# Patient Record
Sex: Male | Born: 1950
Health system: Southern US, Community
[De-identification: ages and names within clinical notes are randomized; demographics above are authoritative.]

## PROBLEM LIST (undated history)

## (undated) DIAGNOSIS — I2510 Atherosclerotic heart disease of native coronary artery without angina pectoris: Secondary | ICD-10-CM

## (undated) DIAGNOSIS — H35039 Hypertensive retinopathy, unspecified eye: Secondary | ICD-10-CM

## (undated) DIAGNOSIS — I10 Essential (primary) hypertension: Secondary | ICD-10-CM

## (undated) DIAGNOSIS — R0600 Dyspnea, unspecified: Secondary | ICD-10-CM

## (undated) DIAGNOSIS — C801 Malignant (primary) neoplasm, unspecified: Secondary | ICD-10-CM

## (undated) DIAGNOSIS — J189 Pneumonia, unspecified organism: Secondary | ICD-10-CM

## (undated) DIAGNOSIS — Z87442 Personal history of urinary calculi: Secondary | ICD-10-CM

## (undated) DIAGNOSIS — M1990 Unspecified osteoarthritis, unspecified site: Secondary | ICD-10-CM

## (undated) DIAGNOSIS — F419 Anxiety disorder, unspecified: Secondary | ICD-10-CM

## (undated) DIAGNOSIS — E119 Type 2 diabetes mellitus without complications: Secondary | ICD-10-CM

## (undated) DIAGNOSIS — I219 Acute myocardial infarction, unspecified: Secondary | ICD-10-CM

## (undated) HISTORY — DX: Unspecified osteoarthritis, unspecified site: M19.90

## (undated) HISTORY — PX: EYE SURGERY: SHX253

## (undated) HISTORY — PX: CATARACT EXTRACTION: SUR2

## (undated) HISTORY — PX: CHOLECYSTECTOMY: SHX55

## (undated) HISTORY — PX: OTHER SURGICAL HISTORY: SHX169

## (undated) HISTORY — DX: Type 2 diabetes mellitus without complications: E11.9

## (undated) HISTORY — DX: Hypertensive retinopathy, unspecified eye: H35.039

## (undated) HISTORY — PX: GALLBLADDER SURGERY: SHX652

## (undated) HISTORY — DX: Anxiety disorder, unspecified: F41.9

## (undated) HISTORY — PX: FOOT SURGERY: SHX648

## (undated) HISTORY — DX: Essential (primary) hypertension: I10

## (undated) SURGERY — AMPUTATION, FOOT, PARTIAL
Anesthesia: Monitor Anesthesia Care | Site: Foot | Laterality: Left

---

## 2006-01-21 ENCOUNTER — Ambulatory Visit: Payer: Self-pay | Admitting: Family Medicine

## 2006-02-04 DIAGNOSIS — I219 Acute myocardial infarction, unspecified: Secondary | ICD-10-CM

## 2006-02-04 HISTORY — PX: CORONARY ANGIOPLASTY WITH STENT PLACEMENT: SHX49

## 2006-02-04 HISTORY — DX: Acute myocardial infarction, unspecified: I21.9

## 2006-07-04 ENCOUNTER — Ambulatory Visit: Payer: Self-pay

## 2006-10-23 ENCOUNTER — Other Ambulatory Visit: Payer: Self-pay

## 2006-10-23 ENCOUNTER — Inpatient Hospital Stay: Payer: Self-pay | Admitting: Internal Medicine

## 2006-10-24 ENCOUNTER — Other Ambulatory Visit: Payer: Self-pay

## 2008-06-22 ENCOUNTER — Other Ambulatory Visit: Payer: Self-pay

## 2009-05-10 ENCOUNTER — Ambulatory Visit: Payer: Self-pay | Admitting: Cardiology

## 2010-01-03 ENCOUNTER — Ambulatory Visit: Payer: Self-pay | Admitting: General Practice

## 2010-08-23 ENCOUNTER — Ambulatory Visit: Payer: Self-pay | Admitting: Anesthesiology

## 2011-04-24 ENCOUNTER — Ambulatory Visit: Payer: Self-pay | Admitting: Family Medicine

## 2011-06-17 ENCOUNTER — Ambulatory Visit: Payer: Self-pay | Admitting: Orthopedic Surgery

## 2011-06-20 ENCOUNTER — Ambulatory Visit: Payer: Self-pay | Admitting: Orthopedic Surgery

## 2011-06-20 LAB — CBC WITH DIFFERENTIAL/PLATELET
Basophil #: 0.1 10*3/uL (ref 0.0–0.1)
Basophil %: 0.9 %
Eosinophil #: 0.3 10*3/uL (ref 0.0–0.7)
Eosinophil %: 4.5 %
HCT: 42.8 % (ref 40.0–52.0)
HGB: 14.9 g/dL (ref 13.0–18.0)
Lymphocyte #: 2 10*3/uL (ref 1.0–3.6)
Lymphocyte %: 35.9 %
MCH: 32.5 pg (ref 26.0–34.0)
MCHC: 34.8 g/dL (ref 32.0–36.0)
MCV: 93 fL (ref 80–100)
Monocyte #: 0.6 x10 3/mm (ref 0.2–1.0)
Monocyte %: 10.6 %
Neutrophil #: 2.7 10*3/uL (ref 1.4–6.5)
Neutrophil %: 48.1 %
Platelet: 170 10*3/uL (ref 150–440)
RBC: 4.58 10*6/uL (ref 4.40–5.90)
RDW: 13.2 % (ref 11.5–14.5)
WBC: 5.6 10*3/uL (ref 3.8–10.6)

## 2011-06-20 LAB — BASIC METABOLIC PANEL
Anion Gap: 7 (ref 7–16)
BUN: 12 mg/dL (ref 7–18)
Calcium, Total: 8.8 mg/dL (ref 8.5–10.1)
Chloride: 102 mmol/L (ref 98–107)
Co2: 30 mmol/L (ref 21–32)
Creatinine: 0.96 mg/dL (ref 0.60–1.30)
EGFR (African American): >60
EGFR (Non-African Amer.): >60
Glucose: 241 mg/dL — ABNORMAL HIGH (ref 65–99)
Osmolality: 285 (ref 275–301)
Potassium: 4.5 mmol/L (ref 3.5–5.1)
Sodium: 139 mmol/L (ref 136–145)

## 2011-06-27 ENCOUNTER — Ambulatory Visit: Payer: Self-pay | Admitting: Orthopedic Surgery

## 2011-07-22 ENCOUNTER — Encounter: Payer: Self-pay | Admitting: Orthopedic Surgery

## 2011-08-05 ENCOUNTER — Encounter: Payer: Self-pay | Admitting: Orthopedic Surgery

## 2011-09-19 ENCOUNTER — Other Ambulatory Visit: Payer: Self-pay

## 2011-09-19 LAB — LIPID PANEL
Cholesterol: 165 mg/dL (ref 0–200)
HDL Cholesterol: 33 mg/dL — ABNORMAL LOW (ref 40–60)
Ldl Cholesterol, Calc: 79 mg/dL (ref 0–100)
Triglycerides: 265 mg/dL — ABNORMAL HIGH (ref 0–200)
VLDL Cholesterol, Calc: 53 mg/dL — ABNORMAL HIGH (ref 5–40)

## 2011-09-19 LAB — COMPREHENSIVE METABOLIC PANEL
Albumin: 3.9 g/dL (ref 3.4–5.0)
Alkaline Phosphatase: 64 U/L (ref 50–136)
Anion Gap: 9 (ref 7–16)
BUN: 14 mg/dL (ref 7–18)
Bilirubin,Total: 0.5 mg/dL (ref 0.2–1.0)
Calcium, Total: 8.8 mg/dL (ref 8.5–10.1)
Chloride: 101 mmol/L (ref 98–107)
Co2: 26 mmol/L (ref 21–32)
Creatinine: 0.91 mg/dL (ref 0.60–1.30)
EGFR (African American): >60
EGFR (Non-African Amer.): >60
Glucose: 185 mg/dL — ABNORMAL HIGH (ref 65–99)
Osmolality: 277 (ref 275–301)
Potassium: 3.7 mmol/L (ref 3.5–5.1)
SGOT(AST): 30 U/L (ref 15–37)
SGPT (ALT): 53 U/L (ref 12–78)
Sodium: 136 mmol/L (ref 136–145)
Total Protein: 7 g/dL (ref 6.4–8.2)

## 2011-09-19 LAB — HEMOGLOBIN A1C: Hemoglobin A1C: 8.3 % — ABNORMAL HIGH (ref 4.2–6.3)

## 2012-07-27 ENCOUNTER — Other Ambulatory Visit: Payer: Self-pay | Admitting: Family Medicine

## 2012-07-27 LAB — LIPID PANEL
Cholesterol: 153 mg/dL (ref 0–200)
HDL Cholesterol: 34 mg/dL — ABNORMAL LOW (ref 40–60)
Ldl Cholesterol, Calc: 77 mg/dL (ref 0–100)
Triglycerides: 212 mg/dL — ABNORMAL HIGH (ref 0–200)
VLDL Cholesterol, Calc: 42 mg/dL — ABNORMAL HIGH (ref 5–40)

## 2012-07-27 LAB — HEMOGLOBIN A1C: Hemoglobin A1C: 7.6 % — ABNORMAL HIGH (ref 4.2–6.3)

## 2012-11-17 ENCOUNTER — Encounter: Payer: Self-pay | Admitting: *Deleted

## 2012-11-19 ENCOUNTER — Ambulatory Visit (INDEPENDENT_AMBULATORY_CARE_PROVIDER_SITE_OTHER): Payer: BC Managed Care – PPO

## 2012-11-19 ENCOUNTER — Encounter: Payer: Self-pay | Admitting: Podiatry

## 2012-11-19 ENCOUNTER — Ambulatory Visit (INDEPENDENT_AMBULATORY_CARE_PROVIDER_SITE_OTHER): Payer: BC Managed Care – PPO | Admitting: Podiatry

## 2012-11-19 VITALS — BP 145/84 | HR 63 | Resp 16 | Ht 75.0 in | Wt 270.0 lb

## 2012-11-19 DIAGNOSIS — M2030 Hallux varus (acquired), unspecified foot: Secondary | ICD-10-CM

## 2012-11-19 DIAGNOSIS — M2040 Other hammer toe(s) (acquired), unspecified foot: Secondary | ICD-10-CM

## 2012-11-19 DIAGNOSIS — M2032 Hallux varus (acquired), left foot: Secondary | ICD-10-CM

## 2012-11-19 NOTE — Progress Notes (Signed)
Kevin Perry presents today for followup of his surgical foot left date of surgery was 09/04/2012. He states he is doing quite well he is very happy with the outcome. It is able to work with no problems.  Objective: Vital signs are stable he is alert and oriented x3 pulses are palpable left lower extremity. Moderate edema to the left foot. Surgical sites evolving heel. He does have some medial deviation of the second PIPJ resulting in adduction of the second toe toward the hallux. Radiographic evaluation confirms nonunion at the PIPJ arthrodesis site.  Assessment: Well-healing surgical foot left.  Plan: Followup with him in one month for another set of x-rays.

## 2012-12-16 ENCOUNTER — Encounter: Payer: BC Managed Care – PPO | Admitting: Podiatry

## 2012-12-30 ENCOUNTER — Encounter: Payer: BC Managed Care – PPO | Admitting: Podiatry

## 2013-01-13 ENCOUNTER — Ambulatory Visit (INDEPENDENT_AMBULATORY_CARE_PROVIDER_SITE_OTHER): Payer: BC Managed Care – PPO

## 2013-01-13 ENCOUNTER — Ambulatory Visit (INDEPENDENT_AMBULATORY_CARE_PROVIDER_SITE_OTHER): Payer: BC Managed Care – PPO | Admitting: Podiatry

## 2013-01-13 ENCOUNTER — Encounter: Payer: Self-pay | Admitting: Podiatry

## 2013-01-13 VITALS — BP 154/85 | HR 75 | Resp 16 | Ht 75.0 in | Wt 270.0 lb

## 2013-01-13 DIAGNOSIS — Z9889 Other specified postprocedural states: Secondary | ICD-10-CM

## 2013-01-13 NOTE — Progress Notes (Signed)
Kevin Perry presents today for followup of his hallux IPJ fusion left foot as well as hammertoe repairs. He states that they're doing absolutely great he has no problems whatsoever he is concerned about a discoloration the hallux nail left.  Objective: Vital signs are stable he is alert and oriented x3. He has a subungual hematoma to the hallux nail left. Much decrease in edema to the left foot since I saw him last. Radiographic evaluation demonstrates well-healed IPJ fusion left foot.  Assessment: Dermatitis bilateral legs. Well-healing surgical foot left. Subungual hematoma left.  Plan: Debridement of subungual hematoma and nail plate left. Followup with him on an as-needed basis.

## 2014-05-29 NOTE — Op Note (Signed)
PATIENT NAME:  Kevin Perry, Kevin Perry MR#:  [id number] DATE OF BIRTH:  [birth year]  DATE OF PROCEDURE:  06/27/2011  PREOPERATIVE DIAGNOSES:  1. Osteoarthritis.  2. Medial meniscus tear, right knee.   POSTOPERATIVE DIAGNOSIS:  1. Osteoarthritis. 2. Medial meniscus tear, right knee. 3. Lateral meniscus tear.   PROCEDURE: Arthroscopy, partial medial and lateral meniscectomy.   SURGEON: Laurene Footman, MD   ANESTHESIA: General.   DESCRIPTION OF PROCEDURE: The patient was brought to the Operating Room, and after adequate anesthesia was obtained the leg was prepped and draped in the usual sterile fashion with the arthroscopic legholder and tourniquet applied. After appropriate patient identification and timeout procedures were completed, the knee was approached through an inferolateral portal initially and the arthroscope was introduced. Initial inspection revealed normal-appearing patellofemoral tracking with moderate degenerative changes to both the femoral trochlea and patella with moderate synovitis as well anteriorly. This was subsequently resected to aid in visualization. The gutters were checked and there were no loose bodies. Coming around medially, an inferomedial portal was made and a probe introduced. Inspection revealed a complex tear of the posterior horn of the medial meniscus with a predominantly horizontal component. The articular cartilage all had grade 2 and 3 changes but no bone and no fissuring down to bone. The anterior cruciate ligament was noted to be intact. In the lateral compartment, the articular cartilage was relatively spared with just some grade 1 and 2 changes, and there was a tear of the posterior horn, degenerative type tear to the inner third of the posterior and middle thirds. An ArthroCare wand was used to address the lateral meniscus tear medially. A shaver was used to resect some of the synovitis for better visualization as well as a large portion of the posterior horn  tear. On probing, there was found to be a piece of meniscus approximately 1 cm to 1.5 cm in length that  had been flipped under the meniscus. It had a rounded stump consistent with a long-term tear that was probably displaceable in the knee. This was resected with the shaver, and on further probing there were no more large tears. The ArthroCare wand was used to smooth the edges. Most of the posterior horn was resected to get back to a stable margin. The knee was then thoroughly irrigated until clear. All instrumentation was withdrawn. Xeroform, 4 x 4s, Webril, and Ace wrap were applied after infiltration of a total of 20 mL of 0.5% Sensorcaine with epi into the area of the portals.   ESTIMATED BLOOD LOSS: Minimal.   COMPLICATIONS: None.   SPECIMEN: None.    ____________________________ Laurene Footman, MD mjm:cbb D: 06/27/2011 21:11:51 ET T: 06/28/2011 11:09:47 ET JOB#: [id number]  cc: Laurene Footman, MD, <Dictator> Laurene Footman MD ELECTRONICALLY SIGNED 06/28/2011 12:34

## 2014-07-07 ENCOUNTER — Telehealth: Payer: Self-pay

## 2014-07-07 NOTE — Telephone Encounter (Signed)
Printed labs and notified patient. Kevin Perry CMA

## 2014-07-07 NOTE — Telephone Encounter (Signed)
-----   Message from Roselee Nova, MD sent at 07/06/2014  6:34 PM EDT ----- Regarding: Lab results. CMP is normal.  FLP is at goal except for HDl, which is below normal at 37.  HgbA1c is elevated at 7.5%. He should improve his dietary and lifestyle factors and repeat A1c in 3 months.

## 2014-07-12 ENCOUNTER — Other Ambulatory Visit: Payer: Self-pay | Admitting: Orthopedic Surgery

## 2014-07-12 DIAGNOSIS — M1711 Unilateral primary osteoarthritis, right knee: Secondary | ICD-10-CM

## 2014-07-18 ENCOUNTER — Ambulatory Visit
Admission: RE | Admit: 2014-07-18 | Discharge: 2014-07-18 | Disposition: A | Discharge status: Home / Self Care | Payer: Managed Care, Other (non HMO) | Source: Ambulatory Visit | Attending: Orthopedic Surgery | Admitting: Orthopedic Surgery

## 2014-07-18 DIAGNOSIS — M25761 Osteophyte, right knee: Secondary | ICD-10-CM | POA: Insufficient documentation

## 2014-07-18 DIAGNOSIS — M25461 Effusion, right knee: Secondary | ICD-10-CM | POA: Insufficient documentation

## 2014-07-18 DIAGNOSIS — M1711 Unilateral primary osteoarthritis, right knee: Secondary | ICD-10-CM | POA: Diagnosis present

## 2014-08-17 ENCOUNTER — Encounter
Admission: RE | Admit: 2014-08-17 | Discharge: 2014-08-17 | Disposition: A | Discharge status: Home / Self Care | Payer: Managed Care, Other (non HMO) | Source: Ambulatory Visit | Attending: Orthopedic Surgery | Admitting: Orthopedic Surgery

## 2014-08-17 ENCOUNTER — Other Ambulatory Visit: Payer: Managed Care, Other (non HMO)

## 2014-08-17 DIAGNOSIS — E119 Type 2 diabetes mellitus without complications: Secondary | ICD-10-CM | POA: Diagnosis not present

## 2014-08-17 DIAGNOSIS — Z01810 Encounter for preprocedural cardiovascular examination: Secondary | ICD-10-CM | POA: Diagnosis present

## 2014-08-17 DIAGNOSIS — F419 Anxiety disorder, unspecified: Secondary | ICD-10-CM | POA: Diagnosis not present

## 2014-08-17 DIAGNOSIS — Z888 Allergy status to other drugs, medicaments and biological substances status: Secondary | ICD-10-CM | POA: Diagnosis not present

## 2014-08-17 DIAGNOSIS — M1990 Unspecified osteoarthritis, unspecified site: Secondary | ICD-10-CM | POA: Insufficient documentation

## 2014-08-17 DIAGNOSIS — I10 Essential (primary) hypertension: Secondary | ICD-10-CM | POA: Insufficient documentation

## 2014-08-17 DIAGNOSIS — I252 Old myocardial infarction: Secondary | ICD-10-CM | POA: Diagnosis not present

## 2014-08-17 DIAGNOSIS — Z01812 Encounter for preprocedural laboratory examination: Secondary | ICD-10-CM | POA: Diagnosis present

## 2014-08-17 HISTORY — DX: Acute myocardial infarction, unspecified: I21.9

## 2014-08-17 LAB — URINALYSIS COMPLETE WITH MICROSCOPIC (ARMC ONLY)
Bacteria, UA: NONE SEEN
Bilirubin Urine: NEGATIVE
Glucose, UA: NEGATIVE mg/dL
Hgb urine dipstick: NEGATIVE
Ketones, ur: NEGATIVE mg/dL
Leukocytes, UA: NEGATIVE
Nitrite: NEGATIVE
Protein, ur: NEGATIVE mg/dL
Specific Gravity, Urine: 1.016 (ref 1.005–1.030)
pH: 6 (ref 5.0–8.0)

## 2014-08-17 LAB — CBC
HCT: 43.3 % (ref 40.0–52.0)
Hemoglobin: 14.9 g/dL (ref 13.0–18.0)
MCH: 31.3 pg (ref 26.0–34.0)
MCHC: 34.5 g/dL (ref 32.0–36.0)
MCV: 90.5 fL (ref 80.0–100.0)
Platelets: 181 10*3/uL (ref 150–440)
RBC: 4.78 MIL/uL (ref 4.40–5.90)
RDW: 13 % (ref 11.5–14.5)
WBC: 7.6 10*3/uL (ref 3.8–10.6)

## 2014-08-17 LAB — APTT: aPTT: 27 seconds (ref 24–36)

## 2014-08-17 LAB — TYPE AND SCREEN
ABO/RH(D): A POS
Antibody Screen: NEGATIVE

## 2014-08-17 LAB — SEDIMENTATION RATE: Sed Rate: 6 mm/hr (ref 0–20)

## 2014-08-17 LAB — BASIC METABOLIC PANEL
Anion gap: 7 (ref 5–15)
BUN: 17 mg/dL (ref 6–20)
CO2: 28 mmol/L (ref 22–32)
Calcium: 9 mg/dL (ref 8.9–10.3)
Chloride: 102 mmol/L (ref 101–111)
Creatinine, Ser: 0.97 mg/dL (ref 0.61–1.24)
GFR calc Af Amer: >60 mL/min (ref >60)
GFR calc non Af Amer: >60 mL/min (ref >60)
Glucose, Bld: 164 mg/dL — ABNORMAL HIGH (ref 65–99)
Potassium: 3.7 mmol/L (ref 3.5–5.1)
Sodium: 137 mmol/L (ref 135–145)

## 2014-08-17 LAB — PROTIME-INR
INR: 0.96
Prothrombin Time: 13 seconds (ref 11.4–15.0)

## 2014-08-17 LAB — SURGICAL PCR SCREEN
MRSA, PCR: NEGATIVE
Staphylococcus aureus: POSITIVE — AB

## 2014-08-17 NOTE — Patient Instructions (Addendum)
  Your procedure is scheduled on: Tuesday August 30, 2014. Report to Same Day Surgery. To find out your arrival time please call [phone] between 1PM - 3PM on Monday August 29, 2014.  Remember: Instructions that are not followed completely may result in serious medical risk, up to and including death, or upon the discretion of your surgeon and anesthesiologist your surgery may need to be rescheduled.    __x__ 1. Do not eat food or drink liquids after midnight. No gum chewing or hard candies.     ____ 2. No Alcohol for 24 hours before or after surgery.   ____ 3. Bring all medications with you on the day of surgery if instructed.    __x__ 4. Notify your doctor if there is any change in your medical condition     (cold, fever, infections).     Do not wear jewelry, make-up, hairpins, clips or nail polish.  Do not wear lotions, powders, or perfumes. You may wear deodorant.  Do not shave 48 hours prior to surgery. Men may shave face and neck.  Do not bring valuables to the hospital.    [facility] is not responsible for any belongings or valuables.               Contacts, dentures or bridgework may not be worn into surgery.  Leave your suitcase in the car. After surgery it may be brought to your room.  For patients admitted to the hospital, discharge time is determined by your treatment team.   Patients discharged the day of surgery will not be allowed to drive home.    Please read over the following fact sheets that you were given:   [facility] Preparing for Surgery  __x__ Take these medicines the morning of surgery with A SIP OF WATER:    1. amLODipine (NORVASC)  2. citalopram (CELEXA)   3. lisinopril (PRINIVIL,ZESTRIL)  4. metoprolol succinate (TOPROL-XL  5.rosuvastatin (CRESTOR)     ____ Fleet Enema (as directed)   _x___ Use CHG Soap as directed  ____ Use inhalers on the day of surgery  ____ Stop metformin 2 days prior to surgery    ____ Take 1/2 of usual insulin dose  the night before surgery and none on the morning of surgery.   ____ Stop Coumadin/Plavix/aspirin on does NOT apply.  ____ Stop Anti-inflammatories on does NOT apply.  Tylenol or Hydrocodone is ok to take for pain.   ____ Stop supplements until after surgery.    ____ Bring C-Pap to the hospital.

## 2014-08-18 LAB — ABO/RH: ABO/RH(D): A POS

## 2014-08-18 NOTE — Pre-Procedure Instructions (Signed)
Phone call from Dr. [name] office nurse Margaretha Sheffield, no treatment ordered by Dr. Rudene Christians for + Staph Aureus.

## 2014-08-18 NOTE — Pre-Procedure Instructions (Signed)
Message left for nurse Margaretha Sheffield at Dr. Rudene Christians office regarding MRSA negative/ Staph aureus positive result and to see if additional treatment is indicated.

## 2014-08-30 ENCOUNTER — Inpatient Hospital Stay
Admission: RE | Admit: 2014-08-30 | Discharge: 2014-09-02 | DRG: 470 | Disposition: A | Discharge status: Home Health | Payer: Managed Care, Other (non HMO) | Source: Ambulatory Visit | Attending: Orthopedic Surgery | Admitting: Orthopedic Surgery

## 2014-08-30 ENCOUNTER — Encounter: Payer: Self-pay | Admitting: Anesthesiology

## 2014-08-30 ENCOUNTER — Inpatient Hospital Stay: Payer: Managed Care, Other (non HMO) | Admitting: Anesthesiology

## 2014-08-30 ENCOUNTER — Encounter
Admission: RE | Disposition: A | Discharge status: Home Health | Payer: Self-pay | Source: Ambulatory Visit | Attending: Orthopedic Surgery

## 2014-08-30 ENCOUNTER — Inpatient Hospital Stay: Payer: Managed Care, Other (non HMO)

## 2014-08-30 DIAGNOSIS — F329 Major depressive disorder, single episode, unspecified: Secondary | ICD-10-CM | POA: Diagnosis present

## 2014-08-30 DIAGNOSIS — Z955 Presence of coronary angioplasty implant and graft: Secondary | ICD-10-CM | POA: Diagnosis not present

## 2014-08-30 DIAGNOSIS — I252 Old myocardial infarction: Secondary | ICD-10-CM | POA: Diagnosis not present

## 2014-08-30 DIAGNOSIS — J302 Other seasonal allergic rhinitis: Secondary | ICD-10-CM | POA: Diagnosis present

## 2014-08-30 DIAGNOSIS — Z79891 Long term (current) use of opiate analgesic: Secondary | ICD-10-CM | POA: Diagnosis not present

## 2014-08-30 DIAGNOSIS — M1711 Unilateral primary osteoarthritis, right knee: Principal | ICD-10-CM | POA: Diagnosis present

## 2014-08-30 DIAGNOSIS — Z886 Allergy status to analgesic agent status: Secondary | ICD-10-CM | POA: Diagnosis not present

## 2014-08-30 DIAGNOSIS — F419 Anxiety disorder, unspecified: Secondary | ICD-10-CM | POA: Diagnosis present

## 2014-08-30 DIAGNOSIS — Z79899 Other long term (current) drug therapy: Secondary | ICD-10-CM

## 2014-08-30 DIAGNOSIS — G8918 Other acute postprocedural pain: Secondary | ICD-10-CM

## 2014-08-30 DIAGNOSIS — E119 Type 2 diabetes mellitus without complications: Secondary | ICD-10-CM | POA: Diagnosis present

## 2014-08-30 DIAGNOSIS — I10 Essential (primary) hypertension: Secondary | ICD-10-CM | POA: Diagnosis present

## 2014-08-30 DIAGNOSIS — M1710 Unilateral primary osteoarthritis, unspecified knee: Secondary | ICD-10-CM | POA: Diagnosis present

## 2014-08-30 HISTORY — PX: TOTAL KNEE ARTHROPLASTY: SHX125

## 2014-08-30 LAB — PROTIME-INR
INR: 0.95
Prothrombin Time: 12.9 seconds (ref 11.4–15.0)

## 2014-08-30 LAB — CBC
HCT: 39.8 % — ABNORMAL LOW (ref 40.0–52.0)
Hemoglobin: 13.6 g/dL (ref 13.0–18.0)
MCH: 31.2 pg (ref 26.0–34.0)
MCHC: 34.1 g/dL (ref 32.0–36.0)
MCV: 91.4 fL (ref 80.0–100.0)
Platelets: 150 10*3/uL (ref 150–440)
RBC: 4.35 MIL/uL — ABNORMAL LOW (ref 4.40–5.90)
RDW: 13.7 % (ref 11.5–14.5)
WBC: 7.4 10*3/uL (ref 3.8–10.6)

## 2014-08-30 LAB — CREATININE, SERUM
Creatinine, Ser: 0.85 mg/dL (ref 0.61–1.24)
GFR calc Af Amer: >60 mL/min (ref >60)
GFR calc non Af Amer: >60 mL/min (ref >60)

## 2014-08-30 LAB — GLUCOSE, CAPILLARY: Glucose-Capillary: 182 mg/dL — ABNORMAL HIGH (ref 65–99)

## 2014-08-30 SURGERY — ARTHROPLASTY, KNEE, TOTAL
Anesthesia: Spinal | Site: Knee | Laterality: Right | Wound class: Clean

## 2014-08-30 MED ORDER — BUPIVACAINE HCL (PF) 0.5 % IJ SOLN
INTRAMUSCULAR | Status: DC | PRN
  Administered 2014-08-30: 3 mL

## 2014-08-30 MED ORDER — FAMOTIDINE 20 MG PO TABS
ORAL_TABLET | ORAL | Status: AC
  Administered 2014-08-30: 20 mg via ORAL
  Filled 2014-08-30: qty 1

## 2014-08-30 MED ORDER — EPHEDRINE SULFATE 50 MG/ML IJ SOLN
INTRAMUSCULAR | Status: DC | PRN
  Administered 2014-08-30: 10 mg via INTRAVENOUS

## 2014-08-30 MED ORDER — ONDANSETRON HCL 4 MG PO TABS: 4.0000 mg | ORAL_TABLET | Freq: Four times a day (QID) | ORAL | Status: DC | PRN

## 2014-08-30 MED ORDER — METOCLOPRAMIDE HCL 5 MG/ML IJ SOLN: 5.0000 mg | Freq: Three times a day (TID) | INTRAMUSCULAR | Status: DC | PRN

## 2014-08-30 MED ORDER — MENTHOL 3 MG MT LOZG: 1.0000 | LOZENGE | OROMUCOSAL | Status: DC | PRN

## 2014-08-30 MED ORDER — NEOMYCIN-POLYMYXIN B GU 40-200000 IR SOLN
Status: DC | PRN
  Administered 2014-08-30: 16 mL

## 2014-08-30 MED ORDER — CITALOPRAM HYDROBROMIDE 20 MG PO TABS
20.0000 mg | ORAL_TABLET | ORAL | Status: DC
  Administered 2014-08-31 – 2014-09-02 (×3): 20 mg via ORAL
  Filled 2014-08-30 (×4): qty 1

## 2014-08-30 MED ORDER — MORPHINE (PF) INJECTION FOR INHALATION 10 MG/ML
RESPIRATORY_TRACT | Status: DC | PRN
  Administered 2014-08-30: 10 mg via RESPIRATORY_TRACT

## 2014-08-30 MED ORDER — SODIUM CHLORIDE 0.9 % IJ SOLN
INTRAMUSCULAR | Status: AC
  Filled 2014-08-30: qty 100

## 2014-08-30 MED ORDER — HYDROCODONE-ACETAMINOPHEN 10-325 MG PO TABS
1.0000 | ORAL_TABLET | Freq: Four times a day (QID) | ORAL | Status: DC | PRN
  Administered 2014-08-30: 1 via ORAL
  Filled 2014-08-30: qty 1

## 2014-08-30 MED ORDER — PROPOFOL 10 MG/ML IV BOLUS
INTRAVENOUS | Status: DC | PRN
  Administered 2014-08-30: 70 ug via INTRAVENOUS

## 2014-08-30 MED ORDER — HYDROMORPHONE HCL 1 MG/ML IJ SOLN
0.5000 mg | Freq: Once | INTRAMUSCULAR | Status: AC
  Administered 2014-08-30: 0.5 mg via INTRAVENOUS
  Filled 2014-08-30: qty 1

## 2014-08-30 MED ORDER — METHOCARBAMOL 1000 MG/10ML IJ SOLN
500.0000 mg | Freq: Four times a day (QID) | INTRAVENOUS | Status: DC | PRN
  Filled 2014-08-30: qty 5

## 2014-08-30 MED ORDER — METHOCARBAMOL 500 MG PO TABS
500.0000 mg | ORAL_TABLET | Freq: Four times a day (QID) | ORAL | Status: DC | PRN
  Administered 2014-08-30 – 2014-09-02 (×2): 500 mg via ORAL
  Filled 2014-08-30 (×2): qty 1

## 2014-08-30 MED ORDER — PROPOFOL INFUSION 10 MG/ML OPTIME
INTRAVENOUS | Status: DC | PRN
  Administered 2014-08-30: 100 ug/kg/min via INTRAVENOUS

## 2014-08-30 MED ORDER — ONDANSETRON HCL 4 MG/2ML IJ SOLN: 4.0000 mg | Freq: Once | INTRAMUSCULAR | Status: DC | PRN

## 2014-08-30 MED ORDER — DOCUSATE SODIUM 100 MG PO CAPS
100.0000 mg | ORAL_CAPSULE | Freq: Two times a day (BID) | ORAL | Status: DC
  Administered 2014-08-30 – 2014-09-01 (×6): 100 mg via ORAL
  Filled 2014-08-30 (×6): qty 1

## 2014-08-30 MED ORDER — ACETAMINOPHEN 325 MG PO TABS: 650.0000 mg | ORAL_TABLET | Freq: Four times a day (QID) | ORAL | Status: DC | PRN

## 2014-08-30 MED ORDER — FENTANYL CITRATE (PF) 100 MCG/2ML IJ SOLN
INTRAMUSCULAR | Status: DC | PRN
  Administered 2014-08-30: 100 ug via INTRAVENOUS

## 2014-08-30 MED ORDER — SODIUM CHLORIDE 0.9 % IV SOLN
INTRAVENOUS | Status: DC
Start: 2014-08-30 — End: 2014-08-30
  Administered 2014-08-30 (×3): via INTRAVENOUS

## 2014-08-30 MED ORDER — LINAGLIPTIN 5 MG PO TABS
5.0000 mg | ORAL_TABLET | Freq: Every day | ORAL | Status: DC
  Administered 2014-08-31 – 2014-09-01 (×2): 5 mg via ORAL
  Filled 2014-08-30 (×2): qty 1

## 2014-08-30 MED ORDER — ALUM & MAG HYDROXIDE-SIMETH 200-200-20 MG/5ML PO SUSP: 30.0000 mL | ORAL | Status: DC | PRN

## 2014-08-30 MED ORDER — CEFAZOLIN SODIUM 10 G IJ SOLR
3.0000 g | Freq: Once | INTRAMUSCULAR | Status: AC
  Administered 2014-08-30: 3 g via INTRAVENOUS
  Filled 2014-08-30: qty 3000

## 2014-08-30 MED ORDER — FENTANYL CITRATE (PF) 100 MCG/2ML IJ SOLN: 25.0000 ug | INTRAMUSCULAR | Status: DC | PRN

## 2014-08-30 MED ORDER — TRANEXAMIC ACID 1000 MG/10ML IV SOLN
INTRAVENOUS | Status: AC
  Filled 2014-08-30: qty 10

## 2014-08-30 MED ORDER — FUROSEMIDE 40 MG PO TABS
40.0000 mg | ORAL_TABLET | ORAL | Status: DC
  Administered 2014-08-30 – 2014-09-02 (×4): 40 mg via ORAL
  Filled 2014-08-30 (×5): qty 1

## 2014-08-30 MED ORDER — ZOLPIDEM TARTRATE 5 MG PO TABS: 5.0000 mg | ORAL_TABLET | Freq: Every evening | ORAL | Status: DC | PRN

## 2014-08-30 MED ORDER — ROSUVASTATIN CALCIUM 10 MG PO TABS
10.0000 mg | ORAL_TABLET | ORAL | Status: DC
  Administered 2014-08-31 – 2014-09-02 (×3): 10 mg via ORAL
  Filled 2014-08-30 (×4): qty 1

## 2014-08-30 MED ORDER — ONDANSETRON HCL 4 MG/2ML IJ SOLN: 4.0000 mg | Freq: Four times a day (QID) | INTRAMUSCULAR | Status: DC | PRN

## 2014-08-30 MED ORDER — BISACODYL 10 MG RE SUPP: 10.0000 mg | Freq: Every day | RECTAL | Status: DC | PRN

## 2014-08-30 MED ORDER — OXYCODONE HCL 5 MG PO TABS
5.0000 mg | ORAL_TABLET | ORAL | Status: DC | PRN
  Administered 2014-08-30 (×2): 5 mg via ORAL
  Administered 2014-08-30 – 2014-09-02 (×8): 10 mg via ORAL
  Filled 2014-08-30 (×2): qty 1
  Filled 2014-08-30 (×9): qty 2

## 2014-08-30 MED ORDER — ENOXAPARIN SODIUM 30 MG/0.3ML ~~LOC~~ SOLN
30.0000 mg | Freq: Two times a day (BID) | SUBCUTANEOUS | Status: DC
  Administered 2014-08-31 – 2014-09-02 (×5): 30 mg via SUBCUTANEOUS
  Filled 2014-08-30 (×6): qty 0.3

## 2014-08-30 MED ORDER — BUPIVACAINE-EPINEPHRINE (PF) 0.25% -1:200000 IJ SOLN
INTRAMUSCULAR | Status: DC | PRN
  Administered 2014-08-30: 30 mL via PERINEURAL

## 2014-08-30 MED ORDER — FAMOTIDINE 20 MG PO TABS
20.0000 mg | ORAL_TABLET | Freq: Once | ORAL | Status: AC
  Administered 2014-08-30: 20 mg via ORAL

## 2014-08-30 MED ORDER — SODIUM CHLORIDE 0.9 % IV SOLN
INTRAVENOUS | Status: DC | PRN
  Administered 2014-08-30: 60 mL

## 2014-08-30 MED ORDER — NEOMYCIN-POLYMYXIN B GU 40-200000 IR SOLN
Status: AC
  Filled 2014-08-30: qty 20

## 2014-08-30 MED ORDER — AMLODIPINE BESYLATE 10 MG PO TABS
10.0000 mg | ORAL_TABLET | ORAL | Status: DC
  Administered 2014-08-31 – 2014-09-02 (×3): 10 mg via ORAL
  Filled 2014-08-30 (×4): qty 1

## 2014-08-30 MED ORDER — SODIUM CHLORIDE 0.9 % IJ SOLN
INTRAMUSCULAR | Status: AC
  Administered 2014-08-30: 10 mL
  Filled 2014-08-30: qty 3

## 2014-08-30 MED ORDER — LISINOPRIL 20 MG PO TABS
20.0000 mg | ORAL_TABLET | ORAL | Status: DC
Start: 2014-08-30 — End: 2014-09-02
  Administered 2014-08-31 – 2014-09-02 (×3): 20 mg via ORAL
  Filled 2014-08-30 (×4): qty 1

## 2014-08-30 MED ORDER — MAGNESIUM HYDROXIDE 400 MG/5ML PO SUSP
30.0000 mL | Freq: Every day | ORAL | Status: DC | PRN
  Administered 2014-09-01: 30 mL via ORAL
  Filled 2014-08-30: qty 30

## 2014-08-30 MED ORDER — PHENOL 1.4 % MT LIQD: 1.0000 | OROMUCOSAL | Status: DC | PRN

## 2014-08-30 MED ORDER — MAGNESIUM CITRATE PO SOLN: 1.0000 | Freq: Once | ORAL | Status: AC | PRN

## 2014-08-30 MED ORDER — ACETAMINOPHEN 650 MG RE SUPP: 650.0000 mg | Freq: Four times a day (QID) | RECTAL | Status: DC | PRN

## 2014-08-30 MED ORDER — DIPHENHYDRAMINE HCL 12.5 MG/5ML PO ELIX: 12.5000 mg | ORAL_SOLUTION | ORAL | Status: DC | PRN

## 2014-08-30 MED ORDER — LORATADINE 10 MG PO TABS
10.0000 mg | ORAL_TABLET | ORAL | Status: DC
  Administered 2014-08-31 – 2014-09-02 (×3): 10 mg via ORAL
  Filled 2014-08-30 (×4): qty 1

## 2014-08-30 MED ORDER — MIDAZOLAM HCL 2 MG/2ML IJ SOLN
INTRAMUSCULAR | Status: DC | PRN
  Administered 2014-08-30: 2 mg via INTRAVENOUS

## 2014-08-30 MED ORDER — MORPHINE SULFATE 2 MG/ML IJ SOLN
2.0000 mg | INTRAMUSCULAR | Status: DC | PRN
  Administered 2014-08-30 (×6): 2 mg via INTRAVENOUS
  Filled 2014-08-30 (×6): qty 1

## 2014-08-30 MED ORDER — ACETAMINOPHEN 10 MG/ML IV SOLN
INTRAVENOUS | Status: AC
  Filled 2014-08-30: qty 100

## 2014-08-30 MED ORDER — GLIPIZIDE 5 MG PO TABS
10.0000 mg | ORAL_TABLET | Freq: Two times a day (BID) | ORAL | Status: DC
  Administered 2014-08-30 – 2014-09-02 (×6): 10 mg via ORAL
  Filled 2014-08-30 (×6): qty 2

## 2014-08-30 MED ORDER — CEFAZOLIN SODIUM-DEXTROSE 2-3 GM-% IV SOLR
2.0000 g | Freq: Four times a day (QID) | INTRAVENOUS | Status: AC
  Administered 2014-08-30 (×3): 2 g via INTRAVENOUS
  Filled 2014-08-30 (×3): qty 50

## 2014-08-30 MED ORDER — SODIUM CHLORIDE 0.9 % IV SOLN
INTRAVENOUS | Status: DC
  Administered 2014-08-30 – 2014-08-31 (×3): via INTRAVENOUS

## 2014-08-30 MED ORDER — MORPHINE SULFATE 10 MG/ML IJ SOLN
INTRAMUSCULAR | Status: AC
  Filled 2014-08-30: qty 1

## 2014-08-30 MED ORDER — METOCLOPRAMIDE HCL 10 MG PO TABS: 5.0000 mg | ORAL_TABLET | Freq: Three times a day (TID) | ORAL | Status: DC | PRN

## 2014-08-30 MED ORDER — BUPIVACAINE LIPOSOME 1.3 % IJ SUSP
INTRAMUSCULAR | Status: AC
  Filled 2014-08-30: qty 20

## 2014-08-30 MED ORDER — BUPIVACAINE-EPINEPHRINE (PF) 0.25% -1:200000 IJ SOLN
INTRAMUSCULAR | Status: AC
  Filled 2014-08-30: qty 30

## 2014-08-30 MED ORDER — METOPROLOL SUCCINATE ER 50 MG PO TB24
50.0000 mg | ORAL_TABLET | ORAL | Status: DC
  Administered 2014-08-31 – 2014-09-02 (×3): 50 mg via ORAL
  Filled 2014-08-30 (×4): qty 1

## 2014-08-30 MED ORDER — ACETAMINOPHEN 10 MG/ML IV SOLN
INTRAVENOUS | Status: DC | PRN
Start: 2014-08-30 — End: 2014-08-30
  Administered 2014-08-30: 1000 mg via INTRAVENOUS

## 2014-08-30 SURGICAL SUPPLY — 55 items
BANDAGE ELASTIC 6 CLIP ST LF (GAUZE/BANDAGES/DRESSINGS) ×2 IMPLANT
BLADE SAW 1 (BLADE) ×2 IMPLANT
BLOCK CUTTING FEMUR 6 RT (MISCELLANEOUS) IMPLANT
BONE CEMENT GENTAMICIN (Cement) ×4 IMPLANT
CANISTER SUCT 1200ML W/VALVE (MISCELLANEOUS) ×2 IMPLANT
CANISTER SUCT 3000ML (MISCELLANEOUS) ×4 IMPLANT
CAPT KNEE TOTAL 3 ×2 IMPLANT
CATH FOL LEG HOLDER (MISCELLANEOUS) ×2 IMPLANT
CATH TRAY 16F METER LATEX (MISCELLANEOUS) ×2 IMPLANT
CEMENT BONE GENTAMICIN 40 (Cement) ×2 IMPLANT
CHLORAPREP W/TINT 26ML (MISCELLANEOUS) ×4 IMPLANT
COOLER POLAR GLACIER W/PUMP (MISCELLANEOUS) ×2 IMPLANT
DRAPE INCISE IOBAN 66X45 STRL (DRAPES) ×4 IMPLANT
DRAPE SHEET LG 3/4 BI-LAMINATE (DRAPES) ×4 IMPLANT
ELECT CAUTERY BLADE 6.4 (BLADE) ×2 IMPLANT
GAUZE PETRO XEROFOAM 1X8 (MISCELLANEOUS) ×2 IMPLANT
GAUZE SPONGE 4X4 12PLY STRL (GAUZE/BANDAGES/DRESSINGS) ×2 IMPLANT
GLOVE BIOGEL PI IND STRL 9 (GLOVE) ×3 IMPLANT
GLOVE BIOGEL PI INDICATOR 9 (GLOVE) ×3
GLOVE SURG ORTHO 9.0 STRL STRW (GLOVE) ×6 IMPLANT
GOWN SPECIALTY ULTRA XL (MISCELLANEOUS) ×2 IMPLANT
GOWN STRL REUS W/ TWL LRG LVL3 (GOWN DISPOSABLE) ×2 IMPLANT
GOWN STRL REUS W/TWL LRG LVL3 (GOWN DISPOSABLE) ×2
HANDPIECE SUCTION TUBG SURGILV (MISCELLANEOUS) ×2 IMPLANT
HOOD PEEL AWAY FACE SHEILD DIS (HOOD) ×4 IMPLANT
IMMBOLIZER KNEE 19 BLUE UNIV (SOFTGOODS) ×2 IMPLANT
IV SET EXTENSION 6 LL TADAPT (SET/KITS/TRAYS/PACK) ×2 IMPLANT
KNEE MEDACTA TIBIAL/FEMORAL BL (Knees) ×2 IMPLANT
KNIFE SCULPS 14X20 (INSTRUMENTS) ×2 IMPLANT
NDL SAFETY 18GX1.5 (NEEDLE) ×2 IMPLANT
NDL SAFETY ECLIPSE 18X1.5 (NEEDLE) ×1 IMPLANT
NEEDLE HYPO 18GX1.5 SHARP (NEEDLE) ×1
NEEDLE SPNL 18GX3.5 QUINCKE PK (NEEDLE) ×2 IMPLANT
NEEDLE SPNL 20GX3.5 QUINCKE YW (NEEDLE) ×2 IMPLANT
NS IRRIG 1000ML POUR BTL (IV SOLUTION) ×2 IMPLANT
PACK TOTAL KNEE (MISCELLANEOUS) ×2 IMPLANT
PAD GROUND ADULT SPLIT (MISCELLANEOUS) ×2 IMPLANT
PAD WRAPON POLAR KNEE (MISCELLANEOUS) ×1 IMPLANT
SOL .9 NS 3000ML IRR  AL (IV SOLUTION) ×1
SOL .9 NS 3000ML IRR UROMATIC (IV SOLUTION) ×1 IMPLANT
STAPLER SKIN PROX 35W (STAPLE) ×2 IMPLANT
STEM EXTENSION 11MMX30MM (Stem) ×2 IMPLANT
STRAP SAFETY BODY (MISCELLANEOUS) ×2 IMPLANT
SUCTION FRAZIER TIP 10 FR DISP (SUCTIONS) ×2 IMPLANT
SUT DVC 2 QUILL PDO  T11 36X36 (SUTURE) ×1
SUT DVC 2 QUILL PDO T11 36X36 (SUTURE) ×1 IMPLANT
SUT DVC QUILL MONODERM 30X30 (SUTURE) ×2 IMPLANT
SUT ETHIBOND NAB CT1 #1 30IN (SUTURE) ×2 IMPLANT
SYR 20CC LL (SYRINGE) ×4 IMPLANT
SYR 50ML LL SCALE MARK (SYRINGE) ×2 IMPLANT
TOWER CARTRIDGE SMART MIX (DISPOSABLE) ×2 IMPLANT
WATER STERILE IRR 1000ML POUR (IV SOLUTION) IMPLANT
WRAPON POLAR PAD KNEE (MISCELLANEOUS) ×2
femur cutting block ×2 IMPLANT
tibial cutting block ×2 IMPLANT

## 2014-08-30 NOTE — Progress Notes (Signed)
Right knee xray completed.

## 2014-08-30 NOTE — Progress Notes (Signed)
Inpatient Diabetes Program Recommendations  AACE/ADA: New Consensus Statement on Inpatient Glycemic Control (2013)  Target Ranges:  Prepandial:   less than 140 mg/dL      Peak postprandial:   less than 180 mg/dL (1-2 hours)      Critically ill patients:  140 - 180 mg/dL   Results for DEMARION, PONDEXTER (MRN [id number]) as of 08/30/2014 12:45  Ref. Range 08/30/2014 06:17  Glucose-Capillary Latest Ref Range: 65-99 mg/dL 182 (H)   Diabetes history: DM2 Outpatient Diabetes medications: Onglyza 5 mg QAM, Glipizide 10 mg BID Current orders for Inpatient glycemic control: Glipizide 10 mg BID, Tradjenta 5 mg daily  Inpatient Diabetes Program Recommendations Correction (SSI): Initial glucose 182 mg/dl on 7/26. While inpatient, please consider ordering CBGs with Novolog correction ACHS. HgbA1C: Please consider ordering an A1C to evaluate glycemic control over the past 2-3 months. Diet: Currently ordered a regular diet. Please change diet to Carb Modified Diabetic diet.  Thanks, Barnie Alderman, RN, MSN, CCRN, CDE Diabetes Coordinator Inpatient Diabetes Program [phone] (Team Pager from [place] to [place]) [phone] ([place] office) [phone] [facility] office) [phone] [facility] office)

## 2014-08-30 NOTE — Anesthesia Postprocedure Evaluation (Signed)
  Anesthesia Post-op Note  Patient: Kevin Perry  Procedure(s) Performed: Procedure(s): TOTAL KNEE ARTHROPLASTY (Right)  Anesthesia type:Spinal  Patient location: PACU  Post pain: Pain level controlled  Post assessment: Post-op Vital signs reviewed, Patient's Cardiovascular Status Stable, Respiratory Function Stable, Patent Airway and No signs of Nausea or vomiting  Post vital signs: Reviewed and stable  Last Vitals:  Filed Vitals:   08/30/14 1430  BP: 124/78  Pulse: 43  Temp: 36.5 C  Resp: 18    Level of consciousness: awake, alert  and patient cooperative  Complications: No apparent anesthesia complications

## 2014-08-30 NOTE — Anesthesia Procedure Notes (Signed)
Spinal  Start time: 08/30/2014 7:20 AM End time: 08/30/2014 7:28 AM Staffing Resident/CRNA: Haisley Arens Performed by: resident/CRNA  Preanesthetic Checklist Completed: patient identified, site marked, surgical consent, pre-op evaluation, timeout performed, IV checked, risks and benefits discussed and monitors and equipment checked Spinal Block Patient position: sitting Prep: Betadine Patient monitoring: heart rate Approach: midline Location: L4-5 Injection technique: single-shot Needle Needle type: Whitacre  Needle length: 10 cm Assessment Sensory level: T10

## 2014-08-30 NOTE — Plan of Care (Signed)
Problem: Consults Goal: Diagnosis- Total Joint Replacement Outcome: Completed/Met Date Met:  08/30/14 Primary Total Knee

## 2014-08-30 NOTE — Anesthesia Preprocedure Evaluation (Signed)
Anesthesia Evaluation  Patient identified by MRN, date of birth, ID band Patient awake    Reviewed: Allergy & Precautions, NPO status , Patient's Chart, lab work & pertinent test results, reviewed documented beta blocker date and time   Airway Mallampati: III  TM Distance: >3 FB     Dental  (+) Chipped   Pulmonary former smoker,          Cardiovascular hypertension, + Past MI     Neuro/Psych Anxiety    GI/Hepatic   Endo/Other  diabetes, Type 2  Renal/GU      Musculoskeletal  (+) Arthritis -,   Abdominal   Peds  Hematology   Anesthesia Other Findings   Reproductive/Obstetrics                             Anesthesia Physical Anesthesia Plan  ASA: III  Anesthesia Plan: Spinal   Post-op Pain Management:    Induction:   Airway Management Planned: Nasal Cannula  Additional Equipment:   Intra-op Plan:   Post-operative Plan:   Informed Consent: I have reviewed the patients History and Physical, chart, labs and discussed the procedure including the risks, benefits and alternatives for the proposed anesthesia with the patient or authorized representative who has indicated his/her understanding and acceptance.     Plan Discussed with: CRNA  Anesthesia Plan Comments:         Anesthesia Quick Evaluation

## 2014-08-30 NOTE — Progress Notes (Signed)
PT WITH PAIN UNCONTROLLED  AFTER USE OF PRESENT REGIME. PT AGITATED. DR [facility] NOTIFIED. MD ORDERS DILAUDID  .5MG  IV ONCE THEN ANOTHER DOSE IN 15 MINUTES. OXYCODONE AS ORDERED

## 2014-08-30 NOTE — H&P (Signed)
Reviewed paper H+P, will be scanned into chart. No changes noted.  

## 2014-08-30 NOTE — Progress Notes (Signed)
Polar care applied to right knee.

## 2014-08-30 NOTE — Transfer of Care (Signed)
Immediate Anesthesia Transfer of Care Note  Patient: Kevin Perry [facility]  Procedure(s) Performed: Procedure(s): TOTAL KNEE ARTHROPLASTY (Right)  Patient Location: PACU  Anesthesia Type:Spinal  Level of Consciousness: awake, alert  and oriented  Airway & Oxygen Therapy: Patient Spontanous Breathing  Post-op Assessment: Report given to RN and Post -op Vital signs reviewed and stable  Post vital signs: Reviewed and stable  Last Vitals:  Filed Vitals:   08/30/14 0952  BP: 114/57  Pulse: 61  Temp: 36.6 C  Resp: 16    Complications: No apparent anesthesia complications

## 2014-08-30 NOTE — Progress Notes (Signed)
Pt. Couldn't tolerate bone foam. Removed.

## 2014-08-30 NOTE — Op Note (Signed)
08/30/2014  9:53 AM  PATIENT:  Kevin Perry  64 y.o. male  PRE-OPERATIVE DIAGNOSIS:  OA  POST-OPERATIVE DIAGNOSIS:  OA  PROCEDURE:  Procedure(s): TOTAL KNEE ARTHROPLASTY (Right)  SURGEON: Laurene Footman, MD  ASSISTANTS: Arvella Nigh [facility]  ANESTHESIA:   spinal  EBL:  Total I/O In: 1800 [I.V.:1800] Out: 300 [Urine:150; Blood:150]  BLOOD ADMINISTERED:none  DRAINS: none   LOCAL MEDICATIONS USED:  MARCAINE    and OTHER Exparel morphine  SPECIMEN:  Source of Specimen:  Cut ends of bone  DISPOSITION OF SPECIMEN:  PATHOLOGY  COUNTS:  YES  TOURNIQUET:   88 minutes at 300 mmHg  IMPLANTS: Medacta GMK sphere 6+ femur 5 tibia 10 mm insert 3 patella all components cemented   DICTATION: .Dragon Dictation patient brought the operating room and after adequate anesthesia was obtained the right leg was prepped and draped in sterile fashion was turned by the upper thigh. After patient identification and timeout procedures were completed, tourniquet was raised to 300 mmHg and a midline skin incision was made. Inspection knee revealed severe medial compartment arthrosis with exposed bone  and eburnated bone on the femoral and tibial condyles moderate lateral compartment degenerative change and severe patellofemoral arthritis with extensive osteophytes . Anterior cruciate ligament and PCL were incised and the infrapatellar fat pad excised. Medacta my knee tibia cutting guide was applied and the proximal tibia cut carried out using the Hooven my knee system. Going to the femur identical procedure carried out. The posterior horns of menisci removed and the 4-in-1 cutting guide applied for the 6+ femur anterior posterior and chamfer cuts made with no notching. The tibial trial was placed in appropriate rotation and proximal reaming carried out for short stem and keel punch placed followed by placement of the 6+ femur with 10 mm insert gave excellent stability and full extension. Distal femoral drill  holes were made and a notch cut made using a router for the trochlear groove. All the trials were then removed and the patella prepared using the cutting guide and then drill sized to a size 3. . At this point the knee was infiltrated with half percent Sensorcaine with epinephrine and morphine IN a dilute solution as well as Exparel IN a dilute solution. The bony surfaces were then thoroughly irrigated and dried and all components cemented in place. Lateral release was required for patellar stability After the cement set the knee was irrigated with Betadine dilute Betadine solution excess cement removed and the patella tracked well with no touch technique good stability through range of motion. The arthrotomy was then repaired using a heavy Quill with to a Quill subcutaneously and skin staples applied. Xeroform 4 x 4's ABDs and web roll Polar Care and Ace wrap applied and patient sent to recovery room   PLAN OF CARE: Admit to inpatient   PATIENT DISPOSITION: PACU - hemodynamically stable.

## 2014-08-31 ENCOUNTER — Encounter: Payer: Self-pay | Admitting: Orthopedic Surgery

## 2014-08-31 LAB — CBC
HCT: 41.9 % (ref 40.0–52.0)
Hemoglobin: 14.3 g/dL (ref 13.0–18.0)
MCH: 30.9 pg (ref 26.0–34.0)
MCHC: 34 g/dL (ref 32.0–36.0)
MCV: 90.9 fL (ref 80.0–100.0)
Platelets: 161 10*3/uL (ref 150–440)
RBC: 4.61 MIL/uL (ref 4.40–5.90)
RDW: 13.6 % (ref 11.5–14.5)
WBC: 10.7 10*3/uL — ABNORMAL HIGH (ref 3.8–10.6)

## 2014-08-31 LAB — BASIC METABOLIC PANEL WITH GFR
Anion gap: 11 (ref 5–15)
BUN: 9 mg/dL (ref 6–20)
CO2: 25 mmol/L (ref 22–32)
Calcium: 8.4 mg/dL — ABNORMAL LOW (ref 8.9–10.3)
Chloride: 97 mmol/L — ABNORMAL LOW (ref 101–111)
Creatinine, Ser: 0.77 mg/dL (ref 0.61–1.24)
GFR calc Af Amer: >60 mL/min
GFR calc non Af Amer: >60 mL/min
Glucose, Bld: 257 mg/dL — ABNORMAL HIGH (ref 65–99)
Potassium: 4 mmol/L (ref 3.5–5.1)
Sodium: 133 mmol/L — ABNORMAL LOW (ref 135–145)

## 2014-08-31 MED ORDER — MORPHINE SULFATE 1 MG/ML IV SOLN
INTRAVENOUS | Status: DC
  Administered 2014-08-31: 10:00:00 via INTRAVENOUS
  Filled 2014-08-31: qty 25

## 2014-08-31 MED ORDER — DIPHENHYDRAMINE HCL 12.5 MG/5ML PO ELIX: 12.5000 mg | ORAL_SOLUTION | Freq: Four times a day (QID) | ORAL | Status: DC | PRN

## 2014-08-31 MED ORDER — MORPHINE SULFATE 4 MG/ML IJ SOLN
INTRAMUSCULAR | Status: AC
  Administered 2014-08-31: 4 mg
  Filled 2014-08-31: qty 1

## 2014-08-31 MED ORDER — MORPHINE SULFATE 2 MG/ML IJ SOLN: 2.0000 mg | INTRAMUSCULAR | Status: DC | PRN

## 2014-08-31 MED ORDER — ONDANSETRON HCL 4 MG/2ML IJ SOLN
4.0000 mg | Freq: Four times a day (QID) | INTRAMUSCULAR | Status: DC | PRN
  Administered 2014-09-01: 4 mg via INTRAVENOUS
  Filled 2014-08-31: qty 2

## 2014-08-31 MED ORDER — MORPHINE SULFATE 4 MG/ML IJ SOLN: 4.0000 mg | INTRAMUSCULAR | Status: DC | PRN

## 2014-08-31 MED ORDER — DIPHENHYDRAMINE HCL 50 MG/ML IJ SOLN: 12.5000 mg | Freq: Four times a day (QID) | INTRAMUSCULAR | Status: DC | PRN

## 2014-08-31 MED ORDER — SODIUM CHLORIDE 0.9 % IJ SOLN: 9.0000 mL | INTRAMUSCULAR | Status: DC | PRN

## 2014-08-31 MED ORDER — NALOXONE HCL 0.4 MG/ML IJ SOLN: 0.4000 mg | INTRAMUSCULAR | Status: DC | PRN

## 2014-08-31 NOTE — Progress Notes (Signed)
Resting in bed, pain managed by PCA, wife at bedside, no signs or symptoms of any resp distress.

## 2014-08-31 NOTE — Progress Notes (Signed)
Clinical Education officer, museum (CSW) received SNF consult. PT is recommending home health. CSW sent RN Case Manager a message making her aware of above. Please reconsult if future social work needs arise. CSW signing off.   Kevin Perry, [place] [phone]

## 2014-08-31 NOTE — Progress Notes (Addendum)
   08/31/14 2200  Clinical Encounter Type  Visited With Family  Visit Type Spiritual support  Spiritual Encounters  Spiritual Needs Prayer  Stress Factors  Patient Stress Factors None identified   Status: drowsy/Primary osteoarthritis of knee Faith: Nondenom Family: wife present Age/Sex: male 52 Visit Assessment: The wife shared that she was taking a break and making a [employer] run as the patient was drowsy. Introduced pastoral care and gave encouraging words  Pastoral care can be reached via pager [phone] and by submitting an online request.

## 2014-08-31 NOTE — Progress Notes (Signed)
Pt complaining of pain not being controlled with medication ordered. Dr Roland Rack notified. Order received for morphine 4 mgm every hour prn

## 2014-08-31 NOTE — Progress Notes (Signed)
   Subjective: 1 Day Post-Op Procedure(s) (LRB): TOTAL KNEE ARTHROPLASTY (Right) Patient reports pain as 4 on 0-10 scale.   Patient is well this am. Last night severe pain. We will start therapy today.  Plan is to go Home after hospital stay.  Objective: Vital signs in last 24 hours: Temp:  [97.6 F (36.4 C)-99.3 F (37.4 C)] 98.5 F (36.9 C) (07/27 0429) Pulse Rate:  [43-72] 68 (07/27 0429) Resp:  [13-20] 18 (07/27 0429) BP: (113-178)/(57-94) 178/87 mmHg (07/27 0429) SpO2:  [90 %-98 %] 91 % (07/27 0429) Weight:  [133.902 kg (295 lb 3.2 oz)] 133.902 kg (295 lb 3.2 oz) (07/26 1809)  Intake/Output from previous day: 07/26 0701 - 07/27 0700 In: 4953.3 [P.O.:980; I.V.:3623.3; IV Piggyback:100] Out: 2250 [Urine:2100; Blood:150] Intake/Output this shift:     Recent Labs  08/30/14 1145 08/31/14 0605  HGB 13.6 14.3    Recent Labs  08/30/14 1145 08/31/14 0605  WBC 7.4 10.7*  RBC 4.35* 4.61  HCT 39.8* 41.9  PLT 150 161    Recent Labs  08/30/14 1145 08/31/14 0605  NA  --  133*  K  --  4.0  CL  --  97*  CO2  --  25  BUN  --  9  CREATININE 0.85 0.77  GLUCOSE  --  257*  CALCIUM  --  8.4*    Recent Labs  08/30/14 0638  INR 0.95    EXAM General - Patient is Alert, Appropriate and Oriented Extremity - Neurovascular intact Sensation intact distally Intact pulses distally Dorsiflexion/Plantar flexion intact Dressing - dressing C/D/I and no drainage Motor Function - intact, moving foot and toes well on exam.   Past Medical History  Diagnosis Date  . Arthritis   . Diabetes mellitus without complication   . Hypertension   . Anxiety   . Myocardial infarction 2008    Assessment/Plan:   1 Day Post-Op Procedure(s) (LRB): TOTAL KNEE ARTHROPLASTY (Right) Active Problems:   Primary osteoarthritis of knee  Estimated body mass index is 36.9 kg/(m^2) as calculated from the following:   Height as of this encounter: 6\' 3"  (1.905 m).   Weight as of this  encounter: 133.902 kg (295 lb 3.2 oz). Advance diet Up with therapy  Needs BM Recheck labs in the am  DVT Prophylaxis - Lovenox, Foot Pumps and TED hose Weight-Bearing as tolerated to right leg D/C O2 and Pulse OX and try on Room Air  T. Rachelle Hora, PA-C [place] 08/31/2014, 8:00 AM

## 2014-08-31 NOTE — Progress Notes (Signed)
Pt dangled on side of bed. States made him feel better

## 2014-08-31 NOTE — Evaluation (Signed)
Physical Therapy Evaluation Patient Details Name: Kevin Perry MRN: [id number] DOB: March 29, 1950 Today's Date: 08/31/2014   History of Present Illness  This patient is a 64 year old male who came to [facility] for a R TKR 08/30/14.  Clinical Impression  Currently pt demonstrates impairments with R LE strength, R knee ROM, balance, pain, and limitations with functional mobility.  Prior to admission, pt was independent and working.  Pt lives with his wife in 1 level home with 6 STE with B railings.  Currently pt is min assist with transfers and ambulating short distance with RW bed to chair; activities limited d/t R knee pain.  Pt would benefit from skilled PT to address above noted impairments and functional limitations.  Anticipate with improved pain control, pt will be able to progress to discharge home with HHPT and support of family.     Follow Up Recommendations Home health PT    Equipment Recommendations   (need to assess pt's borrowed RW to see if it is appropriate fit/safe for pt)    Recommendations for Other Services       Precautions / Restrictions Precautions Precautions: Fall;Knee Precaution Booklet Issued: Yes (comment) Restrictions Weight Bearing Restrictions: Yes RLE Weight Bearing: Weight bearing as tolerated Other Position/Activity Restrictions: WBAT      Mobility  Bed Mobility Overal bed mobility: Needs Assistance Bed Mobility: Supine to Sit     Supine to sit: Supervision;HOB elevated     General bed mobility comments: vc's for safety and technique required  Transfers Overall transfer level: Needs assistance Equipment used: Rolling walker (2 wheeled) Transfers: Sit to/from Stand Sit to Stand: Min assist         General transfer comment: vc's required for hand and feet position; pt expressing pain in R knee upon standing  Ambulation/Gait Ambulation/Gait assistance: Min guard;Min assist Ambulation Distance (Feet): 3 Feet (bed to chair) Assistive  device: Rolling walker (2 wheeled)   Gait velocity: decreased   General Gait Details: antalgic; decreased stance time R LE; decreased L step length; step to gait pattern  Stairs            Wheelchair Mobility    Modified Rankin (Stroke Patients Only)       Balance Overall balance assessment: Needs assistance Sitting-balance support: Bilateral upper extremity supported;Feet supported Sitting balance-Leahy Scale: Good     Standing balance support: Bilateral upper extremity supported Standing balance-Leahy Scale: Good                               Pertinent Vitals/Pain Pain Assessment: 0-10 Pain Score: 7  (4/10 beginning of session; 7/10 during session; 3/10 end of session) Pain Location: R knee Pain Descriptors / Indicators: Sharp;Shooting Pain Intervention(s): Limited activity within patient's tolerance;Monitored during session;Premedicated before session;Repositioned;RN gave pain meds during session;Ice applied  During session pt's HR 66-73 bpm and O2 >95% on room air.    Home Living Family/patient expects to be discharged to:: Private residence Living Arrangements: Spouse/significant other Available Help at Discharge: Family Type of Home: House Home Access: Stairs to enter Entrance Stairs-Rails: Can reach both Entrance Stairs-Number of Steps: [place]: One level Home Equipment: Tub bench (borrowed RW--PT to see if it is appropriate fit)      Prior Function Level of Independence: Independent         Comments: Works     Journalist, newspaper        Extremity/Trunk Assessment  Upper Extremity Assessment: Overall WFL for tasks assessed           Lower Extremity Assessment: RLE deficits/detail;LLE deficits/detail RLE Deficits / Details: Pt initially requiring assist for R LE SLR but then able to perform on his own with vc's for technique and set-up; R DF at least 4/5 LLE Deficits / Details: WFL  Cervical / Trunk Assessment: Normal   Communication   Communication: No difficulties  Cognition Arousal/Alertness: Suspect due to medications (Pt intermittently falling asleep) Behavior During Therapy: WFL for tasks assessed/performed Overall Cognitive Status: Within Functional Limits for tasks assessed                      General Comments   Nursing cleared pt for participation in physical therapy.  Pt agreeable to PT session.  Pt's wife present throughout PT session.    Exercises Total Joint Exercises Goniometric ROM: R knee extension 18 degrees short of neutral semi-supine; R knee flexion 67 degrees in sitting  Performed semi-supine B LE therapeutic exercise x 10 reps:  Ankle pumps (AROM B LE's); quad sets x3 second holds (AROM B LE's); glute squeezes x3 second holds (AROM B); SAQ's (AAROM R; AROM L); heelslides (AAROM R; AROM L), hip abd/adduction (AAROM R; AROM L), and SLR (AAROM x4 reps to AROM x6 reps R; AROM L).  Pt required vc's and tactile cues for correct technique with exercises.  Pt and pt's wife educated on appropriate R LE positioning to prevent R knee extension ROM impairments (and pt set up in appropriate position end of session).       Assessment/Plan    PT Assessment Patient needs continued PT services  PT Diagnosis Difficulty walking;Acute pain   PT Problem List Decreased strength;Decreased range of motion;Decreased activity tolerance;Decreased balance;Decreased mobility;Decreased knowledge of use of DME;Pain  PT Treatment Interventions DME instruction;Gait training;Stair training;Functional mobility training;Therapeutic activities;Therapeutic exercise;Balance training;Patient/family education;Manual techniques   PT Goals (Current goals can be found in the Care Plan section) Acute Rehab PT Goals Patient Stated Goal: To go home PT Goal Formulation: With patient Time For Goal Achievement: 09/14/14 Potential to Achieve Goals: Good    Frequency BID   Barriers to discharge         Co-evaluation               End of Session Equipment Utilized During Treatment: Gait belt Activity Tolerance: Patient limited by pain Patient left: in chair;with call bell/phone within reach;with chair alarm set;with family/visitor present (R heel elevated via towel roll and pillow placed lateral to R LE to prevent LE ER (to place R knee in comfortable extension stretch); OT present end of session and reported he would apply SCD's end of his session) Nurse Communication: Mobility status         Time: 1696-7893 PT Time Calculation (min) (ACUTE ONLY): 51 min   Charges:   PT Evaluation $Initial PT Evaluation Tier I: 1 Procedure PT Treatments $Therapeutic Exercise: 23-37 mins   PT G CodesLeitha Bleak 09-20-2014, 11:12 AM Leitha Bleak, [place]

## 2014-08-31 NOTE — Progress Notes (Signed)
pca set up per Gailyn Crook and amanda campbell   Pt pain level is improved , working with physical therapy , pt up to chair tol well

## 2014-08-31 NOTE — Progress Notes (Signed)
Physical Therapy Treatment Patient Details Name: Kevin Perry MRN: [id number] DOB: 27-Jul-1950 Today's Date: 08/31/2014    History of Present Illness This patient is a 64 year old male who came to [facility] for a R TKR 08/30/14.    PT Comments    Pt able to progress ambulation distance this afternoon to 40 feet with RW.  Pain appears to be improving with current pain medication management.  Pt continues to require education and set-up of proper R LE positioning to improve R knee extension ROM impairments.  Anticipate with continued pain control and progress, pt will be able to discharge home with HHPT and support of family.  Follow Up Recommendations  Home health PT     Equipment Recommendations   (need to assess pt's borrowed RW for appropriate fit)    Recommendations for Other Services       Precautions / Restrictions Precautions Precautions: Fall;Knee Restrictions Weight Bearing Restrictions: Yes RLE Weight Bearing: Weight bearing as tolerated    Mobility  Bed Mobility Overal bed mobility: Needs Assistance Bed Mobility: Sit to Supine       Sit to supine: Min assist   General bed mobility comments: assist for R LE  Transfers Overall transfer level: Needs assistance Equipment used: Rolling walker (2 wheeled) Transfers: Sit to/from Stand Sit to Stand: Min guard;Min assist         General transfer comment: vc's required for hand and feet position  Ambulation/Gait Ambulation/Gait assistance: Min guard;+2 safety/equipment Ambulation Distance (Feet): 40 Feet Assistive device: Rolling walker (2 wheeled)   Gait velocity: decreased   General Gait Details: antalgic; decreased stance time R LE; decreased L step length; step to gait pattern   Stairs            Wheelchair Mobility    Modified Rankin (Stroke Patients Only)       Balance                                    Cognition Arousal/Alertness: Awake/alert Behavior During Therapy:  WFL for tasks assessed/performed Overall Cognitive Status: Within Functional Limits for tasks assessed                      Exercises  Pt required continued education regarding R LE positioning (pt's R knee flexed and externally rotated elevated in chair upon arrival in PM).    General Comments   Nursing cleared pt for participation in physical therapy.  Pt agreeable to PT session.  Pt's wife left for PT session.       Pertinent Vitals/Pain Pain Assessment: 0-10 Pain Score: 4  Pain Location: R knee Pain Descriptors / Indicators: Sharp;Shooting Pain Intervention(s): Limited activity within patient's tolerance;Monitored during session;PCA encouraged;Repositioned;Ice applied  Pt's O2 initially decreased to 89% (from 96%) on 1 L/min via nasal cannula with activity but with vc's for breathing technique improved to 95% on 1 L/min via nasal cannula with activity.  Pt's HR 69-73 bpm during session.    Home Living                      Prior Function            PT Goals (current goals can now be found in the care plan section) Acute Rehab PT Goals Patient Stated Goal: To go home PT Goal Formulation: With patient Time For Goal Achievement: 09/14/14 Potential to Achieve  Goals: Good Progress towards PT goals: Progressing toward goals    Frequency  BID    PT Plan Current plan remains appropriate    Co-evaluation             End of Session Equipment Utilized During Treatment: Gait belt;Oxygen (2 L/min via nasal cannula with activity; 1 L/min via nasal cannula at rest) Activity Tolerance: Patient tolerated treatment well Patient left: in bed;with call bell/phone within reach;with bed alarm set;with SCD's reapplied (L heel elevated via towel roll; R heel elevated via foam wedge for optimal R knee extension and pillow placed under R hip to prevent R LE ER; polar care applied)     Time: 2458-0998 PT Time Calculation (min) (ACUTE ONLY): 45 min  Charges:  $Gait  Training: 8-22 mins $Therapeutic Exercise: 8-22 mins $Therapeutic Activity: 8-22 mins                    G CodesLeitha Bleak 2014-09-24, 5:58 PM Leitha Bleak, [place]

## 2014-08-31 NOTE — Care Management Note (Signed)
Case Management Note  Patient Details  Name: Kevin Perry MRN: [id number] Date of Birth: March 11, 1950  Subjective/Objective:                  Patient from home with wife.  Patient plan to return home with home health PT.  Patient has borrowed walker at bedside.  PT expects that walker will be appropriate for patient.  Patient uses [employer] [facility] on [address].    Action/Plan: Patient provided list of home health agencies.  Patient would like to wait to select agency until discussed with wife.  Levada Dy RN from care management to call in rx for Lovenox 40mg  #[phone]  Expected Discharge Date:                  Expected Discharge Plan:     In-House Referral:     Discharge planning Services  CM Consult  Post Acute Care Choice:  Durable Medical Equipment, Home Health Choice offered to:  Patient  DME Arranged:    DME Agency:     HH Arranged:  PT HH Agency:     Status of Service:  In process, will continue to follow  Medicare Important Message Given:    Date Medicare IM Given:    Medicare IM give by:    Date Additional Medicare IM Given:    Additional Medicare Important Message give by:     If discussed at [place] of Stay Meetings, dates discussed:    Additional Comments:  Beverly Sessions, RN 08/31/2014, 2:20 PM

## 2014-08-31 NOTE — Progress Notes (Signed)
Patient had severe pain through night shift but has managed well this shift after placement of PCA pump.  Patient placed on oxygen due to PCA pump and sats during deep sleep and exertion dropped to 89%.  Encouraged patient to keep leg straight and not bent since he was favoring the bent position.  Wife present at bedside.  Walked to door with physical therapy.

## 2014-08-31 NOTE — Evaluation (Signed)
Occupational Therapy Evaluation Patient Details Name: Kevin Perry MRN: [id number] DOB: 11-04-1950 Today's Date: 08/31/2014    History of Present Illness This patient is a 64 year old male who came to [facility] for a R TKR.   Clinical Impression   This patient is a 64 year old male who came to [facility] for a R total knee replacement.  Patient lives in a one story home with 6 steps to enter and bilateral rails.  He had been independent with ADL and functional mobility and works. He now requires  assistance for lower body dressing. He would benefit from Occupational Therapy for ADL/functioal mobility training       Follow Up Recommendations       Equipment Recommendations       Recommendations for Other Services       Precautions / Restrictions Precautions Precautions: Fall;Knee Restrictions Other Position/Activity Restrictions: WBAT      Mobility Bed Mobility                  Transfers                      Balance                                            ADL                                         General ADL Comments: Had been independent, now needs assist. Patient practiced techniques for lower body dressing practicing Donned/doffed socks and pants to knees (drain still in place) with moderate assist.      Vision     Perception     Praxis      Pertinent Vitals/Pain Pain Assessment: 0-10 Pain Score: 3      Hand Dominance     Extremity/Trunk Assessment Upper Extremity Assessment Upper Extremity Assessment: Overall WFL for tasks assessed   Lower Extremity Assessment Lower Extremity Assessment: Defer to PT evaluation       Communication Communication Communication:  (Patient a bit sleepy but able to participate)   Cognition Arousal/Alertness:  (sleepy) Behavior During Therapy: WFL for tasks assessed/performed Overall Cognitive Status: Within Functional Limits for tasks  assessed                     General Comments       Exercises       Shoulder Instructions      Home Living Family/patient expects to be discharged to:: Private residence Living Arrangements: Spouse/significant other Available Help at Discharge: Family Type of Home: House Home Access: Stairs to enter Technical brewer of Steps: 6 Entrance Stairs-Rails: Can reach both Home Layout: One level     Bathroom Shower/Tub: Walk-in shower         Home Equipment: Tub bench          Prior Functioning/Environment Level of Independence: Independent        Comments: works    OT Diagnosis: Acute pain   OT Problem List:     OT Treatment/Interventions: Self-care/ADL training    OT Goals(Current goals can be found in the care plan section) Acute Rehab OT Goals Patient Stated Goal: To go home OT Goal Formulation:  With patient/family Time For Goal Achievement: 09/14/14 Potential to Achieve Goals: Good  OT Frequency: Min 1X/week   Barriers to D/C:            Co-evaluation              End of Session Equipment Utilized During Treatment:  (hip kit)  Activity Tolerance: Patient tolerated treatment well Patient left: in chair;with call bell/phone within reach;with chair alarm set;with nursing/sitter in room;with family/visitor present   Time: 8466-5993 OT Time Calculation (min): 20 min Charges:  OT General Charges $OT Visit: 1 Procedure OT Evaluation $Initial OT Evaluation Tier I: 1 Procedure OT Treatments $Self Care/Home Management : 8-22 mins G-Codes:    Myrene Galas, MS/OTR/L  08/31/2014, 10:27 AM

## 2014-09-01 LAB — CBC
HCT: 40.2 % (ref 40.0–52.0)
Hemoglobin: 14 g/dL (ref 13.0–18.0)
MCH: 31.2 pg (ref 26.0–34.0)
MCHC: 34.7 g/dL (ref 32.0–36.0)
MCV: 89.8 fL (ref 80.0–100.0)
Platelets: 151 10*3/uL (ref 150–440)
RBC: 4.48 MIL/uL (ref 4.40–5.90)
RDW: 13.3 % (ref 11.5–14.5)
WBC: 9.8 10*3/uL (ref 3.8–10.6)

## 2014-09-01 MED ORDER — ENOXAPARIN SODIUM 40 MG/0.4ML ~~LOC~~ SOLN: 40.0000 mg | SUBCUTANEOUS | Status: DC

## 2014-09-01 MED ORDER — OXYCODONE HCL 5 MG PO TABS: 5.0000 mg | ORAL_TABLET | ORAL | Status: DC | PRN

## 2014-09-01 NOTE — Progress Notes (Signed)
Patient cooperative with care and only complaints are of moderate pain that is being managed very well with po pain medication.  No breakthrough pain required.  Patient completed stairs and walked from PT gym back to his room.  Wife present in room with this shift.  Plan to discharge to home with HHPT.

## 2014-09-01 NOTE — Progress Notes (Signed)
Physical Therapy Treatment Patient Details Name: Kevin Perry MRN: [id number] DOB: May 18, 1950 Today's Date: 09/01/2014    History of Present Illness This patient is a 64 year old male who came to [facility] for a R TKR 08/30/14.    PT Comments    Pt progressing with ambulation distance using RW.  Pt's R knee initially "buckling" navigating stairs requiring increased assist (2 assist) but pt CGA x2 on 2nd trial navigating 4 stairs with B railing.  Plan to assess stairs tomorrow morning to make sure pt is safe to discharge home (may benefit from [facility] on stairs to prevent knee buckling but will continue to assess).  Pt's wife reports she will have a 2nd assist on stairs at home.  Will continue to work towards safe discharge home.  Follow Up Recommendations  Home health PT;Supervision for mobility/OOB; assist for stairs     Equipment Recommendations  Rolling walker with 5" wheels    Recommendations for Other Services       Precautions / Restrictions Precautions Precautions: Fall Restrictions Weight Bearing Restrictions: Yes RLE Weight Bearing: Weight bearing as tolerated    Mobility  Bed Mobility               General bed mobility comments: Deferred d/t pt sitting up in recliner upon arrival and requesting to sit in recliner end of session.  Transfers Overall transfer level: Needs assistance Equipment used: Rolling walker (2 wheeled) Transfers: Sit to/from Stand Sit to Stand: Min guard (x3 trials)         General transfer comment: very mildly unsteady standing from recliner but able to self correct balance with RW use  Ambulation/Gait Ambulation/Gait assistance: Supervision;Min guard Ambulation Distance (Feet): 180 Feet Assistive device: Rolling walker (2 wheeled)   Gait velocity: decreased   General Gait Details: antalgic; decreased stance time R LE; decreased L step length; step to gait pattern; vc's required for R quad set during R stance phase (mild R knee flexion  motion noted otherwise during R stance phase) (pt taking multiple short standing rest breaks d/t fatigue)   Stairs Stairs: Yes Stairs assistance: +2 physical assistance (mod assist x2 1st step d/t R knee buckling then min assist x2 rest of 1st trial of stairs; CGA x2 2nd trial of stairs) Stair Management: Two rails;Step to pattern;Forwards Number of Stairs:  (4 stairs x2) General stair comments: pt's R knee buckling first step requiring 2 assist to steady; pt with mild R knee buckling navigating up next 3 stairs and down 4 stairs; pt with only mild R knee flexion movement with 2nd trial navigating up/down 4 stairs.  Wheelchair Mobility    Modified Rankin (Stroke Patients Only)       Balance Overall balance assessment: Needs assistance Sitting-balance support: Feet supported;No upper extremity supported Sitting balance-Leahy Scale: Normal     Standing balance support: Bilateral upper extremity supported Standing balance-Leahy Scale: Good                      Cognition Arousal/Alertness: Awake/alert Behavior During Therapy: WFL for tasks assessed/performed Overall Cognitive Status: Within Functional Limits for tasks assessed                      Exercises      General Comments General comments (skin integrity, edema, etc.): some drainage noted on R knee dressing prior to starting PT session (no increase noted during PT session).   RW delivered to pt's room and PT adjusted  walker for appropriate fit to pt.      Pertinent Vitals/Pain Pain Assessment: 0-10 Pain Score: 5  (0/10 at rest beginning of session; 5/10 with ambulation; 3/10 end of session) Pain Location: R knee Pain Descriptors / Indicators: Sore;Tender Pain Intervention(s): Limited activity within patient's tolerance;Monitored during session;Repositioned;RN gave pain meds during session;Ice applied  Vitals stable and WFL throughout treatment session.    Home Living                       Prior Function            PT Goals (current goals can now be found in the care plan section) Acute Rehab PT Goals Patient Stated Goal: To go home PT Goal Formulation: With patient Time For Goal Achievement: 09/14/14 Potential to Achieve Goals: Good Progress towards PT goals: Progressing toward goals    Frequency  BID    PT Plan Current plan remains appropriate    Co-evaluation             End of Session Equipment Utilized During Treatment: Gait belt Activity Tolerance: Patient tolerated treatment well Patient left: in chair;with call bell/phone within reach;with chair alarm set;with family/visitor present;with SCD's reapplied (R heel elevated via towel roll; pillow placed lateral to R LE to prevent R LE ER; polar care applied)     Time: 5364-6803 PT Time Calculation (min) (ACUTE ONLY): 57 min  Charges:  $Gait Training: 38-52 mins $Therapeutic Activity: 8-22 mins                    G CodesLeitha Perry 2014/09/05, 3:21 PM Kevin Perry, [place]

## 2014-09-01 NOTE — Progress Notes (Signed)
Physical Therapy Treatment Patient Details Name: Kevin Perry MRN: [id number] DOB: 04/23/1950 Today's Date: 09/01/2014    History of Present Illness This patient is a 64 year old male who came to [facility] for a R TKR 08/30/14.    PT Comments    Pt progressing with ambulation distance using RW but requires vc's for R quad set during R stance phase (for optimal R knee extension activation); distance limited d/t fatigue.  R LE positioning and R knee extension appear to be improving but pt still requires some education and set-up for R LE positioning to improve R knee extension.  Pain control appears to be improving.  Anticipate pt will be able to discharge home with HHPT and support of family.  Need to assess stairs this afternoon.  Follow Up Recommendations  Home health PT     Equipment Recommendations  Rolling walker with 5" wheels    Recommendations for Other Services       Precautions / Restrictions Precautions Precautions: Fall Restrictions Weight Bearing Restrictions: Yes RLE Weight Bearing: Weight bearing as tolerated    Mobility  Bed Mobility               General bed mobility comments: Deferred d/t pt sitting up in recliner upon arrival and requesting to sit in recliner end of session.  Transfers Overall transfer level: Needs assistance Equipment used: Rolling walker (2 wheeled) Transfers: Sit to/from Stand (x3 trials) Sit to Stand: Min guard         General transfer comment: mildly unsteady standing from recliner but able to self correct balance with RW use  Ambulation/Gait Ambulation/Gait assistance: Min guard Ambulation Distance (Feet):  (60 feet x2) Assistive device: Rolling walker (2 wheeled)   Gait velocity: decreased   General Gait Details: antalgic; decreased stance time R LE; decreased L step length; step to gait pattern; vc's required for R quad set during R stance phase (mild R knee flexion motion noted otherwise during R stance  phase)   Stairs            Wheelchair Mobility    Modified Rankin (Stroke Patients Only)       Balance Overall balance assessment: Needs assistance Sitting-balance support: No upper extremity supported Sitting balance-Leahy Scale: Normal     Standing balance support: Bilateral upper extremity supported Standing balance-Leahy Scale: Good                      Cognition Arousal/Alertness: Awake/alert Behavior During Therapy: WFL for tasks assessed/performed Overall Cognitive Status: Within Functional Limits for tasks assessed                      Exercises Total Joint Exercises Goniometric ROM: R knee extension 13 degrees short of neutral semi-supine; R knee flexion 79 degrees in sitting  Performed sitting exercises x 15 reps B LE's:  Heel/toe raises (AROM R; AROM L); LAQ's (AAROM R; AROM L); marching/hip flexion (AROM R; AROM L).  Pt required vc's and tactile cues for correct technique with exercises.  Pt and pt's wife also educated on positioning and movement to provide optimal R knee flexion and extension ROM.  Assessed pt's borrowed RW:  RW height appears appropriate but therapist has concerns regarding RW structural stability; care management notified pt requiring new RW for safe discharge home (pt and pt's wife agreeable to this).     General Comments General comments (skin integrity, edema, etc.): R knee dressing intact with minimal drainage  noted        Pertinent Vitals/Pain Pain Assessment: 0-10 Pain Score: 4  (2/10 end of session) Pain Location: R knee Pain Descriptors / Indicators: Sore;Tender Pain Intervention(s): Limited activity within patient's tolerance;Monitored during session;Premedicated before session;Repositioned;Ice applied  During session pt's HR 66-75 bpm and O2 >91% on room air.    Home Living                      Prior Function            PT Goals (current goals can now be found in the care plan section) Acute  Rehab PT Goals Patient Stated Goal: To go home PT Goal Formulation: With patient Time For Goal Achievement: 09/14/14 Potential to Achieve Goals: Good Progress towards PT goals: Progressing toward goals    Frequency  BID    PT Plan Current plan remains appropriate    Co-evaluation             End of Session Equipment Utilized During Treatment: Gait belt Activity Tolerance: Patient tolerated treatment well Patient left: in chair;with call bell/phone within reach;with chair alarm set;with family/visitor present;with SCD's reapplied (B heels elevated via towel rolls; pillow placed lateral to R hip to prevent R LE ER; polar care applied)     Time: 0857-1000 PT Time Calculation (min) (ACUTE ONLY): 63 min  Charges:  $Gait Training: 23-37 mins $Therapeutic Exercise: 8-22 mins $Therapeutic Activity: 8-22 mins                    G CodesLeitha Bleak 09-22-2014, 11:18 AM Leitha Bleak, PT [phone]

## 2014-09-01 NOTE — Discharge Instructions (Signed)

## 2014-09-01 NOTE — Progress Notes (Signed)
   Subjective: 2 Days Post-Op Procedure(s) (LRB): TOTAL KNEE ARTHROPLASTY (Right) Patient reports pain as 4 on 0-10 scale.  Mild Nausea this am. Did not need PCA last night. Patient is well this am. Last night severe pain. We will continue with therapy today.  Plan is to go Home after hospital stay.  Objective: Vital signs in last 24 hours: Temp:  [98.2 F (36.8 C)-99.2 F (37.3 C)] 98.6 F (37 C) (07/28 0417) Pulse Rate:  [61-73] 71 (07/28 0417) Resp:  [18-24] 18 (07/28 0417) BP: (156-171)/(71-92) 169/85 mmHg (07/28 0417) SpO2:  [92 %-98 %] 97 % (07/28 0417)  Intake/Output from previous day: 07/27 0701 - 07/28 0700 In: 2138.3 [P.O.:240; I.V.:1898.3] Out: 3000 [Urine:3000] Intake/Output this shift:     Recent Labs  08/30/14 1145 08/31/14 0605 09/01/14 0523  HGB 13.6 14.3 14.0    Recent Labs  08/31/14 0605 09/01/14 0523  WBC 10.7* 9.8  RBC 4.61 4.48  HCT 41.9 40.2  PLT 161 151    Recent Labs  08/30/14 1145 08/31/14 0605  NA  --  133*  K  --  4.0  CL  --  97*  CO2  --  25  BUN  --  9  CREATININE 0.85 0.77  GLUCOSE  --  257*  CALCIUM  --  8.4*    Recent Labs  08/30/14 0638  INR 0.95    EXAM General - Patient is Alert, Appropriate and Oriented Extremity - Neurovascular intact Sensation intact distally Intact pulses distally Dorsiflexion/Plantar flexion intact Dressing - dressing C/D/I and no drainage, changed to honeycomb Motor Function - intact, moving foot and toes well on exam.   Past Medical History  Diagnosis Date  . Arthritis   . Diabetes mellitus without complication   . Hypertension   . Anxiety   . Myocardial infarction 2008    Assessment/Plan:   2 Days Post-Op Procedure(s) (LRB): TOTAL KNEE ARTHROPLASTY (Right) Active Problems:   Primary osteoarthritis of knee  Estimated body mass index is 36.9 kg/(m^2) as calculated from the following:   Height as of this encounter: 6\' 3"  (1.905 m).   Weight as of this encounter:  133.902 kg (295 lb 3.2 oz). Advance diet Up with therapy  Needs BM   DVT Prophylaxis - Lovenox, Foot Pumps and TED hose Weight-Bearing as tolerated to right leg D/C O2 and Pulse OX and try on Room Air  T. Rachelle Hora, PA-C [place] 09/01/2014, 7:17 AM

## 2014-09-01 NOTE — Discharge Summary (Signed)
Physician Discharge Summary  Patient ID: Kevin Perry MRN: [id number] DOB/AGE: [birth year] 64 y.o.  Admit date: 08/30/2014 Discharge date: 09/01/2014  Admission Diagnoses:  OA   Discharge Diagnoses: Patient Active Problem List   Diagnosis Date Noted  . Primary osteoarthritis of knee 08/30/2014    Past Medical History  Diagnosis Date  . Arthritis   . Diabetes mellitus without complication   . Hypertension   . Anxiety   . Myocardial infarction 2008    Discharged Condition: Improved  Hospital Course: Kevin Perry is an 64 y.o. male who was admitted 08/30/2014 with a diagnosis of right knee osteoarthritis and went to the operating room on 08/30/2014 and underwent the above named procedures.    Surgeries: Procedure(s): TOTAL KNEE ARTHROPLASTY on 08/30/2014 Patient tolerated the surgery well. Taken to PACU where she was stabilized and then transferred to the orthopedic floor.  Started on Lovenox 40 q 12 hrs. Foot pumps applied bilaterally at 80 mm. Heels elevated on bed with rolled towels. No evidence of DVT. Negative Homan. Physical therapy started on day #1 for gait training and transfer. OT started day #1 for ADL and assisted devices.  Patient's foley was d/c on day #1. Patient's IV and hemovac was d/c on day #2.  On post op day #3 patient was stable and ready for discharge to Home with Home health PT.  Implants: Medacta GMK sphere 6+ femur 5 tibia 10 mm insert 3 patella all components cemented  He was given perioperative antibiotics:  Anti-infectives    Start     Dose/Rate Route Frequency Ordered Stop   08/30/14 1100  ceFAZolin (ANCEF) IVPB 2 g/50 mL premix     2 g 100 mL/hr over 30 Minutes Intravenous Every 6 hours 08/30/14 1057 08/31/14 0013   08/30/14 0530  ceFAZolin (ANCEF) 3 g in dextrose 5 % 50 mL IVPB     3 g 160 mL/hr over 30 Minutes Intravenous  Once 08/30/14 0519 08/30/14 0741    .  He was given sequential compression devices, early ambulation, and  lovenox for DVT prophylaxis.  He benefited maximally from the hospital stay and there were no complications.    Recent vital signs:  Filed Vitals:   09/01/14 0757  BP: 149/81  Pulse: 68  Temp: 98 F (36.7 C)  Resp: 18    Recent laboratory studies:  Lab Results  Component Value Date   HGB 14.0 09/01/2014   HGB 14.3 08/31/2014   HGB 13.6 08/30/2014   Lab Results  Component Value Date   WBC 9.8 09/01/2014   PLT 151 09/01/2014   Lab Results  Component Value Date   INR 0.95 08/30/2014   Lab Results  Component Value Date   NA 133* 08/31/2014   K 4.0 08/31/2014   CL 97* 08/31/2014   CO2 25 08/31/2014   BUN 9 08/31/2014   CREATININE 0.77 08/31/2014   GLUCOSE 257* 08/31/2014    Discharge Medications:     Medication List    STOP taking these medications        HYDROcodone-acetaminophen 10-325 MG per tablet  Commonly known as:  NORCO      TAKE these medications        amLODipine 10 MG tablet  Commonly known as:  NORVASC  Take 10 mg by mouth every morning.     citalopram 20 MG tablet  Commonly known as:  CELEXA  Take 20 mg by mouth every morning.     enoxaparin 40 MG/0.4ML injection  Commonly known as:  LOVENOX  Inject 0.4 mLs (40 mg total) into the skin daily. X 14 days     furosemide 40 MG tablet  Commonly known as:  LASIX  Take 40 mg by mouth every morning.     glipiZIDE 10 MG tablet  Commonly known as:  GLUCOTROL  Take 10 mg by mouth 2 (two) times daily before a meal.     lisinopril 20 MG tablet  Commonly known as:  PRINIVIL,ZESTRIL  Take 20 mg by mouth every morning.     loratadine 10 MG tablet  Commonly known as:  CLARITIN  Take 10 mg by mouth every morning.     metoprolol succinate 50 MG 24 hr tablet  Commonly known as:  TOPROL-XL  Take 50 mg by mouth every morning. Take with or immediately following a meal.     ONGLYZA 5 MG Tabs tablet  Generic drug:  saxagliptin HCl  Take 5 mg by mouth every morning.     oxyCODONE 5 MG immediate  release tablet  Commonly known as:  Oxy IR/ROXICODONE  Take 1-2 tablets (5-10 mg total) by mouth every 3 (three) hours as needed for breakthrough pain.     rosuvastatin 10 MG tablet  Commonly known as:  CRESTOR  Take 10 mg by mouth every morning.        Diagnostic Studies: Dg Knee 1-2 Views Right  08/30/2014   CLINICAL DATA:  64 year old male post knee replacement. Arthritis. Subsequent encounter.  EXAM: RIGHT KNEE - 1-2 VIEW  COMPARISON:  None.  FINDINGS: Post total right knee replacement and resurfacing of the patella. There is slight angulation of the right tibial component which may represent expected finding. Clinical correlation recommended.  IMPRESSION: Post total right knee replacement and resurfacing of the patella. There is slight angulation of the right tibial component which may represent expected finding. Clinical correlation recommended.   Electronically Signed   By: Genia Del M.D.   On: 08/30/2014 10:27    Disposition: Stable, discharge home with home health physical therapy        Follow-up Information    Follow up with MENZ,MICHAEL, MD In 2 weeks.   Specialty:  Orthopedic Surgery   Why:  For staple removal and skin check   Contact information:   [place] [address] [phone]        Signed: Feliberto Gottron 09/01/2014, 9:57 AM

## 2014-09-01 NOTE — Care Management Note (Signed)
Case Management Note  Patient Details  Name: ONDRA DEBOARD MRN: [id number] Date of Birth: [birth year]  Subjective/Objective:                  Patient up in chair, wife at bedside.  PT recommends that patient be provided a new rolling walker.  Action/Plan: Patient chooses [facility] for DME and home care services.  [facility] with [facility] notified and walker to be delivered to room. Corene Cornea with [facility] notified of new referral.  I called [facility] on [address], Lovenox is ready for pick up with a cost of $8.  I notified patient of rx being ready and cost.    Expected Discharge Date:                  Expected Discharge Plan:     In-House Referral:     Discharge planning Services  CM Consult  Post Acute Care Choice:  Durable Medical Equipment, Home Health Choice offered to:  Patient  DME Arranged:    DME Agency:     HH Arranged:  PT HH Agency:     Status of Service:  In process, will continue to follow  Medicare Important Message Given:    Date Medicare IM Given:    Medicare IM give by:    Date Additional Medicare IM Given:    Additional Medicare Important Message give by:     If discussed at [place] of Stay Meetings, dates discussed:    Additional Comments:  Beverly Sessions, RN 09/01/2014, 10:37 AM

## 2014-09-01 NOTE — Progress Notes (Signed)
Occupational Therapy Treatment Patient Details Name: Kevin Perry MRN: [id number] DOB: Jul 23, 1950 Today's Date: 09/01/2014    History of present illness This patient is a 64 year old male who came to [facility] for a R TKR 08/30/14.   OT comments  Patient fully dressed upon arrival.   Follow Up Recommendations  No OT follow up    Equipment Recommendations       Recommendations for Other Services      Precautions / Restrictions Precautions Precautions: Fall Restrictions Weight Bearing Restrictions: Yes RLE Weight Bearing: Weight bearing as tolerated       Mobility Bed Mobility                Transfers             Balance                         ADL                                         General ADL Comments: Had been independent, now needs assist. Patient showed techniques for lower body dressing. He used the reacher to donn pants and doff socks and sock aid to donn socks. He needed contact guard assist and verbal cues.      Vision                     Perception     Praxis      Cognition   Behavior During Therapy: WFL for tasks assessed/performed Overall Cognitive Status: Within Functional Limits for tasks assessed                       Extremity/Trunk Assessment               Exercises Total Joint Exercises Goniometric ROM: R knee extension 13 degrees short of neutral semi-supine; R knee flexion 79 degrees in sitting   Shoulder Instructions       General Comments      Pertinent Vitals/ Pain       Pain Assessment: 0-10 Pain Score: 3  Pain Location: R knee Pain Descriptors / Indicators: Sore;Tender Pain Intervention(s): Cold applied  Home Living                                          Prior Functioning/Environment              Frequency       Progress Toward Goals  OT Goals(current goals can now be found in the care plan section)  Progress towards OT  goals: Progressing toward goals  Acute Rehab OT Goals Patient Stated Goal: To go home  Plan      Co-evaluation                 End of Session Equipment Utilized During Treatment:  (hip kit)   Activity Tolerance Patient tolerated treatment well   Patient Left in chair;with call bell/phone within reach;with chair alarm set   Nurse Communication          Time: 9371-6967 OT Time Calculation (min): 16 min  Charges: OT General Charges $OT Visit: 1 Procedure OT Treatments $Self Care/Home Management : 8-22 mins  Kevin Perry,  Kevin Song, MS/OTR/L  09/01/2014, 11:45 AM

## 2014-09-02 LAB — SURGICAL PATHOLOGY

## 2014-09-02 NOTE — Care Management Note (Signed)
Case Management Note  Patient Details  Name: Kevin Perry MRN: [id number] Date of Birth: 1950/05/22  Subjective/Objective:               Patient up in chair, wife at bedside.  RW from [place] at Bedside    Action/Plan: Wife to pick up Lovenox today.  Corene Cornea from [facility] notified of discharge order for home health services. No further RN CM needs.  Case closed  Expected Discharge Date:                  Expected Discharge Plan:     In-House Referral:     Discharge planning Services  CM Consult  Post Acute Care Choice:  Durable Medical Equipment, Home Health Choice offered to:  Patient  DME Arranged:    DME Agency:     HH Arranged:  PT HH Agency:     Status of Service:  In process, will continue to follow  Medicare Important Message Given:    Date Medicare IM Given:    Medicare IM give by:    Date Additional Medicare IM Given:    Additional Medicare Important Message give by:     If discussed at [place] of Stay Meetings, dates discussed:    Additional Comments:  Beverly Sessions, RN 09/02/2014, 8:59 AM

## 2014-09-02 NOTE — Progress Notes (Signed)
Physical Therapy Treatment Patient Details Name: Kevin Perry MRN: [id number] DOB: 02-24-1950 Today's Date: 09/02/2014    History of Present Illness This patient is a 64 year old male who came to [facility] for a R TKR 08/30/14.    PT Comments    Pt's R knee buckled on 1st step attempting stairs again this morning so trialed R KI and pt then steady and safe with stairs with CGA x1 and B railings (pt's wife educated on donning/doffing R KI and appearing with excellent understanding--she reports having a lot of practice d/t her mom requiring KI as well at one point and already brought hospital KI to the car).  Pt still with some mild R knee flexion with functional mobility but no buckling noted with transfers or ambulation using RW.  Recommend pt discharge home with assist for all functional mobility for safety (SBA) using RW (pt's wife reports she can provide this and will get a 2nd assist on stairs for safety as well as use R KI on stairs for safety).  Pt to discharge home today with HHPT.  Follow Up Recommendations  Home health PT;Supervision for mobility/OOB     Equipment Recommendations  Rolling walker with 5" wheels;3in1 (PT)    Recommendations for Other Services       Precautions / Restrictions Precautions Precautions: Fall Restrictions Weight Bearing Restrictions: Yes RLE Weight Bearing: Weight bearing as tolerated    Mobility  Bed Mobility               General bed mobility comments: Deferred d/t pt sitting up in recliner upon arrival and requesting to sit in recliner end of session.  Transfers Overall transfer level: Needs assistance Equipment used: Rolling walker (2 wheeled) Transfers: Sit to/from Stand Sit to Stand: Supervision         General transfer comment: steady with standing using RW; no vc's required for hand or feet positioning  Ambulation/Gait Ambulation/Gait assistance: Supervision Ambulation Distance (Feet): 100 Feet Assistive device: Rolling  walker (2 wheeled)       General Gait Details: antalgic; decreased stance time R LE; decreased L step length; step to gait pattern; pt's wife able to give occasional vc's required for R quad set during R stance phase (mild R knee flexion noted otherwise during R stance phase)   Stairs Stairs: Yes Stairs assistance: +2 safety/equipment (mod assist x2 first step; CGA x2 up 3 and down 4; then CGA up/down 4) Stair Management: Two rails;Step to pattern;Forwards Number of Stairs:  (4 stairs x2 trials) General stair comments: pt's R knee buckling first step requiring 2 assist to steady; pt with mild R knee buckling navigating up next 3 stairs and was steady down 4 stairs; R KI donned and pt steady and safe navigating 4 stairs with B railing and CGA  Wheelchair Mobility    Modified Rankin (Stroke Patients Only)       Balance   Sitting-balance support: Feet supported;No upper extremity supported Sitting balance-Leahy Scale: Normal     Standing balance support: Bilateral upper extremity supported Standing balance-Leahy Scale: Good Standing balance comment: No loss of balance noted during session                    Cognition Arousal/Alertness: Awake/alert Behavior During Therapy: WFL for tasks assessed/performed Overall Cognitive Status: Within Functional Limits for tasks assessed                      Exercises Total Joint Exercises  Goniometric ROM: R knee extension 8 degrees short of neutral semi-supine; R knee flexion 94 degrees in sitting    General Comments General comments (skin integrity, edema, etc.): some drainage noted on R knee dressing prior to starting PT session  Reviewed R knee positioning to optimize R knee flexion and extension ROM (pt and pt's wife verbalize good understanding)      Pertinent Vitals/Pain Pain Assessment: 0-10 Pain Score: 2  Pain Location: R knee Pain Descriptors / Indicators: Sore;Tender Pain Intervention(s): Limited activity  within patient's tolerance;Monitored during session;Repositioned  Vitals stable and WFL throughout treatment session.    Home Living                      Prior Function            PT Goals (current goals can now be found in the care plan section) Acute Rehab PT Goals Patient Stated Goal: To go home PT Goal Formulation: With patient Time For Goal Achievement: 09/14/14 Potential to Achieve Goals: Good Progress towards PT goals: Progressing toward goals    Frequency  BID    PT Plan Current plan remains appropriate    Co-evaluation             End of Session Equipment Utilized During Treatment: Gait belt Activity Tolerance: Patient tolerated treatment well Patient left: in chair;with call bell/phone within reach;with nursing/sitter in room;with family/visitor present     Time: 7014-1030 PT Time Calculation (min) (ACUTE ONLY): 54 min  Charges:  $Gait Training: 23-37 mins $Therapeutic Exercise: 8-22 mins $Therapeutic Activity: 8-22 mins                    G CodesLeitha Bleak 09-26-2014, 12:14 PM Leitha Bleak, [place]

## 2014-09-02 NOTE — Progress Notes (Signed)
   Subjective: 3 Days Post-Op Procedure(s) (LRB): TOTAL KNEE ARTHROPLASTY (Right) Patient reports pain as mild. Patient is well this am. No complaints We will continue with therapy today.  Plan is to go Home after PT today.  Objective: Vital signs in last 24 hours: Temp:  [98 F (36.7 C)-99 F (37.2 C)] 98.6 F (37 C) (07/29 0725) Pulse Rate:  [56-70] 70 (07/29 0725) Resp:  [16-18] 16 (07/29 0725) BP: (106-149)/(44-81) 134/63 mmHg (07/29 0725) SpO2:  [91 %-100 %] 100 % (07/29 0725)  Intake/Output from previous day: 07/28 0701 - 07/29 0700 In: 720 [P.O.:720] Out: -  Intake/Output this shift:     Recent Labs  08/30/14 1145 08/31/14 0605 09/01/14 0523  HGB 13.6 14.3 14.0    Recent Labs  08/31/14 0605 09/01/14 0523  WBC 10.7* 9.8  RBC 4.61 4.48  HCT 41.9 40.2  PLT 161 151    Recent Labs  08/30/14 1145 08/31/14 0605  NA  --  133*  K  --  4.0  CL  --  97*  CO2  --  25  BUN  --  9  CREATININE 0.85 0.77  GLUCOSE  --  257*  CALCIUM  --  8.4*   No results for input(s): LABPT, INR in the last 72 hours.  EXAM General - Patient is Alert, Appropriate and Oriented Extremity - Neurovascular intact Sensation intact distally Intact pulses distally Dorsiflexion/Plantar flexion intact Dressing - CDI, scant drainage Motor Function - intact, moving foot and toes well on exam.   Past Medical History  Diagnosis Date  . Arthritis   . Diabetes mellitus without complication   . Hypertension   . Anxiety   . Myocardial infarction 2008    Assessment/Plan:   3 Days Post-Op Procedure(s) (LRB): TOTAL KNEE ARTHROPLASTY (Right) Active Problems:   Primary osteoarthritis of knee  Estimated body mass index is 36.9 kg/(m^2) as calculated from the following:   Height as of this encounter: 6\' 3"  (1.905 m).   Weight as of this encounter: 133.902 kg (295 lb 3.2 oz). Advance diet Up with therapy  Discharge home with HHPT today, follow up with [facility] in 2 weeks   DVT  Prophylaxis - Lovenox, Foot Pumps and TED hose Weight-Bearing as tolerated to right leg D/C O2 and Pulse OX and try on Room Air  T. Rachelle Hora, PA-C [place] 09/02/2014, 7:55 AM

## 2014-09-20 ENCOUNTER — Telehealth: Payer: Self-pay | Admitting: Family Medicine

## 2014-09-20 DIAGNOSIS — F419 Anxiety disorder, unspecified: Secondary | ICD-10-CM | POA: Insufficient documentation

## 2014-09-20 MED ORDER — CITALOPRAM HYDROBROMIDE 20 MG PO TABS: 20.0000 mg | ORAL_TABLET | ORAL | Status: DC

## 2014-09-20 NOTE — Telephone Encounter (Signed)
Pt needs refill on Citalopram to be sent to [facility] in [place].

## 2014-09-29 ENCOUNTER — Ambulatory Visit: Payer: Self-pay | Admitting: Family Medicine

## 2014-09-29 ENCOUNTER — Ambulatory Visit (INDEPENDENT_AMBULATORY_CARE_PROVIDER_SITE_OTHER): Payer: Managed Care, Other (non HMO) | Admitting: Family Medicine

## 2014-09-29 ENCOUNTER — Encounter: Payer: Self-pay | Admitting: Family Medicine

## 2014-09-29 VITALS — BP 131/70 | HR 88 | Temp 98.0°F | Resp 19 | Ht 76.0 in | Wt 253.5 lb

## 2014-09-29 DIAGNOSIS — F325 Major depressive disorder, single episode, in full remission: Secondary | ICD-10-CM | POA: Insufficient documentation

## 2014-09-29 DIAGNOSIS — R2243 Localized swelling, mass and lump, lower limb, bilateral: Secondary | ICD-10-CM

## 2014-09-29 DIAGNOSIS — E119 Type 2 diabetes mellitus without complications: Secondary | ICD-10-CM | POA: Diagnosis not present

## 2014-09-29 DIAGNOSIS — I10 Essential (primary) hypertension: Secondary | ICD-10-CM | POA: Diagnosis not present

## 2014-09-29 DIAGNOSIS — F329 Major depressive disorder, single episode, unspecified: Secondary | ICD-10-CM | POA: Diagnosis not present

## 2014-09-29 DIAGNOSIS — F331 Major depressive disorder, recurrent, moderate: Secondary | ICD-10-CM | POA: Insufficient documentation

## 2014-09-29 DIAGNOSIS — I872 Venous insufficiency (chronic) (peripheral): Secondary | ICD-10-CM | POA: Insufficient documentation

## 2014-09-29 DIAGNOSIS — E785 Hyperlipidemia, unspecified: Secondary | ICD-10-CM

## 2014-09-29 DIAGNOSIS — E1169 Type 2 diabetes mellitus with other specified complication: Secondary | ICD-10-CM | POA: Insufficient documentation

## 2014-09-29 DIAGNOSIS — F32A Depression, unspecified: Secondary | ICD-10-CM

## 2014-09-29 MED ORDER — FUROSEMIDE 40 MG PO TABS
40.0000 mg | ORAL_TABLET | ORAL | Status: DC
Start: 2014-09-29 — End: 2015-03-09

## 2014-09-29 MED ORDER — AMLODIPINE BESYLATE 10 MG PO TABS: 10.0000 mg | ORAL_TABLET | ORAL | Status: DC

## 2014-09-29 MED ORDER — METOPROLOL SUCCINATE ER 50 MG PO TB24: 50.0000 mg | ORAL_TABLET | ORAL | Status: DC

## 2014-09-29 MED ORDER — GLIPIZIDE 10 MG PO TABS: 10.0000 mg | ORAL_TABLET | Freq: Two times a day (BID) | ORAL | Status: DC

## 2014-09-29 MED ORDER — ROSUVASTATIN CALCIUM 10 MG PO TABS: 10.0000 mg | ORAL_TABLET | ORAL | Status: DC

## 2014-09-29 MED ORDER — SAXAGLIPTIN HCL 5 MG PO TABS: 5.0000 mg | ORAL_TABLET | ORAL | Status: DC

## 2014-09-29 MED ORDER — LISINOPRIL 20 MG PO TABS: 20.0000 mg | ORAL_TABLET | ORAL | Status: DC

## 2014-09-29 NOTE — Progress Notes (Signed)
Name: Kevin Perry   MRN: [id number]    DOB: [birth year]   Date:09/29/2014       Progress Note  Subjective  Chief Complaint  Chief Complaint  Patient presents with  . Follow-up    3 mo.  . Diabetes  . Hypertension  . Medication Refill    All Medication needs to be refilled    Diabetes He presents for his follow-up diabetic visit. He has type 2 diabetes mellitus. Pertinent negatives for hypoglycemia include no headaches or nervousness/anxiousness. Pertinent negatives for diabetes include no chest pain, no fatigue, no polydipsia and no polyuria. Diabetic complications include heart disease. Current diabetic treatment includes oral agent (dual therapy). He is following a diabetic diet. His breakfast blood glucose range is generally 130-140 mg/dl. An ACE inhibitor/angiotensin II receptor blocker is being taken. Eye exam is current.  Hypertension This is a chronic problem. The problem is controlled. Pertinent negatives include no chest pain, headaches, palpitations or shortness of breath. Past treatments include beta blockers, ACE inhibitors and calcium channel blockers. Hypertensive end-organ damage includes CAD/MI.  Hyperlipidemia This is a chronic problem. The problem is controlled. Pertinent negatives include no chest pain, leg pain, myalgias or shortness of breath. Current antihyperlipidemic treatment includes statins. The current treatment provides no improvement of lipids.  Depression        This is a chronic problem.  Associated symptoms include decreased interest (noticed loss of interest in activities that he previously used to enjoy.) and appetite change.  Associated symptoms include no fatigue, no myalgias, no headaches and not sad.  Past treatments include SSRIs - Selective serotonin reuptake inhibitors.  Compliance with treatment is good.  Previous treatment provided moderate relief.   Past Medical History  Diagnosis Date  . Arthritis   . Diabetes mellitus without complication    . Hypertension   . Anxiety   . Myocardial infarction 2008    Past Surgical History  Procedure Laterality Date  . Gallbladder surgery    . Knee surery Right   . Foot surgery Left     DOS 8.1.14 HALLIX IPJ FUSION, SECOND MET OSTEOTOMY W/SCREW, HAM TOE REPAIR 2,,4 , PARTIAL AMP 3RD DIGIT   . Cholecystectomy    . Coronary angioplasty with stent placement N/A 2008  . Total knee arthroplasty Right 08/30/2014    Procedure: TOTAL KNEE ARTHROPLASTY;  Surgeon: [name], MD;  Location: [facility] ORS;  Service: Orthopedics;  Laterality: Right;    History reviewed. No pertinent family history.  Social History   Social History  . Marital Status: Married    Spouse Name: N/A  . Number of Children: N/A  . Years of Education: N/A   Occupational History  . Not on file.   Social History Main Topics  . Smoking status: Former Smoker    Quit date: 08/16/2004  . Smokeless tobacco: Never Used     Comment: quit 20 plus years  . Alcohol Use: No     Comment: SOCIAL  . Drug Use: No  . Sexual Activity: Not on file   Other Topics Concern  . Not on file   Social History Narrative     Current outpatient prescriptions:  .  amLODipine (NORVASC) 10 MG tablet, Take 10 mg by mouth every morning. , Disp: , Rfl:  .  citalopram (CELEXA) 20 MG tablet, Take 1 tablet (20 mg total) by mouth every morning., Disp: 90 tablet, Rfl: 0 .  furosemide (LASIX) 40 MG tablet, Take 40 mg by mouth every morning. ,  Disp: , Rfl:  .  glipiZIDE (GLUCOTROL) 10 MG tablet, Take 10 mg by mouth 2 (two) times daily before a meal., Disp: , Rfl:  .  lisinopril (PRINIVIL,ZESTRIL) 20 MG tablet, Take 20 mg by mouth every morning. , Disp: , Rfl:  .  loratadine (CLARITIN) 10 MG tablet, Take 10 mg by mouth every morning., Disp: , Rfl:  .  metoprolol succinate (TOPROL-XL) 50 MG 24 hr tablet, Take 50 mg by mouth every morning. Take with or immediately following a meal., Disp: , Rfl:  .  oxyCODONE (OXY IR/ROXICODONE) 5 MG immediate  release tablet, Take 1-2 tablets (5-10 mg total) by mouth every 3 (three) hours as needed for breakthrough pain., Disp: 60 tablet, Rfl: 0 .  rosuvastatin (CRESTOR) 10 MG tablet, Take 10 mg by mouth every morning., Disp: , Rfl:  .  saxagliptin HCl (ONGLYZA) 5 MG TABS tablet, Take 5 mg by mouth every morning. , Disp: , Rfl:   Allergies  Allergen Reactions  . Naproxen Swelling and Anaphylaxis     Review of Systems  Constitutional: Positive for appetite change. Negative for fatigue.  Respiratory: Negative for shortness of breath.   Cardiovascular: Negative for chest pain and palpitations.  Musculoskeletal: Negative for myalgias.  Neurological: Negative for headaches.  Endo/Heme/Allergies: Negative for polydipsia.  Psychiatric/Behavioral: Positive for depression. The patient is not nervous/anxious.     Objective  Filed Vitals:   09/29/14 1347  BP: 131/70  Pulse: 88  Temp: 98 F (36.7 C)  TempSrc: Oral  Resp: 19  Height: '6\' 4"'  (1.93 m)  Weight: 253 lb 8 oz (114.987 kg)  SpO2: 94%    Physical Exam  Constitutional: He is oriented to person, place, and time and well-developed, well-nourished, and in no distress.  HENT:  Head: Normocephalic and atraumatic.  Cardiovascular: Normal rate and regular rhythm.   Pulmonary/Chest: Effort normal and breath sounds normal.  Musculoskeletal: He exhibits no edema.  Neurological: He is alert and oriented to person, place, and time.  Skin: Skin is warm and dry.  Psychiatric: Memory, affect and judgment normal.  Nursing note and vitals reviewed.  Assessment & Plan  1. Essential hypertension  - amLODipine (NORVASC) 10 MG tablet; Take 1 tablet (10 mg total) by mouth every morning.  Dispense: 90 tablet; Refill: 0 - lisinopril (PRINIVIL,ZESTRIL) 20 MG tablet; Take 1 tablet (20 mg total) by mouth every morning.  Dispense: 90 tablet; Refill: 0 - metoprolol succinate (TOPROL-XL) 50 MG 24 hr tablet; Take 1 tablet (50 mg total) by mouth every  morning. Take with or immediately following a meal.  Dispense: 90 tablet; Refill: 0 - Comprehensive metabolic panel  2. Diabetes mellitus without complication  - glipiZIDE (GLUCOTROL) 10 MG tablet; Take 1 tablet (10 mg total) by mouth 2 (two) times daily before a meal.  Dispense: 180 tablet; Refill: 0 - saxagliptin HCl (ONGLYZA) 5 MG TABS tablet; Take 1 tablet (5 mg total) by mouth every morning.  Dispense: 90 tablet; Refill: 0 - HgB A1c  3. Depression Patient asked to DC Celexa by slow taper and follow-up in 2 weeks to be started on a different antidepressant.  4. Hyperlipidemia  - rosuvastatin (CRESTOR) 10 MG tablet; Take 1 tablet (10 mg total) by mouth every morning.  Dispense: 90 tablet; Refill: 0 - Lipid Profile  5. Localized swelling of both lower legs  - furosemide (LASIX) 40 MG tablet; Take 1 tablet (40 mg total) by mouth every morning.  Dispense: 90 tablet; Refill: 0   Okey Dupre  [facility] 09/29/2014 2:04 PM

## 2014-10-12 ENCOUNTER — Ambulatory Visit (INDEPENDENT_AMBULATORY_CARE_PROVIDER_SITE_OTHER): Payer: Managed Care, Other (non HMO) | Admitting: Family Medicine

## 2014-10-12 ENCOUNTER — Encounter: Payer: Self-pay | Admitting: Family Medicine

## 2014-10-12 VITALS — BP 134/77 | HR 66 | Temp 98.4°F | Resp 18 | Ht 76.0 in | Wt 256.9 lb

## 2014-10-12 DIAGNOSIS — F329 Major depressive disorder, single episode, unspecified: Secondary | ICD-10-CM | POA: Diagnosis not present

## 2014-10-12 DIAGNOSIS — F32A Depression, unspecified: Secondary | ICD-10-CM

## 2014-10-12 MED ORDER — ESCITALOPRAM OXALATE 10 MG PO TABS: 10.0000 mg | ORAL_TABLET | Freq: Every day | ORAL | Status: DC

## 2014-10-12 NOTE — Progress Notes (Signed)
Name: Kevin Perry   MRN: [id number]    DOB: 12-May-1950   Date:10/12/2014       Progress Note  Subjective  Chief Complaint  Chief Complaint  Patient presents with  . Follow-up    2 wk medication check  . Diabetes  . Hypertension  . Hyperlipidemia  . Depression  . Anxiety    Depression        This is a chronic problem.  The onset quality is gradual.   Associated symptoms include irritable and decreased interest.  Past treatments include SSRIs - Selective serotonin reuptake inhibitors.  Compliance with treatment is good.  Previous treatment provided moderate relief. patient is here to be started on a different antidepressant. He was previously on Celexa 20 mg daily, which was tapered off for lack of efficacy.  Past Medical History  Diagnosis Date  . Arthritis   . Diabetes mellitus without complication   . Hypertension   . Anxiety   . Myocardial infarction 2008    Past Surgical History  Procedure Laterality Date  . Gallbladder surgery    . Knee surery Right   . Foot surgery Left     DOS 8.1.14 HALLIX IPJ FUSION, SECOND MET OSTEOTOMY W/SCREW, HAM TOE REPAIR 2,,4 , PARTIAL AMP 3RD DIGIT   . Cholecystectomy    . Coronary angioplasty with stent placement N/A 2008  . Total knee arthroplasty Right 08/30/2014    Procedure: TOTAL KNEE ARTHROPLASTY;  Surgeon: [name], MD;  Location: [facility] ORS;  Service: Orthopedics;  Laterality: Right;    History reviewed. No pertinent family history.  Social History   Social History  . Marital Status: Married    Spouse Name: N/A  . Number of Children: N/A  . Years of Education: N/A   Occupational History  . Not on file.   Social History Main Topics  . Smoking status: Former Smoker    Quit date: 08/16/2004  . Smokeless tobacco: Never Used     Comment: quit 20 plus years  . Alcohol Use: No     Comment: SOCIAL  . Drug Use: No  . Sexual Activity: Not on file   Other Topics Concern  . Not on file   Social History Narrative      Current outpatient prescriptions:  .  amLODipine (NORVASC) 10 MG tablet, Take 1 tablet (10 mg total) by mouth every morning., Disp: 90 tablet, Rfl: 0 .  citalopram (CELEXA) 20 MG tablet, Take 1 tablet (20 mg total) by mouth every morning., Disp: 90 tablet, Rfl: 0 .  furosemide (LASIX) 40 MG tablet, Take 1 tablet (40 mg total) by mouth every morning., Disp: 90 tablet, Rfl: 0 .  glipiZIDE (GLUCOTROL) 10 MG tablet, Take 1 tablet (10 mg total) by mouth 2 (two) times daily before a meal., Disp: 180 tablet, Rfl: 0 .  lisinopril (PRINIVIL,ZESTRIL) 20 MG tablet, Take 1 tablet (20 mg total) by mouth every morning., Disp: 90 tablet, Rfl: 0 .  loratadine (CLARITIN) 10 MG tablet, Take 10 mg by mouth every morning., Disp: , Rfl:  .  metoprolol succinate (TOPROL-XL) 50 MG 24 hr tablet, Take 1 tablet (50 mg total) by mouth every morning. Take with or immediately following a meal., Disp: 90 tablet, Rfl: 0 .  oxyCODONE (OXY IR/ROXICODONE) 5 MG immediate release tablet, Take 1-2 tablets (5-10 mg total) by mouth every 3 (three) hours as needed for breakthrough pain., Disp: 60 tablet, Rfl: 0 .  rosuvastatin (CRESTOR) 10 MG tablet, Take 1 tablet (10  mg total) by mouth every morning., Disp: 90 tablet, Rfl: 0 .  saxagliptin HCl (ONGLYZA) 5 MG TABS tablet, Take 1 tablet (5 mg total) by mouth every morning., Disp: 90 tablet, Rfl: 0  Allergies  Allergen Reactions  . Naproxen Swelling and Anaphylaxis     Review of Systems  Psychiatric/Behavioral: Positive for depression.   Objective  Filed Vitals:   10/12/14 1441  BP: 134/77  Pulse: 66  Temp: 98.4 F (36.9 C)  TempSrc: Oral  Resp: 18  Height: '6\' 4"'  (1.93 m)  Weight: 256 lb 14.4 oz (116.529 kg)  SpO2: 95%    Physical Exam  Constitutional: He is oriented to person, place, and time and well-developed, well-nourished, and in no distress. He is irritable.  Cardiovascular: Normal rate and regular rhythm.   Pulmonary/Chest: Effort normal and breath  sounds normal.  Neurological: He is alert and oriented to person, place, and time.  Skin: Skin is warm and dry.  Psychiatric: Memory, affect and judgment normal.  Nursing note and vitals reviewed.  Assessment & Plan  1. Depression Started patient on Lexapro 10 mg daily. Educated on the potential adverse effects. Follow-up in 6 weeks. - escitalopram (LEXAPRO) 10 MG tablet; Take 1 tablet (10 mg total) by mouth daily.  Dispense: 30 tablet; Refill: 2   Elaine Middleton [facility] 10/12/2014 2:55 PM

## 2014-11-24 ENCOUNTER — Ambulatory Visit (INDEPENDENT_AMBULATORY_CARE_PROVIDER_SITE_OTHER): Payer: Managed Care, Other (non HMO) | Admitting: Family Medicine

## 2014-11-24 ENCOUNTER — Encounter: Payer: Self-pay | Admitting: Family Medicine

## 2014-11-24 VITALS — BP 120/76 | HR 70 | Temp 98.3°F | Resp 16 | Wt 264.4 lb

## 2014-11-24 DIAGNOSIS — F329 Major depressive disorder, single episode, unspecified: Secondary | ICD-10-CM | POA: Diagnosis not present

## 2014-11-24 DIAGNOSIS — F32A Depression, unspecified: Secondary | ICD-10-CM

## 2014-11-24 MED ORDER — ESCITALOPRAM OXALATE 20 MG PO TABS: 20.0000 mg | ORAL_TABLET | Freq: Every day | ORAL | Status: DC

## 2014-11-24 NOTE — Progress Notes (Signed)
Name: Kevin Perry   MRN: [id number]    DOB: [birth year]   Date:11/24/2014       Progress Note  Subjective  Chief Complaint  Chief Complaint  Patient presents with  . Medication Management    patient is here for a possible medication adjustment    Depression        This is a chronic problem.  Associated symptoms include decreased concentration.  Associated symptoms include no helplessness, no hopelessness, no decreased interest and no headaches.  Past treatments include SSRIs - Selective serotonin reuptake inhibitors.  Patient reports Lexapro 10 mg daily is working well, no reported side effects. Would like to increase the dosage if appropriate.  Past Medical History  Diagnosis Date  . Arthritis   . Diabetes mellitus without complication ([place])   . Hypertension   . Anxiety   . Myocardial infarction [facility]) 2008    Past Surgical History  Procedure Laterality Date  . Gallbladder surgery    . Knee surery Right   . Foot surgery Left     DOS 8.1.14 HALLIX IPJ FUSION, SECOND MET OSTEOTOMY W/SCREW, HAM TOE REPAIR 2,,4 , PARTIAL AMP 3RD DIGIT   . Cholecystectomy    . Coronary angioplasty with stent placement N/A 2008  . Total knee arthroplasty Right 08/30/2014    Procedure: TOTAL KNEE ARTHROPLASTY;  Surgeon: [name], MD;  Location: [facility] ORS;  Service: Orthopedics;  Laterality: Right;    History reviewed. No pertinent family history.  Social History   Social History  . Marital Status: Married    Spouse Name: N/A  . Number of Children: N/A  . Years of Education: N/A   Occupational History  . Not on file.   Social History Main Topics  . Smoking status: Former Smoker    Quit date: 08/16/2004  . Smokeless tobacco: Never Used     Comment: quit 20 plus years  . Alcohol Use: No     Comment: SOCIAL  . Drug Use: No  . Sexual Activity: Not on file   Other Topics Concern  . Not on file   Social History Narrative     Current outpatient prescriptions:  .  amLODipine  (NORVASC) 10 MG tablet, Take 1 tablet (10 mg total) by mouth every morning., Disp: 90 tablet, Rfl: 0 .  escitalopram (LEXAPRO) 10 MG tablet, Take 1 tablet (10 mg total) by mouth daily., Disp: 30 tablet, Rfl: 2 .  furosemide (LASIX) 40 MG tablet, Take 1 tablet (40 mg total) by mouth every morning., Disp: 90 tablet, Rfl: 0 .  glipiZIDE (GLUCOTROL) 10 MG tablet, Take 1 tablet (10 mg total) by mouth 2 (two) times daily before a meal., Disp: 180 tablet, Rfl: 0 .  lisinopril (PRINIVIL,ZESTRIL) 20 MG tablet, Take 1 tablet (20 mg total) by mouth every morning., Disp: 90 tablet, Rfl: 0 .  loratadine (CLARITIN) 10 MG tablet, Take 10 mg by mouth every morning., Disp: , Rfl:  .  metoprolol succinate (TOPROL-XL) 50 MG 24 hr tablet, Take 1 tablet (50 mg total) by mouth every morning. Take with or immediately following a meal., Disp: 90 tablet, Rfl: 0 .  oxyCODONE (OXY IR/ROXICODONE) 5 MG immediate release tablet, Take 1-2 tablets (5-10 mg total) by mouth every 3 (three) hours as needed for breakthrough pain., Disp: 60 tablet, Rfl: 0 .  rosuvastatin (CRESTOR) 10 MG tablet, Take 1 tablet (10 mg total) by mouth every morning., Disp: 90 tablet, Rfl: 0 .  saxagliptin HCl (ONGLYZA) 5 MG TABS tablet,  Take 1 tablet (5 mg total) by mouth every morning., Disp: 90 tablet, Rfl: 0  Allergies  Allergen Reactions  . Naproxen Swelling and Anaphylaxis   Review of Systems  Neurological: Negative for headaches.  Psychiatric/Behavioral: Positive for depression and decreased concentration.   Objective  Filed Vitals:   11/24/14 1537  BP: 120/76  Pulse: 70  Temp: 98.3 F (36.8 C)  TempSrc: Oral  Resp: 16  Weight: 264 lb 6.4 oz (119.931 kg)  SpO2: 98%    Physical Exam  Constitutional: He is oriented to person, place, and time and well-developed, well-nourished, and in no distress.  Cardiovascular: Normal rate, regular rhythm and normal heart sounds.   No murmur heard. Pulmonary/Chest: Effort normal and breath sounds  normal. He has no wheezes (soft expiratory wheezes on right lung).  Neurological: He is alert and oriented to person, place, and time.  Skin: Skin is warm and dry.  Psychiatric: Memory, affect and judgment normal.  Nursing note and vitals reviewed.   Assessment & Plan  1. Depression Symptoms responsive and improving on therapy. We will increase Lexapro to 20 mg daily. Reassess in 6 months. - escitalopram (LEXAPRO) 20 MG tablet; Take 1 tablet (20 mg total) by mouth at bedtime.  Dispense: 90 tablet; Refill: 1   Julietta Batterman [facility] 11/24/2014 4:45 PM

## 2015-01-06 ENCOUNTER — Other Ambulatory Visit: Payer: Self-pay | Admitting: Family Medicine

## 2015-01-10 ENCOUNTER — Other Ambulatory Visit: Payer: Self-pay | Admitting: Family Medicine

## 2015-02-08 ENCOUNTER — Other Ambulatory Visit: Payer: Self-pay | Admitting: Family Medicine

## 2015-03-09 ENCOUNTER — Ambulatory Visit (INDEPENDENT_AMBULATORY_CARE_PROVIDER_SITE_OTHER): Payer: Managed Care, Other (non HMO) | Admitting: Family Medicine

## 2015-03-09 ENCOUNTER — Encounter: Payer: Self-pay | Admitting: Family Medicine

## 2015-03-09 VITALS — BP 143/87 | HR 62 | Temp 98.2°F | Resp 16 | Ht 76.0 in | Wt 272.0 lb

## 2015-03-09 DIAGNOSIS — F329 Major depressive disorder, single episode, unspecified: Secondary | ICD-10-CM | POA: Diagnosis not present

## 2015-03-09 DIAGNOSIS — F32A Depression, unspecified: Secondary | ICD-10-CM

## 2015-03-09 DIAGNOSIS — R2243 Localized swelling, mass and lump, lower limb, bilateral: Secondary | ICD-10-CM

## 2015-03-09 DIAGNOSIS — I10 Essential (primary) hypertension: Secondary | ICD-10-CM

## 2015-03-09 DIAGNOSIS — F419 Anxiety disorder, unspecified: Secondary | ICD-10-CM

## 2015-03-09 DIAGNOSIS — I2510 Atherosclerotic heart disease of native coronary artery without angina pectoris: Secondary | ICD-10-CM | POA: Insufficient documentation

## 2015-03-09 DIAGNOSIS — E119 Type 2 diabetes mellitus without complications: Secondary | ICD-10-CM | POA: Diagnosis not present

## 2015-03-09 LAB — POCT UA - MICROALBUMIN
Albumin/Creatinine Ratio, Urine, POC: 0
Creatinine, POC: 0 mg/dL
Microalbumin Ur, POC: 0 mg/L

## 2015-03-09 LAB — POCT GLYCOSYLATED HEMOGLOBIN (HGB A1C): Hemoglobin A1C: 7.2

## 2015-03-09 MED ORDER — GLIPIZIDE 10 MG PO TABS: 10.0000 mg | ORAL_TABLET | Freq: Two times a day (BID) | ORAL | Status: DC

## 2015-03-09 MED ORDER — SAXAGLIPTIN HCL 5 MG PO TABS: 5.0000 mg | ORAL_TABLET | Freq: Every day | ORAL | Status: DC

## 2015-03-09 MED ORDER — FUROSEMIDE 40 MG PO TABS: 40.0000 mg | ORAL_TABLET | ORAL | Status: DC

## 2015-03-09 MED ORDER — AMLODIPINE BESYLATE 10 MG PO TABS: 10.0000 mg | ORAL_TABLET | Freq: Every day | ORAL | Status: DC

## 2015-03-09 MED ORDER — LISINOPRIL 20 MG PO TABS: 20.0000 mg | ORAL_TABLET | ORAL | Status: DC

## 2015-03-09 MED ORDER — METOPROLOL SUCCINATE ER 50 MG PO TB24: 50.0000 mg | ORAL_TABLET | Freq: Every day | ORAL | Status: DC

## 2015-03-09 MED ORDER — ESCITALOPRAM OXALATE 20 MG PO TABS: 20.0000 mg | ORAL_TABLET | Freq: Every day | ORAL | Status: DC

## 2015-03-09 NOTE — Assessment & Plan Note (Signed)
Renewed lasix for pedal edema.

## 2015-03-09 NOTE — Assessment & Plan Note (Signed)
Mildy elevated but pt is out of lisinopril. BP under good control with medication.  Pt will check at work and call if still elevated for medication titration. ACE for renal protection. CMET. Recheck 3 mos.

## 2015-03-09 NOTE — Progress Notes (Signed)
Subjective:    Patient ID: Kevin Perry, male    DOB: 06-18-1950, 65 y.o.   MRN: [id number]  HPI: Kevin Perry is a 65 y.o. male presenting on 03/09/2015 for Establish Care   HPI  Pt presents to establish care today. Previous care provider was [facility] with Dr. Manuella Ghazi.  It has been 4  months since His last PCP visit. Records from previous provider will be requested and reviewed. Current medical problems include:  Diabetes: Diagnosed several years ago. Taking glipizide twice daily and onglyza once daily. Last A1c was 7.2%- unsure when it was. No numbness in tingling in his feet. Eye exam October 2016- no retinopathy. Checks sugars once weekly.  Hypertension: Diagnosed > 15 years ago. Strong family history. Taking lisinopril 20mg , amlodipine 10mg ; metoprolol Previous MI: Had MI in 2008- Sees every 2 years. Was told to stay on lasix daily 40mg  by cardiology. Does not take aspirin daily. Was taken off by cardiology.  Cholesterol: Started on at [facility]. Started for high cholesterol. Taking crestor daily 10mg . No leg pain, chest pain.  Anxiety: Lexapro for 1 year, previously on citalopram. No worries. Doing well.   Health maintenance:  Never colonoscopy- no family history. Desires cologuard.  PSA: PSA's have been normal. Screens at the hospital.  TDAP: Walks daily- 1.5 miles daily.  Drinks - Diet pepsi 2-3 per day. Drinks mainly.    Past Medical History  Diagnosis Date  . Arthritis   . Diabetes mellitus without complication ([place])   . Hypertension   . Anxiety   . Myocardial infarction [facility]) 2008   Social History   Social History  . Marital Status: Married    Spouse Name: N/A  . Number of Children: N/A  . Years of Education: N/A   Occupational History  . Not on file.   Social History Main Topics  . Smoking status: Former Smoker    Quit date: 08/16/2004  . Smokeless tobacco: Never Used     Comment: quit 20 plus years  . Alcohol Use: No     Comment: SOCIAL  .  Drug Use: No  . Sexual Activity: Not on file   Other Topics Concern  . Not on file   Social History Narrative   No family history on file. Current Outpatient Prescriptions on File Prior to Visit  Medication Sig  . loratadine (CLARITIN) 10 MG tablet Take 10 mg by mouth every morning.  . rosuvastatin (CRESTOR) 10 MG tablet Take 1 tablet (10 mg total) by mouth every morning.   No current facility-administered medications on file prior to visit.    Review of Systems  Constitutional: Negative for fever and chills.  HENT: Negative.   Respiratory: Negative for chest tightness, shortness of breath and wheezing.   Cardiovascular: Negative for chest pain, palpitations and leg swelling.  Gastrointestinal: Negative for nausea, vomiting and abdominal pain.  Endocrine: Negative.   Genitourinary: Negative for dysuria, urgency, discharge, penile pain and testicular pain.  Musculoskeletal: Negative for back pain, joint swelling and arthralgias.  Skin: Negative.   Neurological: Negative for dizziness, weakness, numbness and headaches.  Psychiatric/Behavioral: Negative for sleep disturbance and dysphoric mood.   Per HPI unless specifically indicated above     Objective:    BP 143/87 mmHg  Pulse 62  Temp(Src) 98.2 F (36.8 C) (Oral)  Resp 16  Ht 6\' 4"  (1.93 m)  Wt 272 lb (123.378 kg)  BMI 33.12 kg/m2  Wt Readings from Last 3 Encounters:  03/09/15 272 lb (  123.378 kg)  11/24/14 264 lb 6.4 oz (119.931 kg)  10/12/14 256 lb 14.4 oz (116.529 kg)    GAD 7 : Generalized Anxiety Score 03/09/2015  Nervous, Anxious, on Edge 0  Control/stop worrying 0  Worry too much - different things 0  Trouble relaxing 0  Restless 0  Easily annoyed or irritable 0  Afraid - awful might happen 0  Total GAD 7 Score 0  Anxiety Difficulty Not difficult at all   Depression screen [facility] 2/9 03/09/2015 10/12/2014 09/29/2014  Decreased Interest 0 0 0  Down, Depressed, Hopeless 0 0 0  PHQ - 2 Score 0 0 0    Physical  Exam  Constitutional: He is oriented to person, place, and time. He appears well-developed and well-nourished. No distress.  HENT:  Head: Normocephalic and atraumatic.  Neck: Neck supple. No thyromegaly present.  Cardiovascular: Normal rate, regular rhythm and normal heart sounds.  Exam reveals no gallop and no friction rub.   No murmur heard. Pulmonary/Chest: Effort normal and breath sounds normal. He has no wheezes.  Abdominal: Soft. Bowel sounds are normal. He exhibits no distension. There is no tenderness. There is no rebound.  Musculoskeletal: Normal range of motion. He exhibits no edema or tenderness.  Neurological: He is alert and oriented to person, place, and time. He has normal reflexes.  Skin: Skin is warm and dry. No rash noted. No erythema.  Psychiatric: He has a normal mood and affect. His behavior is normal. Thought content normal.   Results for orders placed or performed in visit on 03/09/15  POCT HgB A1C  Result Value Ref Range   Hemoglobin A1C 7.2   POCT UA - Microalbumin  Result Value Ref Range   Microalbumin Ur, POC 0 mg/L   Creatinine, POC 0 mg/dL   Albumin/Creatinine Ratio, Urine, POC 0       Assessment & Plan:   Problem List Items Addressed This Visit      Cardiovascular and Mediastinum   Hypertension    Mildy elevated but pt is out of lisinopril. BP under good control with medication.  Pt will check at work and call if still elevated for medication titration. ACE for renal protection. CMET. Recheck 3 mos.       Relevant Medications   lisinopril (PRINIVIL,ZESTRIL) 20 MG tablet   metoprolol succinate (TOPROL-XL) 50 MG 24 hr tablet   furosemide (LASIX) 40 MG tablet   amLODipine (NORVASC) 10 MG tablet   Coronary artery disease involving native coronary artery without angina pectoris    Continue statin for secondary prevention. Pt will see cardiology this year. Discussed risk vs. Benefits of aspirin for secondary prevention.  Remains on Beta blocker and  ACE.       Relevant Medications   lisinopril (PRINIVIL,ZESTRIL) 20 MG tablet   metoprolol succinate (TOPROL-XL) 50 MG 24 hr tablet   furosemide (LASIX) 40 MG tablet   amLODipine (NORVASC) 10 MG tablet     Endocrine   Diabetes mellitus without complication (HCC) - Primary    A1c is 7.2% today. Steady from last per pt. Discussed strategies to reduce A1c to <7.0%- plan to use lifestyle interventions. Goal to reduce diet sodas and drink water only. Decrease breads, rice, pasta. Urine microalbumin negative. Eye exam due 11/2014. Foot exam at next visit.  CMET, lipid today.       Relevant Medications   lisinopril (PRINIVIL,ZESTRIL) 20 MG tablet   glipiZIDE (GLUCOTROL) 10 MG tablet   saxagliptin HCl (ONGLYZA) 5 MG TABS tablet  Other Relevant Orders   POCT HgB A1C (Completed)   Comprehensive metabolic panel   Lipid panel   POCT UA - Microalbumin (Completed)   Cologuard     Other   Anxiety    Overall doing well. Renew escitalopram today.       Relevant Medications   escitalopram (LEXAPRO) 20 MG tablet   Depression   Relevant Medications   escitalopram (LEXAPRO) 20 MG tablet   Localized swelling of both lower legs    Renewed lasix for pedal edema.       Relevant Medications   furosemide (LASIX) 40 MG tablet      Meds ordered this encounter  Medications  . escitalopram (LEXAPRO) 20 MG tablet    Sig: Take 1 tablet (20 mg total) by mouth at bedtime.    Dispense:  90 tablet    Refill:  3    Order Specific Question:  Supervising Provider    Answer:  Arlis Porta [phone]  . lisinopril (PRINIVIL,ZESTRIL) 20 MG tablet    Sig: Take 1 tablet (20 mg total) by mouth every morning.    Dispense:  90 tablet    Refill:  3    Order Specific Question:  Supervising Provider    Answer:  Arlis Porta [phone]  . glipiZIDE (GLUCOTROL) 10 MG tablet    Sig: Take 1 tablet (10 mg total) by mouth 2 (two) times daily before a meal.    Dispense:  180 tablet    Refill:  3     Order Specific Question:  Supervising Provider    Answer:  Arlis Porta [phone]  . saxagliptin HCl (ONGLYZA) 5 MG TABS tablet    Sig: Take 1 tablet (5 mg total) by mouth daily.    Dispense:  90 tablet    Refill:  3    Order Specific Question:  Supervising Provider    Answer:  Arlis Porta [phone]  . metoprolol succinate (TOPROL-XL) 50 MG 24 hr tablet    Sig: Take 1 tablet (50 mg total) by mouth daily. Take with or immediately following a meal.    Dispense:  90 tablet    Refill:  3    Order Specific Question:  Supervising Provider    Answer:  Arlis Porta [phone]  . furosemide (LASIX) 40 MG tablet    Sig: Take 1 tablet (40 mg total) by mouth every morning.    Dispense:  90 tablet    Refill:  3    Order Specific Question:  Supervising Provider    Answer:  Arlis Porta [phone]  . amLODipine (NORVASC) 10 MG tablet    Sig: Take 1 tablet (10 mg total) by mouth daily.    Dispense:  90 tablet    Refill:  3    Order Specific Question:  Supervising Provider    Answer:  Arlis Porta [phone]      Follow up plan: Return in about 3 months (around 06/06/2015) for diabetes.Marland Kitchen

## 2015-03-09 NOTE — Assessment & Plan Note (Signed)
Overall doing well. Renew escitalopram today.

## 2015-03-09 NOTE — Patient Instructions (Signed)
We will keep your diabetes medications the same. Try to reduce the diet sodas and increase water.   Check your BP periodically.    Your goal blood pressure is 140/90- please call if consistently >150/90. Work on low salt/sodium diet - goal <1.5gm (1,500mg ) per day. Eat a diet high in fruits/vegetables and whole grains.  Look into mediterranean and DASH diet. Goal activity is 174min/wk of moderate intensity exercise.  This can be split into 30 minute chunks.  If you are not at this level, you can start with smaller 10-15 min increments and slowly build up activity. Look at [facility] for more resources

## 2015-03-09 NOTE — Assessment & Plan Note (Signed)
Continue statin for secondary prevention. Pt will see cardiology this year. Discussed risk vs. Benefits of aspirin for secondary prevention.  Remains on Beta blocker and ACE.

## 2015-03-09 NOTE — Assessment & Plan Note (Signed)
A1c is 7.2% today. Steady from last per pt. Discussed strategies to reduce A1c to <7.0%- plan to use lifestyle interventions. Goal to reduce diet sodas and drink water only. Decrease breads, rice, pasta. Urine microalbumin negative. Eye exam due 11/2014. Foot exam at next visit.  CMET, lipid today.

## 2015-03-20 ENCOUNTER — Other Ambulatory Visit: Payer: Self-pay | Admitting: Family Medicine

## 2015-03-20 DIAGNOSIS — E785 Hyperlipidemia, unspecified: Secondary | ICD-10-CM

## 2015-03-20 MED ORDER — ROSUVASTATIN CALCIUM 10 MG PO TABS: 10.0000 mg | ORAL_TABLET | ORAL | Status: DC

## 2015-04-05 LAB — COLOGUARD: Cologuard: NEGATIVE

## 2015-04-25 ENCOUNTER — Telehealth: Payer: Self-pay | Admitting: Family Medicine

## 2015-04-25 NOTE — Telephone Encounter (Signed)
Informed patient his cologuard was normal.

## 2015-05-02 ENCOUNTER — Other Ambulatory Visit
Admission: RE | Admit: 2015-05-02 | Discharge: 2015-05-02 | Disposition: A | Discharge status: Home / Self Care | Payer: Managed Care, Other (non HMO) | Source: Ambulatory Visit | Attending: Family Medicine | Admitting: Family Medicine

## 2015-05-02 DIAGNOSIS — E119 Type 2 diabetes mellitus without complications: Secondary | ICD-10-CM | POA: Diagnosis present

## 2015-05-02 LAB — COMPREHENSIVE METABOLIC PANEL
ALT: 22 U/L (ref 17–63)
AST: 22 U/L (ref 15–41)
Albumin: 4 g/dL (ref 3.5–5.0)
Alkaline Phosphatase: 42 U/L (ref 38–126)
Anion gap: 7 (ref 5–15)
BUN: 17 mg/dL (ref 6–20)
CO2: 25 mmol/L (ref 22–32)
Calcium: 9 mg/dL (ref 8.9–10.3)
Chloride: 103 mmol/L (ref 101–111)
Creatinine, Ser: 0.91 mg/dL (ref 0.61–1.24)
GFR calc Af Amer: >60 mL/min (ref >60)
GFR calc non Af Amer: >60 mL/min (ref >60)
Glucose, Bld: 179 mg/dL — ABNORMAL HIGH (ref 65–99)
Potassium: 3.9 mmol/L (ref 3.5–5.1)
Sodium: 135 mmol/L (ref 135–145)
Total Bilirubin: 0.8 mg/dL (ref 0.3–1.2)
Total Protein: 6.7 g/dL (ref 6.5–8.1)

## 2015-05-02 LAB — LIPID PANEL
Cholesterol: 111 mg/dL (ref 0–200)
HDL: 38 mg/dL — ABNORMAL LOW (ref >40)
LDL Cholesterol: 34 mg/dL (ref 0–99)
Total CHOL/HDL Ratio: 2.9 RATIO
Triglycerides: 193 mg/dL — ABNORMAL HIGH (ref <150)
VLDL: 39 mg/dL (ref 0–40)

## 2015-06-13 ENCOUNTER — Ambulatory Visit (INDEPENDENT_AMBULATORY_CARE_PROVIDER_SITE_OTHER): Payer: Managed Care, Other (non HMO) | Admitting: Family Medicine

## 2015-06-13 ENCOUNTER — Encounter: Payer: Self-pay | Admitting: Family Medicine

## 2015-06-13 VITALS — BP 138/76 | HR 63 | Temp 98.4°F | Resp 16 | Ht 76.0 in | Wt 277.6 lb

## 2015-06-13 DIAGNOSIS — E785 Hyperlipidemia, unspecified: Secondary | ICD-10-CM | POA: Diagnosis not present

## 2015-06-13 DIAGNOSIS — I2510 Atherosclerotic heart disease of native coronary artery without angina pectoris: Secondary | ICD-10-CM

## 2015-06-13 DIAGNOSIS — IMO0001 Reserved for inherently not codable concepts without codable children: Secondary | ICD-10-CM

## 2015-06-13 DIAGNOSIS — E1165 Type 2 diabetes mellitus with hyperglycemia: Secondary | ICD-10-CM | POA: Diagnosis not present

## 2015-06-13 DIAGNOSIS — I10 Essential (primary) hypertension: Secondary | ICD-10-CM

## 2015-06-13 LAB — POCT GLYCOSYLATED HEMOGLOBIN (HGB A1C): Hemoglobin A1C: 7.7

## 2015-06-13 MED ORDER — LIRAGLUTIDE 18 MG/3ML ~~LOC~~ SOPN: PEN_INJECTOR | SUBCUTANEOUS | Status: DC

## 2015-06-13 MED ORDER — INSULIN PEN NEEDLE 31G X 5 MM MISC: 1.0000 | Freq: Every day | Status: DC

## 2015-06-13 NOTE — Assessment & Plan Note (Signed)
Add victoza injection once daily to help control sugars. Recommend 10 minutes exercise after each meal to aid in weight loss.  ACE for renal protection. Eye exam: due 11/2015. Foot exam UTD.  Recheck 3 mos.

## 2015-06-13 NOTE — Patient Instructions (Signed)
[email or web address]  Go to Victoza.com to get your savings card. STOP onglyza.  Start victoza- inject 0.6mg  under the skin once daily for 1 week then increase to 1.2mg  once daily for 1 week and then to 1.8mg  daily. That is the final dose.   Please call me when you know which meter you need to check blood sugars.  Please check your blood glucose 1-2 times daily. If your glucose is < 70 mg/dl or you have symptoms of hypoglycemia confusion, dizziness, hunger, jitteriness and sweating please drink 4 oz of juice or soda.  Check blood glucose 15 minutes later. If it has not risen to >100, please seek medical attention. If > 100 please eat a snack containing protein such as peanut butter and crackers.

## 2015-06-13 NOTE — Assessment & Plan Note (Signed)
Controlled in the office today. Continue current regimen. DASH diet reviewed. Recheck 3 mos.

## 2015-06-13 NOTE — Progress Notes (Signed)
Subjective:    Patient ID: Kevin Perry, male    DOB: 04-03-1950, 65 y.o.   MRN: [id number]  HPI: Kevin Perry is a 65 y.o. male presenting on 06/13/2015 for Diabetes   HPI  Pt presents for DM, HTN, and HLD follow-up. Diabetes: Average BG is 135. Highest is 150 and low 130. No hypoglycemia. Last A1c was 7.2%- up to 7.7%. Could not tolerate Metformin. Currently taking glucotrol and onglyza. No numbness or tingling in feet. Eye exam due in 11/2015. Meter is broken- needs a new glucometer.  HTN: BP is controlled at home. No HA, chest pain, or visual changes.  HLD: taking crestor daily. Aspirin for secondary prevention.   Past Medical History  Diagnosis Date  . Arthritis   . Diabetes mellitus without complication ([place])   . Hypertension   . Anxiety   . Myocardial infarction [facility]) 2008    Current Outpatient Prescriptions on File Prior to Visit  Medication Sig  . amLODipine (NORVASC) 10 MG tablet Take 1 tablet (10 mg total) by mouth daily.  Marland Kitchen escitalopram (LEXAPRO) 20 MG tablet Take 1 tablet (20 mg total) by mouth at bedtime.  . furosemide (LASIX) 40 MG tablet Take 1 tablet (40 mg total) by mouth every morning.  Marland Kitchen glipiZIDE (GLUCOTROL) 10 MG tablet Take 1 tablet (10 mg total) by mouth 2 (two) times daily before a meal.  . lisinopril (PRINIVIL,ZESTRIL) 20 MG tablet Take 1 tablet (20 mg total) by mouth every morning.  . loratadine (CLARITIN) 10 MG tablet Take 10 mg by mouth every morning.  . metoprolol succinate (TOPROL-XL) 50 MG 24 hr tablet Take 1 tablet (50 mg total) by mouth daily. Take with or immediately following a meal.  . rosuvastatin (CRESTOR) 10 MG tablet Take 1 tablet (10 mg total) by mouth every morning.   No current facility-administered medications on file prior to visit.    Review of Systems  Constitutional: Negative for fever and chills.  HENT: Negative.   Respiratory: Negative for chest tightness, shortness of breath and wheezing.   Cardiovascular: Negative  for chest pain, palpitations and leg swelling.  Gastrointestinal: Negative for nausea, vomiting and abdominal pain.  Endocrine: Negative.   Genitourinary: Negative for dysuria, urgency, discharge, penile pain and testicular pain.  Musculoskeletal: Negative for back pain, joint swelling and arthralgias.  Skin: Negative.   Neurological: Negative for dizziness, weakness, numbness and headaches.  Psychiatric/Behavioral: Negative for sleep disturbance and dysphoric mood.   Per HPI unless specifically indicated above     Objective:    BP 138/76 mmHg  Pulse 63  Temp(Src) 98.4 F (36.9 C) (Oral)  Resp 16  Ht 6\' 4"  (1.93 m)  Wt 277 lb 9.6 oz (125.919 kg)  BMI 33.80 kg/m2  Wt Readings from Last 3 Encounters:  06/13/15 277 lb 9.6 oz (125.919 kg)  03/09/15 272 lb (123.378 kg)  11/24/14 264 lb 6.4 oz (119.931 kg)       Physical Exam  Constitutional: He is oriented to person, place, and time. He appears well-developed and well-nourished. No distress.  HENT:  Head: Normocephalic and atraumatic.  Neck: Neck supple. No thyromegaly present.  Cardiovascular: Normal rate, regular rhythm and normal heart sounds.  Exam reveals no gallop and no friction rub.   No murmur heard. Pulmonary/Chest: Effort normal and breath sounds normal. He has no wheezes.  Abdominal: Soft. Bowel sounds are normal. He exhibits no distension. There is no tenderness. There is no rebound.  Musculoskeletal: Normal range of motion. He  exhibits no edema or tenderness.  Neurological: He is alert and oriented to person, place, and time. He has normal reflexes.  Skin: Skin is warm and dry. No rash noted. No erythema.  Psychiatric: He has a normal mood and affect. His behavior is normal. Thought content normal.   Diabetic Foot Exam - Simple   Simple Foot Form  Diabetic Foot exam was performed with the following findings:  Yes 06/13/2015  1:51 PM  Visual Inspection  No deformities, no ulcerations, no other skin breakdown  bilaterally:  Yes  Sensation Testing  Intact to touch and monofilament testing bilaterally:  Yes  Pulse Check  Posterior Tibialis and Dorsalis pulse intact bilaterally:  Yes  Comments      Results for orders placed or performed in visit on 06/13/15  POCT HgB A1C  Result Value Ref Range   Hemoglobin A1C 7.7       Assessment & Plan:   Problem List Items Addressed This Visit      Cardiovascular and Mediastinum   Hypertension    Controlled in the office today. Continue current regimen. DASH diet reviewed. Recheck 3 mos.       Relevant Medications   aspirin 81 MG tablet   Coronary artery disease involving native coronary artery without angina pectoris   Relevant Medications   aspirin 81 MG tablet     Endocrine   Diabetes mellitus without complication (HCC) - Primary    Add victoza injection once daily to help control sugars. Recommend 10 minutes exercise after each meal to aid in weight loss.  ACE for renal protection. Eye exam: due 11/2015. Foot exam UTD.  Recheck 3 mos.       Relevant Medications   aspirin 81 MG tablet   Liraglutide 18 MG/3ML SOPN   Insulin Pen Needle 31G X 5 MM MISC     Other   Hyperlipidemia    Controlled with Crestor. Heart healthy diet encouraged. Recheck 3 mos.       Relevant Medications   aspirin 81 MG tablet      Meds ordered this encounter  Medications  . aspirin 81 MG tablet    Sig: Take 81 mg by mouth daily.  . Liraglutide 18 MG/3ML SOPN    Sig: Inject 0.6mg  daily under the skin for 1 week, increase to 1.2mg  daily for 1 week, then increase to 1.8mg  once daily.    Dispense:  6 mL    Refill:  11    Order Specific Question:  Supervising Provider    Answer:  Arlis Porta [phone]  . Insulin Pen Needle 31G X 5 MM MISC    Sig: 1 each by Does not apply route daily.    Dispense:  100 each    Refill:  11    Order Specific Question:  Supervising Provider    Answer:  Arlis Porta [phone]      Follow up plan: Return  in about 3 months (around 09/13/2015), or if symptoms worsen or fail to improve, for diabetes. Marland Kitchen

## 2015-06-13 NOTE — Assessment & Plan Note (Signed)
Controlled with Crestor. Heart healthy diet encouraged. Recheck 3 mos.

## 2015-06-16 ENCOUNTER — Other Ambulatory Visit: Payer: Self-pay | Admitting: Family Medicine

## 2015-06-16 ENCOUNTER — Telehealth: Payer: Self-pay | Admitting: Family Medicine

## 2015-06-16 MED ORDER — DULAGLUTIDE 0.75 MG/0.5ML ~~LOC~~ SOAJ: 0.7500 mg | SUBCUTANEOUS | Status: DC

## 2015-06-16 NOTE — Telephone Encounter (Signed)
Called pt regarding his victoza prescription- LMTCB . Insurance would prefer Trulicity. Would he like to change to that medication? It is same type of medication- inject once weekly. If he has already picked up victoza and applied savings card, we will proceed with original plan. Thanks! AK

## 2015-06-16 NOTE — Telephone Encounter (Signed)
Done

## 2015-06-16 NOTE — Telephone Encounter (Signed)
Patient willing to try Trulicity. Please send to pharmacy.

## 2015-07-20 LAB — HM DIABETES EYE EXAM

## 2015-08-11 LAB — COLOGUARD: Cologuard: NEGATIVE

## 2015-09-19 ENCOUNTER — Ambulatory Visit (INDEPENDENT_AMBULATORY_CARE_PROVIDER_SITE_OTHER): Payer: Managed Care, Other (non HMO) | Admitting: Family Medicine

## 2015-09-19 VITALS — BP 132/74 | HR 63 | Temp 98.2°F | Resp 16 | Ht 76.0 in | Wt 276.0 lb

## 2015-09-19 DIAGNOSIS — E785 Hyperlipidemia, unspecified: Secondary | ICD-10-CM

## 2015-09-19 DIAGNOSIS — I10 Essential (primary) hypertension: Secondary | ICD-10-CM | POA: Diagnosis not present

## 2015-09-19 DIAGNOSIS — E119 Type 2 diabetes mellitus without complications: Secondary | ICD-10-CM | POA: Diagnosis not present

## 2015-09-19 LAB — LIPID PANEL
Cholesterol: 112 mg/dL — ABNORMAL LOW (ref 125–200)
HDL: 43 mg/dL (ref >=40)
LDL Cholesterol: 40 mg/dL (ref <130)
Total CHOL/HDL Ratio: 2.6 Ratio (ref <=5.0)
Triglycerides: 144 mg/dL (ref <150)
VLDL: 29 mg/dL (ref <30)

## 2015-09-19 LAB — POCT GLYCOSYLATED HEMOGLOBIN (HGB A1C): Hemoglobin A1C: 6.5

## 2015-09-19 LAB — COMPLETE METABOLIC PANEL WITH GFR
ALT: 21 U/L (ref 9–46)
AST: 16 U/L (ref 10–35)
Albumin: 4.1 g/dL (ref 3.6–5.1)
Alkaline Phosphatase: 39 U/L — ABNORMAL LOW (ref 40–115)
BUN: 14 mg/dL (ref 7–25)
CO2: 29 mmol/L (ref 20–31)
Calcium: 9.4 mg/dL (ref 8.6–10.3)
Chloride: 102 mmol/L (ref 98–110)
Creat: 1 mg/dL (ref 0.70–1.25)
GFR, Est African American: >89 mL/min (ref >=60)
GFR, Est Non African American: 79 mL/min (ref >=60)
Glucose, Bld: 94 mg/dL (ref 65–99)
Potassium: 3.9 mmol/L (ref 3.5–5.3)
Sodium: 141 mmol/L (ref 135–146)
Total Bilirubin: 0.5 mg/dL (ref 0.2–1.2)
Total Protein: 6.3 g/dL (ref 6.1–8.1)

## 2015-09-19 MED ORDER — GLIPIZIDE 10 MG PO TABS: 10.0000 mg | ORAL_TABLET | Freq: Every day | ORAL | 3 refills | Status: DC

## 2015-09-19 NOTE — Assessment & Plan Note (Addendum)
Controlled on recheck. Continue current regimen. CMP today. Recheck 3 mos.

## 2015-09-19 NOTE — Progress Notes (Signed)
Subjective:    Patient ID: Kevin Perry, male    DOB: 1950/12/05, 65 y.o.   MRN: [id number]  HPI: Kevin Perry is a 65 y.o. male presenting on 09/19/2015 for Diabetes (highest BS 170 and lowest 106 )   HPI Pt presents for diabetes follow-up. Last A1c was 7.7% and A1c today is 6.5%.  No side effects. Has cut back diet sodas. Exercise- still walking every morning. Stays active in his job.  Avg sugars 130-140. Highest 170. Lowest 106.  Eye exam UTD.   Past Medical History:  Diagnosis Date  . Anxiety   . Arthritis   . Diabetes mellitus without complication ([place])   . Hypertension   . Myocardial infarction [facility]) 2008    Current Outpatient Prescriptions on File Prior to Visit  Medication Sig  . amLODipine (NORVASC) 10 MG tablet Take 1 tablet (10 mg total) by mouth daily.  Marland Kitchen aspirin 81 MG tablet Take 81 mg by mouth daily.  . Dulaglutide (TRULICITY) [id number] 0000000 SOPN Inject 0.75 mg into the skin once a week.  . escitalopram (LEXAPRO) 20 MG tablet Take 1 tablet (20 mg total) by mouth at bedtime.  . furosemide (LASIX) 40 MG tablet Take 1 tablet (40 mg total) by mouth every morning.  . Insulin Pen Needle 31G X 5 MM MISC 1 each by Does not apply route daily.  Marland Kitchen lisinopril (PRINIVIL,ZESTRIL) 20 MG tablet Take 1 tablet (20 mg total) by mouth every morning.  . loratadine (CLARITIN) 10 MG tablet Take 10 mg by mouth every morning.  . metoprolol succinate (TOPROL-XL) 50 MG 24 hr tablet Take 1 tablet (50 mg total) by mouth daily. Take with or immediately following a meal.  . rosuvastatin (CRESTOR) 10 MG tablet Take 1 tablet (10 mg total) by mouth every morning.   No current facility-administered medications on file prior to visit.     Review of Systems  Constitutional: Negative for chills and fever.  HENT: Negative.   Respiratory: Negative for chest tightness, shortness of breath and wheezing.   Cardiovascular: Negative for chest pain, palpitations and leg swelling.  Gastrointestinal:  Negative for abdominal pain, nausea and vomiting.  Endocrine: Negative.   Genitourinary: Negative for discharge, dysuria, penile pain, testicular pain and urgency.  Musculoskeletal: Negative for arthralgias, back pain and joint swelling.  Skin: Negative.   Neurological: Negative for dizziness, weakness, numbness and headaches.  Psychiatric/Behavioral: Negative for dysphoric mood and sleep disturbance.   Per HPI unless specifically indicated above     Objective:    BP 132/74 (BP Location: Left Arm)   Pulse 63   Temp 98.2 F (36.8 C) (Oral)   Resp 16   Ht 6\' 4"  (1.93 m)   Wt 276 lb (125.2 kg)   BMI 33.60 kg/m   Wt Readings from Last 3 Encounters:  09/19/15 276 lb (125.2 kg)  06/13/15 277 lb 9.6 oz (125.9 kg)  03/09/15 272 lb (123.4 kg)    Physical Exam  Constitutional: He is oriented to person, place, and time. He appears well-developed and well-nourished. No distress.  HENT:  Head: Normocephalic and atraumatic.  Neck: Neck supple. No thyromegaly present.  Cardiovascular: Normal rate, regular rhythm and normal heart sounds.  Exam reveals no gallop and no friction rub.   No murmur heard. Pulmonary/Chest: Effort normal and breath sounds normal. He has no wheezes.  Abdominal: Soft. Bowel sounds are normal. He exhibits no distension. There is no tenderness. There is no rebound.  Musculoskeletal: Normal range of motion.  He exhibits no edema or tenderness.  Neurological: He is alert and oriented to person, place, and time. He has normal reflexes.  Skin: Skin is warm and dry. No rash noted. No erythema.  Psychiatric: He has a normal mood and affect. His behavior is normal. Thought content normal.   Results for orders placed or performed in visit on 09/19/15  POCT HgB A1C  Result Value Ref Range   Hemoglobin A1C 6.5   HM DIABETES EYE EXAM  Result Value Ref Range   HM Diabetic Eye Exam No Retinopathy No Retinopathy      Assessment & Plan:   Problem List Items Addressed This  Visit      Cardiovascular and Mediastinum   Hypertension    Controlled on recheck. Continue current regimen. CMP today. Recheck 3 mos.         Endocrine   Diabetes mellitus without complication ([place]) - Primary    Great response to trulicity. A1c down to 6.5%.  Will reduce glipizide to reduce risk of hypoglycemia. Eye exam UTD. Foot exam done. UA micro albumin is negative. Remains on ACE. Recheck 3 mos.       Relevant Medications   glipiZIDE (GLUCOTROL) 10 MG tablet   Other Relevant Orders   POCT HgB A1C (Completed)   COMPLETE METABOLIC PANEL WITH GFR     Other   Hyperlipidemia    Remains on high intensity statin 2/2 ASCVD. Check lipid panel today. Recheck 3 mos.       Relevant Orders   Lipid Profile    Other Visit Diagnoses   None.     Meds ordered this encounter  Medications  . glipiZIDE (GLUCOTROL) 10 MG tablet    Sig: Take 1 tablet (10 mg total) by mouth daily before breakfast.    Dispense:  90 tablet    Refill:  3    Order Specific Question:   Supervising Provider    Answer:   Arlis Porta [id number]      Follow up plan: Return in about 3 months (around 12/20/2015), or if symptoms worsen or fail to improve.

## 2015-09-19 NOTE — Patient Instructions (Addendum)
Keep up the good work with your diabetes! You are doing well!  Let's try reducing the glipizide to 10mg  once daily.   Your goal blood pressure is 140/90. Work on low salt/sodium diet - goal <1.5gm (1,500mg ) per day. Eat a diet high in fruits/vegetables and whole grains.  Look into mediterranean and DASH diet. Goal activity is 156min/wk of moderate intensity exercise.  This can be split into 30 minute chunks.  If you are not at this level, you can start with smaller 10-15 min increments and slowly build up activity. Look at [email or web address] for more resources  Please seek immediate medical attention at ER or Urgent Care if you develop: Chest pain, pressure or tightness. Shortness of breath accompanied by nausea or diaphoresis Visual changes Numbness or tingling on one side of the body Facial droop Altered mental status Or any concerning symptoms.

## 2015-09-19 NOTE — Assessment & Plan Note (Signed)
Great response to trulicity. A1c down to 6.5%.  Will reduce glipizide to reduce risk of hypoglycemia. Eye exam UTD. Foot exam done. UA micro albumin is negative. Remains on ACE. Recheck 3 mos.

## 2015-09-19 NOTE — Assessment & Plan Note (Signed)
Remains on high intensity statin 2/2 ASCVD. Check lipid panel today. Recheck 3 mos.

## 2016-01-16 ENCOUNTER — Encounter: Payer: Self-pay | Admitting: Family Medicine

## 2016-01-16 ENCOUNTER — Ambulatory Visit (INDEPENDENT_AMBULATORY_CARE_PROVIDER_SITE_OTHER): Payer: Managed Care, Other (non HMO) | Admitting: Family Medicine

## 2016-01-16 VITALS — BP 149/80 | HR 64 | Temp 98.1°F | Resp 16 | Ht 76.0 in | Wt 277.0 lb

## 2016-01-16 DIAGNOSIS — J019 Acute sinusitis, unspecified: Secondary | ICD-10-CM | POA: Diagnosis not present

## 2016-01-16 MED ORDER — AMOXICILLIN-POT CLAVULANATE 875-125 MG PO TABS: 1.0000 | ORAL_TABLET | Freq: Two times a day (BID) | ORAL | 0 refills | Status: DC

## 2016-01-16 MED ORDER — FLUTICASONE PROPIONATE 50 MCG/ACT NA SUSP: 2.0000 | Freq: Every day | NASAL | 3 refills | Status: DC

## 2016-01-16 NOTE — Progress Notes (Signed)
Subjective:    Patient ID: Kevin Perry, male    DOB: September 04, 1950, 65 y.o.   MRN: [id number]  GEN WOLFINGER is a 65 y.o. male presenting on 01/16/2016 for Cough (nasal congestion few chills not fever onset week)  Patient presents for a same day appointment.  HPI   URI / SINUSITIS: - Reports symptoms started about 1 week ago with sore scratchy throat with nasal congestion and sinus drainage, then worsening to chest congestion and occasionally productive cough, does have persistent sinus congestion and thicker rhinorrhea. 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 Smoking status: Former Smoker    Quit date: 08/16/2004  . Smokeless tobacco: Never Used     Comment: quit 20 plus years  . Alcohol use No     Comment: SOCIAL    Review of Systems Per HPI unless specifically indicated above     Objective:    BP (!) 149/80   Pulse 64   Temp 98.1 F (36.7 C) (Oral)   Resp 16   Ht 6\' 4"  (1.93 m)   Wt 277 lb (125.6 kg)   SpO2 97%   BMI 33.72 kg/m   Wt Readings from Last 3 Encounters:  01/16/16 277 lb (125.6 kg)  09/19/15 276 lb (125.2 kg)  06/13/15 277 lb 9.6 oz (125.9 kg)    Physical Exam  Constitutional: He appears well-developed and well-nourished. No distress.  Well-appearing, comfortable, cooperative  HENT:  Head: Normocephalic and atraumatic.  Mouth/Throat: Oropharynx is clear and moist.  Mild tender Left maxillary and frontal sinus. Nares mostly patent with some  mild turbinate edema and deeper congestion without purulence. Bilateral TMs clear without erythema, effusion or bulging. Oropharynx clear without erythema, exudates, edema or asymmetry.  Eyes: Conjunctivae are normal. Right eye exhibits no discharge. Left eye exhibits no discharge.  Neck: Normal range of motion. Neck supple. No thyromegaly present.  Cardiovascular: Normal rate, regular rhythm, normal heart sounds and intact distal pulses.   No murmur heard. Pulmonary/Chest: Effort normal. No respiratory distress. He has no rales.  Good air movement. Some mild transmitted upper airway breath sounds, clear with cough. Occasional coughing during visit.  Musculoskeletal: He exhibits no edema.  Lymphadenopathy:    He has no cervical adenopathy.  Neurological: He is alert.  Skin: Skin is warm and dry. He is not diaphoretic.  Psychiatric: His behavior is normal.  Nursing note and vitals reviewed.    I have personally reviewed the following lab results from 09/19/15.  Results for orders placed or performed in visit on 09/19/15  COMPLETE METABOLIC PANEL WITH GFR  Result Value Ref Range   Sodium 141 135 - 146 mmol/L   Potassium 3.9 3.5 - 5.3 mmol/L   Chloride 102 98 - 110 mmol/L   CO2 29 20 - 31 mmol/L   Glucose, Bld 94 65 - 99 mg/dL   BUN 14 7 - 25 mg/dL   Creat 1.00 0.70 - 1.25 mg/dL   Total Bilirubin 0.5 0.2 - 1.2 mg/dL   Alkaline Phosphatase 39 (L)  40 - 115 U/L   AST 16 10 - 35 U/L   ALT 21 9 - 46 U/L   Total Protein 6.3 6.1 - 8.1 g/dL   Albumin 4.1 3.6 - 5.1 g/dL   Calcium 9.4 8.6 - 10.3 mg/dL   GFR, Est African American >89 >=60 mL/min   GFR, Est Non African American 79 >=60 mL/min  Lipid Profile  Result Value Ref Range   Cholesterol 112 (L) 125 - 200 mg/dL   Triglycerides 144 <150 mg/dL   HDL 43 >=40 mg/dL   Total CHOL/HDL Ratio 2.6 <=5.0 Ratio   VLDL 29 <30 mg/dL   LDL Cholesterol 40 <130 mg/dL  POCT HgB A1C  Result Value Ref Range   Hemoglobin A1C 6.5   HM DIABETES EYE  EXAM  Result Value Ref Range   HM Diabetic Eye Exam No Retinopathy No Retinopathy      Assessment & Plan:   Problem List Items Addressed This Visit    None    Visit Diagnoses    Acute rhinosinusitis    -  Primary  Consistent with acute rhinosinusitis, likely initially viral URI vs allergic rhinitis component with worsening concern for bacterial infection in setting of multiple sick contacts. Plan: 1. Start Augmentin 875-125mg  PO BID x 10 days 2. Resume Loratadine (Claritin) 10mg  daily and Start Flonase 2 sprays in each nostril daily for next 4-6 weeks, then may stop and use seasonally or as needed 3. Finish mucinex now BID 3-5 days then stop 4. Supportive care with nasal saline OTC, hydration 5. Return criteria reviewed    Relevant Medications   amoxicillin-clavulanate (AUGMENTIN) 875-125 MG tablet   fluticasone (FLONASE) 50 MCG/ACT nasal spray      Meds ordered this encounter  Medications  .   . amoxicillin-clavulanate (AUGMENTIN) 875-125 MG tablet    Sig: Take 1 tablet by mouth 2 (two) times daily.    Dispense:  20 tablet    Refill:  0  . fluticasone (FLONASE) 50 MCG/ACT nasal spray    Sig: Place 2 sprays into both nostrils daily. Use for 4-6 weeks then stop and use seasonally or as needed.    Dispense:  16 g    Refill:  3      Follow up plan: Return in about 2 weeks (around 01/30/2016), or if symptoms worsen or fail to improve, for sinusitis.  Future follow-up anytime in next 1-2 months for DM follow-up A1c, also advised to bring in copy of Flu Shot vaccination record from [facility] 10/2015  [name], [facility] 01/16/2016, 2:29 PM

## 2016-01-16 NOTE — Patient Instructions (Signed)
Thank you for coming in to clinic today.  1. It sounds like you have a Sinusitis (Bacterial Infection) - this most likely started as an Upper Respiratory Virus that has settled into an infection. Allergies can also cause this. - Start Augmentin 1 pill twice daily (breakfast and dinner, with food and plenty of water) for 10 days, complete entire course, do not stop early even if feeling better - Resume Loratadine (Claritin) 10mg  daily and Start Flonase 2 sprays in each nostril daily for next 4-6 weeks, then you may stop and use seasonally or as needed - Recommend can try use Nasal Saline spray multiple times a day to help flush out congestion and clear sinuses - Improve hydration by drinking plenty of clear fluids (water, gatorade) to reduce secretions and thin congestion - Congestion draining down throat can cause irritation. May try warm herbal tea with honey, cough drops - Can take Tylenol PRN fevers chills - Continue Mucinex BID for 3-5 days then stop  Please schedule a follow-up appointment with Dr. Parks Ranger in 2 weeks if worsening sinusitis  If you have any other questions or concerns, please feel free to call the clinic or send a message through [facility]. You may also schedule an earlier appointment if necessary.  [name], DO [place]

## 2016-02-01 ENCOUNTER — Telehealth: Payer: Self-pay

## 2016-02-01 DIAGNOSIS — E119 Type 2 diabetes mellitus without complications: Secondary | ICD-10-CM

## 2016-02-01 MED ORDER — ONGLYZA 5 MG PO TABS: 5.0000 mg | ORAL_TABLET | Freq: Every day | ORAL | 0 refills | Status: DC

## 2016-02-01 NOTE — Telephone Encounter (Signed)
Refilled Onglyza 5mg  daily 90 day supply 0 refills to [facility]. Reviewed notes however [name] previously DC'd Onglyza in 06/2015, and started Victoza.  Patient should follow-up to review DM medications / management in future.  [name], DO [facility] 02/01/2016, 2:06 PM

## 2016-02-01 NOTE — Telephone Encounter (Signed)
Patient is requesting a 90 day supply of Onglyza 5mg  for be sent to [address].

## 2016-02-14 ENCOUNTER — Ambulatory Visit: Payer: Managed Care, Other (non HMO) | Admitting: Podiatry

## 2016-02-14 ENCOUNTER — Telehealth: Payer: Self-pay | Admitting: Family Medicine

## 2016-02-14 NOTE — Telephone Encounter (Signed)
Last seen by prior PCP [name] in 09/2015 for Diabetes management, I have seen patient for acute visit rhinosinusitis, however he is scheduled to see me 03/05/16 for Diabetes follow-up.  In meantime, I have recently refilled his Onglyza as requested 1-2 weeks ago, however we have received a Prior Authorization request, stating that he would need to try preferred option Januvia or Tradjenta first. From chart review he had reported to be on Januvia in the past.  However, I am confused as to his current regimen, as it appears to be Trulicity 0.75mg  weekly, Glipizide 10mg  (has history of Metformin intolerance in past), he was on Onglyza previously back 06/2015 but then this seemed to be discontinued when he started Trulicity.  Attempted to call him to discuss these DM medications first before switching from [place] to possibly Tradjenta. He may not need this medication at all, if doing well on Trulicity and Glipizide, and can even increase Trulicity if tolerating well. Last A1c 6.5 (09/2015) he is due for next A1c, prior A1cs in 7s  Left voicemail for patient to call back and discuss meds. Will stay tuned for now.  [name], [facility] 02/14/2016, 4:58 PM

## 2016-02-15 NOTE — Telephone Encounter (Signed)
Reached patient today 02/15/16 around 1230, patient clarified several things.  1. He has never been on Januvia.  2. He is currently off of Onglyza 5mg  daily for past 1 week, since 02/2016. By next apt 03/05/16 he will only be off Onglyza for 1 month.  3. Recent CBGs at home around 130.  His last A1c was 6.5, and suspect that he will remain well controlled on Trulicty 0.75mg  weekly, Glipizide 10mg  daily, he agrees to current tentative plan to follow-up for DM in next 3 weeks, check A1c, if remains controlled < 7.0, then remain off of Onglyza, and consider re-check in 3 months. If still controlled can continue same meds, otherwise if elevated A1c or other concerns, can double dose of Trulicity as next option instead of adding back DPP4.  Kevin Perry, [facility] 02/15/2016, 12:40 PM

## 2016-02-28 ENCOUNTER — Ambulatory Visit (INDEPENDENT_AMBULATORY_CARE_PROVIDER_SITE_OTHER): Payer: Managed Care, Other (non HMO)

## 2016-02-28 ENCOUNTER — Ambulatory Visit (INDEPENDENT_AMBULATORY_CARE_PROVIDER_SITE_OTHER): Payer: Managed Care, Other (non HMO) | Admitting: Podiatry

## 2016-02-28 ENCOUNTER — Encounter: Payer: Self-pay | Admitting: Podiatry

## 2016-02-28 VITALS — BP 131/75 | HR 73 | Resp 16

## 2016-02-28 DIAGNOSIS — M2041 Other hammer toe(s) (acquired), right foot: Secondary | ICD-10-CM | POA: Diagnosis not present

## 2016-02-28 DIAGNOSIS — M21621 Bunionette of right foot: Secondary | ICD-10-CM

## 2016-02-28 DIAGNOSIS — M2011 Hallux valgus (acquired), right foot: Secondary | ICD-10-CM | POA: Diagnosis not present

## 2016-02-28 NOTE — Progress Notes (Signed)
Done presents today after having not seen him from several years. He is complaining of pain to the right foot as he states that his bunion has worsened his second toe since. He is complaining of his third toe being painful as well and his fifth knuckle being painful. He states that this is affecting his ability to work and perform his daily activities.  Objective: I have reviewed his past medical history medications allergy surgery social history review of systems. Vital signs are stable he is alert and oriented 3 pulses are strongly palpable. Neurologic sensorium is intact deep tendon reflexes are brisk and equal bilateral. Muscle strength symmetrical bilateral and full. Orthopedic evaluation of the straight hallux abductovalgus deformity right with hallux limitus. It is tender on range of motion with a large hypertrophic medial condyle to the head of the first metatarsal. He also has a severely cocked up hammertoe deformity with dislocation of the second toe at the level of the metatarsophalangeal joint right foot. Hammertoe deformity third digit right foot with tailor's bunion deformity. Radiographs confirm hallux valgus deformity with severe osteoarthritic changes elongated second metatarsal and rigid hammertoe deformity tailor's bunion deformity and hammertoe deformity third. No fractures are identified. Cutaneous evaluation does not demonstrate any type of lesions or wounds.  Assessment: Hallux abductovalgus deformity with hallux limitus first metatarsophalangeal joint of the right foot. Plantar flexed elongated second metatarsal with capsulitis second metatarsophalangeal joint right foot. Hammertoe deformity second and third digits of the right foot. Tailor's bunion deformity fifth right.  Plan: We discussed the etiology pathology conservative versus surgical therapies. I expressed to him today that this is a surgical correction and he understands this. We went over a consent form today in great detail  consisting of an Futures trader and a possible Keller arthroplasty with a single silicone implant to be determined in the operating room. A second metatarsal osteotomy shortening in nature to try to save the second toe of his right foot. I expressed to him that the possible loss of this toe due to the severe contracture as we try to straighten it. He understands this is amenable to it. We also discussed hammertoe deformity with possible screw or pin third right and a fifth metatarsal osteotomy right. I answered all questions regarding these procedures best of my ability in layman's terms we did discuss the possible postop complications which may include but are not limited to postop pain bleeding swelling infection recurrence need for further surgery loss of digit loss of limb also life. We did discuss that this will be done an outpatient surgical setting [facility] and we discussed anesthesia and a sciatic block. I will follow-up with him the time of surgery. We dispensed a cam walker.

## 2016-02-28 NOTE — Patient Instructions (Signed)
Pre-Operative Instructions  Congratulations, you have decided to take an important step to improving your quality of life.  You can be assured that the doctors of [facility] will be with you every step of the way.  1. Plan to be at the surgery center/hospital at least 1 (one) hour prior to your scheduled time unless otherwise directed by the surgical center/hospital staff.  You must have a responsible adult accompany you, remain during the surgery and drive you home.  Make sure you have directions to the surgical center/hospital and know how to get there on time. 2. For hospital based surgery you will need to obtain a history and physical form from your family physician within 1 month prior to the date of surgery- we will give you a form for you primary physician.  3. We make every effort to accommodate the date you request for surgery.  There are however, times where surgery dates or times have to be moved.  We will contact you as soon as possible if a change in schedule is required.   4. No Aspirin/Ibuprofen for one week before surgery.  If you are on aspirin, any non-steroidal anti-inflammatory medications (Mobic, Aleve, Ibuprofen) you should stop taking it 7 days prior to your surgery.  You make take Tylenol  For pain prior to surgery.  5. Medications- If you are taking daily heart and blood pressure medications, seizure, reflux, allergy, asthma, anxiety, pain or diabetes medications, make sure the surgery center/hospital is aware before the day of surgery so they may notify you which medications to take or avoid the day of surgery. 6. No food or drink after midnight the night before surgery unless directed otherwise by surgical center/hospital staff. 7. No alcoholic beverages 24 hours prior to surgery.  No smoking 24 hours prior to or 24 hours after surgery. 8. Wear loose pants or shorts- loose enough to fit over bandages, boots, and casts. 9. No slip on shoes, sneakers are best. 10. Bring  your boot with you to the surgery center/hospital.  Also bring crutches or a walker if your physician has prescribed it for you.  If you do not have this equipment, it will be provided for you after surgery. 11. If you have not been contracted by the surgery center/hospital by the day before your surgery, call to confirm the date and time of your surgery. 12. Leave-time from work may vary depending on the type of surgery you have.  Appropriate arrangements should be made prior to surgery with your employer. 13. Prescriptions will be provided immediately following surgery by your doctor.  Have these filled as soon as possible after surgery and take the medication as directed. 14. Remove nail polish on the operative foot. 15. Wash the night before surgery.  The night before surgery wash the foot and leg well with the antibacterial soap provided and water paying special attention to beneath the toenails and in between the toes.  Rinse thoroughly with water and dry well with a towel.  Perform this wash unless told not to do so by your physician.  Enclosed: 1 Ice pack (please put in freezer the night before surgery)   1 Hibiclens skin cleaner   Pre-op Instructions  If you have any questions regarding the instructions, do not hesitate to call our office.  [place]: [address] [place], [place] [address] [phone]  [place]: [address] [place], [place] [address] [phone]  : [address]  , [place] [address] [phone]   Dr.   Norman Regal DPM, Dr. Matthew Wagoner DPM, Dr. M. Todd Keionna Kinnaird DPM, Dr. Titorya Stover DPM 

## 2016-03-01 ENCOUNTER — Encounter: Payer: Self-pay | Admitting: Family Medicine

## 2016-03-01 ENCOUNTER — Ambulatory Visit (INDEPENDENT_AMBULATORY_CARE_PROVIDER_SITE_OTHER): Payer: Managed Care, Other (non HMO) | Admitting: Family Medicine

## 2016-03-01 VITALS — BP 165/84 | HR 83 | Temp 98.8°F | Resp 16 | Ht 76.0 in | Wt 273.0 lb

## 2016-03-01 DIAGNOSIS — R509 Fever, unspecified: Secondary | ICD-10-CM

## 2016-03-01 DIAGNOSIS — J101 Influenza due to other identified influenza virus with other respiratory manifestations: Secondary | ICD-10-CM

## 2016-03-01 LAB — POCT INFLUENZA A/B
Influenza A, POC: POSITIVE — AB
Influenza B, POC: NEGATIVE

## 2016-03-01 MED ORDER — OSELTAMIVIR PHOSPHATE 75 MG PO CAPS: 75.0000 mg | ORAL_CAPSULE | Freq: Two times a day (BID) | ORAL | 0 refills | Status: DC

## 2016-03-01 NOTE — Patient Instructions (Signed)
Thank you for coming in to clinic today.  1. Our test does appear to confirm that you have Influenza A infection. - Start tamiflu medicine one capsule 75mg  twice a day for 5 days. Close contacts at home can take 1 capsule daily for 5 days for preventative dose - Try to improve hydration, take Ibuprofen / Tylenol as needed if you can tolerate these for symptoms, rest and good hand washing  If significant worsening over weekend with poor fluid intake, worsening body aches, weakness, or other more concerning symptoms difficulty breathing you can seek more emergent treatment at ED. Also if improved flu symptoms and then worsening days to week later with concerns for bronchitis, productive cough fever chills again we may need to check for possible pneumonia that can occur after the flu  Please schedule a follow-up appointment with Dr. Parks Ranger in 1-2 weeks as needed if worsening from Flu   If you have any other questions or concerns, please feel free to call the clinic or send a message through [facility]. You may also schedule an earlier appointment if necessary.  [name], DO [place]

## 2016-03-01 NOTE — Progress Notes (Signed)
Subjective:    Patient ID: Kevin Perry, male    DOB: Jun 07, 1950, 66 y.o.   MRN: [id number]  Kevin Perry is a 66 y.o. male presenting on 03/01/2016 for Cough (onset 2 days chills HA not checked for rever runny nose ear pain)  Patient presents for a same day appointment.  HPI   FLU: - Reports symptoms started about 2 nights ago with some congestion and postnasal drip with "tickle in back of throat", now progressed to worsening today with generalized body aches and feeling chills, recorded fever last night up to 100*, associated with headache and non productive cough, some mild improvement in congestion. 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 Smoking status: Former Smoker    Quit date: 08/16/2004  . Smokeless tobacco: Never Used     Comment: quit 20 plus years  . Alcohol use No     Comment: SOCIAL    Review of Systems Per HPI unless specifically indicated above     Objective:    BP (!) 165/84 (BP Location: Left Arm, Patient Position: Sitting, Cuff Size: Normal)   Pulse 83   Temp 98.8 F (37.1 C) (Oral)   Resp 16   Ht 6\' 4"  (1.93 m)   Wt 273 lb (123.8 kg)   SpO2 96%   BMI 33.23 kg/m   Wt Readings from Last 3 Encounters:  03/01/16 273 lb (123.8 kg)  01/16/16 277 lb (125.6 kg)  09/19/15 276 lb (125.2 kg)    Physical Exam  Constitutional: He is oriented to person, place, and time. He appears well-developed and well-nourished. No distress.  Well-appearing, comfortable, cooperative  HENT:  Head:  Normocephalic and atraumatic.  Mouth/Throat: Oropharynx is clear and moist.  Non tender maxillary frontal sinus. Nares mostly patent with some mild congestion without purulence. Bilateral TMs with mild R>L effusion without without erythema or bulging. Oropharynx clear without erythema, exudates, edema or asymmetry.  Eyes: Conjunctivae are normal. Right eye exhibits no discharge. Left eye exhibits no discharge.  Neck: Normal range of motion. Neck supple. No thyromegaly present.  Cardiovascular: Normal rate, regular rhythm, normal heart sounds and intact distal pulses.   No murmur heard. Pulmonary/Chest: Effort normal. No respiratory distress. He has no wheezes. He has no rales.  Good air movement.  Musculoskeletal: He exhibits no edema.  Lymphadenopathy:    He has no cervical adenopathy.  Neurological: He is alert and oriented to person, place, and time.  Skin: Skin is warm and dry. No rash noted. He is not diaphoretic. No erythema.  Psychiatric: His behavior is normal.  Nursing note and vitals reviewed.    I have personally reviewed the following lab results from 09/19/15.  Results for orders placed or performed in visit on 03/01/16  POCT Influenza A/B  Result Value Ref Range   Influenza A, POC Positive (A) Negative   Influenza B, POC Negative Negative      Assessment & Plan:   Problem List Items Addressed This Visit    None    Visit Diagnoses  Influenza A    -  Primary Clinically consistent with influenza despite s/p influenza vaccine, likely from close contact with hospital patients many sick contacts. Confirmed on POC Rapid Flu test today in office with faint positive line for influenza A. - Prior treatment with Tamiflu years ago did well  Plan: 1. Start Tamiflu 75mg  capsule BID for 5 days 2. Continue supportive care at home, improve hydration 3. Given 2nd rx Tamiflu for wife for preventative dose, may take either 75mg  daily for 5 days or full dose if symptoms 4. Return  criteria given when to go to ED if significant worsening, when to follow-up if concern for post-influenza PNA or other sequela / complication of flu     Relevant Medications   oseltamivir (TAMIFLU) 75 MG capsule   Fever and chills       Relevant Orders   POCT Influenza A/B (Completed)      Follow up plan: Return in about 2 weeks (around 03/15/2016), or if symptoms worsen or fail to improve, for Flu.  [name], [facility] 03/01/2016, 9:36 AM

## 2016-03-04 ENCOUNTER — Telehealth: Payer: Self-pay

## 2016-03-04 ENCOUNTER — Ambulatory Visit
Admission: RE | Admit: 2016-03-04 | Discharge: 2016-03-04 | Disposition: A | Discharge status: Home / Self Care | Payer: Managed Care, Other (non HMO) | Source: Ambulatory Visit | Attending: Family Medicine | Admitting: Family Medicine

## 2016-03-04 DIAGNOSIS — J101 Influenza due to other identified influenza virus with other respiratory manifestations: Secondary | ICD-10-CM | POA: Insufficient documentation

## 2016-03-04 DIAGNOSIS — R05 Cough: Secondary | ICD-10-CM | POA: Insufficient documentation

## 2016-03-04 DIAGNOSIS — J90 Pleural effusion, not elsewhere classified: Secondary | ICD-10-CM | POA: Diagnosis not present

## 2016-03-04 DIAGNOSIS — R0789 Other chest pain: Secondary | ICD-10-CM | POA: Insufficient documentation

## 2016-03-04 NOTE — Telephone Encounter (Signed)
Patient was dx with Flu A on Friday.  He is complaining of chest hurting when he coughs.  Patient would like to come in today for chest x ray.  He also has a follow up tomorrow for DM, just FYI.

## 2016-03-04 NOTE — Telephone Encounter (Signed)
Order for CXR placed today 03/04/16. Please notify patient he may come by anytime today for Chest X-ray, and will notify of results later if acute concern, otherwise follow-up results tomorrow at scheduled DM follow-up.  [name], [facility] 03/04/2016, 9:08 AM

## 2016-03-05 ENCOUNTER — Ambulatory Visit (INDEPENDENT_AMBULATORY_CARE_PROVIDER_SITE_OTHER): Payer: Managed Care, Other (non HMO) | Admitting: Family Medicine

## 2016-03-05 ENCOUNTER — Encounter: Payer: Self-pay | Admitting: Family Medicine

## 2016-03-05 VITALS — BP 138/78 | HR 57 | Temp 97.8°F | Resp 16 | Ht 76.0 in | Wt 272.0 lb

## 2016-03-05 DIAGNOSIS — I2510 Atherosclerotic heart disease of native coronary artery without angina pectoris: Secondary | ICD-10-CM

## 2016-03-05 DIAGNOSIS — E785 Hyperlipidemia, unspecified: Secondary | ICD-10-CM

## 2016-03-05 DIAGNOSIS — E1169 Type 2 diabetes mellitus with other specified complication: Secondary | ICD-10-CM

## 2016-03-05 DIAGNOSIS — E1165 Type 2 diabetes mellitus with hyperglycemia: Secondary | ICD-10-CM | POA: Diagnosis not present

## 2016-03-05 DIAGNOSIS — IMO0001 Reserved for inherently not codable concepts without codable children: Secondary | ICD-10-CM

## 2016-03-05 DIAGNOSIS — I10 Essential (primary) hypertension: Secondary | ICD-10-CM | POA: Diagnosis not present

## 2016-03-05 DIAGNOSIS — R2243 Localized swelling, mass and lump, lower limb, bilateral: Secondary | ICD-10-CM

## 2016-03-05 LAB — POCT GLYCOSYLATED HEMOGLOBIN (HGB A1C): Hemoglobin A1C: 8.7

## 2016-03-05 MED ORDER — METOPROLOL SUCCINATE ER 50 MG PO TB24: 50.0000 mg | ORAL_TABLET | Freq: Every day | ORAL | 3 refills | Status: DC

## 2016-03-05 MED ORDER — AMLODIPINE BESYLATE 10 MG PO TABS: 10.0000 mg | ORAL_TABLET | Freq: Every day | ORAL | 3 refills | Status: DC

## 2016-03-05 MED ORDER — ROSUVASTATIN CALCIUM 10 MG PO TABS: 10.0000 mg | ORAL_TABLET | ORAL | 3 refills | Status: DC

## 2016-03-05 MED ORDER — LISINOPRIL 20 MG PO TABS: 20.0000 mg | ORAL_TABLET | ORAL | 3 refills | Status: DC

## 2016-03-05 MED ORDER — METFORMIN HCL ER 500 MG PO TB24: 500.0000 mg | ORAL_TABLET | Freq: Every day | ORAL | 5 refills | Status: DC

## 2016-03-05 MED ORDER — FUROSEMIDE 40 MG PO TABS: 40.0000 mg | ORAL_TABLET | Freq: Every day | ORAL | 3 refills | Status: DC | PRN

## 2016-03-05 NOTE — Patient Instructions (Signed)
Thank you for coming in to clinic today.  1. Diabetes - A1c today was increased, 8.7, last time 6.5 - Start Metformin XR 500mg  daily, see how it is tolerated, if upset stomach diarrhea after several days, then notify our office and we can stop that one and start increased dose Trulicity 1.5 (instead of 0.75) and will send new rx at that time - Continue Glipizide - Remain off Onglyza for now - Check blood sugar daily for about 1-2 weeks, then several times a week for several weeks and write down readings  The 5 Minute Rule of Exercise - Promise yourself to at least do 5 minutes of exercise (make sure you time it), and if at the end of 5 minutes (this is the hardest part of the work-out), if you still feel like you want stop (or not motivated to continue) then allow yourself to stop. Otherwise, more often than not you will feel encouraged that you can continue for a little while longer or even more!  Diet Recommendations for Diabetes   Limit Starchy (carb) foods include: Bread, rice, pasta, potatoes, corn, crackers, bagels, muffins, all baked goods.   Continue to eat Protein foods include: Meat, fish, poultry, eggs, dairy foods, and beans such as pinto and kidney beans (beans also provide carbohydrate).   1. Eat at least 3 meals and 1-2 snacks per day. Never go more than 4-5 hours while awake without eating.   2. Limit starchy foods to TWO per meal and ONE per snack. ONE portion of a starchy  food is equal to the following:   - ONE slice of bread (or its equivalent, such as half of a hamburger bun).   - 1/2 cup of a "scoopable" starchy food such as potatoes or rice.   - 1 OUNCE (28 grams) of starchy snacks (crackers or pretzels, look on label).   - 15 grams of carbohydrate as shown on food label.   3. Both lunch and dinner should include a protein food, a carb food, and vegetables.   - Obtain twice as many veg's as protein or carbohydrate foods for both lunch and dinner.   - Try to keep  frozen veg's on hand for a quick vegetable serving.     - Fresh or frozen veg's are best.   4. Breakfast should always include protein.    ------------ 2. Blood pressure - Initally elevated, improved on re-check, I'm concerned that BP may not optimally controlled, however this can be elevated due to recent illness - Check BP at work most days for next few weeks, and keep record of this - For now, Keep taking Lasix 40mg  daily, if swelling gets dramatically worse you can increase to twice a day for 2-3 days then return to once daily - Ideally in the future, if you don't have any Heart Failure, then this can be only as needed for swelling - Alternatively there is a different BP med that is better at controlling BP and also helps control swelling, Hydrochlorothiazide  3. Follow-up with Dr Ubaldo Glassing, given the recent chest x-ray with some fluid / vascular congestion, please discuss with him about repeat ECHOcardiogram (heart ultrasound) to determine heart function, may have some component of CHF or congestive heart failure years after your heart attack  4. For coughing / after flu - Try generic Mucinex (guafenesein only without DM, no decongestant) - May use nose spray to help reduce congestion - Stay well hydrated - Let this run it's course, if you were  worsening with fevers or productive thicker cough, notify office and we can send in antibiotic   Please schedule a follow-up appointment with Dr. Parks Ranger in 3 months for Diabetes A1c, HTN - ** CALL us in 6 weeks with report if doing well or return to office in 6 weeks if not improved **  If you have any other questions or concerns, please feel free to call the clinic or send a message through [place]. You may also schedule an earlier appointment if necessary.  Kevin Putnam, DO [place]

## 2016-03-05 NOTE — Progress Notes (Signed)
Subjective:    Patient ID: Kevin Perry, male    DOB: [birth year], 66 y.o.   MRN: [id number]  Kevin Perry is a 66 y.o. male presenting on 03/05/2016 for Follow-up  HPI   CHRONIC DM, Type 2: Reports chronic history of DM2 diagnosed >10 years ago, has been doing well overall. Last telephone conversation 02/14/16 to discuss change off of the Onglyza due to no longer covered by ins CBGs: Avg 140s, Low 130, High < 150. Checks CBGs x 1 weekly - no glucometer or readings today Meds: Trulicity 0.75mg  weekly on Tuesdays has 2 pens left (missed one dose 1 weeks ago due to improper technique, never on higher dose), Glipizide 10mg  daily, no longer on Onglyza 5mg  (stopped 02/2016) - Prior history of Metformin intolerance in past, only able to take for 2 days, unsure if 500mg  or 1000mg  dose, also has history of s/p cholecystectomy and has had some chronic diarrhea, has not seen GI Reports good compliance. Tolerating well w/o side-effects Currently on ACEi Lifestyle: Diet (regular diet plan, but not adhering to diabetic diet, acknowledges that he does not follow low carb diet) / Exercise (walk constantly at hospital) - Last ophtho eye exam 07/2015 Denies hypoglycemia, polyuria, visual changes, numbness or tingling.  CHRONIC HTN: Reports no recent concerns, overall BP had been controlled, today with elevated reading Current Meds - Amlodipine 10mg , Metoprolol Succinate XL 50mg  daily, Lisinopril 20mg , Furosemide 40mg  daily (for BP and some swelling, no diagnosis of CHF) Reports good compliance, took meds today. Tolerating well, w/o complaints. Lifestyle - Limited exercise recently due to influenza, previously he had been walking regularly at hospital but no regular exercise other than at work - S/p MI CAD, followed Dr Ubaldo Glassing, will follow-up soon Denies CP, dyspnea, HA, dizziness / lightheadedness  LOWER EXTREMITY SWELLING, Chronic Venous Stasis with Dermatitis: - Chronic history of some lower  extremity swelling bilateral legs, unclear extent of work-up for this problem in the past, he was told that not clear diagnosis for this. He has been taking Lasix 40mg  daily for a while now for swelling and BP, not for any other diagnosis. No known history of CHF, but he is s/p MI 10 years ago, no recent ECHO on chart. He does deal with lower extremity dermatitis with frequent itching, follows dermatology - Today improved edema, has been taking Lasix daily - Denies history of clot, localized redness calf pain or swelling asymmetry  FOLLOW-UP INFLUENZA - Last seen 03/01/16, diagnosed with positive Influenza A on rapid flu swab, treated with Tamiflu, and also given close contact wife Tamiflu rx - In interval returned 1/29 for CXR given some persistent cough and generalized weakness, reviewed results, see below, some concern for more vascular congestion vs fluid, no focal pneumonia - Today reports doing better, some generalized weakness still. Temperature has improved, congestion still present but improved - Denies any known nausea, vomiting, diarrhea, abdominal pain   Social History  Substance Use Topics  . Smoking status: Former Smoker    Quit date: 08/16/2004  . Smokeless tobacco: Never Used     Comment: quit 20 plus years  . Alcohol use No     Comment: SOCIAL    Review of Systems Per HPI unless specifically indicated above     Objective:    BP 138/78 (BP Location: Left Arm, Cuff Size: Normal)   Pulse (!) 57   Temp 97.8 F (36.6 C) (Oral)   Resp 16   Ht 6\' 4"  (1.93 m)  Wt 272 lb (123.4 kg)   SpO2 98%   BMI 33.11 kg/m   Wt Readings from Last 3 Encounters:  03/05/16 272 lb (123.4 kg)  03/01/16 273 lb (123.8 kg)  01/16/16 277 lb (125.6 kg)    Physical Exam  Constitutional: He is oriented to person, place, and time. He appears well-developed and well-nourished. No distress.  Improved appearance still slightly tired but mostly well-appearing non toxic, comfortable, cooperative    HENT:  Head: Normocephalic and atraumatic.  Mouth/Throat: Oropharynx is clear and moist.  Non tender maxillary frontal sinus. Still with mild nasal congestion, improved. Oropharynx clear without erythema, exudates, edema or asymmetry.  Eyes: Conjunctivae are normal. Right eye exhibits no discharge. Left eye exhibits no discharge.  Neck: Normal range of motion. Neck supple.  Cardiovascular: Regular rhythm, normal heart sounds and intact distal pulses.   No murmur heard. HR 57, improved on re-check  Pulmonary/Chest: Effort normal. No respiratory distress. He has no wheezes.  Good air movement. Mild crackles bilateral lower lung fields, without distinct focal findings. No wheezing or coarse sounds.  Musculoskeletal: He exhibits edema (Bilateral lower extremity trace pitting edema (improved today)).  Lymphadenopathy:    He has no cervical adenopathy.  Neurological: He is alert and oriented to person, place, and time.  Skin: Skin is warm and dry. Rash (Right lower extremity some areas with evidence of scratching, some punctate rash without significant dry skin or eczema) noted. He is not diaphoretic. No erythema.  Some bilateral lower extremity skin changes with some chronic venous stasis  Psychiatric: His behavior is normal.  Nursing note and vitals reviewed.   I have personally reviewed the radiology report from CXR 03/04/16  EXAM: CHEST  2 VIEW  COMPARISON:  No recent prior .  FINDINGS: Mediastinum and hilar structures normal. Cardiomegaly with mild pulmonary vascular prominence. Mild bilateral interstitial prominence. Mild CHF cannot be excluded. Tiny right pleural effusion cannot be excluded.  IMPRESSION: Cannot exclude mild congestive heart failure with mild pulmonary interstitial edema and tiny right pleural effusion.   Electronically Signed   By: Marcello Moores  Register   On: 03/04/2016 14:49  Recent lab testing below:  Results for orders placed or performed in visit on  03/05/16  POCT HgB A1C  Result Value Ref Range   Hemoglobin A1C 8.7       Assessment & Plan:   Problem List Items Addressed This Visit    Localized swelling of both lower legs    See above discussion Suspected chronic venous stasis, with some derm changes to legs Today swelling is improved Concern for possible CHF cardiac etiology following old MI years ago, given recent fluid on chest x-ray, follow-up with Cardiology Dr Ubaldo Glassing as planned, for now can continue Lasix, if acute worse swelling can do 40 BID and follow-up soon. Otherwise in future goal to switch to Lasix ONLY PRN, and maybe do thiazide daily instead      Relevant Medications   furosemide (LASIX) 40 MG tablet   Hypertension    Initial abnormal elevated BP, improved on manual re-check. Previously had been well controlled, likely higher due to acute influenza illness recently. - No known complications  Plan: 1. Continue current BP regimen - Amlodipine 10mg , Metoprolol Succinate XL 50mg  daily, Lisinopril 20mg , Furosemide 40mg  daily, however if lower extremity turns out to be more chronic venous stasis and if ECHO is normal no CHF etc, then maybe will consider using Lasix only PRN and reducing use, maybe switch to more regular Thiazide diuretic  if BP continues to remain higher 2. 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 Follow-up 3 months      Relevant Medications   amLODipine (NORVASC) 10 MG tablet   metoprolol succinate (TOPROL-XL) 50 MG 24 hr tablet   lisinopril (PRINIVIL,ZESTRIL) 20 MG tablet   rosuvastatin (CRESTOR) 10 MG tablet   furosemide (LASIX) 40 MG tablet   Hyperlipidemia associated with type 2 diabetes mellitus (HCC)    Refilled Rosuvastatin, tolerating well, not due for lipids until 09/2016      Relevant Medications   metFORMIN (GLUCOPHAGE XR) 500 MG 24 hr tablet   amLODipine (NORVASC) 10 MG tablet   metoprolol  succinate (TOPROL-XL) 50 MG 24 hr tablet   lisinopril (PRINIVIL,ZESTRIL) 20 MG tablet   rosuvastatin (CRESTOR) 10 MG tablet   furosemide (LASIX) 40 MG tablet   Diabetes mellitus type 2, uncontrolled, without complications (HCC) - Primary    Significant worsening control A1c up to 8.7 (from last 6.5), without clear attributable cause, has been off Onglyza for 1 month due to not covered by ins, also admits dietary non adherence  Plan: 1. Start new rx Metformin XR 500mg  daily - he had prior history of metformin intolerance years ago but unsure dose, and stopped after 2 days due to some diarrhea, agree to try XR low dose for trial period to see if can tolerate, in future consider titrate up to 750 XR daily 2. Continue other therapy with Glipizide 10, Trulicity 0.75mg  (has 2 pens left and refills already) 3. Check CBG more regularly for short time, at least daily for week, then few times week, bring log to next visit 4. Discussion on appropriate DM diet, patient has already been to [place] and knows what to eat. He agrees to dramatically change his diet, reduce portion, lower carb, limit sugar, continue walking exercise may try some regular exercise outside of work 5. On statin, ACEi, ASA 6. Due for Prevnar 13 pneumonia vaccine - currently ill s/p flu hold for now, given in 3 months at next DM follow-up 7. Follow-up 3 months DM A1c, return sooner 6 weeks for CBG log review if worsening, or not tolerate metformin, then will double dose of Trulicity to 1.5mg  daily      Relevant Medications   metFORMIN (GLUCOPHAGE XR) 500 MG 24 hr tablet   lisinopril (PRINIVIL,ZESTRIL) 20 MG tablet   rosuvastatin (CRESTOR) 10 MG tablet   Other Relevant Orders   POCT HgB A1C (Completed)   Coronary artery disease involving native coronary artery without angina pectoris    Stable without angina or acute concerns, does have some chronic LE swelling thought combination of chronic venous stasis, however  review of recent CXR s/p influenza with some increased vascular congestion, raises concern for possible CHF, maybe some residual myopathy from prior MI. - No recent ECHO - Followed by Cardiology, Dr Ubaldo Glassing  Plan 1. Continue current med management ASA, statin, ACEi, BB per Cards 2. Advised to follow-up with Cardiology sooner to discuss abnormal chest x-ray and edema, consider ECHO      Relevant Medications   amLODipine (NORVASC) 10 MG tablet   metoprolol succinate (TOPROL-XL) 50 MG 24 hr tablet   lisinopril (PRINIVIL,ZESTRIL) 20 MG tablet   rosuvastatin (CRESTOR) 10 MG tablet   furosemide (LASIX) 40 MG tablet     Influenza A - Improving - Confirmed dx 03/01/16, treated with Tamiflu - Remains afebrile,  clinically improved, no evidence of focal PNA or complication, recent CXR 03/04/16 - Finish Tamiflu, may start OTC Mucinex (withoug DM, decongest) follow-up criteria given  Follow up plan: Return in about 3 months (around 06/03/2016) for blood pressure, diabetes.  [name], [facility] 03/06/2016, 6:57 AM

## 2016-03-06 NOTE — Assessment & Plan Note (Signed)
Stable without angina or acute concerns, does have some chronic LE swelling thought combination of chronic venous stasis, however review of recent CXR s/p influenza with some increased vascular congestion, raises concern for possible CHF, maybe some residual myopathy from prior MI. - No recent ECHO - Followed by Cardiology, Dr Ubaldo Glassing  Plan 1. Continue current med management ASA, statin, ACEi, BB per Cards 2. Advised to follow-up with Cardiology sooner to discuss abnormal chest x-ray and edema, consider ECHO

## 2016-03-06 NOTE — Assessment & Plan Note (Signed)
See above discussion Suspected chronic venous stasis, with some derm changes to legs Today swelling is improved Concern for possible CHF cardiac etiology following old MI years ago, given recent fluid on chest x-ray, follow-up with Cardiology Dr Ubaldo Glassing as planned, for now can continue Lasix, if acute worse swelling can do 40 BID and follow-up soon. Otherwise in future goal to switch to Lasix ONLY PRN, and maybe do thiazide daily instead

## 2016-03-06 NOTE — Assessment & Plan Note (Signed)
Initial abnormal elevated BP, improved on manual re-check. Previously had been well controlled, likely higher due to acute influenza illness recently. - No known complications  Plan: 1. Continue current BP regimen - Amlodipine 10mg , Metoprolol Succinate XL 50mg  daily, Lisinopril 20mg , Furosemide 40mg  daily, however if lower extremity turns out to be more chronic venous stasis and if ECHO is normal no CHF etc, then maybe will consider using Lasix only PRN and reducing use, maybe switch to more regular Thiazide diuretic if BP continues to remain higher 2. 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 Follow-up 3 months

## 2016-03-06 NOTE — Assessment & Plan Note (Signed)
Significant worsening control A1c up to 8.7 (from last 6.5), without clear attributable cause, has been off Onglyza for 1 month due to not covered by ins, also admits dietary non adherence  Plan: 1. Start new rx Metformin XR 500mg  daily - he had prior history of metformin intolerance years ago but unsure dose, and stopped after 2 days due to some diarrhea, agree to try XR low dose for trial period to see if can tolerate, in future consider titrate up to 750 XR daily 2. Continue other therapy with Glipizide 10, Trulicity 0.75mg  (has 2 pens left and refills already) 3. Check CBG more regularly for short time, at least daily for week, then few times week, bring log to next visit 4. Discussion on appropriate DM diet, patient has already been to [place] and knows what to eat. He agrees to dramatically change his diet, reduce portion, lower carb, limit sugar, continue walking exercise may try some regular exercise outside of work 5. On statin, ACEi, ASA 6. Due for Prevnar 13 pneumonia vaccine - currently ill s/p flu hold for now, given in 3 months at next DM follow-up 7. Follow-up 3 months DM A1c, return sooner 6 weeks for CBG log review if worsening, or not tolerate metformin, then will double dose of Trulicity to 1.5mg  daily

## 2016-03-06 NOTE — Assessment & Plan Note (Signed)
Refilled Rosuvastatin, tolerating well, not due for lipids until 09/2016

## 2016-03-14 ENCOUNTER — Other Ambulatory Visit: Payer: Self-pay | Admitting: Family Medicine

## 2016-03-14 DIAGNOSIS — F329 Major depressive disorder, single episode, unspecified: Secondary | ICD-10-CM

## 2016-03-14 DIAGNOSIS — F32A Depression, unspecified: Secondary | ICD-10-CM

## 2016-03-14 DIAGNOSIS — IMO0001 Reserved for inherently not codable concepts without codable children: Secondary | ICD-10-CM

## 2016-03-14 DIAGNOSIS — E1165 Type 2 diabetes mellitus with hyperglycemia: Secondary | ICD-10-CM

## 2016-03-14 MED ORDER — DULAGLUTIDE 0.75 MG/0.5ML ~~LOC~~ SOAJ: 0.7500 mg | SUBCUTANEOUS | 2 refills | Status: DC

## 2016-03-14 MED ORDER — ESCITALOPRAM OXALATE 20 MG PO TABS: 20.0000 mg | ORAL_TABLET | Freq: Every day | ORAL | 3 refills | Status: DC

## 2016-05-01 ENCOUNTER — Telehealth: Payer: Self-pay

## 2016-05-01 NOTE — Telephone Encounter (Signed)
Returned patient's phone call regarding scheduling surgery

## 2016-05-03 ENCOUNTER — Telehealth: Payer: Self-pay

## 2016-05-03 NOTE — Telephone Encounter (Signed)
Returned patient's call in regards to scheduling surgery

## 2016-05-07 ENCOUNTER — Telehealth: Payer: Self-pay | Admitting: *Deleted

## 2016-05-07 NOTE — Telephone Encounter (Signed)
"  I'm calling to schedule a surgery with Dr. Milinda Pointer."  He does surgery on Fridays.  Do you have a date in mind?  "I'd like to do it in the middle of May."  He can do it on May 11.  "That date will be perfect."  Someone from the surgical center will call you a day or two prior to surgery date with the arrival time.  Dr. Milinda Pointer wants medical clearance.  Which doctor are we getting clearance from?  "Dr. Ubaldo Glassing in [place] is my Cardiologist.  I just saw him recently and I passed my stress test."

## 2016-05-16 ENCOUNTER — Encounter: Payer: Self-pay | Admitting: *Deleted

## 2016-05-28 ENCOUNTER — Telehealth: Payer: Self-pay | Admitting: *Deleted

## 2016-05-28 NOTE — Telephone Encounter (Signed)
"  I have surgery scheduled for May 11.  I have to go out of town for about 7 weeks due to my job.  So, I'd like to cancel my May 11."

## 2016-05-31 NOTE — Telephone Encounter (Signed)
I left patient a message that I got his message.  I will cancel his surgery at the surgical center.  I called and left Caren Griffins a message at [facility] that patient canceled his surgery due to having to go out of town for 7 weeks for work.

## 2016-06-19 ENCOUNTER — Encounter: Payer: Managed Care, Other (non HMO) | Admitting: Podiatry

## 2016-06-28 ENCOUNTER — Encounter: Payer: Managed Care, Other (non HMO) | Admitting: Podiatry

## 2016-07-19 ENCOUNTER — Other Ambulatory Visit: Payer: Self-pay

## 2016-07-19 DIAGNOSIS — E119 Type 2 diabetes mellitus without complications: Secondary | ICD-10-CM

## 2016-07-19 MED ORDER — GLIPIZIDE 10 MG PO TABS
10.0000 mg | ORAL_TABLET | Freq: Every day | ORAL | 0 refills | Status: DC
Start: 2016-07-19 — End: 2016-09-03

## 2016-08-02 ENCOUNTER — Other Ambulatory Visit: Payer: Self-pay | Admitting: Family Medicine

## 2016-08-02 DIAGNOSIS — IMO0001 Reserved for inherently not codable concepts without codable children: Secondary | ICD-10-CM

## 2016-08-02 DIAGNOSIS — E1165 Type 2 diabetes mellitus with hyperglycemia: Principal | ICD-10-CM

## 2016-09-03 ENCOUNTER — Other Ambulatory Visit: Payer: Self-pay | Admitting: Family Medicine

## 2016-09-03 ENCOUNTER — Ambulatory Visit (INDEPENDENT_AMBULATORY_CARE_PROVIDER_SITE_OTHER): Payer: Managed Care, Other (non HMO) | Admitting: Family Medicine

## 2016-09-03 ENCOUNTER — Encounter: Payer: Self-pay | Admitting: Family Medicine

## 2016-09-03 VITALS — BP 136/70 | HR 58 | Temp 98.1°F | Resp 16 | Ht 76.0 in | Wt 275.0 lb

## 2016-09-03 DIAGNOSIS — E1165 Type 2 diabetes mellitus with hyperglycemia: Secondary | ICD-10-CM

## 2016-09-03 DIAGNOSIS — E1169 Type 2 diabetes mellitus with other specified complication: Secondary | ICD-10-CM

## 2016-09-03 DIAGNOSIS — IMO0001 Reserved for inherently not codable concepts without codable children: Secondary | ICD-10-CM

## 2016-09-03 DIAGNOSIS — I10 Essential (primary) hypertension: Secondary | ICD-10-CM

## 2016-09-03 DIAGNOSIS — I2510 Atherosclerotic heart disease of native coronary artery without angina pectoris: Secondary | ICD-10-CM

## 2016-09-03 DIAGNOSIS — Z125 Encounter for screening for malignant neoplasm of prostate: Secondary | ICD-10-CM

## 2016-09-03 DIAGNOSIS — E785 Hyperlipidemia, unspecified: Secondary | ICD-10-CM

## 2016-09-03 LAB — POCT GLYCOSYLATED HEMOGLOBIN (HGB A1C): Hemoglobin A1C: 8.6 — AB (ref ?–5.7)

## 2016-09-03 MED ORDER — GLIPIZIDE 10 MG PO TABS: 10.0000 mg | ORAL_TABLET | Freq: Every day | ORAL | 3 refills | Status: DC

## 2016-09-03 MED ORDER — DULAGLUTIDE 1.5 MG/0.5ML ~~LOC~~ SOAJ: 1.5000 mg | SUBCUTANEOUS | 3 refills | Status: DC

## 2016-09-03 MED ORDER — METFORMIN HCL ER 750 MG PO TB24: 750.0000 mg | ORAL_TABLET | Freq: Every day | ORAL | 3 refills | Status: DC

## 2016-09-03 NOTE — Patient Instructions (Addendum)
Thank you for coming to the clinic today.  1. Increased Dulaglutide (Trulicity) 1.5mg  weekly injection - new pens sent, should be 3 month supply with refills 2. Increased Metformin XR from 500 to 750mg  daily in morning - 3 month supply 3. Refilled GLipizide  4. Reminder to schedule Annual Diabetic Eye Exam - have them FAX [place] a copy of record to get credit  5. Start checking CBG AM fasting glucose at home M - W - Fri  Please schedule a Follow-up Appointment to: Return in about 6 weeks (around 10/15/2016) for Medicare Physical CPE.  If you have any other questions or concerns, please feel free to call the clinic or send a message through [place]. You may also schedule an earlier appointment if necessary.  Additionally, you may be receiving a survey about your experience at our clinic within a few days to 1 week by e-mail or mail. We value your feedback.  [name], DO [place]

## 2016-09-03 NOTE — Progress Notes (Signed)
Subjective:    Patient ID: Kevin Perry, male    DOB: 1950/03/03, 66 y.o.   MRN: [id number]  Kevin Perry is a 66 y.o. male presenting on 09/03/2016 for Hypertension and Diabetes (ranges 150 highest BS 180 and lowest 140)  HPI   CHRONIC DM, Type 2: - Last visit with me 03/05/16, for diabetes, treated with new restart on Metformin XR 500mg  daily (had intolerance on regular metformin), also switched off Onglyza in past, continued on Glipizide and Trulicity 6.19 and agreed to try more lifestyle modification before adjusting meds further, see prior notes for background information. - Today patient reports doing well, but surprised A1c not improved CBGs: Avg readings 150-170, no significant lows, no reading >200. Only checking 1-2x weekly Meds: Trulicity 0.75mg  weekly, Glipizide 10mg  daily, Metformin XR 500mg  daily Reports good compliance. Tolerating well w/o side-effects Currently on ACEi Lifestyle: - Diet (some improvement towards DM diet but still not always adhering) - Exercise (walk constantly at hospital) - Last ophtho eye exam 07/2015, [facility] due to re-schedule Denies hypoglycemia, polyuria, visual changes, numbness or tingling.  CHRONIC HTN: Reports home reading BP outside office usually 130-140/70-80 Current Meds - Amlodipine 10mg , Metoprolol Succinate XL 50mg  daily, Lisinopril 20mg , Furosemide 40mg  daily (for BP and some swelling, no diagnosis of CHF) Reports good compliance, took meds today. Tolerating well, w/o complaints - S/p MI CAD, followed Dr Ubaldo Glassing, will follow-up soon Denies CP, dyspnea, HA, dizziness / lightheadedness  Health Maintenance: - Due for Pneumonia vaccine age >64 based on our records, he believes may have had done through work, will check records and notify us, otherwise will get Prevnar-13 at next visit   Social History  Substance Use Topics  . Smoking status: Former Smoker    Quit date: 08/16/2004  . Smokeless tobacco: Former Systems developer       Comment: quit 20 plus years  . Alcohol use No     Comment: SOCIAL    Review of Systems Per HPI unless specifically indicated above     Objective:    BP 136/70 (BP Location: Left Arm, Cuff Size: Normal)   Pulse (!) 58   Temp 98.1 F (36.7 C) (Oral)   Resp 16   Ht 6\' 4"  (1.93 m)   Wt 275 lb (124.7 kg)   BMI 33.47 kg/m   Wt Readings from Last 3 Encounters:  09/03/16 275 lb (124.7 kg)  03/05/16 272 lb (123.4 kg)  03/01/16 273 lb (123.8 kg)    Physical Exam  Constitutional: He is oriented to person, place, and time. He appears well-developed and well-nourished. No distress.  Well-appearing, comfortable, cooperative, overweight  HENT:  Head: Normocephalic and atraumatic.  Mouth/Throat: Oropharynx is clear and moist.  Eyes: Conjunctivae are normal. Right eye exhibits no discharge. Left eye exhibits no discharge.  Neck: Normal range of motion. Neck supple. No thyromegaly present.  Cardiovascular: Normal rate, regular rhythm, normal heart sounds and intact distal pulses.   No murmur heard. Pulmonary/Chest: Effort normal and breath sounds normal. No respiratory distress. He has no wheezes. He has no rales.  Musculoskeletal: Normal range of motion. He exhibits no edema.  Lymphadenopathy:    He has no cervical adenopathy.  Neurological: He is alert and oriented to person, place, and time.  Skin: Skin is warm and dry. No rash noted. He is not diaphoretic. No erythema.  Psychiatric: He has a normal mood and affect. His behavior is normal.  Well groomed, good eye contact, normal speech and thoughts  Nursing note and vitals reviewed.   Diabetic Foot Exam - Simple   Simple Foot Form Diabetic Foot exam was performed with the following findings:  Yes 09/03/2016  2:12 PM  Visual Inspection See comments:  Yes Sensation Testing Intact to touch and monofilament testing bilaterally:  Yes Pulse Check Posterior Tibialis and Dorsalis pulse intact bilaterally:  Yes Comments Left third  toe with s/p distal amputation well healed, and has deformity with hammer toe without ulceration on R 3rd toe. Some forefoot callus formation bilateral     Results for orders placed or performed in visit on 09/03/16  POCT HgB A1C  Result Value Ref Range   Hemoglobin A1C 8.6 (A) 5.7     Recent Labs  09/19/15 1314 03/05/16 1652 09/03/16 2108  HGBA1C 6.5 8.7 8.6*       Assessment & Plan:   Problem List Items Addressed This Visit    Hypertension    Controlled HTN, mildly initial, repeat manual re-check improved - Home BP readings appropriate  Complication with CAD   Plan:  1. Continue current BP regimen Amlodipine 10mg , Metoprolol Succinate XL 50mg  daily, Lisinopril 20mg , Furosemide 40mg  daily 2. Encourage improved lifestyle - low sodium diet, regular exercise 3. Continue monitor BP outside office, bring readings to next visit, if persistently >140/90 or new symptoms notify office sooner 4. Follow-up 6 wks labs and annual phys then q 3 mo      Diabetes mellitus type 2, uncontrolled, without complications ([place]) - Primary    Remains sub-optimal control DM with A1c 8.6 (unchanged from 8.7) No complications or hypoglycemia  Plan:  1. Increase Metformin XR 500 to 750mg  daily in AM - future consider x 2 of 500mg  tabs for 1000mg  2. Increase Dulaglutide (trulicity) from 1.16 to 1.5mg  weekly - new pens ordered (90 day supply) 3. Encourage improved lifestyle - low carb, low sugar diet, reduce portion size, continue improving regular exercise 3. Check CBG, bring log to next visit for review - check 3x weekly 4. Continue ASA, ACEi, Statin 5. DM Foot exam done today / Advised to schedule DM ophtho exam, send record 6. Follow-up 6 wk labs (no A1c) then in 3 months DM A1c      Relevant Medications   Dulaglutide 1.5 MG/0.5ML SOPN   metFORMIN (GLUCOPHAGE XR) 750 MG 24 hr tablet   glipiZIDE (GLUCOTROL) 10 MG tablet   Other Relevant Orders   POCT HgB A1C (Completed)    Other Visit  Diagnoses    Diabetes mellitus without complication (HCC)       Relevant Medications   Dulaglutide 1.5 MG/0.5ML SOPN   metFORMIN (GLUCOPHAGE XR) 750 MG 24 hr tablet   glipiZIDE (GLUCOTROL) 10 MG tablet     Follow up plan: Return in about 6 weeks (around 10/15/2016) for Medicare Physical CPE.  [name], [facility] 09/03/2016, 9:16 PM

## 2016-09-03 NOTE — Assessment & Plan Note (Signed)
Controlled HTN, mildly initial, repeat manual re-check improved - Home BP readings appropriate  Complication with CAD   Plan:  1. Continue current BP regimen Amlodipine 10mg , Metoprolol Succinate XL 50mg  daily, Lisinopril 20mg , Furosemide 40mg  daily 2. Encourage improved lifestyle - low sodium diet, regular exercise 3. Continue monitor BP outside office, bring readings to next visit, if persistently >140/90 or new symptoms notify office sooner 4. Follow-up 6 wks labs and annual phys then q 3 mo

## 2016-09-03 NOTE — Assessment & Plan Note (Addendum)
Remains sub-optimal control DM with A1c 8.6 (unchanged from 8.7) No complications or hypoglycemia  Plan:  1. Increase Metformin XR 500 to 750mg  daily in AM - future consider x 2 of 500mg  tabs for 1000mg  2. Increase Dulaglutide (trulicity) from 8.28 to 1.5mg  weekly - new pens ordered (90 day supply) 3. Encourage improved lifestyle - low carb, low sugar diet, reduce portion size, continue improving regular exercise 3. Check CBG, bring log to next visit for review - check 3x weekly 4. Continue ASA, ACEi, Statin 5. DM Foot exam done today / Advised to schedule DM ophtho exam, send record 6. Follow-up 6 wk labs (no A1c) then in 3 months DM A1c

## 2016-10-04 ENCOUNTER — Telehealth: Payer: Self-pay

## 2016-10-04 DIAGNOSIS — E1165 Type 2 diabetes mellitus with hyperglycemia: Principal | ICD-10-CM

## 2016-10-04 DIAGNOSIS — IMO0001 Reserved for inherently not codable concepts without codable children: Secondary | ICD-10-CM

## 2016-10-04 MED ORDER — METFORMIN HCL ER 500 MG PO TB24: 500.0000 mg | ORAL_TABLET | Freq: Every day | ORAL | 3 refills | Status: DC

## 2016-10-04 NOTE — Telephone Encounter (Signed)
Patient called and reported stomach pain/cramps for 1 week.  Not felt good since Metformin was increased.  He reported that he was told that if he could not tolerate the metformin then you would call something else in.  Please advise

## 2016-10-04 NOTE — Telephone Encounter (Signed)
Attempted to call patient, did not reach, left voicemail. After review of last note from 08/2016, I do not specifically see the reference of a med to switch to, but patient previously tolerated the Metformin 500mg  XR, and now GI intolerance on the higher dose 750.  Best option is to STOP Metformin XR 750mg  daily for about 1 week, to let GI system settle down, and then resume new rx of old medicine Metformin XR 500mg  daily in morning WITH FOOD.  His other medicine Trulicity was already increased last visit. We can make other adjustments in office in future, otherwise would remain off metformin if cannot tolerate it at all.  [name], [facility] 10/04/2016, 2:16 PM

## 2016-10-22 ENCOUNTER — Other Ambulatory Visit: Payer: Managed Care, Other (non HMO)

## 2016-10-24 ENCOUNTER — Other Ambulatory Visit: Payer: Managed Care, Other (non HMO)

## 2016-10-24 DIAGNOSIS — E1165 Type 2 diabetes mellitus with hyperglycemia: Principal | ICD-10-CM

## 2016-10-24 DIAGNOSIS — I10 Essential (primary) hypertension: Secondary | ICD-10-CM

## 2016-10-24 DIAGNOSIS — I2510 Atherosclerotic heart disease of native coronary artery without angina pectoris: Secondary | ICD-10-CM

## 2016-10-24 DIAGNOSIS — IMO0001 Reserved for inherently not codable concepts without codable children: Secondary | ICD-10-CM

## 2016-10-24 DIAGNOSIS — Z125 Encounter for screening for malignant neoplasm of prostate: Secondary | ICD-10-CM

## 2016-10-24 DIAGNOSIS — E1169 Type 2 diabetes mellitus with other specified complication: Secondary | ICD-10-CM

## 2016-10-24 DIAGNOSIS — E785 Hyperlipidemia, unspecified: Secondary | ICD-10-CM

## 2016-10-24 LAB — CBC WITH DIFFERENTIAL/PLATELET
Basophils Absolute: 62 cells/uL (ref 0–200)
Basophils Relative: 1 %
Eosinophils Absolute: 273 cells/uL (ref 15–500)
Eosinophils Relative: 4.4 %
HCT: 44.6 % (ref 38.5–50.0)
Hemoglobin: 15.2 g/dL (ref 13.2–17.1)
Lymphs Abs: 1823 cells/uL (ref 850–3900)
MCH: 31.5 pg (ref 27.0–33.0)
MCHC: 34.1 g/dL (ref 32.0–36.0)
MCV: 92.3 fL (ref 80.0–100.0)
MPV: 9.2 fL (ref 7.5–12.5)
Monocytes Relative: 9.4 %
Neutro Abs: 3460 cells/uL (ref 1500–7800)
Neutrophils Relative %: 55.8 %
Platelets: 185 10*3/uL (ref 140–400)
RBC: 4.83 10*6/uL (ref 4.20–5.80)
RDW: 14.4 % (ref 11.0–15.0)
Total Lymphocyte: 29.4 %
WBC mixed population: 583 cells/uL (ref 200–950)
WBC: 6.2 10*3/uL (ref 3.8–10.8)

## 2016-10-24 LAB — COMPLETE METABOLIC PANEL WITH GFR
AG Ratio: 1.9 (calc) (ref 1.0–2.5)
ALT: 31 U/L (ref 9–46)
AST: 24 U/L (ref 10–35)
Albumin: 4.2 g/dL (ref 3.6–5.1)
Alkaline phosphatase (APISO): 44 U/L (ref 40–115)
BUN: 12 mg/dL (ref 7–25)
CO2: 29 mmol/L (ref 20–32)
Calcium: 9.1 mg/dL (ref 8.6–10.3)
Chloride: 102 mmol/L (ref 98–110)
Creat: 0.86 mg/dL (ref 0.70–1.25)
GFR, Est African American: 105 mL/min/{1.73_m2} (ref >=60)
GFR, Est Non African American: 90 mL/min/{1.73_m2} (ref >=60)
Globulin: 2.2 g/dL (calc) (ref 1.9–3.7)
Glucose, Bld: 195 mg/dL — ABNORMAL HIGH (ref 65–99)
Potassium: 3.9 mmol/L (ref 3.5–5.3)
Sodium: 137 mmol/L (ref 135–146)
Total Bilirubin: 0.7 mg/dL (ref 0.2–1.2)
Total Protein: 6.4 g/dL (ref 6.1–8.1)

## 2016-10-24 LAB — LIPID PANEL
Cholesterol: 107 mg/dL (ref <200)
HDL: 39 mg/dL — ABNORMAL LOW (ref >40)
LDL Cholesterol (Calc): 43 mg/dL (calc)
Non-HDL Cholesterol (Calc): 68 mg/dL (calc) (ref <130)
Total CHOL/HDL Ratio: 2.7 (calc) (ref <5.0)
Triglycerides: 173 mg/dL — ABNORMAL HIGH (ref <150)

## 2016-10-25 LAB — PSA, TOTAL WITH REFLEX TO PSA, FREE: PSA, Total: 0.5 ng/mL (ref <=4.0)

## 2016-10-28 ENCOUNTER — Ambulatory Visit (INDEPENDENT_AMBULATORY_CARE_PROVIDER_SITE_OTHER): Payer: Managed Care, Other (non HMO) | Admitting: Family Medicine

## 2016-10-28 ENCOUNTER — Encounter: Payer: Self-pay | Admitting: Family Medicine

## 2016-10-28 VITALS — BP 123/74 | HR 57 | Temp 98.3°F | Resp 16 | Ht 76.0 in | Wt 275.0 lb

## 2016-10-28 DIAGNOSIS — E1165 Type 2 diabetes mellitus with hyperglycemia: Secondary | ICD-10-CM

## 2016-10-28 DIAGNOSIS — E785 Hyperlipidemia, unspecified: Secondary | ICD-10-CM | POA: Diagnosis not present

## 2016-10-28 DIAGNOSIS — E1169 Type 2 diabetes mellitus with other specified complication: Secondary | ICD-10-CM

## 2016-10-28 DIAGNOSIS — H66004 Acute suppurative otitis media without spontaneous rupture of ear drum, recurrent, right ear: Secondary | ICD-10-CM

## 2016-10-28 DIAGNOSIS — J40 Bronchitis, not specified as acute or chronic: Secondary | ICD-10-CM

## 2016-10-28 DIAGNOSIS — I10 Essential (primary) hypertension: Secondary | ICD-10-CM

## 2016-10-28 DIAGNOSIS — IMO0001 Reserved for inherently not codable concepts without codable children: Secondary | ICD-10-CM

## 2016-10-28 MED ORDER — AMOXICILLIN-POT CLAVULANATE 875-125 MG PO TABS: 1.0000 | ORAL_TABLET | Freq: Two times a day (BID) | ORAL | 0 refills | Status: DC

## 2016-10-28 NOTE — Progress Notes (Signed)
Subjective:    Patient ID: Kevin Perry, male    DOB: 1950/09/22, 66 y.o.   MRN: [id number]  Kevin Perry is a 66 y.o. male presenting on 10/28/2016 for Annual Exam   HPI   Here for Annual Physical and Lab Review  Additional complaint - Bronchitis / R AOM - Recent history of URI bronchitis symptoms several weeks ago, he went to urgent care 3 weeks ago, was given rx Azithromycin and injection steroid due to bronchitis and wheezing. Overall he significantly improved, was also diagnosed with R ear AOM - He took mucinex previously, now off - Now has some persistent cough with occasional wheezing, still has R ear discomfort - Admits residual sinus pressure and congestion deeper, seems to be improving - Denies fever/chills, R ear pain, discharge, dyspnea, sinus pain  CHRONIC DM, Type 2: - Last visit with me 7/31, for same problem, treated with increased Trulicity from 7.25 to 3.6UY weekly inj, and reduced Metformin XR from 750 to 584m daily due to GI intolerance, see prior notes for background information. - Today patient reports no new concerns, but he is unsure if sugar is much improved - CBGs: Avg AM reading 160-180, PM reading after supper 1403-474Meds: Trulicity 12.5ZDweekly, Glipizide 157mdaily, Metformin XR 50020maily Reports good compliance. 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  CHRONIC HTN: Reports no new concerns Current Meds - Amlodipine 8m53mily, Lisinopril 20mg1mly, Metoprolol XL 50mg 65my   Reports good compliance, took meds today. Tolerating well, w/o complaints.  HYPERLIPIDEMIA: - Reports no concerns. Last lipid panel 10/2016, improved control, still mild elevated TG -  Currently taking Rosuvastatin 8mg, 37mrating well without side effects or myalgias  Health Maintenance: - Due for first Pneumonia vaccine - Prevnar-13, will defer today due to current respiratory illness, will return for this and future 1 year later Pneumovax-23 - Due for Flu Shot, will get this at work through hospital 11/06/16 - Colon CA Screening - completed 04/05/15, Cologuard Negative. UTD currently, due for repeat cologuard 04/2018   Depression screen PHQ 2/9[facility]24/2018 09/03/2016 03/09/2015  Decreased Interest 0 0 0  Down, Depressed, Hopeless 0 0 0  PHQ - 2 Score 0 0 0  Altered sleeping - 0 -  Tired, decreased energy - 0 -  Change in appetite - 0 -  Feeling bad or failure about yourself  - 0 -  Trouble concentrating - 0 -  Moving slowly or fidgety/restless - 0 -  Suicidal thoughts - 0 -  PHQ-9 Score - 0 -    Past Medical History:  Diagnosis Date  . Anxiety   . Arthritis   . Diabetes mellitus without complication (HCC)   [place]ension   . Myocardial infarction (HCC) [facility]  Past Surgical History:  Procedure Laterality Date  . CHOLECYSTECTOMY    . CORONARY ANGIOPLASTY WITH STENT PLACEMENT N/A 2008  . FOOT SURGERY Left    DOS 8.1.14 HALLIX IPJ FUSION, SECOND MET OSTEOTOMY W/SCREW, HAM TOE REPAIR 2,,4 , PARTIAL AMP 3RD DIGIT   . GALLBLADDER SURGERY    . KNEE SURERY Right   . TOTAL KNEE ARTHROPLASTY Right 08/30/2014   Procedure: TOTAL KNEE ARTHROPLASTY;  Surgeon: MichaelHessie KnowsLocation: [facility];  Service: Orthopedics;  Laterality: Right;   Social History   Social History  . Marital status: Married    Spouse name: N/A  . Number of children: N/A  . Years of education: N/A   Occupational History  . Not on file.   Social History Main Topics  . Smoking status: Former Smoker    Quit date: 08/16/2004  . Smokeless tobacco: Former Systems developer     Comment: quit 20 plus years  . Alcohol use No     Comment: SOCIAL  . Drug use: No  . Sexual activity: Not on file   Other  Topics Concern  . Not on file   Social History Narrative  . No narrative on file   No family history on file. Current Outpatient Prescriptions on File Prior to Visit  Medication Sig  . amLODipine (NORVASC) 10 MG tablet Take 1 tablet (10 mg total) by mouth daily.  Marland Kitchen aspirin 81 MG tablet Take 81 mg by mouth daily.  Marland Kitchen escitalopram (LEXAPRO) 20 MG tablet Take 1 tablet (20 mg total) by mouth at bedtime.  . fluticasone (FLONASE) 50 MCG/ACT nasal spray Place 2 sprays into both nostrils daily. Use for 4-6 weeks then stop and use seasonally or as needed.  . folic acid (FOLVITE) 1 MG tablet   . furosemide (LASIX) 40 MG tablet Take 1 tablet (40 mg total) by mouth daily as needed.  Marland Kitchen glipiZIDE (GLUCOTROL) 10 MG tablet Take 1 tablet (10 mg total) by mouth daily before breakfast.  . Insulin Pen Needle 31G X 5 MM MISC 1 each by Does not apply route daily.  Marland Kitchen lisinopril (PRINIVIL,ZESTRIL) 20 MG tablet Take 1 tablet (20 mg total) by mouth every morning.  . loratadine (CLARITIN) 10 MG tablet Take 10 mg by mouth every morning.  . metFORMIN (GLUCOPHAGE-XR) 500 MG 24 hr tablet Take 1 tablet (500 mg total) by mouth daily with breakfast.  . methotrexate (RHEUMATREX) 2.5 MG tablet   . metoprolol succinate (TOPROL-XL) 50 MG 24 hr tablet Take 1 tablet (50 mg total) by mouth daily. Take with or immediately following a meal.  . rosuvastatin (CRESTOR) 10 MG tablet Take 1 tablet (10 mg total) by mouth every morning.  . Dulaglutide 1.5 MG/0.5ML SOPN Inject 1.5 mg into the skin once a week.   No current facility-administered medications on file prior to visit.     Review of Systems  Constitutional: Negative for activity change, appetite change, chills, diaphoresis, fatigue, fever and unexpected weight change.  HENT: Positive for ear pain (R) and postnasal drip. Negative for congestion and hearing loss.   Eyes: Negative for visual disturbance.  Respiratory: Positive for cough and wheezing. Negative for apnea,  choking, chest tightness and shortness of breath.   Cardiovascular: Negative for chest pain, palpitations and leg swelling.  Gastrointestinal: Negative for abdominal pain, anal bleeding, blood in stool, constipation, diarrhea, nausea and vomiting.  Endocrine: Negative for cold intolerance and polyuria.  Genitourinary: Negative for difficulty urinating, dysuria, frequency and hematuria.  Musculoskeletal: Negative for arthralgias, back pain and neck pain.  Skin: Negative for rash.  Allergic/Immunologic: Negative for environmental allergies.  Neurological: Negative for dizziness, weakness, light-headedness, numbness and headaches.  Hematological: Negative for adenopathy.  Psychiatric/Behavioral: Negative for behavioral problems, dysphoric mood and sleep disturbance.   Per HPI unless specifically indicated above      Objective:    BP 123/74   Pulse (!) 57   Temp 98.3 F (36.8 C) (Oral)   Resp 16   Ht '6\' 4"'  (1.93 m)  Wt 275 lb (124.7 kg)   BMI 33.47 kg/m   Wt Readings from Last 3 Encounters:  10/28/16 275 lb (124.7 kg)  09/03/16 275 lb (124.7 kg)  03/05/16 272 lb (123.4 kg)    Physical Exam  Constitutional: He is oriented to person, place, and time. He appears well-developed and well-nourished. No distress.  Well-appearing, comfortable, cooperative  HENT:  Head: Normocephalic and atraumatic.  Mouth/Throat: Oropharynx is clear and moist.  Frontal / maxillary sinuses non-tender. Nares patent without purulence or edema. R TM with chronic changes with old scar and perforation, some residual purulence but no erythema. L TM mostly clear with some effusion mild but no purulence, no erythema. Oropharynx mild posterior pharyngeal drainage without erythema, exudates, edema or asymmetry.  Eyes: Pupils are equal, round, and reactive to light. Conjunctivae and EOM are normal. Right eye exhibits no discharge. Left eye exhibits no discharge.  Neck: Normal range of motion. Neck supple. No  thyromegaly present.  Cardiovascular: Normal rate, regular rhythm, normal heart sounds and intact distal pulses.   No murmur heard. Pulmonary/Chest: Effort normal. No respiratory distress. He has wheezes (Occasional scattered exp wheeze). He has no rales.  Mild rhonchi that improve with cough. No focal crackles. Good air movement. Speaks full sentences. Occasional cough.  Abdominal: Soft. Bowel sounds are normal. He exhibits no distension and no mass. There is no tenderness.  Musculoskeletal: Normal range of motion. He exhibits no edema or tenderness.  Upper / Lower Extremities: - Normal muscle tone, strength bilateral upper extremities 5/5, lower extremities 5/5  Lymphadenopathy:    He has no cervical adenopathy.  Neurological: He is alert and oriented to person, place, and time.  Distal sensation intact to light touch all extremities  Skin: Skin is warm and dry. No rash noted. He is not diaphoretic. No erythema.  Psychiatric: He has a normal mood and affect. His behavior is normal.  Well groomed, good eye contact, normal speech and thoughts  Nursing note and vitals reviewed.  Results for orders placed or performed in visit on 10/24/16  COMPLETE METABOLIC PANEL WITH GFR  Result Value Ref Range   Glucose, Bld 195 (H) 65 - 99 mg/dL   BUN 12 7 - 25 mg/dL   Creat 0.86 0.70 - 1.25 mg/dL   GFR, Est Non African American 90 > OR = 60 mL/min/1.53m   GFR, Est African American 105 > OR = 60 mL/min/1.726m  BUN/Creatinine Ratio NOT APPLICABLE 6 - 22 (calc)   Sodium 137 135 - 146 mmol/L   Potassium 3.9 3.5 - 5.3 mmol/L   Chloride 102 98 - 110 mmol/L   CO2 29 20 - 32 mmol/L   Calcium 9.1 8.6 - 10.3 mg/dL   Total Protein 6.4 6.1 - 8.1 g/dL   Albumin 4.2 3.6 - 5.1 g/dL   Globulin 2.2 1.9 - 3.7 g/dL (calc)   AG Ratio 1.9 1.0 - 2.5 (calc)   Total Bilirubin 0.7 0.2 - 1.2 mg/dL   Alkaline phosphatase (APISO) 44 40 - 115 U/L   AST 24 10 - 35 U/L   ALT 31 9 - 46 U/L  Lipid panel  Result Value  Ref Range   Cholesterol 107 <200 mg/dL   HDL 39 (L) >40 mg/dL   Triglycerides 173 (H) <150 mg/dL   LDL Cholesterol (Calc) 43 mg/dL (calc)   Total CHOL/HDL Ratio 2.7 <5.0 (calc)   Non-HDL Cholesterol (Calc) 68 <130 mg/dL (calc)  CBC with Differential/Platelet  Result Value Ref Range   WBC  6.2 3.8 - 10.8 Thousand/uL   RBC 4.83 4.20 - 5.80 Million/uL   Hemoglobin 15.2 13.2 - 17.1 g/dL   HCT 44.6 38.5 - 50.0 %   MCV 92.3 80.0 - 100.0 fL   MCH 31.5 27.0 - 33.0 pg   MCHC 34.1 32.0 - 36.0 g/dL   RDW 14.4 11.0 - 15.0 %   Platelets 185 140 - 400 Thousand/uL   MPV 9.2 7.5 - 12.5 fL   Neutro Abs 3,460 1,500 - 7,800 cells/uL   Lymphs Abs 1,823 850 - 3,900 cells/uL   WBC mixed population 583 200 - 950 cells/uL   Eosinophils Absolute 273 15 - 500 cells/uL   Basophils Absolute 62 0 - 200 cells/uL   Neutrophils Relative % 55.8 %   Total Lymphocyte 29.4 %   Monocytes Relative 9.4 %   Eosinophils Relative 4.4 %   Basophils Relative 1.0 %  PSA, Total with Reflex to PSA, Free  Result Value Ref Range   PSA, Total 0.5 < OR = 4.0 ng/mL      Assessment & Plan:   Problem List Items Addressed This Visit    Diabetes mellitus type 2, uncontrolled, without complications ([place]) - Primary    Not due yet for A1c, recently with sub-optimal control DM with A1c 8.6 (unchanged from 8.7). Home CBG readings indicate still elevated overall glucose, limited change No complications or hypoglycemia - Prior Meds: Onglyza (ins/cost), Metformin XR 750 (GI intolerance)  Plan:  1. Continue current regimen with Trulicity 3.7CW weekly inj, Metformin XR 51m daily 2. Discussed may go ahead and try to increase Glipizide dose from 160mdaily to 106mID, caution hypoglycemia, however no symptoms and no low sugars consistently < 120 3. Encourage improved lifestyle - low carb, low sugar diet, reduce portion size, continue improving regular exercise 3. Check CBG, bring log to next visit for review - check 3x weekly 4.  Continue ASA, ACEi, Statin 5. Advised to schedule DM [facility]htho exam, [facility], if persistent elevated A1c or not improved despite inc Glipizide, discussed SGLT2 addition as next step      Relevant Medications   TRULICITY 0.78.88/BV/6.9IHPN   Hyperlipidemia associated with type 2 diabetes mellitus ([place]  Mostly controlled cholesterol on statin and lifestyle Last lipid panel 9/20182 Calculated ASCVD 10 yr risk score elevated  Plan: 1. Continue current meds - Rosuvastatin 77m74mily 2. Continue ASA 81mg86m primary ASCVD risk reduction 3. Encourage improved lifestyle - low carb/cholesterol, reduce portion size, continue improving regular exercise 4. Follow-up lipids q 1 yr      Relevant Medications   TRULICITY 0.75 0.38.UE/2.8MK   Hypertension    Controlled HTN - Home BP readings appropriate  Complication with CAD   Plan:  1. Continue current BP regimen Amlodipine 77mg,31moprolol Succinate XL 50mg d27m, Lisinopril 20mg, F85memide 40mg dai75mRN 2. Encourage improved lifestyle - low sodium diet, improve regular exercise 3. Continue monitor BP outside office, bring readings to next visit, if persistently >140/90 or new symptoms notify office sooner 4. 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 TRULICITY 7.89 FY/1.0FB SOPN  . DISCONTD: metFORMIN (GLUCOPHAGE-XR) 750 MG 24 hr tablet  . amoxicillin-clavulanate (AUGMENTIN) 875-125 MG tablet    Sig: Take 1 tablet by mouth 2 (two) times daily. For 10 days, if not improved in 48 hours    Dispense:  20 tablet    Refill:  0    Follow up plan: Return in about 6 weeks (around 12/10/2016) for DM A1c, keep same apt.  [name], [facility] 10/28/2016, 11:26 PM

## 2016-10-28 NOTE — Patient Instructions (Addendum)
Thank you for coming to the clinic today.  1. Due for annual diabetic eye exam, [facility] - check into status, and make sure that they have my information phone / fax  2. For R ear residual infection and bronchitis, will try Augmentin 1 pill twice daily for 10 days if still not improved by Wednesday  May take 1 more week of Mucinex if need in future  3. Regarding blood sugar, may increase Glipizide to 10mg  TWICE daily with food for now, for next 6 weeks until return  If A1c >8.0 or persistent elevated, we can consider new start Farxiga or Jardiance, oral tablet new med  Let me know when you get your flu shot in hospital  Please schedule a Follow-up Appointment to: Return in about 6 weeks (around 12/10/2016) for DM A1c, keep same apt.  If you have any other questions or concerns, please feel free to call the clinic or send a message through [place]. You may also schedule an earlier appointment if necessary.  Additionally, you may be receiving a survey about your experience at our clinic within a few days to 1 week by e-mail or mail. We value your feedback.  [name], DO [facility]

## 2016-10-28 NOTE — Assessment & Plan Note (Addendum)
Controlled HTN - Home BP readings appropriate  Complication with CAD   Plan:  1. Continue current BP regimen Amlodipine 10mg , Metoprolol Succinate XL 50mg  daily, Lisinopril 20mg , Furosemide 40mg  daily PRN 2. Encourage improved lifestyle - low sodium diet, improve regular exercise 3. Continue monitor BP outside office, bring readings to next visit, if persistently >140/90 or new symptoms notify office sooner 4. Follow-up q 3 mo

## 2016-10-28 NOTE — Assessment & Plan Note (Signed)
Mostly controlled cholesterol on statin and lifestyle Last lipid panel 9/20182 Calculated ASCVD 10 yr risk score elevated  Plan: 1. Continue current meds - Rosuvastatin 10mg  daily 2. Continue ASA 81mg  for primary ASCVD risk reduction 3. Encourage improved lifestyle - low carb/cholesterol, reduce portion size, continue improving regular exercise 4. Follow-up lipids q 1 yr

## 2016-10-28 NOTE — Assessment & Plan Note (Signed)
Not due yet for A1c, recently with sub-optimal control DM with A1c 8.6 (unchanged from 8.7). Home CBG readings indicate still elevated overall glucose, limited change No complications or hypoglycemia - Prior Meds: Onglyza (ins/cost), Metformin XR 750 (GI intolerance)  Plan:  1. Continue current regimen with Trulicity 1.5mg  weekly inj, Metformin XR 500mg  daily 2. Discussed may go ahead and try to increase Glipizide dose from 10mg  daily to 10mg  BID, caution hypoglycemia, however no symptoms and no low sugars consistently < 120 3. Encourage improved lifestyle - low carb, low sugar diet, reduce portion size, continue improving regular exercise 3. Check CBG, bring log to next visit for review - check 3x weekly 4. Continue ASA, ACEi, Statin 5. Advised to schedule DM ophtho exam, send record - [facility] 6. Follow-up 6 wk for DM A1c, if persistent elevated A1c or not improved despite inc Glipizide, discussed SGLT2 addition as next step

## 2016-11-21 ENCOUNTER — Ambulatory Visit (INDEPENDENT_AMBULATORY_CARE_PROVIDER_SITE_OTHER): Payer: Managed Care, Other (non HMO) | Admitting: Family Medicine

## 2016-11-21 ENCOUNTER — Encounter: Payer: Self-pay | Admitting: Family Medicine

## 2016-11-21 ENCOUNTER — Ambulatory Visit
Admission: RE | Admit: 2016-11-21 | Discharge: 2016-11-21 | Disposition: A | Discharge status: Home / Self Care | Payer: Managed Care, Other (non HMO) | Source: Ambulatory Visit | Attending: Family Medicine | Admitting: Family Medicine

## 2016-11-21 VITALS — BP 176/81 | HR 61 | Temp 97.6°F | Resp 16 | Ht 76.0 in | Wt 274.0 lb

## 2016-11-21 DIAGNOSIS — J411 Mucopurulent chronic bronchitis: Secondary | ICD-10-CM

## 2016-11-21 DIAGNOSIS — R05 Cough: Secondary | ICD-10-CM | POA: Diagnosis present

## 2016-11-21 DIAGNOSIS — J329 Chronic sinusitis, unspecified: Secondary | ICD-10-CM

## 2016-11-21 MED ORDER — FLUTICASONE PROPIONATE 50 MCG/ACT NA SUSP: 2.0000 | Freq: Every day | NASAL | 3 refills | Status: DC

## 2016-11-21 MED ORDER — ALBUTEROL SULFATE HFA 108 (90 BASE) MCG/ACT IN AERS: 2.0000 | INHALATION_SPRAY | RESPIRATORY_TRACT | 1 refills | Status: DC | PRN

## 2016-11-21 MED ORDER — LEVOFLOXACIN 500 MG PO TABS: 500.0000 mg | ORAL_TABLET | Freq: Every day | ORAL | 0 refills | Status: DC

## 2016-11-21 MED ORDER — PREDNISONE 50 MG PO TABS: 50.0000 mg | ORAL_TABLET | Freq: Every day | ORAL | 0 refills | Status: DC

## 2016-11-21 MED ORDER — IPRATROPIUM-ALBUTEROL 0.5-2.5 (3) MG/3ML IN SOLN: 3.0000 mL | Freq: Once | RESPIRATORY_TRACT | Status: DC

## 2016-11-21 NOTE — Patient Instructions (Addendum)
Thank you for coming to the clinic today.  1. It sounds like you had an Upper Respiratory Virus that has settled into a Bronchitis, lower respiratory tract infection. I don't have concerns for pneumonia today, and think that this should gradually improve. Once you are feeling better, the cough may take a few weeks to fully resolve. I do hear wheezing and coarse breath sounds, this may be due to the virus  Much improved after nebulizer treatment  Waiting on X-ray results will call  Start Levaquin antibiotic 500mg  daily for 7 days  - Start Prednisone 50mg  daily for next 5 days - this will open up lungs allow you to breath better and treat that wheezing or bronchospasm - Use Albuterol inhaler 2 puffs every 4-6 hours around the clock for next 2-3 days, max up to 5 days then use as needed (ask pharmacist to demonstrate proper inhaler use)  Start Flonase 2 sprays in each nostril daily for next 4-6 weeks, then you may stop and use seasonally or as needed  - Keep loratadine (Claritin) 10mg  daily  - Use nasal saline (Simply Saline or Ocean Spray) to flush nasal congestion multiple times a day, may help cough - Drink plenty of fluids to improve congestion  If your symptoms seem to worsen instead of improve over next several days, including significant fever / chills, worsening shortness of breath, worsening wheezing, or nausea / vomiting and can't take medicines - return sooner or go to hospital Emergency Department for more immediate treatment.  Future we may consider referral to Pulmonology if worsening and need lung testing  Please schedule a Follow-up Appointment to: Return in about 2 weeks (around 12/05/2016) for Bronchitis.  If you have any other questions or concerns, please feel free to call the clinic or send a message through [place]. You may also schedule an earlier appointment if necessary.  Additionally, you may be receiving a survey about your experience at our clinic within a few  days to 1 week by e-mail or mail. We value your feedback.  [name], DO [place]

## 2016-11-21 NOTE — Progress Notes (Signed)
Subjective:    Patient ID: Kevin Perry, male    DOB: May 22, 1950, 65 y.o.   MRN: [id number]  Kevin Perry is a 66 y.o. male presenting on 11/21/2016 for Cough (hasn't stop from last visit, sore throat (no chills or fever or ear pain,  HA)  wheezing)  Patient presents for a same day appointment.  HPI   CHRONIC BRONCHITIS - Last visit with me 10/28/16, for annual physical and also sick visit with same problem, previously had received Azithromycin and steroid injection about 3 weeks prior at [facility] in early September, then with me he was treated with Augmentin mucinex, supportive care, also provided coverage for R AOM, see prior notes for background information. - Interval update with persistent symptoms without any significant relief now for past 3 weeks - Today patient reports persistent productive cough seems worse, some thicker purulent sputum at times by description, some drainage still, R ear with chronic changes but possibly some inc pressure. - Tried repeat round of Mucinex limited relief - He used to be on Flonase, no longer has active rx would like refill - No known sick contacts, works in [place] wheezing - Denies fever/chills, ear pain or discharge, dyspnea, sinus pain, body aches, nausea vomiting, chest pain  Health Maintenance: - UTD Flu Shot 11/06/16 - Due for Prevnar13 in future  Depression screen [facility] 2/9 10/28/2016 09/03/2016 03/09/2015  Decreased Interest 0 0 0  Down, Depressed, Hopeless 0 0 0  PHQ - 2 Score 0 0 0  Altered sleeping - 0 -  Tired, decreased energy - 0 -  Change in appetite - 0 -  Feeling bad or failure about yourself  - 0 -  Trouble concentrating - 0 -  Moving slowly or fidgety/restless - 0 -  Suicidal thoughts - 0 -  PHQ-9 Score - 0 -    Social History  Substance Use Topics  . Smoking status: Former Smoker    Quit date: 08/16/2004  . Smokeless tobacco: Former Systems developer     Comment: quit 20 plus years  . Alcohol use No     Comment: SOCIAL     Review of Systems Per HPI unless specifically indicated above     Objective:    BP (!) 176/81   Pulse 61   Temp 97.6 F (36.4 C) (Oral)   Resp 16   Ht 6\' 4"  (1.93 m)   Wt 274 lb (124.3 kg)   SpO2 96%   BMI 33.35 kg/m   Wt Readings from Last 3 Encounters:  11/21/16 274 lb (124.3 kg)  10/28/16 275 lb (124.7 kg)  09/03/16 275 lb (124.7 kg)    Physical Exam  Constitutional: He is oriented to person, place, and time. He appears well-developed and well-nourished. No distress.  Mildly ill appearing, mostly comfortable, cooperative  HENT:  Head: Normocephalic and atraumatic.  Mouth/Throat: Oropharynx is clear and moist.  Frontal / maxillary sinuses non-tender. Nares patent without purulence or edema.  Similar to last appearance R TM with chronic changes with old scar and perforation, some localized erythema but no obvious purulence.  L TM mostly clear with some effusion mild but no purulence, no erythema.  Oropharynx still with minimal posterior pharyngeal drainage without erythema, exudates, edema or asymmetry.  Eyes: Pupils are equal, round, and reactive to light. Conjunctivae and EOM are normal. Right eye exhibits no discharge. Left eye exhibits no discharge.  Neck: Normal range of motion. Neck supple. No thyromegaly present.  Cardiovascular: Normal rate, regular  rhythm, normal heart sounds and intact distal pulses.   No murmur heard. Pulmonary/Chest: Effort normal. No respiratory distress. He has wheezes (Occasional scattered exp wheeze). He has no rales.  Pre-Nebulizer Treatment Mild increased work of breathing with unable to take full deep breaths with reduced air movement diffusely, wheezing with tight breath sounds due to reduced air movement. No focal crackles but some coarse breath sounds. Occasional cough.  Post-Nebulizer (Duoneb x 1) Treatment Dramatic improvement in air movement, now with deeper breaths and some increased abnormal rhonchi and wheezing sounds  audible, more exp wheezing, some coughing.  Musculoskeletal: He exhibits no edema.  Upper / Lower Extremities: - Normal muscle tone, strength bilateral upper extremities 5/5, lower extremities 5/5  Lymphadenopathy:    He has no cervical adenopathy.  Neurological: He is alert and oriented to person, place, and time.  Distal sensation intact to light touch all extremities  Skin: Skin is warm and dry. No rash noted. He is not diaphoretic. No erythema.  Psychiatric: His behavior is normal.  Nursing note and vitals reviewed.  I have personally reviewed the radiology report from STAT Chest X-ray on 11/21/16.  CLINICAL DATA:  Former smoker.  Cough.  EXAM: CHEST  2 VIEW  COMPARISON:  03/04/2016  FINDINGS: Mild peribronchial thickening and interstitial prominence. Heart and mediastinal contours are within normal limits. No focal opacities or effusions. No acute bony abnormality.  IMPRESSION: Mild bronchitic changes   Electronically Signed   By: Rolm Baptise M.D.   On: 11/21/2016 12:06   Results for orders placed or performed in visit on 10/24/16  COMPLETE METABOLIC PANEL WITH GFR  Result Value Ref Range   Glucose, Bld 195 (H) 65 - 99 mg/dL   BUN 12 7 - 25 mg/dL   Creat 0.86 0.70 - 1.25 mg/dL   GFR, Est Non African American 90 > OR = 60 mL/min/1.96m2   GFR, Est African American 105 > OR = 60 mL/min/1.45m2   BUN/Creatinine Ratio NOT APPLICABLE 6 - 22 (calc)   Sodium 137 135 - 146 mmol/L   Potassium 3.9 3.5 - 5.3 mmol/L   Chloride 102 98 - 110 mmol/L   CO2 29 20 - 32 mmol/L   Calcium 9.1 8.6 - 10.3 mg/dL   Total Protein 6.4 6.1 - 8.1 g/dL   Albumin 4.2 3.6 - 5.1 g/dL   Globulin 2.2 1.9 - 3.7 g/dL (calc)   AG Ratio 1.9 1.0 - 2.5 (calc)   Total Bilirubin 0.7 0.2 - 1.2 mg/dL   Alkaline phosphatase (APISO) 44 40 - 115 U/L   AST 24 10 - 35 U/L   ALT 31 9 - 46 U/L  Lipid panel  Result Value Ref Range   Cholesterol 107 <200 mg/dL   HDL 39 (L) >40 mg/dL   Triglycerides  173 (H) <150 mg/dL   LDL Cholesterol (Calc) 43 mg/dL (calc)   Total CHOL/HDL Ratio 2.7 <5.0 (calc)   Non-HDL Cholesterol (Calc) 68 <130 mg/dL (calc)  CBC with Differential/Platelet  Result Value Ref Range   WBC 6.2 3.8 - 10.8 Thousand/uL   RBC 4.83 4.20 - 5.80 Million/uL   Hemoglobin 15.2 13.2 - 17.1 g/dL   HCT 44.6 38.5 - 50.0 %   MCV 92.3 80.0 - 100.0 fL   MCH 31.5 27.0 - 33.0 pg   MCHC 34.1 32.0 - 36.0 g/dL   RDW 14.4 11.0 - 15.0 %   Platelets 185 140 - 400 Thousand/uL   MPV 9.2 7.5 - 12.5 fL   Neutro  Abs 3,460 1,500 - 7,800 cells/uL   Lymphs Abs 1,823 850 - 3,900 cells/uL   WBC mixed population 583 200 - 950 cells/uL   Eosinophils Absolute 273 15 - 500 cells/uL   Basophils Absolute 62 0 - 200 cells/uL   Neutrophils Relative % 55.8 %   Total Lymphocyte 29.4 %   Monocytes Relative 9.4 %   Eosinophils Relative 4.4 %   Basophils Relative 1.0 %  PSA, Total with Reflex to PSA, Free  Result Value Ref Range   PSA, Total 0.5 < OR = 4.0 ng/mL      Assessment & Plan:   Problem List Items Addressed This Visit    None    Visit Diagnoses    Mucopurulent chronic bronchitis (HCC)    -  Primary   Relevant Medications   ipratropium-albuterol (DUONEB) 0.5-2.5 (3) MG/3ML nebulizer solution 3 mL   albuterol (PROVENTIL HFA;VENTOLIN HFA) 108 (90 Base) MCG/ACT inhaler   levofloxacin (LEVAQUIN) 500 MG tablet   predniSONE (DELTASONE) 50 MG tablet  Consistent with chronic vs recurrent bronchitis now x 6 weeks, with worsening productive purulent cough. - Former smoker, but no formal dx COPD - No hypoxia (96% on RA), afebrile, no recent hospitalization - Failed Azithromycin steroid injection and augmentin among other supportive care  Plan: 1. STAT CXR today - results above show mild bronchitic change, no infiltrate or evidence of COPD, sent results to patient after visit 2. Duoneb x 1 treatment in office, tolerated well with dramatic improvement 3. Agree to treat empirically as if possible  COPD exac vs bronchospasm from chronic bronchitis - Start Prednisone 50mg  x 5 day steroid burst - reviewed caution with glucose - Start Levaquin 500mg  daily x 7 days - New rx albuterol inhaler q 4 hr regularly x 2-3 days - reviewed proper use, then PRN 4. For sinus drainage likely contributing to chronic cough - start nasal steroid Flonase 2 sprays in each nostril daily for 4-6 weeks, may repeat course seasonally or as needed 5. Follow-up within 1-2 weeks if not improved, consider future referral to Pulm and PFTs, otherwise return criteria given if significant worsening  Likely elevated BP due to acute illness and coughing Recommend re-check, declined offer and he will get at work and monitor    Other Relevant Orders   DG Chest 2 View (Completed)   Chronic sinusitis, unspecified location       Relevant Medications   fluticasone (FLONASE) 50 MCG/ACT nasal spray   levofloxacin (LEVAQUIN) 500 MG tablet   predniSONE (DELTASONE) 50 MG tablet      Meds ordered this encounter  Medications  . fluticasone (FLONASE) 50 MCG/ACT nasal spray    Sig: Place 2 sprays into both nostrils daily. Use for 4-6 weeks then stop and use seasonally or as needed.    Dispense:  16 g    Refill:  3  . ipratropium-albuterol (DUONEB) 0.5-2.5 (3) MG/3ML nebulizer solution 3 mL  . albuterol (PROVENTIL HFA;VENTOLIN HFA) 108 (90 Base) MCG/ACT inhaler    Sig: Inhale 2 puffs into the lungs every 4 (four) hours as needed for wheezing or shortness of breath (cough).    Dispense:  1 Inhaler    Refill:  1  . levofloxacin (LEVAQUIN) 500 MG tablet    Sig: Take 1 tablet (500 mg total) by mouth daily. For 7 days    Dispense:  7 tablet    Refill:  0  . predniSONE (DELTASONE) 50 MG tablet    Sig: Take 1 tablet (50  mg total) by mouth daily with breakfast.    Dispense:  5 tablet    Refill:  0   Follow up plan: Return in about 2 weeks (around 12/05/2016) for Bronchitis.  [name], [facility] 11/21/2016, 1:53 PM

## 2016-12-10 ENCOUNTER — Encounter: Payer: Self-pay | Admitting: Family Medicine

## 2016-12-10 ENCOUNTER — Ambulatory Visit (INDEPENDENT_AMBULATORY_CARE_PROVIDER_SITE_OTHER): Payer: Managed Care, Other (non HMO) | Admitting: Family Medicine

## 2016-12-10 VITALS — BP 130/80 | HR 82 | Temp 98.2°F | Resp 16 | Ht 76.0 in | Wt 277.0 lb

## 2016-12-10 DIAGNOSIS — I10 Essential (primary) hypertension: Secondary | ICD-10-CM | POA: Diagnosis not present

## 2016-12-10 DIAGNOSIS — IMO0001 Reserved for inherently not codable concepts without codable children: Secondary | ICD-10-CM

## 2016-12-10 DIAGNOSIS — E1165 Type 2 diabetes mellitus with hyperglycemia: Secondary | ICD-10-CM

## 2016-12-10 DIAGNOSIS — Z23 Encounter for immunization: Secondary | ICD-10-CM | POA: Diagnosis not present

## 2016-12-10 NOTE — Patient Instructions (Addendum)
Thank you for coming to the clinic today.  1. A1c on fingerstick was 11.4 we will not count this one, most likely it is an error, we will check with arm lab draw  Check with pharmacy about the following SGLT2, oral tablets, extra sugar leaves body through urine, need to hydrate more to keep up with fluid loss.  1. Farxiga  2. Jardiance  3. Invokana  ------------------  Call insurance find cost and coverage of the following  1. Ozempic (Semaglutide injection) - start 0.25mg  weekly for 4 weeks then increase to 0.5mg  weekly - This one has best benefit of weight loss and reducing Cardiovascular events  DUE for NON FASTING BLOOD WORK  A1c only  SCHEDULE "Lab Only" visit in the morning at the clinic for lab draw in 2-3 days  - Make sure Lab Only appointment is at about 1 week before your next appointment, so that results will be available  For Lab Results, once available within 2-3 days of blood draw, you can can log in to MyChart online to view your results and a brief explanation. Also, we can discuss results at next follow-up visit.  Please schedule a Follow-up Appointment to: Return in about 3 months (around 03/12/2017) for DM A1c.  If you have any other questions or concerns, please feel free to call the clinic or send a message through [place]. You may also schedule an earlier appointment if necessary.  Additionally, you may be receiving a survey about your experience at our clinic within a few days to 1 week by e-mail or mail. We value your feedback.  [name], DO [place]

## 2016-12-10 NOTE — Progress Notes (Signed)
Subjective:    Patient ID: Kevin Perry, male    DOB: 01/11/1951, 66 y.o.   MRN: [id number]  Kevin Perry is a 66 y.o. male presenting on 12/10/2016 for Diabetes   HPI   CHRONIC DM, Type 2: - Last visit with me 10/28/16, for same problem DM, treated without new change continued high dose Trulicity 1.5mg  [place] weekly, Metformin XR 500mg  daily, see prior notes for background information. - Interval update with still elevated sugar readings by report, seems AM is higher - Today patient reports concern sugar not improved - CBGs: Avg AM reading 180s, PM reading before supper 130 (but < 989) Meds: Trulicity 1.5mg  weekly, Glipizide 10mg  daily, Metformin XR 500mg  daily Reports good compliance. 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  CHRONIC HTN: Reports checking BP at work, and normal readings, had elevated reading last time in 10/2016 with bronchitis Current Meds - Amlodipine 10mg  daily, Lisinopril 20mg  daily, Metoprolol XL 50mg  daily   Reports good compliance, took meds today. Tolerating well, w/o complaints.  Health Maintenance: - Due for initial pneumonia vaccine at age 39 - will receive Prevnar-13 today - UTD Flu Vaccine  Depression screen [facility] 2/9 12/11/2016 10/28/2016 09/03/2016  Decreased Interest 0 0 0  Down, Depressed, Hopeless 0 0 0  PHQ - 2 Score 0 0 0  Altered sleeping 0 - 0  Tired, decreased energy 0 - 0  Change in appetite 0 - 0  Feeling bad or failure about yourself  0 - 0  Trouble concentrating 0 - 0  Moving slowly or fidgety/restless 0 - 0  Suicidal thoughts 0 - 0  PHQ-9  Score 0 - 0  Difficult doing work/chores Not difficult at all - -    Social History   Tobacco Use  . Smoking status: Former Smoker    Last attempt to quit: 08/16/2004    Years since quitting: 12.3  . Smokeless tobacco: Former Systems developer  . Tobacco comment: quit 20 plus years  Substance Use Topics  . Alcohol use: No    Comment: SOCIAL  . Drug use: No    Review of Systems Per HPI unless specifically indicated above     Objective:    BP 130/80 (BP Location: Left Arm, Cuff Size: Normal)   Pulse 82   Temp 98.2 F (36.8 C) (Oral)   Resp 16   Ht 6\' 4"  (1.93 m)   Wt 277 lb (125.6 kg)   BMI 33.72 kg/m   Wt Readings from Last 3 Encounters:  12/10/16 277 lb (125.6 kg)  11/21/16 274 lb (124.3 kg)  10/28/16 275 lb (124.7 kg)    Physical Exam  Constitutional: He is oriented to person, place, and time. He appears well-developed and well-nourished. No distress.  Well-appearing, comfortable, cooperative  HENT:  Head: Normocephalic and atraumatic.  Mouth/Throat: Oropharynx is clear and moist.  Eyes: Conjunctivae are normal. Right eye exhibits no discharge. Left eye exhibits no discharge.  Cardiovascular: Normal rate.  Pulmonary/Chest: Effort normal.  Musculoskeletal: He exhibits no edema.  Neurological: He is alert and oriented to person, place, and time.  Skin: Skin is warm and dry. No rash noted. He is not  diaphoretic. No erythema.  Psychiatric: He has a normal mood and affect. His behavior is normal.  Well groomed, good eye contact, normal speech and thoughts  Nursing note and vitals reviewed.    Recent Labs    03/05/16 1652 09/03/16 2108  HGBA1C 8.7 8.6*    Results for orders placed or performed in visit on 10/24/16  COMPLETE METABOLIC PANEL WITH GFR  Result Value Ref Range   Glucose, Bld 195 (H) 65 - 99 mg/dL   BUN 12 7 - 25 mg/dL   Creat 0.86 0.70 - 1.25 mg/dL   GFR, Est Non African American 90 > OR = 60 mL/min/1.7m2   GFR, Est African American 105 > OR = 60  mL/min/1.3m2   BUN/Creatinine Ratio NOT APPLICABLE 6 - 22 (calc)   Sodium 137 135 - 146 mmol/L   Potassium 3.9 3.5 - 5.3 mmol/L   Chloride 102 98 - 110 mmol/L   CO2 29 20 - 32 mmol/L   Calcium 9.1 8.6 - 10.3 mg/dL   Total Protein 6.4 6.1 - 8.1 g/dL   Albumin 4.2 3.6 - 5.1 g/dL   Globulin 2.2 1.9 - 3.7 g/dL (calc)   AG Ratio 1.9 1.0 - 2.5 (calc)   Total Bilirubin 0.7 0.2 - 1.2 mg/dL   Alkaline phosphatase (APISO) 44 40 - 115 U/L   AST 24 10 - 35 U/L   ALT 31 9 - 46 U/L  Lipid panel  Result Value Ref Range   Cholesterol 107 <200 mg/dL   HDL 39 (L) >40 mg/dL   Triglycerides 173 (H) <150 mg/dL   LDL Cholesterol (Calc) 43 mg/dL (calc)   Total CHOL/HDL Ratio 2.7 <5.0 (calc)   Non-HDL Cholesterol (Calc) 68 <130 mg/dL (calc)  CBC with Differential/Platelet  Result Value Ref Range   WBC 6.2 3.8 - 10.8 Thousand/uL   RBC 4.83 4.20 - 5.80 Million/uL   Hemoglobin 15.2 13.2 - 17.1 g/dL   HCT 44.6 38.5 - 50.0 %   MCV 92.3 80.0 - 100.0 fL   MCH 31.5 27.0 - 33.0 pg   MCHC 34.1 32.0 - 36.0 g/dL   RDW 14.4 11.0 - 15.0 %   Platelets 185 140 - 400 Thousand/uL   MPV 9.2 7.5 - 12.5 fL   Neutro Abs 3,460 1,500 - 7,800 cells/uL   Lymphs Abs 1,823 850 - 3,900 cells/uL   WBC mixed population 583 200 - 950 cells/uL   Eosinophils Absolute 273 15 - 500 cells/uL   Basophils Absolute 62 0 - 200 cells/uL   Neutrophils Relative % 55.8 %   Total Lymphocyte 29.4 %   Monocytes Relative 9.4 %   Eosinophils Relative 4.4 %   Basophils Relative 1.0 %  PSA, Total with Reflex to PSA, Free  Result Value Ref Range   PSA, Total 0.5 < OR = 4.0 ng/mL      Assessment & Plan:   Problem List Items Addressed This Visit    Diabetes mellitus type 2, uncontrolled, without complications ([place]) - Primary    Concern with dramatically elevated A1c from 8.6 up to 11.4 on POC A1c, suspect this is inaccurate given home readings are not consistent with avg >295 CBG No complications or hypoglycemia - Prior Meds: Onglyza  (ins/cost), Metformin XR 750 (GI intolerance)  Plan:  1. Discussion on possible false reading on POC test / DM med adjustments - Agree to repeat A1c for lab test this week, for comparison and more accuracy - IF A1c truly elevated >10 then concern that  current med regimen ineffective, possible to have limited effectiveness of current GLP1 Trulicity, may need to switch to alternative agent, discussed other GLP1 such as Ozempic, and for him to check cost/coverage, ultimately we may need to complete PA to switch if need - Also discuss options for adding SGLT2 to med list, given handout / list check with cost/coverage. Would likely DC Glimepiride sulfonylurea if start SGLT2 - For now continue current regimen with Trulicity 1.5mg  weekly inj, Metformin XR 500mg  daily 2. Encourage improved lifestyle - low carb, low sugar diet, reduce portion size, goal for regular balanced 3 x daily meal instead of skipping lunch, continue improving regular exercise 3. Check CBG, bring log to next visit for review - check 3x weekly 4. Continue ASA, ACEi, Statin 5. Advised to schedule DM ophtho exam, send record - [facility] 6. Follow-up 3 mo for DM A1c - if still uncontrolled next step may be Endocrinology referral for other assistance      Relevant Orders   Hemoglobin A1c   Hypertension    Improved manual re-check Continue monitor at work No change, continue meds       Other Visit Diagnoses    Need for vaccination with 13-polyvalent pneumococcal conjugate vaccine       Relevant Orders   Pneumococcal conjugate vaccine 13-valent IM (Completed)      No orders of the defined types were placed in this encounter.   Follow up plan: Return in about 3 months (around 03/12/2017) for DM A1c.  A total of 25 minutes was spent face-to-face with this patient. Greater than 50% of this time was spent in counseling on diabetes medication management and complications.  [name], [facility] 12/11/2016, 12:44 AM

## 2016-12-11 ENCOUNTER — Encounter: Payer: Self-pay | Admitting: Family Medicine

## 2016-12-11 NOTE — Assessment & Plan Note (Signed)
Concern with dramatically elevated A1c from 8.6 up to 11.4 on POC A1c, suspect this is inaccurate given home readings are not consistent with avg >563 CBG No complications or hypoglycemia - Prior Meds: Onglyza (ins/cost), Metformin XR 750 (GI intolerance)  Plan:  1. Discussion on possible false reading on POC test / DM med adjustments - Agree to repeat A1c for lab test this week, for comparison and more accuracy - IF A1c truly elevated >10 then concern that current med regimen ineffective, possible to have limited effectiveness of current GLP1 Trulicity, may need to switch to alternative agent, discussed other GLP1 such as Ozempic, and for him to check cost/coverage, ultimately we may need to complete PA to switch if need - Also discuss options for adding SGLT2 to med list, given handout / list check with cost/coverage. Would likely DC Glimepiride sulfonylurea if start SGLT2 - For now continue current regimen with Trulicity 1.5mg  weekly inj, Metformin XR 500mg  daily 2. Encourage improved lifestyle - low carb, low sugar diet, reduce portion size, goal for regular balanced 3 x daily meal instead of skipping lunch, continue improving regular exercise 3. Check CBG, bring log to next visit for review - check 3x weekly 4. Continue ASA, ACEi, Statin 5. Advised to schedule DM ophtho exam, send record - [facility] 6. Follow-up 3 mo for DM A1c - if still uncontrolled next step may be Endocrinology referral for other assistance

## 2016-12-11 NOTE — Assessment & Plan Note (Signed)
Improved manual re-check Continue monitor at work No change, continue meds

## 2016-12-12 ENCOUNTER — Other Ambulatory Visit: Payer: Managed Care, Other (non HMO)

## 2016-12-12 DIAGNOSIS — IMO0001 Reserved for inherently not codable concepts without codable children: Secondary | ICD-10-CM

## 2016-12-12 DIAGNOSIS — E1165 Type 2 diabetes mellitus with hyperglycemia: Principal | ICD-10-CM

## 2016-12-13 LAB — HEMOGLOBIN A1C
Hgb A1c MFr Bld: 7.6 % of total Hgb — ABNORMAL HIGH (ref <5.7)
Mean Plasma Glucose: 171 (calc)
eAG (mmol/L): 9.5 (calc)

## 2017-01-25 ENCOUNTER — Other Ambulatory Visit: Payer: Self-pay | Admitting: Family Medicine

## 2017-01-25 DIAGNOSIS — I10 Essential (primary) hypertension: Secondary | ICD-10-CM

## 2017-03-07 ENCOUNTER — Other Ambulatory Visit: Payer: Self-pay | Admitting: Family Medicine

## 2017-03-07 DIAGNOSIS — I10 Essential (primary) hypertension: Secondary | ICD-10-CM

## 2017-03-07 DIAGNOSIS — I2510 Atherosclerotic heart disease of native coronary artery without angina pectoris: Secondary | ICD-10-CM

## 2017-03-07 DIAGNOSIS — IMO0001 Reserved for inherently not codable concepts without codable children: Secondary | ICD-10-CM

## 2017-03-07 DIAGNOSIS — E1165 Type 2 diabetes mellitus with hyperglycemia: Secondary | ICD-10-CM

## 2017-03-12 ENCOUNTER — Encounter: Payer: Self-pay | Admitting: Family Medicine

## 2017-03-12 ENCOUNTER — Ambulatory Visit (INDEPENDENT_AMBULATORY_CARE_PROVIDER_SITE_OTHER): Payer: Managed Care, Other (non HMO) | Admitting: Family Medicine

## 2017-03-12 DIAGNOSIS — IMO0001 Reserved for inherently not codable concepts without codable children: Secondary | ICD-10-CM

## 2017-03-12 DIAGNOSIS — E1165 Type 2 diabetes mellitus with hyperglycemia: Secondary | ICD-10-CM | POA: Diagnosis not present

## 2017-03-12 NOTE — Assessment & Plan Note (Signed)
Stable DM2 control based on home CBGs, improved. No A1c today, due to patient is early for lab, < 3 months deferred for now No known complications or hypoglycemia - Prior Meds: Onglyza (ins/cost), Metformin XR 750 (GI intolerance)  Plan:  1. CONTINUE current meds - Trulicity 1.5mg  weekly [place] inj, Metformin XR 500mg  daily, GLipizide 10mg  daily - Counseling on risk hypoglycemia on sulfonylurea 2. Encourage improved lifestyle - low carb, low sugar diet, reduce portion size, goal for regular balanced 3 x daily meal instead of skipping lunch, continue improving regular exercise 3. Check CBG, bring log to next visit for review - check 3x weekly 4. Continue ASA, ACEi, Statin 5. Advised need DM ophtho send record - [facility]  6. Follow-up 3 mo for DM - will check serum lab A1c - consider future switch GLP to Ozempic if needed better control, consider SGLT2 if need, goal to avoid sulfonylurea

## 2017-03-12 NOTE — Patient Instructions (Addendum)
Thank you for coming to the office today.  1. Keep on track with current medicines  Continue monitor sugar  No A1c check today  We can consider Ozempic if needed A1c >7.5 or 8 in future  DUE for FASTING BLOOD WORK (no food or drink after midnight before the lab appointment, only water or coffee without cream/sugar on the morning of)  SCHEDULE "Lab Only" visit in the morning at the clinic for lab draw in 3 MONTHS   - Make sure Lab Only appointment is at about 1 week before your next appointment, so that results will be available  For Lab Results, once available within 2-3 days of blood draw, you can can log in to MyChart online to view your results and a brief explanation. Also, we can discuss results at next follow-up visit.  Please schedule a Follow-up Appointment to: Return in about 3 months (around 06/09/2017) for DM lab review, med adjust.    If you have any other questions or concerns, please feel free to call the office or send a message through [place]. You may also schedule an earlier appointment if necessary.  Additionally, you may be receiving a survey about your experience at our office within a few days to 1 week by e-mail or mail. We value your feedback.  [name], DO [place]

## 2017-03-12 NOTE — Progress Notes (Signed)
Subjective:    Patient ID: Kevin Perry, male    DOB: 05-24-1950, 67 y.o.   MRN: [id number]  Kevin Perry is a 67 y.o. male presenting on 03/12/2017 for Diabetes   HPI   CHRONIC DM, Type 2: Last visit 12/10/16, see note for background, he was continued on same med regimen, after had POC A1c error up to >10 on reading then more accurate 7.6 on lab serum draw. - Today he reports he feels sugars same or better. He is a few days early for next A1c and requests to defer it today - CBGs: Avg AM reading 160s, PM reading before supper 130 (but < 150) - still similar, as he does not eat as much in later afternoon, some PM snack peanut butter Meds: Trulicity1.5mg  weekly, Glipizide 10mg  daily, Metformin XR 500mg  daily - He checked with ins they will cover Ozempic if needed Reports good compliance. Tolerating well w/o side-effects Currently on ACEi Lifestyle: - Diet (eats breakfast 9-10am, skips lunch has small snack peanut butter crackers 1pm, 6pm dinner largest meal of day, gradually improving DM diet, still trying to improve diet - he went on cruise recently) - Exercise (walkregularlyat hospitalwhile working) - Last ophtho eye exam reportedly 02/2017 by [facility] - we have not received fax record of DM eye exam from them. He will return for laser treatment for "freckle" on back of eye Denies hypoglycemia, polyuria, visual changes, numbness or tingling.  Depression screen [facility] 2/9 03/12/2017 12/11/2016 10/28/2016  Decreased Interest 0 0 0  Down, Depressed, Hopeless 0 0 0  PHQ - 2 Score 0 0 0  Altered sleeping 0 0 -  Tired, decreased energy 0 0 -  Change in appetite 0 0 -  Feeling bad or failure about yourself  0 0 -  Trouble concentrating 0 0 -  Moving slowly or fidgety/restless 0 0 -  Suicidal thoughts 0 0 -  PHQ-9 Score 0 0 -  Difficult doing work/chores Not difficult at all Not difficult at all -    Social History   Tobacco Use  . Smoking status: Former Smoker    Last attempt to quit: 08/16/2004    Years since quitting: 12.5  . Smokeless tobacco: Former Systems developer  . Tobacco comment: quit 20 plus years  Substance Use Topics  . Alcohol use: No    Comment: SOCIAL  . Drug use: No    Review of Systems Per HPI unless specifically indicated above     Objective:    BP 128/70 (BP Location: Left Arm, Cuff Size: Normal)   Pulse 61   Temp 98.4 F (36.9 C) (Oral)   Resp 16   Ht 6\' 4"  (1.93 m)   Wt 278 lb 12.8 oz (126.5 kg)   BMI 33.94 kg/m   Wt Readings from Last 3 Encounters:  03/12/17 278 lb 12.8 oz (126.5 kg)  12/10/16 277 lb (125.6 kg)  11/21/16 274 lb (124.3 kg)    Physical Exam  Constitutional: He is oriented to person, place, and time. He appears well-developed and well-nourished. No distress.  Well-appearing, comfortable, cooperative  HENT:  Head: Normocephalic and atraumatic.  Mouth/Throat: Oropharynx is clear and moist.  Eyes: Conjunctivae are normal. Right eye exhibits no discharge. Left eye exhibits no discharge.  Neck: Normal range of motion. Neck supple.  Cardiovascular: Normal rate, regular rhythm, normal heart sounds and intact distal pulses.  No murmur heard. Pulmonary/Chest: Effort normal.  Musculoskeletal: Normal range of motion. He exhibits no edema.  Neurological: He is alert and oriented to person, place, and time.  Skin: Skin is warm and dry. No rash noted. He is not diaphoretic. No erythema.  Psychiatric: He has a normal mood and affect. His behavior is normal.  Well groomed, good eye contact, normal speech and thoughts  Nursing note and vitals reviewed.    Recent Labs    09/03/16 2108 12/12/16 0920  HGBA1C 8.6* 7.6*    Results for orders placed or performed in visit on 12/12/16  Hemoglobin A1c  Result Value Ref Range   Hgb A1c MFr Bld 7.6 (H) <5.7 % of total Hgb   Mean Plasma Glucose 171 (calc)   eAG (mmol/L) 9.5 (calc)      Assessment & Plan:   Problem List Items Addressed This Visit    Diabetes  mellitus type 2, uncontrolled, without complications ([place])    Stable DM2 control based on home CBGs, improved. No A1c today, due to patient is early for lab, < 3 months deferred for now No known complications or hypoglycemia - Prior Meds: Onglyza (ins/cost), Metformin XR 750 (GI intolerance)  Plan:  1. CONTINUE current meds - Trulicity 1.5mg  weekly [place] inj, Metformin XR 500mg  daily, GLipizide 10mg  daily - Counseling on risk hypoglycemia on sulfonylurea 2. Encourage improved lifestyle - low carb, low sugar diet, reduce portion size, goal for regular balanced 3 x daily meal instead of skipping lunch, continue improving regular exercise 3. Check CBG, bring log to next visit for review - check 3x weekly 4. Continue ASA, ACEi, Statin 5. Advised need DM ophtho send record - [facility]  6. Follow-up 3 mo for DM - will check serum lab A1c - consider future switch GLP to Ozempic if needed better control, consider SGLT2 if need, goal to avoid sulfonylurea      Relevant Orders   Hemoglobin A1c      No orders of the defined types were placed in this encounter.     Follow up plan: Return in about 3 months (around 06/09/2017) for DM lab review, med adjust.  Future A1c ordered.  [name], [facility] 03/12/2017, 1:50 PM

## 2017-03-20 ENCOUNTER — Telehealth: Payer: Self-pay

## 2017-03-20 ENCOUNTER — Other Ambulatory Visit: Payer: Self-pay | Admitting: Family Medicine

## 2017-03-20 DIAGNOSIS — F329 Major depressive disorder, single episode, unspecified: Secondary | ICD-10-CM

## 2017-03-20 DIAGNOSIS — E1169 Type 2 diabetes mellitus with other specified complication: Secondary | ICD-10-CM

## 2017-03-20 DIAGNOSIS — R2243 Localized swelling, mass and lump, lower limb, bilateral: Secondary | ICD-10-CM

## 2017-03-20 DIAGNOSIS — F32A Depression, unspecified: Secondary | ICD-10-CM

## 2017-03-20 DIAGNOSIS — E785 Hyperlipidemia, unspecified: Secondary | ICD-10-CM

## 2017-03-20 MED ORDER — ESCITALOPRAM OXALATE 20 MG PO TABS: 20.0000 mg | ORAL_TABLET | Freq: Every day | ORAL | 3 refills | Status: DC

## 2017-03-20 NOTE — Telephone Encounter (Signed)
Patient is requesting a refill on Lexapro 20 mg to [facility].

## 2017-03-20 NOTE — Telephone Encounter (Signed)
Rx send

## 2017-03-20 NOTE — Telephone Encounter (Signed)
Left detail message. 

## 2017-04-01 ENCOUNTER — Telehealth: Payer: Self-pay | Admitting: Family Medicine

## 2017-04-01 DIAGNOSIS — Z20828 Contact with and (suspected) exposure to other viral communicable diseases: Secondary | ICD-10-CM

## 2017-04-01 MED ORDER — OSELTAMIVIR PHOSPHATE 75 MG PO CAPS: 75.0000 mg | ORAL_CAPSULE | Freq: Every day | ORAL | 0 refills | Status: DC

## 2017-04-01 NOTE — Telephone Encounter (Signed)
Kevin Perry Kevin Perry have called requesting Kevin Perry flu called into  [facility]

## 2017-04-01 NOTE — Telephone Encounter (Signed)
Sent rx Tamiflu prophylaxis dose flu exposure may increase as instructed if symptoms  [name], DO [facility] 04/01/2017, 6:14 PM

## 2017-04-03 ENCOUNTER — Telehealth: Payer: Self-pay

## 2017-04-03 NOTE — Telephone Encounter (Signed)
error 

## 2017-04-24 ENCOUNTER — Encounter: Payer: Self-pay | Admitting: Nurse Practitioner

## 2017-04-24 ENCOUNTER — Other Ambulatory Visit: Payer: Self-pay

## 2017-04-24 ENCOUNTER — Ambulatory Visit: Payer: Managed Care, Other (non HMO) | Admitting: Nurse Practitioner

## 2017-04-24 VITALS — BP 154/85 | HR 66 | Temp 98.3°F | Ht 76.0 in | Wt 275.0 lb

## 2017-04-24 DIAGNOSIS — B9789 Other viral agents as the cause of diseases classified elsewhere: Secondary | ICD-10-CM

## 2017-04-24 DIAGNOSIS — J029 Acute pharyngitis, unspecified: Secondary | ICD-10-CM | POA: Diagnosis not present

## 2017-04-24 DIAGNOSIS — J028 Acute pharyngitis due to other specified organisms: Principal | ICD-10-CM

## 2017-04-24 DIAGNOSIS — J00 Acute nasopharyngitis [common cold]: Secondary | ICD-10-CM

## 2017-04-24 LAB — POCT RAPID STREP A (OFFICE): Rapid Strep A Screen: NEGATIVE

## 2017-04-24 MED ORDER — IPRATROPIUM BROMIDE 0.06 % NA SOLN: 2.0000 | Freq: Four times a day (QID) | NASAL | 0 refills | Status: DC

## 2017-04-24 NOTE — Progress Notes (Signed)
Strep test negative.

## 2017-04-24 NOTE — Progress Notes (Signed)
Subjective:    Patient ID: Kevin Perry, male    DOB: [birth year], 67 y.o.   MRN: [id number]  Kevin Perry is a 67 y.o. male presenting on 04/24/2017 for Sore Throat (scratchy throat since Monday, getting worse. Taking Mucinex)   HPI Sore Throat Onset of symptom was Monday with scratch and tickle in back of throat.  By Tuesday morning had sore throat which has continued through today.   -Associated symptoms include dry hacking cough, sinus pressure, nasal congestion, rhinorrhea. -Denies malaise and fatigue, ear pressure, ear pain, ear fullness.  Also denies fever, chills, sweats, nausea, vomiting, diarrhea and constipation.  -He has taken Mucinex since Tuesday is not helping change symptoms. -Has had sick contacts this week with family and hospital.  Social History   Tobacco Use  . Smoking status: Former Smoker    Last attempt to quit: 08/16/2004    Years since quitting: 12.6  . Smokeless tobacco: Former Systems developer  . Tobacco comment: quit 20 plus years  Substance Use Topics  . Alcohol use: No    Comment: SOCIAL  . Drug use: No    Review of Systems Per HPI unless specifically indicated above     Objective:    BP (!) 154/85 (BP Location: Right Arm, Patient Position: Sitting, Cuff Size: Normal)   Pulse 66   Temp 98.3 F (36.8 C)   Ht 6\' 4"  (1.93 m)   Wt 275 lb (124.7 kg)   BMI 33.47 kg/m   Wt Readings from Last 3 Encounters:  04/24/17 275 lb (124.7 kg)  03/12/17 278 lb 12.8 oz (126.5 kg)  12/10/16 277 lb (125.6 kg)    Physical Exam  Constitutional: He appears well-developed and well-nourished. He appears distressed (mildly).  HENT:  Head: Normocephalic and atraumatic.  Right Ear: Hearing, tympanic membrane, external ear and ear canal normal.  Left Ear: Hearing, tympanic membrane, external ear and ear canal normal.  Nose: Mucosal edema and rhinorrhea present. Right sinus exhibits frontal sinus tenderness. Right sinus exhibits no maxillary sinus tenderness. Left  sinus exhibits frontal sinus tenderness. Left sinus exhibits no maxillary sinus tenderness.  Mouth/Throat: Uvula is midline and mucous membranes are normal. Posterior oropharyngeal edema (cobblestoning) present. Oropharyngeal exudate: clear secretions.  Neck: Normal range of motion. Neck supple.  Cardiovascular: Normal rate, regular rhythm, S1 normal, S2 normal, normal heart sounds and intact distal pulses.  Pulmonary/Chest: Effort normal and breath sounds normal. No respiratory distress.  Lymphadenopathy:    He has cervical adenopathy.  Neurological: He is alert.  Skin: Skin is warm and dry.  Psychiatric: He has a normal mood and affect. His behavior is normal.  Vitals reviewed.    Results for orders placed or performed in visit on 12/12/16  Hemoglobin A1c  Result Value Ref Range   Hgb A1c MFr Bld 7.6 (H) <5.7 % of total Hgb   Mean Plasma Glucose 171 (calc)   eAG (mmol/L) 9.5 (calc)      Assessment & Plan:   Problem List Items Addressed This Visit    None    Visit Diagnoses    Sore throat (viral)    -  Primary   Relevant Orders   POCT rapid strep A   Nasopharyngitis acute       Relevant Medications   ipratropium (ATROVENT) 0.06 % nasal spray    Acute illness. Fever responsive to NSAIDs and tylenol.  Symptoms not worsening. Consistent with viral illness x 4 days with multiple known sick contacts and no identifiable  focal infections of ears, nose, throat.  Cannot exclude exacerbation of allergic rhinitis.  Plan: 1. Reassurance, likely self-limited with cough lasting up to few weeks - Start Atrovent nasal spray decongestant 2 sprays each nostril up to 4 times daily for 5-7 days - Continue anti-histamine Loratadine 10mg  daily,  - also can use Flonase 2 sprays each nostril daily for up to 4-6 weeks - Start Mucinex-DM OTC up to 7-10 days then stop - Considered tessalon perles but patient refused. 2. Supportive care with nasal saline, warm herbal tea with honey, 3. Improve  hydration 4. Tylenol / Motrin PRN fevers 5. Return criteria given    Meds ordered this encounter  Medications  . ipratropium (ATROVENT) 0.06 % nasal spray    Sig: Place 2 sprays into both nostrils 4 (four) times daily.    Dispense:  15 mL    Refill:  0    Order Specific Question:   Supervising Provider    Answer:   [name] [[id number]]    Follow up plan: Return 6 days if symptoms worsen or fail to improve.  [name], DNP, AGPCNP-BC Adult Gerontology Primary Care Nurse Practitioner [facility] 04/24/2017, 3:34 PM

## 2017-04-24 NOTE — Patient Instructions (Addendum)
Kevin Perry,   Thank you for coming in to clinic today.  1. It sounds like you have a Upper Respiratory Virus - this will most likely run it's course in 7 to 10 days. Recommend good hand washing. - Start Atrovent nasal spray decongestant 2 sprays each nostril up to 4 times daily for 5-7 days - Continue anti-histamine loratadine 10mg  daily,also can use Flonase 2 sprays each nostril daily for up to 4-6 weeks - If congestion is worse, start OTC Mucinex (or may try Mucinex-DM for cough) up to 7-10 days then stop - Drink plenty of fluids to improve congestion - You may try over the counter Nasal Saline spray (Simply Saline, Ocean Spray) as needed to reduce congestion. - Drink warm herbal tea with honey for sore throat. - Start taking Tylenol extra strength 1 to 2 tablets every 6-8 hours for aches or fever/chills for next few days as needed.  Do not take more than 3,000 mg in 24 hours from all medicines.  May take Ibuprofen as well if tolerated 200-400mg  every 8 hours as needed.  If symptoms significantly worsening with persistent fevers/chills despite tylenol/ibpurofen, nausea, vomiting unable to tolerate food/fluids or medicine, body aches, or shortness of breath, sinus pain pressure or worsening productive cough, then follow-up for re-evaluation, may seek more immediate care at Urgent Care or ED if more concerned for emergency.  Please schedule a follow-up appointment with [name], AGNP. Return 6 days if symptoms worsen or fail to improve.  Call clinic.  We will consider antibiotics.  If you have any other questions or concerns, please feel free to call the clinic or send a message through [place]. You may also schedule an earlier appointment if necessary.  You will receive a survey after today's visit either digitally by e-mail or paper by [employer]. Your experiences and feedback matter to [place].  Please respond so we know how we are doing as we provide care for you.   [name],  DNP, AGNP-BC Adult Gerontology Nurse Practitioner [place]

## 2017-05-05 ENCOUNTER — Encounter: Payer: Self-pay | Admitting: Podiatry

## 2017-05-05 ENCOUNTER — Ambulatory Visit: Payer: Managed Care, Other (non HMO) | Admitting: Podiatry

## 2017-05-05 ENCOUNTER — Ambulatory Visit (INDEPENDENT_AMBULATORY_CARE_PROVIDER_SITE_OTHER): Payer: Managed Care, Other (non HMO)

## 2017-05-05 DIAGNOSIS — M21621 Bunionette of right foot: Secondary | ICD-10-CM

## 2017-05-05 DIAGNOSIS — M2011 Hallux valgus (acquired), right foot: Secondary | ICD-10-CM

## 2017-05-05 DIAGNOSIS — M2041 Other hammer toe(s) (acquired), right foot: Secondary | ICD-10-CM

## 2017-05-05 NOTE — Patient Instructions (Signed)
Pre-Operative Instructions  Congratulations, you have decided to take an important step towards improving your quality of life.  You can be assured that the doctors and staff at [facility] will be with you every step of the way.  Here are some important things you should know:  1. Plan to be at the surgery center/hospital at least 1 (one) hour prior to your scheduled time, unless otherwise directed by the surgical center/hospital staff.  You must have a responsible adult accompany you, remain during the surgery and drive you home.  Make sure you have directions to the surgical center/hospital to ensure you arrive on time. 2. If you are having surgery at [facility] or [facility] hospitals, you will need a copy of your medical history and physical form from your family physician within one month prior to the date of surgery. We will give you a form for your primary physician to complete.  3. We make every effort to accommodate the date you request for surgery.  However, there are times where surgery dates or times have to be moved.  We will contact you as soon as possible if a change in schedule is required.   4. No aspirin/ibuprofen for one week before surgery.  If you are on aspirin, any non-steroidal anti-inflammatory medications (Mobic, Aleve, Ibuprofen) should not be taken seven (7) days prior to your surgery.  You make take Tylenol for pain prior to surgery.  5. Medications - If you are taking daily heart and blood pressure medications, seizure, reflux, allergy, asthma, anxiety, pain or diabetes medications, make sure you notify the surgery center/hospital before the day of surgery so they can tell you which medications you should take or avoid the day of surgery. 6. No food or drink after midnight the night before surgery unless directed otherwise by surgical center/hospital staff. 7. No alcoholic beverages 24-hours prior to surgery.  No smoking 24-hours prior or 24-hours after  surgery. 8. Wear loose pants or shorts. They should be loose enough to fit over bandages, boots, and casts. 9. Don't wear slip-on shoes. Sneakers are preferred. 10. Bring your boot with you to the surgery center/hospital.  Also bring crutches or a walker if your physician has prescribed it for you.  If you do not have this equipment, it will be provided for you after surgery. 11. If you have not been contacted by the surgery center/hospital by the day before your surgery, call to confirm the date and time of your surgery. 12. Leave-time from work may vary depending on the type of surgery you have.  Appropriate arrangements should be made prior to surgery with your employer. 13. Prescriptions will be provided immediately following surgery by your doctor.  Fill these as soon as possible after surgery and take the medication as directed. Pain medications will not be refilled on weekends and must be approved by the doctor. 14. Remove nail polish on the operative foot and avoid getting pedicures prior to surgery. 15. Wash the night before surgery.  The night before surgery wash the foot and leg well with water and the antibacterial soap provided. Be sure to pay special attention to beneath the toenails and in between the toes.  Wash for at least three (3) minutes. Rinse thoroughly with water and dry well with a towel.  Perform this wash unless told not to do so by your physician.  Enclosed: 1 Ice pack (please put in freezer the night before surgery)   1 Hibiclens skin cleaner     Pre-op instructions  If you have any questions regarding the instructions, please do not hesitate to call our office.  [place]: [address], [place], [place] [address] -- [phone]  [place]: [address] [place], [place] [address] -- [phone]  [place]: [address]  [place], [place] [address] -- [phone]  [place]: [address], [address], [place], [place] [address] -- [phone]  Website:  [email or web address] 

## 2017-05-05 NOTE — Progress Notes (Signed)
He presents with his wife today to discuss surgery on his right foot once again.  Last time dog was in we had scheduled him for an Liane Comber second metatarsal osteotomy of fifth metatarsal osteotomy and hammertoe repair with pins #2 #3 of the right foot.  He states that I have begun to think that it is a little worse at this point and may be that we are going to have to do something else.  He states that his second toe hurts worse than anything else.  Objective: Vital signs are stable alert and oriented x3 I have reviewed his past medical history medications allergy surgeries social history and review of systems.  Pulses are strongly palpable.  Neurologic sensorium is intact.  Moderate arthritic hammertoe deformity second digit right foot accompanied by a hallux abductovalgus deformity and tailor's bunion deformity.  Radiographs were taken today confirming significant osteoarthritis first metatarsophalangeal joint and lesser digits.  Tailor's bunion deformity confirmed.  No signs of infection in the bone.  Assessment: Hallux valgus painful second hammertoe deformity severely contracted and rigid.  Tailor's bunion deformity right.  Plan: At this point we discussed in great detail today amongst the 3 of [place] is different options including Keller arthroplasty fusion of the great toe different osteotomies.  He would like to consider making this much more simple performing McBride and tailors bunionectomies and amputation of the second toe.  He states that he does not have to want to have to go through all the healing that he did on his left foot again on this right foot.  I expressed to him that his bunion deformity would not look very good when compared to his left foot but the bump would be gone.  He understands that and is amenable to it.  Similarly with his fifth metatarsal as well.  I explained to him that the entire second toe will be completely gone he understands that is amenable to it and we will follow-up  with him in the near future for surgical intervention we did discuss the possible postop complications which may include but are not limited to postop pain bleeding swelling infection recurrence need for further surgery overcorrection under correction loss of digit loss of limb loss of life.  He already has a Cam boot at home we dispensed more information today regarding his preop in the surgery center as well as anesthesia.  Follow-up with him in the near future for surgical intervention.

## 2017-05-26 ENCOUNTER — Telehealth: Payer: Self-pay

## 2017-05-26 NOTE — Telephone Encounter (Signed)
Error refill has already been completed.

## 2017-06-18 DIAGNOSIS — M21621 Bunionette of right foot: Secondary | ICD-10-CM

## 2017-06-23 ENCOUNTER — Other Ambulatory Visit: Payer: Self-pay

## 2017-06-23 DIAGNOSIS — E1165 Type 2 diabetes mellitus with hyperglycemia: Principal | ICD-10-CM

## 2017-06-23 DIAGNOSIS — IMO0001 Reserved for inherently not codable concepts without codable children: Secondary | ICD-10-CM

## 2017-06-24 LAB — HEMOGLOBIN A1C
Hgb A1c MFr Bld: 7.8 % of total Hgb — ABNORMAL HIGH (ref <5.7)
Mean Plasma Glucose: 177 (calc)
eAG (mmol/L): 9.8 (calc)

## 2017-06-25 ENCOUNTER — Other Ambulatory Visit: Payer: Self-pay | Admitting: Podiatry

## 2017-06-25 MED ORDER — OXYCODONE-ACETAMINOPHEN 10-325 MG PO TABS: 1.0000 | ORAL_TABLET | Freq: Four times a day (QID) | ORAL | 0 refills | Status: AC | PRN

## 2017-06-25 MED ORDER — CEPHALEXIN 500 MG PO CAPS: 500.0000 mg | ORAL_CAPSULE | Freq: Three times a day (TID) | ORAL | 0 refills | Status: DC

## 2017-06-25 MED ORDER — ONDANSETRON HCL 4 MG PO TABS: 4.0000 mg | ORAL_TABLET | Freq: Three times a day (TID) | ORAL | 0 refills | Status: DC | PRN

## 2017-06-27 DIAGNOSIS — M2021 Hallux rigidus, right foot: Secondary | ICD-10-CM | POA: Diagnosis not present

## 2017-06-27 DIAGNOSIS — M21611 Bunion of right foot: Secondary | ICD-10-CM | POA: Diagnosis not present

## 2017-06-27 DIAGNOSIS — M8668 Other chronic osteomyelitis, other site: Secondary | ICD-10-CM | POA: Diagnosis not present

## 2017-06-27 DIAGNOSIS — M2011 Hallux valgus (acquired), right foot: Secondary | ICD-10-CM | POA: Diagnosis not present

## 2017-06-27 DIAGNOSIS — I10 Essential (primary) hypertension: Secondary | ICD-10-CM | POA: Diagnosis not present

## 2017-06-27 DIAGNOSIS — M25571 Pain in right ankle and joints of right foot: Secondary | ICD-10-CM | POA: Diagnosis not present

## 2017-07-02 ENCOUNTER — Ambulatory Visit (INDEPENDENT_AMBULATORY_CARE_PROVIDER_SITE_OTHER): Payer: Managed Care, Other (non HMO)

## 2017-07-02 ENCOUNTER — Encounter: Payer: Self-pay | Admitting: Podiatry

## 2017-07-02 ENCOUNTER — Ambulatory Visit (INDEPENDENT_AMBULATORY_CARE_PROVIDER_SITE_OTHER): Payer: Managed Care, Other (non HMO) | Admitting: Podiatry

## 2017-07-02 ENCOUNTER — Ambulatory Visit: Payer: Managed Care, Other (non HMO) | Admitting: Family Medicine

## 2017-07-02 VITALS — BP 143/58 | HR 60 | Temp 98.0°F

## 2017-07-02 DIAGNOSIS — M2041 Other hammer toe(s) (acquired), right foot: Secondary | ICD-10-CM

## 2017-07-02 DIAGNOSIS — M2011 Hallux valgus (acquired), right foot: Secondary | ICD-10-CM

## 2017-07-02 DIAGNOSIS — M21621 Bunionette of right foot: Secondary | ICD-10-CM | POA: Diagnosis not present

## 2017-07-02 NOTE — Progress Notes (Signed)
He presents today for his first postop visit status post Rogue Jury amputation his second metatarsophalangeal joint and a tailor's bunionectomy.  States that he has not had too much pain he also reports that pain is been tolerable with use of ibuprofen.  He denies fever chills nausea vomiting.  Continues current medications.  Objective: Vital signs are stable he is alert and oriented x3 dry sterile dressing intact was removed demonstrates no erythema there is mild edema no cellulitis drainage or odor.  Assessment: Well-healing surgical foot.  Plan: Redressed today dresser compressive dressing continue use the cam walker keep it elevated as much as possible follow-up with him in about 10 days.

## 2017-07-14 ENCOUNTER — Ambulatory Visit (INDEPENDENT_AMBULATORY_CARE_PROVIDER_SITE_OTHER): Payer: Managed Care, Other (non HMO) | Admitting: Podiatry

## 2017-07-14 ENCOUNTER — Encounter: Payer: Self-pay | Admitting: Podiatry

## 2017-07-14 DIAGNOSIS — M2041 Other hammer toe(s) (acquired), right foot: Secondary | ICD-10-CM

## 2017-07-14 NOTE — Progress Notes (Signed)
He presents today for follow-up of his [place] bunionectomy tailor's bunionectomy amputation f second toe right foot.  States that I do not have much pain now unless some on the foot too long.  Denies fever chills nausea vomiting muscle pain calf pain chest pain shortness of breath.  Objective: Vital signs are stable he is alert oriented x3 staples are intact margins well coapted many of the sutures are removed today.  Still does not appear to be 100% healed as far as the skin goserelin staples in for another week.  Assessment: Well-healing surgical foot just slow.  No signs of infection.  Plan: Leave the staples in 1 more week redressed today dresser compressive dressing continue to wear the cam walker on a follow-up with him in 1 week.

## 2017-07-30 ENCOUNTER — Encounter: Payer: Self-pay | Admitting: Podiatry

## 2017-07-30 ENCOUNTER — Ambulatory Visit: Payer: Managed Care, Other (non HMO)

## 2017-07-30 ENCOUNTER — Ambulatory Visit (INDEPENDENT_AMBULATORY_CARE_PROVIDER_SITE_OTHER): Payer: Managed Care, Other (non HMO) | Admitting: Podiatry

## 2017-07-30 DIAGNOSIS — M2011 Hallux valgus (acquired), right foot: Secondary | ICD-10-CM

## 2017-07-30 DIAGNOSIS — Z9889 Other specified postprocedural states: Secondary | ICD-10-CM

## 2017-07-30 DIAGNOSIS — M2041 Other hammer toe(s) (acquired), right foot: Secondary | ICD-10-CM

## 2017-07-30 DIAGNOSIS — M21621 Bunionette of right foot: Secondary | ICD-10-CM

## 2017-07-30 NOTE — Progress Notes (Signed)
He presents today date of surgery 06/27/2017 status post Liane Comber bunionectomy tailor's bunionectomy and amputation second toe on the right foot.  Last time he was and he was red hot swollen and he states that is doing much better now.  Objective: Vital signs are stable he is alert and oriented x3.  Still considerable edema to the right foot but nothing like it was last time I saw it.  Staples are intact margins well coapted I removed all of the stitches and staples today.  Redressed him and dressed a compressive dressing making sure that this will stay covered for the next to 3 days to help heal first before he starts getting this wet.  Assessment: Well-healing surgical foot.  Plan: Follow-up with him in a couple weeks just to make sure he is healing well.  X-rays will be taken at that time.

## 2017-08-25 ENCOUNTER — Ambulatory Visit (INDEPENDENT_AMBULATORY_CARE_PROVIDER_SITE_OTHER): Payer: Managed Care, Other (non HMO) | Admitting: Podiatry

## 2017-08-25 ENCOUNTER — Ambulatory Visit (INDEPENDENT_AMBULATORY_CARE_PROVIDER_SITE_OTHER): Payer: Managed Care, Other (non HMO)

## 2017-08-25 ENCOUNTER — Encounter: Payer: Self-pay | Admitting: Podiatry

## 2017-08-25 DIAGNOSIS — Z9889 Other specified postprocedural states: Secondary | ICD-10-CM

## 2017-08-25 DIAGNOSIS — M2041 Other hammer toe(s) (acquired), right foot: Secondary | ICD-10-CM

## 2017-08-25 DIAGNOSIS — M2011 Hallux valgus (acquired), right foot: Secondary | ICD-10-CM

## 2017-08-25 DIAGNOSIS — M21621 Bunionette of right foot: Secondary | ICD-10-CM

## 2017-08-25 DIAGNOSIS — L309 Dermatitis, unspecified: Secondary | ICD-10-CM | POA: Diagnosis not present

## 2017-08-25 MED ORDER — METHYLPREDNISOLONE 4 MG PO TBPK: ORAL_TABLET | ORAL | 0 refills | Status: DC

## 2017-08-25 NOTE — Addendum Note (Signed)
Addended by: Rip Harbour on: 08/25/2017 04:27 PM   Modules accepted: Orders

## 2017-08-25 NOTE — Progress Notes (Signed)
dermati

## 2017-08-25 NOTE — Progress Notes (Signed)
He presents today for follow-up of his McBride bunionectomy and tailor's bunion deformity with repair right foot amputation second toe.  He states he is doing just great he has no problems whatsoever is very happy with the outcome.  Objective: Vital signs are stable he is alert and oriented x3 there is mild edema there is no erythema cellulitis drainage or odor no pain reproducible pain no open lesions or wounds.  Assessment: Well-healing surgical foot right.  Plan: Follow-up with me on an as-needed basis.

## 2017-09-08 ENCOUNTER — Other Ambulatory Visit: Payer: Self-pay | Admitting: Family Medicine

## 2017-09-08 DIAGNOSIS — E1165 Type 2 diabetes mellitus with hyperglycemia: Principal | ICD-10-CM

## 2017-09-08 DIAGNOSIS — IMO0001 Reserved for inherently not codable concepts without codable children: Secondary | ICD-10-CM

## 2017-09-09 ENCOUNTER — Encounter: Payer: Self-pay | Admitting: Podiatry

## 2017-12-15 LAB — HM DIABETES EYE EXAM

## 2017-12-29 ENCOUNTER — Telehealth: Payer: Self-pay

## 2017-12-29 DIAGNOSIS — Z125 Encounter for screening for malignant neoplasm of prostate: Secondary | ICD-10-CM

## 2017-12-29 DIAGNOSIS — E785 Hyperlipidemia, unspecified: Secondary | ICD-10-CM

## 2017-12-29 DIAGNOSIS — I10 Essential (primary) hypertension: Secondary | ICD-10-CM

## 2017-12-29 DIAGNOSIS — E118 Type 2 diabetes mellitus with unspecified complications: Secondary | ICD-10-CM

## 2017-12-29 DIAGNOSIS — E1169 Type 2 diabetes mellitus with other specified complication: Secondary | ICD-10-CM

## 2017-12-29 NOTE — Telephone Encounter (Signed)
Patient made a follow up for DM and med refills on 12/31/17.  He would like to come by tomorrow and get any labs completed that you need.  Can you please put orders in.  Thank you

## 2017-12-29 NOTE — Telephone Encounter (Signed)
Orders are placed for tomorrow Tues 11/26.  [name], DO [facility] 12/29/2017, 6:03 PM

## 2017-12-30 ENCOUNTER — Other Ambulatory Visit: Payer: Self-pay

## 2017-12-30 DIAGNOSIS — I10 Essential (primary) hypertension: Secondary | ICD-10-CM

## 2017-12-30 DIAGNOSIS — E785 Hyperlipidemia, unspecified: Principal | ICD-10-CM

## 2017-12-30 DIAGNOSIS — E118 Type 2 diabetes mellitus with unspecified complications: Secondary | ICD-10-CM

## 2017-12-30 DIAGNOSIS — Z125 Encounter for screening for malignant neoplasm of prostate: Secondary | ICD-10-CM

## 2017-12-30 DIAGNOSIS — E1169 Type 2 diabetes mellitus with other specified complication: Secondary | ICD-10-CM

## 2017-12-31 ENCOUNTER — Ambulatory Visit: Payer: Managed Care, Other (non HMO) | Admitting: Family Medicine

## 2017-12-31 ENCOUNTER — Encounter: Payer: Self-pay | Admitting: Family Medicine

## 2017-12-31 VITALS — BP 130/68 | HR 65 | Resp 16 | Ht 76.0 in | Wt 274.6 lb

## 2017-12-31 DIAGNOSIS — F325 Major depressive disorder, single episode, in full remission: Secondary | ICD-10-CM | POA: Diagnosis not present

## 2017-12-31 DIAGNOSIS — E1169 Type 2 diabetes mellitus with other specified complication: Secondary | ICD-10-CM | POA: Diagnosis not present

## 2017-12-31 DIAGNOSIS — I10 Essential (primary) hypertension: Secondary | ICD-10-CM | POA: Diagnosis not present

## 2017-12-31 DIAGNOSIS — Z23 Encounter for immunization: Secondary | ICD-10-CM | POA: Diagnosis not present

## 2017-12-31 DIAGNOSIS — I872 Venous insufficiency (chronic) (peripheral): Secondary | ICD-10-CM

## 2017-12-31 DIAGNOSIS — E669 Obesity, unspecified: Secondary | ICD-10-CM

## 2017-12-31 DIAGNOSIS — E785 Hyperlipidemia, unspecified: Secondary | ICD-10-CM

## 2017-12-31 LAB — COMPLETE METABOLIC PANEL WITH GFR
AG Ratio: 2.1 (calc) (ref 1.0–2.5)
ALT: 40 U/L (ref 9–46)
AST: 28 U/L (ref 10–35)
Albumin: 4.1 g/dL (ref 3.6–5.1)
Alkaline phosphatase (APISO): 51 U/L (ref 40–115)
BUN: 11 mg/dL (ref 7–25)
CO2: 27 mmol/L (ref 20–32)
Calcium: 8.9 mg/dL (ref 8.6–10.3)
Chloride: 100 mmol/L (ref 98–110)
Creat: 0.74 mg/dL (ref 0.70–1.25)
GFR, Est African American: 111 mL/min/{1.73_m2} (ref >=60)
GFR, Est Non African American: 95 mL/min/{1.73_m2} (ref >=60)
Globulin: 2 g/dL (calc) (ref 1.9–3.7)
Glucose, Bld: 222 mg/dL — ABNORMAL HIGH (ref 65–99)
Potassium: 3.7 mmol/L (ref 3.5–5.3)
Sodium: 136 mmol/L (ref 135–146)
Total Bilirubin: 0.7 mg/dL (ref 0.2–1.2)
Total Protein: 6.1 g/dL (ref 6.1–8.1)

## 2017-12-31 LAB — HEMOGLOBIN A1C
Hgb A1c MFr Bld: 8.1 % of total Hgb — ABNORMAL HIGH (ref <5.7)
Mean Plasma Glucose: 186 (calc)
eAG (mmol/L): 10.3 (calc)

## 2017-12-31 LAB — LIPID PANEL
Cholesterol: 124 mg/dL (ref <200)
HDL: 33 mg/dL — ABNORMAL LOW (ref >40)
LDL Cholesterol (Calc): 59 mg/dL (calc)
Non-HDL Cholesterol (Calc): 91 mg/dL (calc) (ref <130)
Total CHOL/HDL Ratio: 3.8 (calc) (ref <5.0)
Triglycerides: 301 mg/dL — ABNORMAL HIGH (ref <150)

## 2017-12-31 LAB — PSA: PSA: 0.8 ng/mL (ref <=4.0)

## 2017-12-31 MED ORDER — METFORMIN HCL ER 500 MG PO TB24: 500.0000 mg | ORAL_TABLET | Freq: Every day | ORAL | 3 refills | Status: DC

## 2017-12-31 NOTE — Patient Instructions (Addendum)
Thank you for coming to the office today.  Recent Labs    06/23/17 1003 12/30/17 1028  HGBA1C 7.8* 8.1*   Try to improve diet as discussed limit late night snack foods, and try to help control sugar down to A1c 7s, and we can maybe avoid other medication.  Call insurance find cost and coverage of the following  1. Ozempic (Semaglutide injection) - same as trulicity  2. Farxiga (dapagliflozin) or Jardiance (empaglifozin) - pills  For leg swelling likely from varicose veins and leaky veins causing gravity dependent swelling. - No harm to you based on my evaluation - Likely some worsening swelling from Amlodipine as a side effect  - if not bothering you we can keep meds as they are - If want to change - would try first reduce Amlodipine from 10 to 5 (cut in half) see if swelling is reduced, or if need can STOP completely to see if swelling improves more - we can swap it out with a new BP med such as HCTZ thiazide for more fluid pill component OR just increase lisinopril  Please schedule a Follow-up Appointment to: Return in about 6 months (around 07/01/2018) for DM A1c med adjust.  If you have any other questions or concerns, please feel free to call the office or send a message through [place]. You may also schedule an earlier appointment if necessary.  Additionally, you may be receiving a survey about your experience at our office within a few days to 1 week by e-mail or mail. We value your feedback.  [name], DO [place]

## 2017-12-31 NOTE — Progress Notes (Signed)
Subjective:    Patient ID: Kevin Perry, male    DOB: 09-26-1950, 67 y.o.   MRN: [id number]  Kevin Perry is a 67 y.o. male presenting on 12/31/2017 for Diabetes; Hyperlipidemia; Depression; and Hypertension  He states will change insurance to Medicare Part A & B in 2020.  HPI   CHRONIC DM, Type 2: Last visit 05/979, increased Trulicity 1.91 up to 1.5mg  weekly. - Today no concerns except still has persistent elevated fasting AM CBG. He admits if eats late night snack. -CBGs: Avg AM reading160-170s, PM readingbeforesupper130 (but < 150) . Often he will have late night snack when home late from work, will eat snacks potato chips or other Meds: Trulicity1.5mg  weekly, Glipizide 10mg  daily, Metformin XR 500mg  daily - He checked with ins they will cover Ozempic if needed Reports good compliance. Tolerating well w/o side-effects Currently on ACEi Lifestyle: - Diet (trying to improve diet, not always adhering to low carb, often eats late snack) - Exercise (walkregularlyat hospitalwhile working) - Last ophtho eye exam reportedly 02/2017 by [facility] - we have not received fax record of DM eye exam from them still - even though we requested it - will send them fax request again today Denies hypoglycemia, polyuria, visual changes, numbness or tingling.  CHRONIC HTN: Reports no concerns, outside BP readings are normal. Current Meds - Amlodipine 10mg  daily, Furosemide 40mg  dailiy, Lisinopril 20mg  daily, Metoprolol XL 50mg  daily   Reports good compliance, took meds today. Tolerating well, w/o complaints. Denies CP, dyspnea, HA, edema, dizziness / lightheadedness  HYPERLIPIDEMIA / Obesity BMI >33 - Reports no concerns. Last lipid panel 12/2017, controlled with mild low HDL - Currently taking Rosuvastatin 10mg , tolerating well without side effects or myalgias  Peripheral Edema Reports chronic issue with some leg swelling on both sides, recently seemed slightly  worse. No focal swelling. He has some varicose veins by his report. He has history of problem with toes and feet, sees Podiatry Dr Milinda Pointer. Regarding swelling, worse if on feet prolonged, often for work he is working and on his feet. Swelling improves overnight if elevated and resting. Not wearing compression. - He takes Furosemide 40mg  daily for edema, with minimal improvement - Also he takes Amlodipine 10mg   Major Depression, In remission Reports no new concerns. See PHQ. Denies mood problem at this time. He has history of mixed issue with mood and anxiety. Controlled on Escitalopram currently.   Health Maintenance: Due for 2nd pneumonia vaccine >1 year after previous vaccine, now to receive Pneumovax-23 today  UTD Flu Vaccine 10/2017  Depression screen [facility] 2/9 12/31/2017 04/24/2017 03/12/2017  Decreased Interest 0 0 0  Down, Depressed, Hopeless 0 0 0  PHQ - 2 Score 0 0 0  Altered sleeping 0 0 0  Tired, decreased energy 0 0 0  Change in appetite 0 0 0  Feeling bad or failure about yourself  0 0 0  Trouble concentrating 0 0 0  Moving slowly or fidgety/restless 0 0 0  Suicidal thoughts 0 0 0  PHQ-9 Score 0 0 0  Difficult doing work/chores Not difficult at all Not difficult at all Not difficult at all    Social History   Tobacco Use  . Smoking status: Former Smoker    Last attempt to quit: 08/16/2004    Years since quitting: 13.3  . Smokeless tobacco: Former Systems developer  . Tobacco comment: quit 20 plus years  Substance Use Topics  . Alcohol use: No    Comment: SOCIAL  .  Drug use: No    Review of Systems  Constitutional: Negative for activity change, appetite change, chills, diaphoresis, fatigue and fever.  HENT: Negative for congestion and hearing loss.   Eyes: Negative for visual disturbance.  Respiratory: Negative for apnea, cough, choking, chest tightness, shortness of breath and wheezing.   Cardiovascular: Positive for leg swelling. Negative for chest pain and palpitations.    Gastrointestinal: Negative for abdominal pain, anal bleeding, blood in stool, constipation, diarrhea, nausea and vomiting.  Endocrine: Negative for cold intolerance.  Genitourinary: Negative for decreased urine volume, difficulty urinating, dysuria, frequency, hematuria and urgency.  Musculoskeletal: Negative for arthralgias, back pain and neck pain.  Skin: Negative for rash.  Allergic/Immunologic: Negative for environmental allergies.  Neurological: Negative for dizziness, weakness, light-headedness, numbness and headaches.  Hematological: Negative for adenopathy.  Psychiatric/Behavioral: Negative for behavioral problems, dysphoric mood and sleep disturbance. The patient is not nervous/anxious.    Per HPI unless specifically indicated above     Objective:    BP 130/68 (BP Location: Right Arm, Patient Position: Sitting, Cuff Size: Large)   Pulse 65   Resp 16   Ht 6\' 4"  (1.93 m)   Wt 274 lb 9.6 oz (124.6 kg)   BMI 33.43 kg/m   Wt Readings from Last 3 Encounters:  12/31/17 274 lb 9.6 oz (124.6 kg)  04/24/17 275 lb (124.7 kg)  03/12/17 278 lb 12.8 oz (126.5 kg)    Physical Exam  Constitutional: He is oriented to person, place, and time. He appears well-developed and well-nourished. No distress.  Well-appearing, comfortable, cooperative  HENT:  Head: Normocephalic and atraumatic.  Mouth/Throat: Oropharynx is clear and moist.  Eyes: Pupils are equal, round, and reactive to light. Conjunctivae and EOM are normal. Right eye exhibits no discharge. Left eye exhibits no discharge.  Neck: Normal range of motion. Neck supple. No thyromegaly present.  Cardiovascular: Normal rate, regular rhythm, normal heart sounds and intact distal pulses.  No murmur heard. Pulmonary/Chest: Effort normal and breath sounds normal. No respiratory distress. He has no wheezes. He has no rales.  Abdominal: Soft. Bowel sounds are normal. He exhibits no distension and no mass. There is no tenderness.   Musculoskeletal: Normal range of motion. He exhibits edema (bilateral non pitting +1-2 edema symmetrical, w/ varicose veins and spider veins, some stasis dermatitis skin darkening, no ulceration, no erythema). He exhibits no tenderness.  Upper / Lower Extremities: - Normal muscle tone, strength bilateral upper extremities 5/5, lower extremities 5/5  See foot exam  Lymphadenopathy:    He has no cervical adenopathy.  Neurological: He is alert and oriented to person, place, and time.  Distal sensation intact to light touch all extremities  Skin: Skin is warm and dry. No rash noted. He is not diaphoretic. No erythema.  Psychiatric: He has a normal mood and affect. His behavior is normal.  Well groomed, good eye contact, normal speech and thoughts  Nursing note and vitals reviewed.    Diabetic Foot Exam - Simple   Simple Foot Form Diabetic Foot exam was performed with the following findings:  Yes 12/31/2017  1:38 PM  Visual Inspection See comments:  Yes Sensation Testing See comments:  Yes Pulse Check Posterior Tibialis and Dorsalis pulse intact bilaterally:  Yes Comments Bilateral feet with non pitting edema, varicose veins. Previous history of toe amputation / surgery, R foot 2nd toe s/p amputation. L foot partial distal amputation 3rd toe, also some bunion formation, and dry skin callus formation bilaterally.     Results for  orders placed or performed in visit on 12/30/17  Lipid panel  Result Value Ref Range   Cholesterol 124 <200 mg/dL   HDL 33 (L) >40 mg/dL   Triglycerides 301 (H) <150 mg/dL   LDL Cholesterol (Calc) 59 mg/dL (calc)   Total CHOL/HDL Ratio 3.8 <5.0 (calc)   Non-HDL Cholesterol (Calc) 91 <130 mg/dL (calc)  COMPLETE METABOLIC PANEL WITH GFR  Result Value Ref Range   Glucose, Bld 222 (H) 65 - 99 mg/dL   BUN 11 7 - 25 mg/dL   Creat 0.74 0.70 - 1.25 mg/dL   GFR, Est Non African American 95 > OR = 60 mL/min/1.51m2   GFR, Est African American 111 > OR = 60  mL/min/1.9m2   BUN/Creatinine Ratio NOT APPLICABLE 6 - 22 (calc)   Sodium 136 135 - 146 mmol/L   Potassium 3.7 3.5 - 5.3 mmol/L   Chloride 100 98 - 110 mmol/L   CO2 27 20 - 32 mmol/L   Calcium 8.9 8.6 - 10.3 mg/dL   Total Protein 6.1 6.1 - 8.1 g/dL   Albumin 4.1 3.6 - 5.1 g/dL   Globulin 2.0 1.9 - 3.7 g/dL (calc)   AG Ratio 2.1 1.0 - 2.5 (calc)   Total Bilirubin 0.7 0.2 - 1.2 mg/dL   Alkaline phosphatase (APISO) 51 40 - 115 U/L   AST 28 10 - 35 U/L   ALT 40 9 - 46 U/L  Hemoglobin A1c  Result Value Ref Range   Hgb A1c MFr Bld 8.1 (H) <5.7 % of total Hgb   Mean Plasma Glucose 186 (calc)   eAG (mmol/L) 10.3 (calc)  PSA  Result Value Ref Range   PSA 0.8 < OR = 4.0 ng/mL      Assessment & Plan:   Problem List Items Addressed This Visit    Edema of both lower extremities due to peripheral venous insufficiency   Relevant Medications   furosemide (LASIX) 40 MG tablet   Essential hypertension    Well-controlled HTN - Home BP readings normal  History of MI / CAD. Peripheral edema   Plan:  1. Continue current BP regimen - Amlodipine 10mg  daily, Metoprolol XL 50mg  daily, Furosemide 40mg  daily 2. Encourage improved lifestyle - low sodium diet, regular exercise 3. Continue monitor BP outside office, bring readings to next visit, if persistently >140/90 or new symptoms notify office sooner 4. Follow-up within 3-6 months - consider reducing Amlodipine to help reduce swelling and may consider thiazide again, discussed today he prefers to avoid change. We may not need furosemide long term, as I am unsure if this is helping       Relevant Medications   furosemide (LASIX) 40 MG tablet   Hyperlipidemia associated with type 2 diabetes mellitus ([place])    Well controlled cholesterol on statin and lifestyle Last lipid panel 12/2017 Calculated ASCVD 10 yr risk score elevated  Plan: 1. Continue current meds - Rosuvastatin 10mg  daily 2. Continue ASA 81mg  for primary ASCVD risk reduction 3.  Encourage improved lifestyle - low carb/cholesterol, reduce portion size, continue improving regular exercise 4. Follow-up lipids q 1 yr      Relevant Medications   metFORMIN (GLUCOPHAGE-XR) 500 MG 24 hr tablet   Major depression in remission ([place])    Stable, controlled in remission on SSRI Continue current therapy      Obesity (BMI 30.0-34.9)    Encourage wt loss with lifestyle changes      Type 2 diabetes mellitus with other specified complication ([place]) - Primary  Elevated A1c up to 8.1 from 7.8 - gradual increase in A1c trend - Long interval since last visit >6 months Complicated by Hyperglycemia, Hyperlipidemia, Mood - increase risk of cardiovascular abnormality w/ known CAD - Prior Meds: Onglyza (ins/cost), Metformin XR 750 (GI intolerance)  Plan:  1. CONTINUE current meds - Trulicity 1.5mg  weekly [place] inj, Metformin XR 500mg  daily, GLipizide 10mg  daily - Counseling on risk hypoglycemia on sulfonylurea - Since he is changing insurance to Medicare in 2020 - we will hold off on med change today - advised that if A1c stays >8 we will consider switch to Ozempic instead of Trulicity for higher potency or consider add SGLT2 - likely remove sulfonylurea 2. Encourage improved lifestyle - low carb, low sugar diet, reduce portion size, goal for regular balanced 3 x daily meal instead of skipping lunch, continue improving regular exercise 3. Check CBG, bring log to next visit for review - check 3x weekly 4. Continue ASA, ACEi, Statin 5. Request record via fax to [facility] - DM Foot exam done today - complicated feet, followed by Podiatry Dr Milinda Pointer 6. Follow-up 3-6 months DM A1c      Relevant Medications   metFORMIN (GLUCOPHAGE-XR) 500 MG 24 hr tablet    Other Visit Diagnoses    Need for 23-polyvalent pneumococcal polysaccharide vaccine       Relevant Orders   Pneumococcal polysaccharide vaccine 23-valent greater than or equal to 2yo subcutaneous/IM (Completed)        Meds ordered this encounter  Medications  . metFORMIN (GLUCOPHAGE-XR) 500 MG 24 hr tablet    Sig: Take 1 tablet (500 mg total) by mouth daily with breakfast.    Dispense:  90 tablet    Refill:  3    Follow up plan: Return in about 6 months (around 07/01/2018) for DM A1c med adjust.  [name], DO [facility] 12/31/2017, 1:24 PM

## 2017-12-31 NOTE — Assessment & Plan Note (Signed)
Encourage wt loss with lifestyle changes

## 2017-12-31 NOTE — Assessment & Plan Note (Signed)
Well controlled cholesterol on statin and lifestyle Last lipid panel 12/2017 Calculated ASCVD 10 yr risk score elevated  Plan: 1. Continue current meds - Rosuvastatin 10mg  daily 2. Continue ASA 81mg  for primary ASCVD risk reduction 3. Encourage improved lifestyle - low carb/cholesterol, reduce portion size, continue improving regular exercise 4. Follow-up lipids q 1 yr

## 2017-12-31 NOTE — Assessment & Plan Note (Signed)
Elevated A1c up to 8.1 from 7.8 - gradual increase in A1c trend - Long interval since last visit >6 months Complicated by Hyperglycemia, Hyperlipidemia, Mood - increase risk of cardiovascular abnormality w/ known CAD - Prior Meds: Onglyza (ins/cost), Metformin XR 750 (GI intolerance)  Plan:  1. CONTINUE current meds - Trulicity 1.5mg  weekly [place] inj, Metformin XR 500mg  daily, GLipizide 10mg  daily - Counseling on risk hypoglycemia on sulfonylurea - Since he is changing insurance to Medicare in 2020 - we will hold off on med change today - advised that if A1c stays >8 we will consider switch to Ozempic instead of Trulicity for higher potency or consider add SGLT2 - likely remove sulfonylurea 2. Encourage improved lifestyle - low carb, low sugar diet, reduce portion size, goal for regular balanced 3 x daily meal instead of skipping lunch, continue improving regular exercise 3. Check CBG, bring log to next visit for review - check 3x weekly 4. Continue ASA, ACEi, Statin 5. Request record via fax to [facility] - DM Foot exam done today - complicated feet, followed by Podiatry Dr Milinda Pointer 6. Follow-up 3-6 months DM A1c

## 2017-12-31 NOTE — Assessment & Plan Note (Signed)
Well-controlled HTN - Home BP readings normal  History of MI / CAD. Peripheral edema   Plan:  1. Continue current BP regimen - Amlodipine 10mg  daily, Metoprolol XL 50mg  daily, Furosemide 40mg  daily 2. Encourage improved lifestyle - low sodium diet, regular exercise 3. Continue monitor BP outside office, bring readings to next visit, if persistently >140/90 or new symptoms notify office sooner 4. Follow-up within 3-6 months - consider reducing Amlodipine to help reduce swelling and may consider thiazide again, discussed today he prefers to avoid change. We may not need furosemide long term, as I am unsure if this is helping

## 2017-12-31 NOTE — Assessment & Plan Note (Signed)
Stable, controlled in remission on SSRI Continue current therapy

## 2018-01-05 IMAGING — DX DG CHEST 2V
3 series · 3 of 3 positions shown · non-contrast
Comparison: 03/04/2016

CLINICAL DATA: Former smoker.  Cough.

EXAM:
CHEST  2 VIEW

[chest lat (1 of 2)]
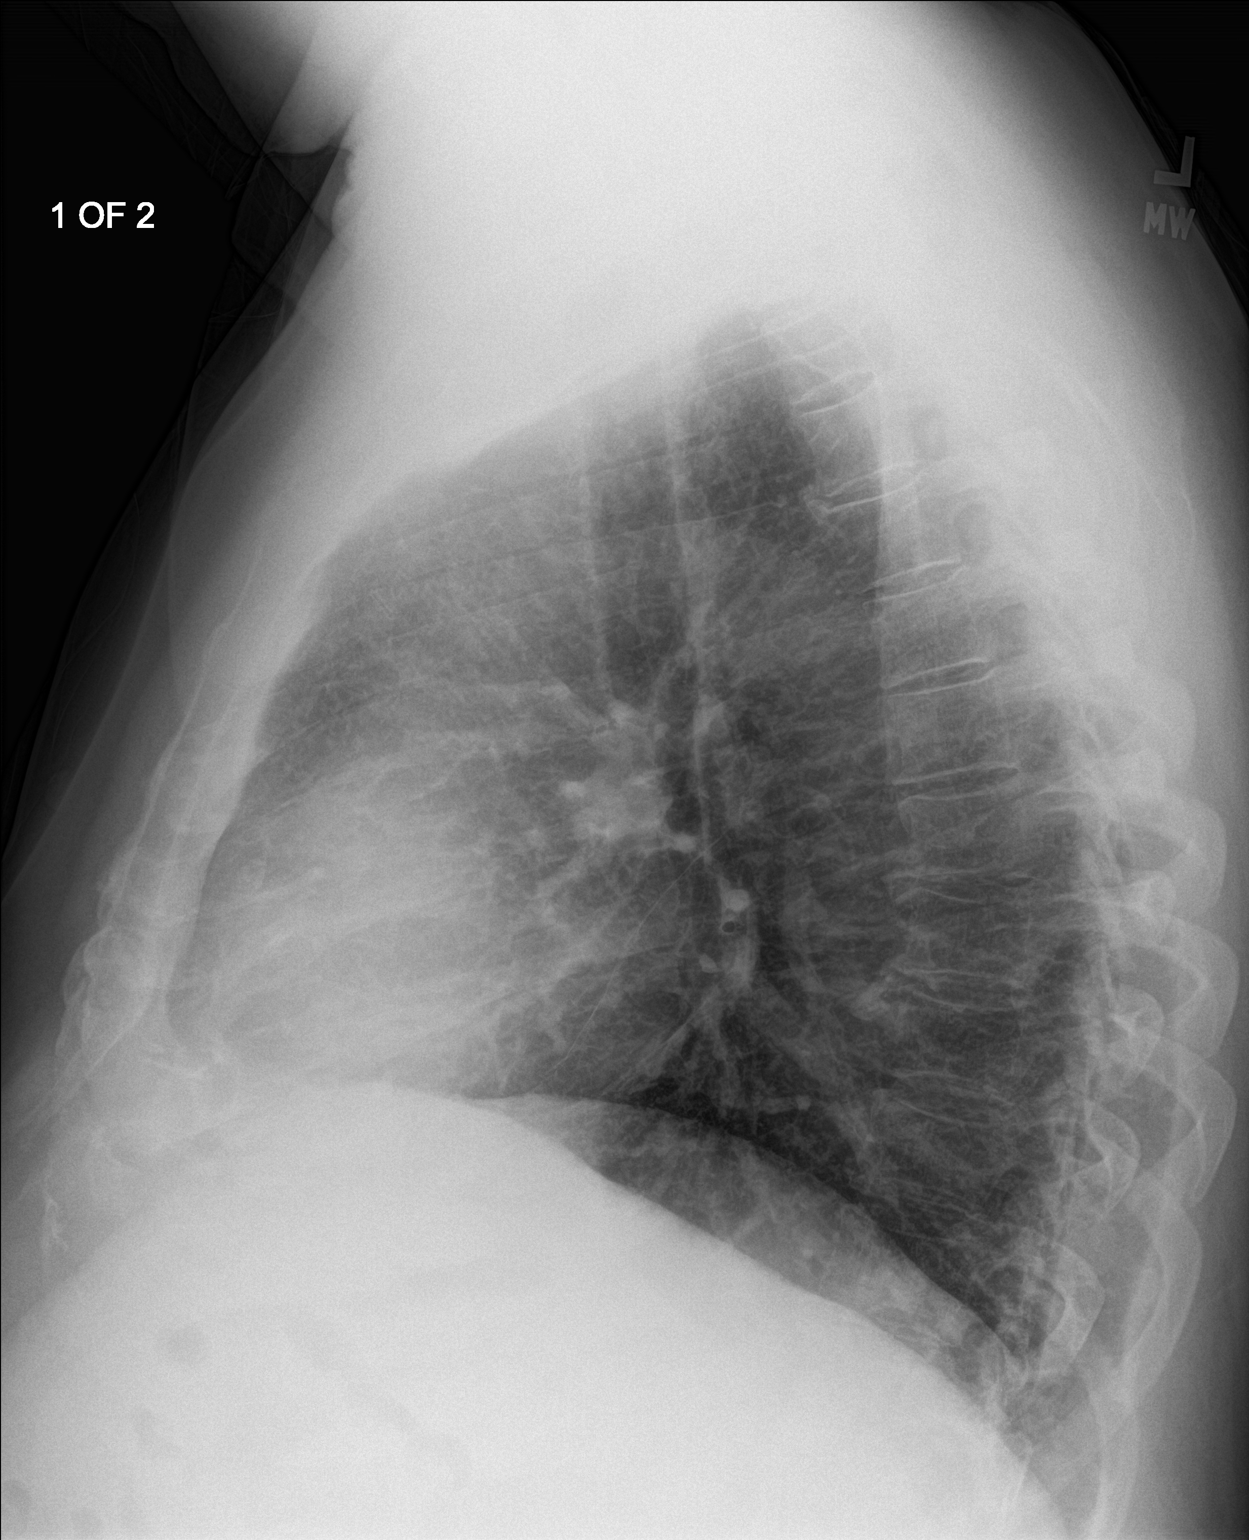

[chest pa]
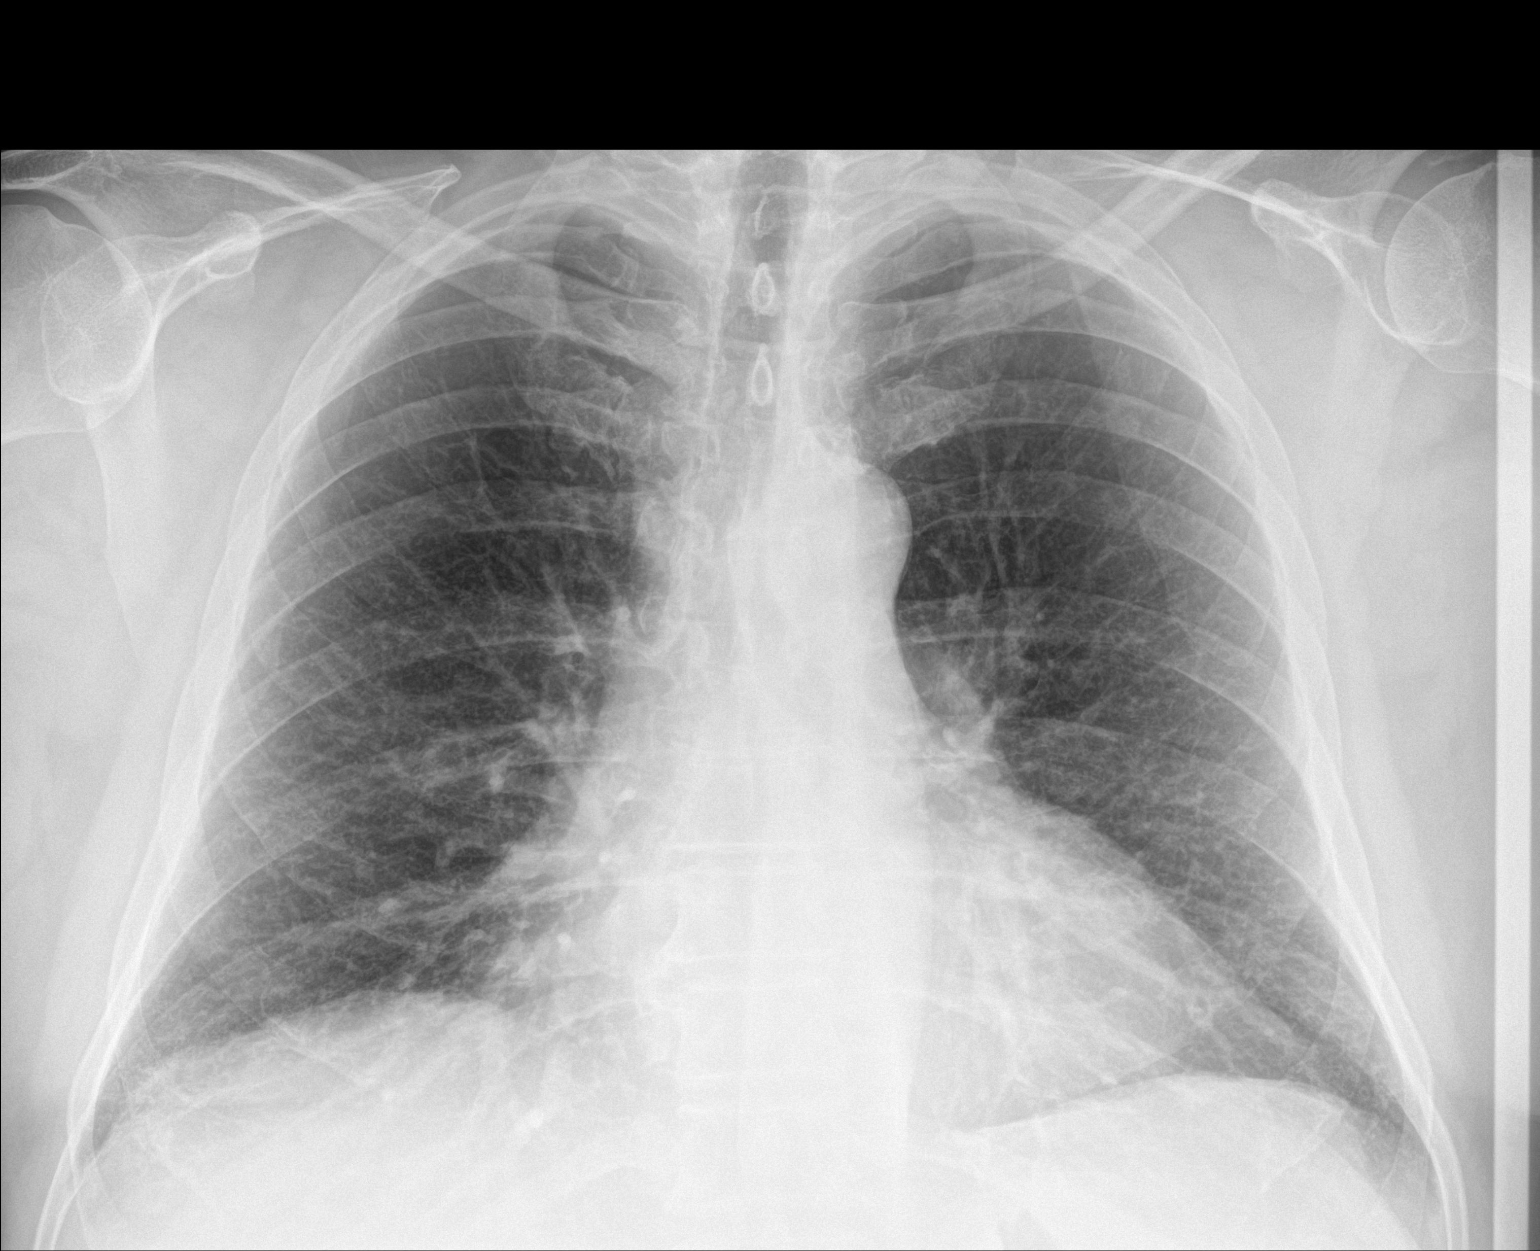

[chest lat (2 of 2)]
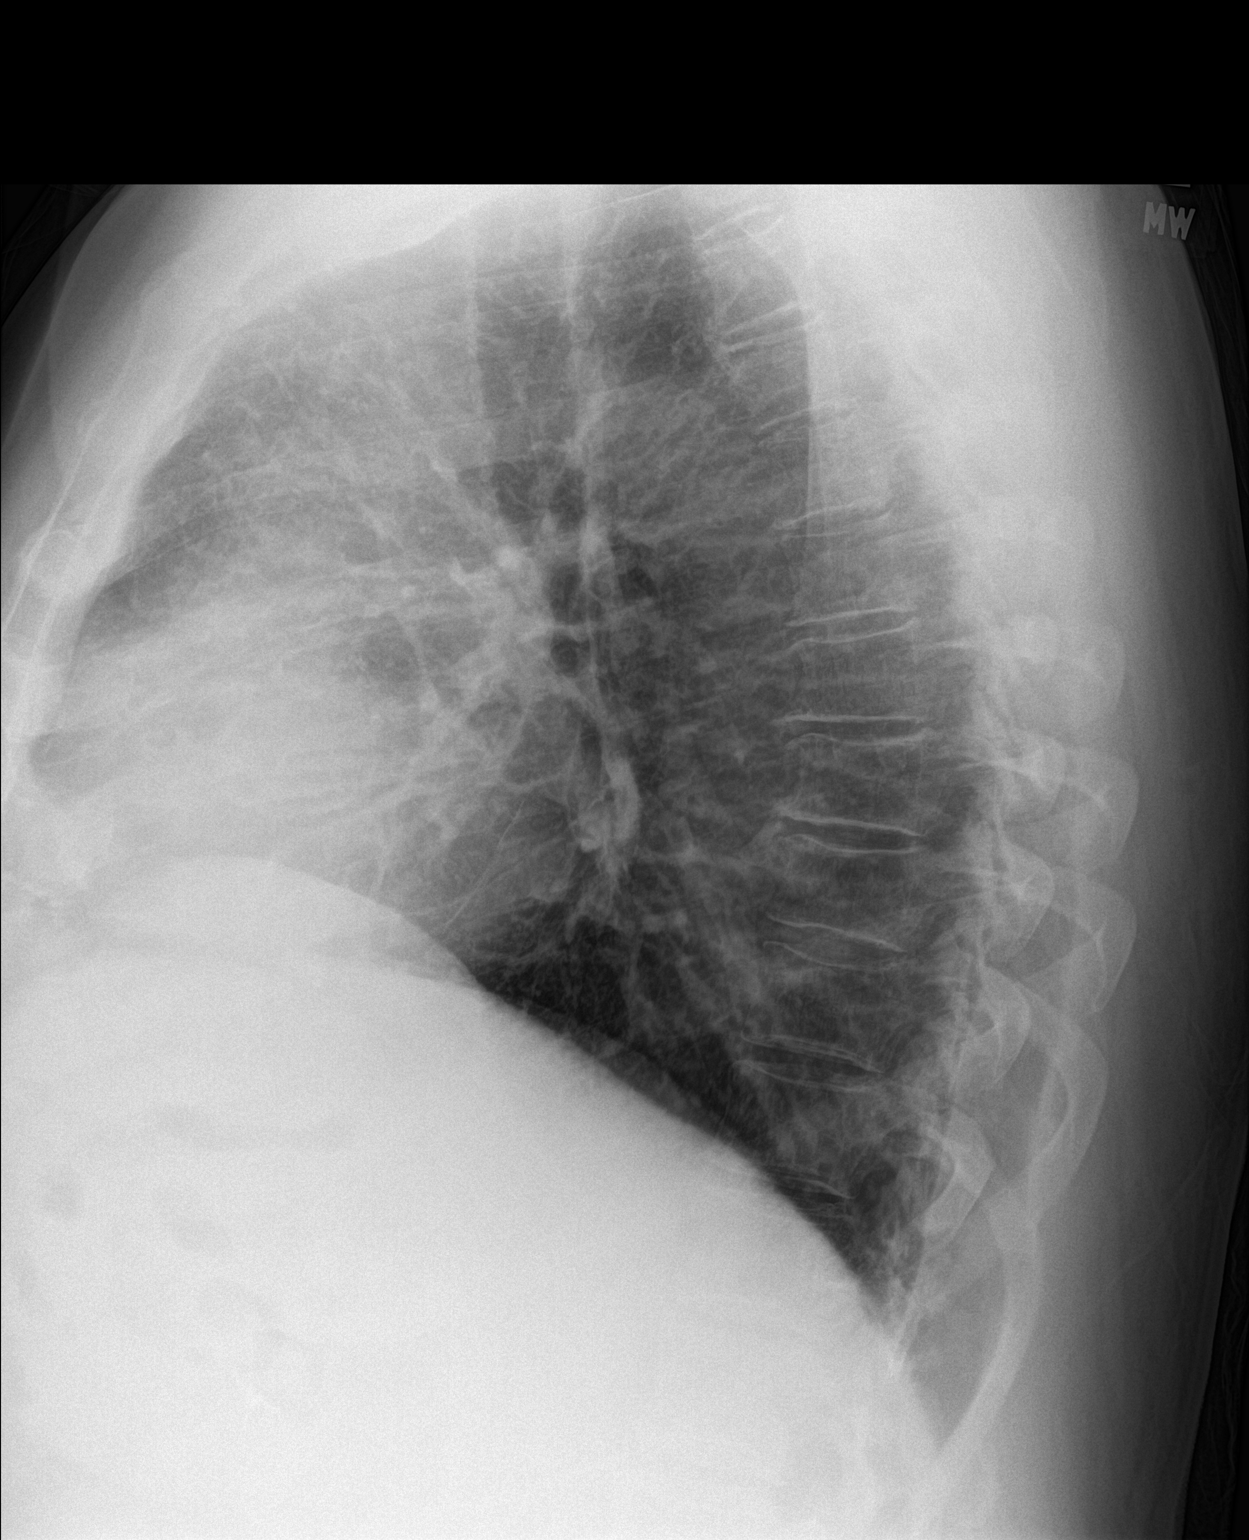

[3 of 3 positions shown; findings below may reference images not displayed]

FINDINGS: Mild peribronchial thickening and interstitial prominence. Heart and
mediastinal contours are within normal limits. No focal opacities or
effusions. No acute bony abnormality.
IMPRESSION: Mild bronchitic changes

## 2018-01-08 ENCOUNTER — Encounter: Payer: Self-pay | Admitting: Family Medicine

## 2018-01-23 ENCOUNTER — Other Ambulatory Visit: Payer: Self-pay

## 2018-01-23 ENCOUNTER — Ambulatory Visit (INDEPENDENT_AMBULATORY_CARE_PROVIDER_SITE_OTHER): Payer: Managed Care, Other (non HMO) | Admitting: Nurse Practitioner

## 2018-01-23 ENCOUNTER — Encounter: Payer: Self-pay | Admitting: Nurse Practitioner

## 2018-01-23 VITALS — BP 136/61 | HR 65 | Temp 98.1°F | Resp 18 | Ht 76.0 in | Wt 275.2 lb

## 2018-01-23 DIAGNOSIS — J019 Acute sinusitis, unspecified: Secondary | ICD-10-CM | POA: Diagnosis not present

## 2018-01-23 MED ORDER — IPRATROPIUM BROMIDE 0.06 % NA SOLN: 2.0000 | Freq: Four times a day (QID) | NASAL | 0 refills | Status: DC

## 2018-01-23 NOTE — Patient Instructions (Addendum)
Kevin Perry,   Thank you for coming in to clinic today.  1. Change to Mucinex DM  1. It sounds like you have a Upper Respiratory Virus - this will most likely run it's course in 7 to 10 days. Recommend good hand washing. - Start Atrovent nasal spray decongestant 2 sprays each nostril up to 4 times daily for 5-7 days - Continue anti-histamine loratadine 10mg  daily, also can use Flonase 2 sprays each nostril daily for up to 4-6 weeks - If congestion is worse, start OTC Mucinex (or may try Mucinex-DM for cough) up to 7-10 days then stop - Drink plenty of fluids to improve congestion - You may try over the counter Nasal Saline spray (Simply Saline, Ocean Spray) as needed to reduce congestion. - Drink warm herbal tea with honey for sore throat. - Start taking Tylenol extra strength 1 to 2 tablets every 6-8 hours for aches or fever/chills for next few days as needed.  Do not take more than 3,000 mg in 24 hours from all medicines.  May take Ibuprofen as well if tolerated 200-400mg  every 8 hours as needed.  If symptoms significantly worsening with persistent fevers/chills despite tylenol/ibpurofen, nausea, vomiting unable to tolerate food/fluids or medicine, body aches, or shortness of breath, sinus pain pressure or worsening productive cough, then follow-up for re-evaluation, may seek more immediate care at Urgent Care or ED if more concerned for emergency.  Please schedule a follow-up appointment with [name], AGNP. Return 5-7 days if symptoms worsen or fail to improve.  If you have any other questions or concerns, please feel free to call the clinic or send a message through [place]. You may also schedule an earlier appointment if necessary.  You will receive a survey after today's visit either digitally by e-mail or paper by [employer]. Your experiences and feedback matter to [place].  Please respond so we know how we are doing as we provide care for you.   [name], DNP,  AGNP-BC Adult Gerontology Nurse Practitioner [place]

## 2018-01-23 NOTE — Progress Notes (Signed)
Subjective:    Patient ID: Kevin Perry, male    DOB: [birth year], 67 y.o.   MRN: [id number]  Kevin Perry is a 67 y.o. male presenting on 01/23/2018 for Cough (chest congestion, productive cough, hoarseness x 1 week. Pt currently taking Mucinex  )   HPI URI symptoms Cough, chest congestion, hoarseness.  Symptom onset occurred 5 days ago.  Also has associated rhinorrhea. - No ear symptoms - Sinus pressure mild, but is not new to patient. - Malaise - Pt denies fever, chills, sweats, nausea, vomiting, diarrhea and constipation.  Is currently taking Mucinex with some assistance for productive cough. - Has had sick contact wife with improvement.  She required antibiotics for improvement.  Social History   Tobacco Use  . Smoking status: Former Smoker    Last attempt to quit: 08/16/2004    Years since quitting: 13.4  . Smokeless tobacco: Former Systems developer  . Tobacco comment: quit 20 plus years  Substance Use Topics  . Alcohol use: No    Comment: SOCIAL  . Drug use: No    Review of Systems Per HPI unless specifically indicated above     Objective:    BP 136/61 (BP Location: Right Arm, Patient Position: Sitting, Cuff Size: Large)   Pulse 65   Temp 98.1 F (36.7 C) (Oral)   Resp 18   Ht 6\' 4"  (1.93 m)   Wt 275 lb 3.2 oz (124.8 kg)   SpO2 96%   BMI 33.50 kg/m   Wt Readings from Last 3 Encounters:  01/23/18 275 lb 3.2 oz (124.8 kg)  12/31/17 274 lb 9.6 oz (124.6 kg)  04/24/17 275 lb (124.7 kg)    Physical Exam Vitals signs reviewed.  Constitutional:      Appearance: He is well-developed.  HENT:     Head: Normocephalic and atraumatic.     Right Ear: Hearing, tympanic membrane, ear canal and external ear normal.     Left Ear: Hearing, tympanic membrane, ear canal and external ear normal.     Nose: Mucosal edema and rhinorrhea present.     Right Sinus: Maxillary sinus tenderness present. No frontal sinus tenderness.     Left Sinus: Maxillary sinus tenderness present.  No frontal sinus tenderness.     Mouth/Throat:     Lips: Pink.     Mouth: Mucous membranes are moist.     Pharynx: Uvula midline. No pharyngeal swelling, oropharyngeal exudate (clear secretions), posterior oropharyngeal erythema or uvula swelling.     Tonsils: No tonsillar exudate. Swelling: 1+ on the right. 1+ on the left.  Eyes:     General: Lids are normal.        Right eye: No discharge.        Left eye: No discharge.     Conjunctiva/sclera: Conjunctivae normal.     Pupils: Pupils are equal, round, and reactive to light.  Neck:     Musculoskeletal: Full passive range of motion without pain, normal range of motion and neck supple.  Cardiovascular:     Rate and Rhythm: Normal rate and regular rhythm.     Pulses: Normal pulses.     Heart sounds: Normal heart sounds, S1 normal and S2 normal.  Pulmonary:     Effort: Pulmonary effort is normal. No respiratory distress.     Breath sounds: Normal breath sounds.  Musculoskeletal:     Right lower leg: No edema.     Left lower leg: No edema.  Lymphadenopathy:     Cervical:  Cervical adenopathy present.     Right cervical: Superficial cervical adenopathy present.     Left cervical: Superficial cervical adenopathy present.  Skin:    General: Skin is warm and dry.     Capillary Refill: Capillary refill takes less than 2 seconds.  Neurological:     General: No focal deficit present.     Mental Status: He is alert.     GCS: GCS eye subscore is 4. GCS verbal subscore is 5. GCS motor subscore is 6.  Psychiatric:        Attention and Perception: Attention normal.        Mood and Affect: Mood normal.        Behavior: Behavior normal. Behavior is cooperative.      Results for orders placed or performed in visit on 01/08/18  HM DIABETES EYE EXAM  Result Value Ref Range   HM Diabetic Eye Exam No Retinopathy No Retinopathy      Assessment & Plan:   Problem List Items Addressed This Visit    None    Visit Diagnoses    Acute  rhinosinusitis    -  Primary   Relevant Medications   ipratropium (ATROVENT) 0.06 % nasal spray      Acute illness. Fever responsive to NSAIDs and tylenol.  Symptoms not worsening. Consistent with viral illness x 1 days with known sick contacts and no identifiable focal infections of ears, nose, throat.  Plan: 1. Reassurance, likely self-limited with cough lasting up to few weeks - Start Atrovent nasal spray decongestant 2 sprays each nostril up to 4 times daily for 5-7 days - Continue anti-histamine Loratadine or Cetirizine 10mg  daily,  - also can use Flonase 2 sprays each nostril daily for up to 4-6 weeks - Start Mucinex-DM OTC up to 7-10 days then stop - Patient declined tessalon perles - were not effective in past. 2. Supportive care with nasal saline, warm herbal tea with honey, 3. Improve hydration 4. Tylenol / Motrin PRN fevers 5. Return criteria given   Meds ordered this encounter  Medications  . ipratropium (ATROVENT) 0.06 % nasal spray    Sig: Place 2 sprays into both nostrils 4 (four) times daily for 5 days.    Dispense:  15 mL    Refill:  0    Order Specific Question:   Supervising Provider    Answer:   [name] [[id number]]    Follow up plan: Return 5-7 days if symptoms worsen or fail to improve.  [name], DNP, AGPCNP-BC Adult Gerontology Primary Care Nurse Practitioner [facility] 01/23/2018, 11:49 AM

## 2018-01-26 ENCOUNTER — Encounter: Payer: Self-pay | Admitting: Nurse Practitioner

## 2018-01-26 ENCOUNTER — Telehealth: Payer: Self-pay

## 2018-01-26 NOTE — Telephone Encounter (Signed)
Left message for patient to call back  

## 2018-01-26 NOTE — Telephone Encounter (Signed)
Discussed with [name], AGPCNP-BC since she was last treating provider for this issue. She stated since onset symptoms was recent, and based on duration of symptoms would hold off on antibiotic, if still symptoms he can contact us after Christmas or later this week and likely able to send antibiotic at that time.  [name], [facility] 01/26/2018, 12:47 PM

## 2018-01-26 NOTE — Telephone Encounter (Signed)
Patient was seen Friday by Ander Purpura and was told to call back today if no better to get a abx.  He is still has a bad cough and congestion.

## 2018-01-30 ENCOUNTER — Other Ambulatory Visit: Payer: Self-pay | Admitting: Family Medicine

## 2018-01-30 DIAGNOSIS — I10 Essential (primary) hypertension: Secondary | ICD-10-CM

## 2018-02-03 ENCOUNTER — Telehealth: Payer: Self-pay

## 2018-02-03 DIAGNOSIS — J019 Acute sinusitis, unspecified: Secondary | ICD-10-CM

## 2018-02-03 MED ORDER — AMOXICILLIN-POT CLAVULANATE 875-125 MG PO TABS: 1.0000 | ORAL_TABLET | Freq: Two times a day (BID) | ORAL | 0 refills | Status: AC

## 2018-02-03 NOTE — Telephone Encounter (Signed)
Patient was seen on 01/23/18 and called still complaining  of sinus pain and pressure, coughing and congestion.  He is requesting an abx since symptoms have been going on for over 2 weeks.

## 2018-02-03 NOTE — Telephone Encounter (Signed)
Left detail message. 

## 2018-02-03 NOTE — Telephone Encounter (Signed)
START Augmentin 875-125 mg one tablet every 12 hours for 10 days.  Symptoms > 14 days.  Supports ABRS requiring antibiotics treatment.

## 2018-03-02 ENCOUNTER — Other Ambulatory Visit: Payer: Self-pay | Admitting: Family Medicine

## 2018-03-02 DIAGNOSIS — E1165 Type 2 diabetes mellitus with hyperglycemia: Secondary | ICD-10-CM

## 2018-03-02 DIAGNOSIS — IMO0001 Reserved for inherently not codable concepts without codable children: Secondary | ICD-10-CM

## 2018-03-02 DIAGNOSIS — I10 Essential (primary) hypertension: Secondary | ICD-10-CM

## 2018-03-02 DIAGNOSIS — F32A Depression, unspecified: Secondary | ICD-10-CM

## 2018-03-02 DIAGNOSIS — F329 Major depressive disorder, single episode, unspecified: Secondary | ICD-10-CM

## 2018-03-02 DIAGNOSIS — I2510 Atherosclerotic heart disease of native coronary artery without angina pectoris: Secondary | ICD-10-CM

## 2018-03-27 ENCOUNTER — Other Ambulatory Visit: Payer: Self-pay | Admitting: Family Medicine

## 2018-03-27 DIAGNOSIS — E785 Hyperlipidemia, unspecified: Principal | ICD-10-CM

## 2018-03-27 DIAGNOSIS — I872 Venous insufficiency (chronic) (peripheral): Secondary | ICD-10-CM

## 2018-03-27 DIAGNOSIS — E1169 Type 2 diabetes mellitus with other specified complication: Secondary | ICD-10-CM

## 2018-06-22 DIAGNOSIS — E119 Type 2 diabetes mellitus without complications: Secondary | ICD-10-CM | POA: Diagnosis not present

## 2018-06-22 DIAGNOSIS — D3132 Benign neoplasm of left choroid: Secondary | ICD-10-CM | POA: Diagnosis not present

## 2018-06-22 DIAGNOSIS — H2512 Age-related nuclear cataract, left eye: Secondary | ICD-10-CM | POA: Diagnosis not present

## 2018-07-02 DIAGNOSIS — H2512 Age-related nuclear cataract, left eye: Secondary | ICD-10-CM | POA: Diagnosis not present

## 2018-07-02 DIAGNOSIS — I10 Essential (primary) hypertension: Secondary | ICD-10-CM | POA: Diagnosis not present

## 2018-07-02 DIAGNOSIS — Z7984 Long term (current) use of oral hypoglycemic drugs: Secondary | ICD-10-CM | POA: Diagnosis not present

## 2018-07-02 DIAGNOSIS — Z87891 Personal history of nicotine dependence: Secondary | ICD-10-CM | POA: Diagnosis not present

## 2018-07-02 DIAGNOSIS — I252 Old myocardial infarction: Secondary | ICD-10-CM | POA: Diagnosis not present

## 2018-07-02 DIAGNOSIS — E1136 Type 2 diabetes mellitus with diabetic cataract: Secondary | ICD-10-CM | POA: Diagnosis not present

## 2018-07-02 DIAGNOSIS — H25812 Combined forms of age-related cataract, left eye: Secondary | ICD-10-CM | POA: Diagnosis not present

## 2018-07-02 DIAGNOSIS — I2510 Atherosclerotic heart disease of native coronary artery without angina pectoris: Secondary | ICD-10-CM | POA: Diagnosis not present

## 2018-07-02 DIAGNOSIS — E785 Hyperlipidemia, unspecified: Secondary | ICD-10-CM | POA: Diagnosis not present

## 2018-07-02 DIAGNOSIS — E1165 Type 2 diabetes mellitus with hyperglycemia: Secondary | ICD-10-CM | POA: Diagnosis not present

## 2018-07-03 ENCOUNTER — Ambulatory Visit: Payer: Managed Care, Other (non HMO) | Admitting: Family Medicine

## 2018-07-09 DIAGNOSIS — H2511 Age-related nuclear cataract, right eye: Secondary | ICD-10-CM | POA: Diagnosis not present

## 2018-07-14 ENCOUNTER — Other Ambulatory Visit: Payer: Self-pay | Admitting: Family Medicine

## 2018-07-14 DIAGNOSIS — E1169 Type 2 diabetes mellitus with other specified complication: Secondary | ICD-10-CM

## 2018-07-15 LAB — HEMOGLOBIN A1C
Hgb A1c MFr Bld: 8.4 % of total Hgb — ABNORMAL HIGH (ref <5.7)
Mean Plasma Glucose: 194 (calc)
eAG (mmol/L): 10.8 (calc)

## 2018-07-17 ENCOUNTER — Encounter: Payer: Self-pay | Admitting: Family Medicine

## 2018-07-17 ENCOUNTER — Ambulatory Visit (INDEPENDENT_AMBULATORY_CARE_PROVIDER_SITE_OTHER): Payer: Managed Care, Other (non HMO) | Admitting: Family Medicine

## 2018-07-17 ENCOUNTER — Other Ambulatory Visit: Payer: Self-pay

## 2018-07-17 VITALS — BP 145/69 | HR 64 | Temp 98.2°F | Ht 76.0 in | Wt 273.0 lb

## 2018-07-17 DIAGNOSIS — M19042 Primary osteoarthritis, left hand: Secondary | ICD-10-CM

## 2018-07-17 DIAGNOSIS — M19041 Primary osteoarthritis, right hand: Secondary | ICD-10-CM | POA: Diagnosis not present

## 2018-07-17 DIAGNOSIS — E1169 Type 2 diabetes mellitus with other specified complication: Secondary | ICD-10-CM

## 2018-07-17 MED ORDER — DICLOFENAC SODIUM 1 % TD GEL: 2.0000 g | Freq: Three times a day (TID) | TRANSDERMAL | 2 refills | Status: DC | PRN

## 2018-07-17 MED ORDER — METFORMIN HCL 500 MG PO TABS: ORAL_TABLET | ORAL | 1 refills | Status: DC

## 2018-07-17 NOTE — Assessment & Plan Note (Signed)
Elevated A1c up to 8.4 from 8.1, still increasing A1c, poor DM control, some lifestyle attributed Complicated by Hyperglycemia, Hyperlipidemia, Mood, Cataract (s/p surgery) - increase risk of cardiovascular abnormality w/ known CAD - Prior Meds: Onglyza (ins/cost), Metformin XR 750 (GI intolerance)  Plan:  1. SWITCH off Metformin XR - less effective at low dose, and due to recall - switch to Metformin 500mg  IR BID dosing, then in future can increase up to 1 AM / 2 PM dosing - CONTINUE current meds - Trulicity 1.5mg  weekly [place] inj, GLipizide 10mg  daily - Counseling on risk hypoglycemia on sulfonylurea 2. Encourage improved lifestyle - low carb, low sugar diet, reduce portion size, goal for regular balanced 3 x daily meal instead of skipping lunch, continue improving regular exercise 3. Check CBG, bring log to next visit for review - check 3x weekly 4. Continue ASA, ACEi, Statin 5 Follow-up 3 months DM A1c

## 2018-07-17 NOTE — Patient Instructions (Addendum)
Thank you for coming to the office today.  Recent Labs    12/30/17 1028 07/14/18 1004  HGBA1C 8.1* 8.4*   Increased Metformin now to 12 hour pill, not 24 hour - also due to recall Take 1 in AM and 1 in PM with meals, eventually increase to 2 in PM  Try diclofenac topical for finger joint arthritis  Check with Trulicity cost for Health Team Advt - can do samples or call our case management team for financial asst  If hand cream is not helping, in 1 month let me know, can try Gabapentin or Meloxicam  Please schedule a Follow-up Appointment to: Return in about 3 months (around 10/17/2018) for 3 months DM A1c, Arthritis hands.  If you have any other questions or concerns, please feel free to call the office or send a message through [place]. You may also schedule an earlier appointment if necessary.  Additionally, you may be receiving a survey about your experience at our office within a few days to 1 week by e-mail or mail. We value your feedback.  [name], DO [place]

## 2018-07-17 NOTE — Assessment & Plan Note (Signed)
Clinically consistent with OA/DJD with multiple finger joints with deformity and nodules Chronic gradual worsening problem Failed oral nsaids otc, has naproxen anaphylactic allergy but tolerates ibuprofen oral No recent imaging x-ray  Plan - Trial on topical Diclofenac gel TID PRN, should be covered, otherwise printed goodrx coupon, let us know if need new [facility] - continue tylenol and conservative care - may consider next if not improved, gabapentin vs meloxicam rx

## 2018-07-17 NOTE — Progress Notes (Signed)
Subjective:    Patient ID: Kevin Perry, male    DOB: 05/01/1950, 68 y.o.   MRN: [id number]  Kevin Perry is a 68 y.o. male presenting on 07/17/2018 for Diabetes   HPI   CHRONIC DM, Type 2 with Hyperglycemia: Last visit 12/2017 - Today no concerns except still has persistent elevated fasting AM CBG. He admits if eats late night snack. -CBGs: Avg AM reading160-180s. Often night snack when home late from work Meds: Trulicity1.5mg  weekly, Glipizide 10mg  daily, Metformin XR 500mg  daily - High cost of other GLP1, he will change to HTA insurance when retire 08/06/18, he says would be in donut hole if continues trulicity Reports good compliance. Tolerating well w/o side-effects Currently on ACEi Lifestyle: - Diet (trying to improve diet, not always adhering to low carb, often eats late snack) - Exercise (walkregularlyat hospitalwhile working) - Last ophtho eye [facility] s/p cataract surgery Left eye 2 weeks ago, will do R one next Denies hypoglycemia, polyuria, visual changes, numbness or tingling.  Bilateral Hand / Finger Pain, osteoarthritis Chronic problem, gradual worsening arthritis and nodules of fingers, has had some issues with pain and stiffness within joints of hands/fingers. Using Advil in past. He has severe naproxen allergy but he is able to tolerate oral advil PRN rarely uses. Tried OTC topical without relief. Asking for further treatment options.    Depression screen [facility] 2/9 07/17/2018 12/31/2017 04/24/2017  Decreased Interest 0 0 0  Down, Depressed, Hopeless 0 0 0  PHQ - 2 Score 0 0 0  Altered sleeping 0 0 0  Tired, decreased energy 0 0 0  Change in appetite 0 0 0  Feeling bad or failure about yourself  0 0 0  Trouble concentrating 0 0 0  Moving slowly or fidgety/restless 0 0 0  Suicidal thoughts 0 0 0  PHQ-9 Score 0 0 0  Difficult doing work/chores Not difficult at all Not difficult at all Not difficult at all    Social History    Tobacco Use  . Smoking status: Former Smoker    Quit date: 08/16/2004    Years since quitting: 13.9  . Smokeless tobacco: Former Systems developer  . Tobacco comment: quit 20 plus years  Substance Use Topics  . Alcohol use: No    Comment: SOCIAL  . Drug use: No    Review of Systems Per HPI unless specifically indicated above     Objective:    BP (!) 145/69 (BP Location: Left Arm, Patient Position: Sitting, Cuff Size: Large)   Pulse 64   Temp 98.2 F (36.8 C) (Oral)   Ht 6\' 4"  (1.93 m)   Wt 273 lb (123.8 kg)   BMI 33.23 kg/m   Wt Readings from Last 3 Encounters:  07/17/18 273 lb (123.8 kg)  01/23/18 275 lb 3.2 oz (124.8 kg)  12/31/17 274 lb 9.6 oz (124.6 kg)    Physical Exam Vitals signs and nursing note reviewed.  Constitutional:      General: He is not in acute distress.    Appearance: He is well-developed. He is not diaphoretic.     Comments: Well-appearing, comfortable, cooperative  HENT:     Head: Normocephalic and atraumatic.  Eyes:     General:        Right eye: No discharge.        Left eye: No discharge.     Conjunctiva/sclera: Conjunctivae normal.  Neck:     Musculoskeletal: Normal range of motion and neck supple.  Thyroid: No thyromegaly.  Cardiovascular:     Rate and Rhythm: Normal rate and regular rhythm.     Heart sounds: Normal heart sounds. No murmur.  Pulmonary:     Effort: Pulmonary effort is normal. No respiratory distress.     Breath sounds: Normal breath sounds. No wheezing or rales.  Musculoskeletal: Normal range of motion.     Comments: Bilateral Hands Notable deformity in some fingers R index and middle, and Left index, with DIP joint having nodules and deviation. Some MCP bulkiness. No erythema or edema  Palpation: Mild tender fingers in places over joints, otherwise non tender wrist, carpal bones, including MCP, base of thumb. No distinct anatomical snuff box or scaphoid tenderness.  ROM: full active wrist ROM flex / ext, ulnar / radial  deviation Strength: 5/5 grip, thumb opposition, wrist flex/ext Neurovascular: distally intact'  Lymphadenopathy:     Cervical: No cervical adenopathy.  Skin:    General: Skin is warm and dry.     Findings: No erythema or rash.  Neurological:     Mental Status: He is alert and oriented to person, place, and time.  Psychiatric:        Behavior: Behavior normal.     Comments: Well groomed, good eye contact, normal speech and thoughts      Recent Labs    12/30/17 1028 07/14/18 1004  HGBA1C 8.1* 8.4*    Results for orders placed or performed in visit on 07/14/18  Hemoglobin A1c  Result Value Ref Range   Hgb A1c MFr Bld 8.4 (H) <5.7 % of total Hgb   Mean Plasma Glucose 194 (calc)   eAG (mmol/L) 10.8 (calc)      Assessment & Plan:   Problem List Items Addressed This Visit    Bilateral osteoarthritis of finger    Clinically consistent with OA/DJD with multiple finger joints with deformity and nodules Chronic gradual worsening problem Failed oral nsaids otc, has naproxen anaphylactic allergy but tolerates ibuprofen oral No recent imaging x-ray  Plan - Trial on topical Diclofenac gel TID PRN, should be covered, otherwise printed goodrx coupon, let us know if need [facility] - continue tylenol and conservative care - may consider next if not improved, gabapentin vs meloxicam rx      Relevant Medications   diclofenac sodium (VOLTAREN) 1 % GEL   Type 2 diabetes mellitus with other specified complication (HCC) - Primary    Elevated A1c up to 8.4 from 8.1, still increasing A1c, poor DM control, some lifestyle attributed Complicated by Hyperglycemia, Hyperlipidemia, Mood, Cataract (s/p surgery) - increase risk of cardiovascular abnormality w/ known CAD - Prior Meds: Onglyza (ins/cost), Metformin XR 750 (GI intolerance)  Plan:  1. SWITCH off Metformin XR - less effective at low dose, and due to recall - switch to Metformin 500mg  IR BID dosing, then in future can  increase up to 1 AM / 2 PM dosing - CONTINUE current meds - Trulicity 1.5mg  weekly [place] inj, GLipizide 10mg  daily - Counseling on risk hypoglycemia on sulfonylurea 2. Encourage improved lifestyle - low carb, low sugar diet, reduce portion size, goal for regular balanced 3 x daily meal instead of skipping lunch, continue improving regular exercise 3. Check CBG, bring log to next visit for review - check 3x weekly 4. Continue ASA, ACEi, Statin 5 Follow-up 3 months DM A1c      Relevant Medications   metFORMIN (GLUCOPHAGE) 500 MG tablet       Meds ordered this encounter  Medications  . metFORMIN (GLUCOPHAGE) 500 MG tablet    Sig: Take 1 tablet (500mg ) twice daily with food, can increase to 2 tabs (1000mg  dose) with evening meal.    Dispense:  270 tablet    Refill:  1    Change from XR to IR, new dosage  . diclofenac sodium (VOLTAREN) 1 % GEL    Sig: Apply 2 g topically 3 (three) times daily as needed. For hand arthritis    Dispense:  100 g    Refill:  2     Follow up plan: Return in about 3 months (around 10/17/2018) for 3 months DM A1c, Arthritis hands.  [name], [facility] 07/17/2018, 1:29 PM

## 2018-07-23 DIAGNOSIS — E669 Obesity, unspecified: Secondary | ICD-10-CM | POA: Diagnosis not present

## 2018-07-23 DIAGNOSIS — H2511 Age-related nuclear cataract, right eye: Secondary | ICD-10-CM | POA: Diagnosis not present

## 2018-07-23 DIAGNOSIS — Z79899 Other long term (current) drug therapy: Secondary | ICD-10-CM | POA: Diagnosis not present

## 2018-07-23 DIAGNOSIS — Z955 Presence of coronary angioplasty implant and graft: Secondary | ICD-10-CM | POA: Diagnosis not present

## 2018-07-23 DIAGNOSIS — Z6832 Body mass index (BMI) 32.0-32.9, adult: Secondary | ICD-10-CM | POA: Diagnosis not present

## 2018-07-23 DIAGNOSIS — H25811 Combined forms of age-related cataract, right eye: Secondary | ICD-10-CM | POA: Diagnosis not present

## 2018-07-23 DIAGNOSIS — Z886 Allergy status to analgesic agent status: Secondary | ICD-10-CM | POA: Diagnosis not present

## 2018-07-23 DIAGNOSIS — I10 Essential (primary) hypertension: Secondary | ICD-10-CM | POA: Diagnosis not present

## 2018-07-23 DIAGNOSIS — E1136 Type 2 diabetes mellitus with diabetic cataract: Secondary | ICD-10-CM | POA: Diagnosis not present

## 2018-07-23 DIAGNOSIS — I252 Old myocardial infarction: Secondary | ICD-10-CM | POA: Diagnosis not present

## 2018-07-23 DIAGNOSIS — Z7984 Long term (current) use of oral hypoglycemic drugs: Secondary | ICD-10-CM | POA: Diagnosis not present

## 2018-08-26 ENCOUNTER — Other Ambulatory Visit: Payer: Self-pay | Admitting: Family Medicine

## 2018-08-26 DIAGNOSIS — IMO0001 Reserved for inherently not codable concepts without codable children: Secondary | ICD-10-CM

## 2018-08-27 DIAGNOSIS — D485 Neoplasm of uncertain behavior of skin: Secondary | ICD-10-CM | POA: Diagnosis not present

## 2018-08-27 DIAGNOSIS — L570 Actinic keratosis: Secondary | ICD-10-CM | POA: Diagnosis not present

## 2018-08-27 DIAGNOSIS — L209 Atopic dermatitis, unspecified: Secondary | ICD-10-CM | POA: Diagnosis not present

## 2018-08-27 DIAGNOSIS — Z79899 Other long term (current) drug therapy: Secondary | ICD-10-CM | POA: Diagnosis not present

## 2018-08-27 DIAGNOSIS — L820 Inflamed seborrheic keratosis: Secondary | ICD-10-CM | POA: Diagnosis not present

## 2018-09-18 DIAGNOSIS — H35352 Cystoid macular degeneration, left eye: Secondary | ICD-10-CM | POA: Diagnosis not present

## 2018-10-07 DIAGNOSIS — H35352 Cystoid macular degeneration, left eye: Secondary | ICD-10-CM | POA: Diagnosis not present

## 2018-10-26 ENCOUNTER — Ambulatory Visit: Payer: Medicare Other | Admitting: Family Medicine

## 2018-10-28 ENCOUNTER — Other Ambulatory Visit: Payer: Self-pay | Admitting: Family Medicine

## 2018-10-28 ENCOUNTER — Other Ambulatory Visit: Payer: Self-pay

## 2018-10-28 ENCOUNTER — Ambulatory Visit (INDEPENDENT_AMBULATORY_CARE_PROVIDER_SITE_OTHER): Payer: PPO | Admitting: Family Medicine

## 2018-10-28 ENCOUNTER — Encounter: Payer: Self-pay | Admitting: Family Medicine

## 2018-10-28 VITALS — BP 137/85 | HR 51 | Temp 98.3°F | Resp 16 | Ht 76.0 in | Wt 272.0 lb

## 2018-10-28 DIAGNOSIS — Z23 Encounter for immunization: Secondary | ICD-10-CM | POA: Diagnosis not present

## 2018-10-28 DIAGNOSIS — E1169 Type 2 diabetes mellitus with other specified complication: Secondary | ICD-10-CM

## 2018-10-28 DIAGNOSIS — F325 Major depressive disorder, single episode, in full remission: Secondary | ICD-10-CM

## 2018-10-28 DIAGNOSIS — Z1211 Encounter for screening for malignant neoplasm of colon: Secondary | ICD-10-CM

## 2018-10-28 DIAGNOSIS — R351 Nocturia: Secondary | ICD-10-CM

## 2018-10-28 DIAGNOSIS — E669 Obesity, unspecified: Secondary | ICD-10-CM

## 2018-10-28 DIAGNOSIS — I2510 Atherosclerotic heart disease of native coronary artery without angina pectoris: Secondary | ICD-10-CM

## 2018-10-28 DIAGNOSIS — I10 Essential (primary) hypertension: Secondary | ICD-10-CM

## 2018-10-28 DIAGNOSIS — Z0000 Encounter for general adult medical examination without abnormal findings: Secondary | ICD-10-CM

## 2018-10-28 LAB — POCT GLYCOSYLATED HEMOGLOBIN (HGB A1C): Hemoglobin A1C: 8.1 % — AB (ref 4.0–5.6)

## 2018-10-28 NOTE — Progress Notes (Signed)
Subjective:    Patient ID: Kevin Perry, male    DOB: [birth year], 68 y.o.   MRN: [id number]  Kevin Perry is a 68 y.o. male presenting on 10/28/2018 for Diabetes   HPI   CHRONIC DM, Type 2 with Hyperglycemia: - Last visit with me 07/17/18, for visit for same problem diabetes, treated with increased metformin from 500 daily up to BID, see prior notes for background information. - Interval update with now he is doing well on Metformin BID now from previous daily and also now back to walking about 1 month ago restarted this regular exercise - Today patient reports no other concerns, he feels better overall -CBGs: Avg AM reading140 to 150 avg, previously up to [id number] Meds: Trulicity1.5mg  weekly, Glipizide 10mg  daily, Metformin XR 500mg  BID - High cost of other GLP1, he will change to HTA insurance when retire 08/06/18, he says would be in donut hole if continues trulicity Reports good compliance. Tolerating well w/o side-effects Currently on ACEi Lifestyle: - Diet (trying to improve diet, switches breakfast to cereal every other day, other days has larger cooked breakfast) - Exercise (walkregularlyat hospitalwhile working) - Last ophtho eye [facility] s/p cataract surgery - next eye exam 12/2018 Denies hypoglycemia, polyuria, visual changes, numbness or tingling.  Major Depression, in remission He denies any active depression symptoms at this time. Doing well on Escitalopram.  Health Maintenance: Due for Flu Shot, will receive today   Due for Colon CA screening. Last cologuard negative 04/2015, request repeat.  Depression screen [facility] 2/9 10/28/2018 07/17/2018 12/31/2017  Decreased Interest 0 0 0  Down, Depressed, Hopeless 0 0 0  PHQ - 2 Score 0 0 0  Altered sleeping 0 0 0  Tired, decreased energy 0 0 0  Change in appetite 0 0 0  Feeling bad or failure about yourself  0 0 0  Trouble concentrating 0 0 0  Moving slowly or fidgety/restless 0 0 0   Suicidal thoughts 0 0 0  PHQ-9 Score 0 0 0  Difficult doing work/chores Not difficult at all Not difficult at all Not difficult at all    Social History   Tobacco Use  . Smoking status: Former Smoker    Quit date: 08/16/2004    Years since quitting: 14.2  . Smokeless tobacco: Former Systems developer  . Tobacco comment: quit 20 plus years  Substance Use Topics  . Alcohol use: No    Comment: SOCIAL  . Drug use: No    Review of Systems Per HPI unless specifically indicated above     Objective:    BP 137/85   Pulse (!) 51   Temp 98.3 F (36.8 C) (Oral)   Resp 16   Ht 6\' 4"  (1.93 m)   Wt 272 lb (123.4 kg)   BMI 33.11 kg/m   Wt Readings from Last 3 Encounters:  10/28/18 272 lb (123.4 kg)  07/17/18 273 lb (123.8 kg)  01/23/18 275 lb 3.2 oz (124.8 kg)    Physical Exam Vitals signs and nursing note reviewed.  Constitutional:      General: He is not in acute distress.    Appearance: He is well-developed. He is not diaphoretic.     Comments: Well-appearing, comfortable, cooperative  HENT:     Head: Normocephalic and atraumatic.  Eyes:     General:        Right eye: No discharge.        Left eye: No discharge.     Conjunctiva/sclera:  Conjunctivae normal.  Neck:     Musculoskeletal: Normal range of motion and neck supple.     Thyroid: No thyromegaly.  Cardiovascular:     Rate and Rhythm: Normal rate and regular rhythm.     Heart sounds: Normal heart sounds. No murmur.  Pulmonary:     Effort: Pulmonary effort is normal. No respiratory distress.     Breath sounds: Normal breath sounds. No wheezing or rales.  Musculoskeletal: Normal range of motion.  Lymphadenopathy:     Cervical: No cervical adenopathy.  Skin:    General: Skin is warm and dry.     Findings: No erythema or rash.  Neurological:     Mental Status: He is alert and oriented to person, place, and time.  Psychiatric:        Behavior: Behavior normal.     Comments: Well groomed, good eye contact, normal speech and  thoughts      Diabetic Foot Exam - Simple   Simple Foot Form Diabetic Foot exam was performed with the following findings: Yes 10/28/2018  2:50 PM  Visual Inspection See comments: Yes Sensation Testing Intact to touch and monofilament testing bilaterally: Yes Pulse Check Posterior Tibialis and Dorsalis pulse intact bilaterally: Yes Comments R foot s/p 2nd toe amputation long healed. Left food s/p distal ray amputation middle toe. Callus formation multiple areas, some thicker toenails. Intact monofilament sensation.    Recent Labs    12/30/17 1028 07/14/18 1004 10/28/18 1441  HGBA1C 8.1* 8.4* 8.1*     Results for orders placed or performed in visit on 10/28/18  POCT HgB A1C  Result Value Ref Range   Hemoglobin A1C 8.1 (A) 4.0 - 5.6 %      Assessment & Plan:   Problem List Items Addressed This Visit    Major depression in remission ([place])    Stable, controlled in remission on SSRI Continue current therapy      Type 2 diabetes mellitus with other specified complication ([place]) - Primary    Improved A1c from 8.4 down to 8.1 on lifestyle improve and inc metformin Complicated by Hyperglycemia, Hyperlipidemia, Mood, Cataract (s/p surgery) - increase risk of cardiovascular abnormality w/ known CAD - Prior Meds: Onglyza (ins/cost), Metformin XR 750 (GI intolerance)  Plan:  1. Keep on metformin IR 500 BID - can increase next if ready to 1000mg  AM and 500mg  PM - max dose is 1000mg  BID, notify if need new rx - CONTINUE current meds - Trulicity 1.5mg  weekly [place] inj, GLipizide 10mg  daily - Counseling on risk hypoglycemia on sulfonylurea 2. Encourage improved lifestyle - low carb, low sugar diet, reduce portion size, goal for regular balanced 3 x daily meal instead of skipping lunch, continue improving regular exercise 3. Check CBG, bring log to next visit for review - check 3x weekly 4. Continue ASA, ACEi, Statin - DM Foot done today 5 Follow-up 3 months physical      Relevant  Orders   POCT HgB A1C (Completed)    Other Visit Diagnoses    Needs flu shot       Relevant Orders   Flu Vaccine QUAD High Dose(Fluad) (Completed)   Screening for colon cancer       Relevant Orders   Cologuard      No orders of the defined types were placed in this encounter.    Follow up plan: Return in about 3 months (around 01/27/2019) for 3 months Annual Physical.  Future labs ordered for 01/27/19 approx  [name], DO  [facility] 10/28/2018, 2:39 PM

## 2018-10-28 NOTE — Assessment & Plan Note (Signed)
Stable, controlled in remission on SSRI Continue current therapy

## 2018-10-28 NOTE — Assessment & Plan Note (Signed)
Improved A1c from 8.4 down to 8.1 on lifestyle improve and inc metformin Complicated by Hyperglycemia, Hyperlipidemia, Mood, Cataract (s/p surgery) - increase risk of cardiovascular abnormality w/ known CAD - Prior Meds: Onglyza (ins/cost), Metformin XR 750 (GI intolerance)  Plan:  1. Keep on metformin IR 500 BID - can increase next if ready to 1000mg  AM and 500mg  PM - max dose is 1000mg  BID, notify if need new rx - CONTINUE current meds - Trulicity 1.5mg  weekly [place] inj, GLipizide 10mg  daily - Counseling on risk hypoglycemia on sulfonylurea 2. Encourage improved lifestyle - low carb, low sugar diet, reduce portion size, goal for regular balanced 3 x daily meal instead of skipping lunch, continue improving regular exercise 3. Check CBG, bring log to next visit for review - check 3x weekly 4. Continue ASA, ACEi, Statin - DM Foot done today 5 Follow-up 3 months physical

## 2018-10-28 NOTE — Patient Instructions (Addendum)
Thank you for coming to the office today.  Recent Labs    12/30/17 1028 07/14/18 1004 10/28/18 1441  HGBA1C 8.1* 8.4* 8.1*   Keep up the good work  Metformin can be increased if you need it, can do 2 in morning and 1 in evening, maximum is 2 twice a day.  Keep on walking with exercise regimen.  DUE for FASTING BLOOD WORK (no food or drink after midnight before the lab appointment, only water or coffee without cream/sugar on the morning of)  SCHEDULE "Lab Only" visit in the morning at the clinic for lab draw in 3 MONTHS   - Make sure Lab Only appointment is at about 1 week before your next appointment, so that results will be available  For Lab Results, once available within 2-3 days of blood draw, you can can log in to MyChart online to view your results and a brief explanation. Also, we can discuss results at next follow-up visit.    Please schedule a Follow-up Appointment to: Return in about 3 months (around 01/27/2019) for 3 months Annual Physical.  If you have any other questions or concerns, please feel free to call the office or send a message through [place]. You may also schedule an earlier appointment if necessary.  Additionally, you may be receiving a survey about your experience at our office within a few days to 1 week by e-mail or mail. We value your feedback.  Kevin Putnam, DO [place]

## 2018-11-04 DIAGNOSIS — H35352 Cystoid macular degeneration, left eye: Secondary | ICD-10-CM | POA: Diagnosis not present

## 2018-11-04 DIAGNOSIS — Z961 Presence of intraocular lens: Secondary | ICD-10-CM | POA: Diagnosis not present

## 2018-11-04 LAB — HM DIABETES EYE EXAM

## 2018-11-19 DIAGNOSIS — Z1211 Encounter for screening for malignant neoplasm of colon: Secondary | ICD-10-CM | POA: Diagnosis not present

## 2018-11-19 DIAGNOSIS — Z1212 Encounter for screening for malignant neoplasm of rectum: Secondary | ICD-10-CM | POA: Diagnosis not present

## 2018-11-27 ENCOUNTER — Encounter: Payer: Self-pay | Admitting: Family Medicine

## 2018-11-27 LAB — COLOGUARD: Cologuard: NEGATIVE

## 2018-12-09 DIAGNOSIS — H35352 Cystoid macular degeneration, left eye: Secondary | ICD-10-CM | POA: Diagnosis not present

## 2018-12-09 DIAGNOSIS — D3132 Benign neoplasm of left choroid: Secondary | ICD-10-CM | POA: Diagnosis not present

## 2018-12-09 DIAGNOSIS — Z961 Presence of intraocular lens: Secondary | ICD-10-CM | POA: Diagnosis not present

## 2018-12-09 DIAGNOSIS — E119 Type 2 diabetes mellitus without complications: Secondary | ICD-10-CM | POA: Diagnosis not present

## 2018-12-20 DIAGNOSIS — Z20828 Contact with and (suspected) exposure to other viral communicable diseases: Secondary | ICD-10-CM | POA: Diagnosis not present

## 2018-12-25 ENCOUNTER — Telehealth: Payer: Self-pay

## 2018-12-25 DIAGNOSIS — R112 Nausea with vomiting, unspecified: Secondary | ICD-10-CM

## 2018-12-25 DIAGNOSIS — U071 COVID-19: Secondary | ICD-10-CM

## 2018-12-25 MED ORDER — ONDANSETRON 4 MG PO TBDP: 4.0000 mg | ORAL_TABLET | Freq: Three times a day (TID) | ORAL | 1 refills | Status: DC | PRN

## 2018-12-25 NOTE — Telephone Encounter (Signed)
Please reach out to review his concerns. I can offer Zofran oral dissolving tablet as needed. If he cannot keep anything down including fluids after trying nausea medicine - he should notify us or seek care sooner.  [name], [facility] 12/25/2018, 3:05 PM

## 2018-12-25 NOTE — Telephone Encounter (Signed)
Left message for Kevin Perry and patient.

## 2018-12-25 NOTE — Telephone Encounter (Signed)
Patient is Coivd positive.  He is currently on day 6 of symptoms.  He is having problems with nausea and vomiting.  Not been able to keep much food down since Tuesday.  Please advise

## 2018-12-28 NOTE — Telephone Encounter (Signed)
Kevin Perry is aware.  

## 2019-01-01 ENCOUNTER — Other Ambulatory Visit: Payer: Self-pay

## 2019-01-01 ENCOUNTER — Encounter: Payer: Self-pay | Admitting: Emergency Medicine

## 2019-01-01 ENCOUNTER — Ambulatory Visit
Admission: EM | Admit: 2019-01-01 | Discharge: 2019-01-01 | Disposition: A | Discharge status: Home / Self Care | Payer: PPO | Attending: Nurse Practitioner | Admitting: Nurse Practitioner

## 2019-01-01 DIAGNOSIS — E860 Dehydration: Secondary | ICD-10-CM | POA: Diagnosis not present

## 2019-01-01 DIAGNOSIS — R112 Nausea with vomiting, unspecified: Secondary | ICD-10-CM | POA: Diagnosis not present

## 2019-01-01 DIAGNOSIS — A0839 Other viral enteritis: Secondary | ICD-10-CM | POA: Diagnosis not present

## 2019-01-01 DIAGNOSIS — R739 Hyperglycemia, unspecified: Secondary | ICD-10-CM | POA: Diagnosis not present

## 2019-01-01 DIAGNOSIS — U071 COVID-19: Secondary | ICD-10-CM | POA: Diagnosis not present

## 2019-01-01 LAB — COMPREHENSIVE METABOLIC PANEL
ALT: 34 U/L (ref 0–44)
AST: 28 U/L (ref 15–41)
Albumin: 3.5 g/dL (ref 3.5–5.0)
Alkaline Phosphatase: 46 U/L (ref 38–126)
Anion gap: 12 (ref 5–15)
BUN: 13 mg/dL (ref 8–23)
CO2: 22 mmol/L (ref 22–32)
Calcium: 8.8 mg/dL — ABNORMAL LOW (ref 8.9–10.3)
Chloride: 99 mmol/L (ref 98–111)
Creatinine, Ser: 0.73 mg/dL (ref 0.61–1.24)
GFR calc Af Amer: >60 mL/min (ref >60)
GFR calc non Af Amer: >60 mL/min (ref >60)
Glucose, Bld: 345 mg/dL — ABNORMAL HIGH (ref 70–99)
Potassium: 3.8 mmol/L (ref 3.5–5.1)
Sodium: 133 mmol/L — ABNORMAL LOW (ref 135–145)
Total Bilirubin: 1.2 mg/dL (ref 0.3–1.2)
Total Protein: 7.3 g/dL (ref 6.5–8.1)

## 2019-01-01 MED ORDER — SODIUM CHLORIDE 0.9 % IV SOLN
Freq: Once | INTRAVENOUS | Status: AC
  Administered 2019-01-01: 15:00:00 via INTRAVENOUS

## 2019-01-01 MED ORDER — PROMETHAZINE HCL 25 MG RE SUPP: 25.0000 mg | Freq: Four times a day (QID) | RECTAL | 0 refills | Status: DC | PRN

## 2019-01-01 MED ORDER — ONDANSETRON 8 MG PO TBDP: 8.0000 mg | ORAL_TABLET | Freq: Three times a day (TID) | ORAL | 0 refills | Status: DC | PRN

## 2019-01-01 MED ORDER — ONDANSETRON HCL 4 MG/2ML IJ SOLN
4.0000 mg | Freq: Once | INTRAMUSCULAR | Status: AC
  Administered 2019-01-01: 4 mg via INTRAVENOUS

## 2019-01-01 NOTE — ED Triage Notes (Signed)
Pt states that he tested positive for covid about 12/19/18. He was off quarantine 2 days ago. He states that he is feeling better but needs fluids because he still can not eat or drink anything. He states his  PCP sent him here for fluids

## 2019-01-01 NOTE — ED Provider Notes (Signed)
[facility]    CSN: [id number] Arrival date & time: 01/01/19  1255      History   Chief Complaint Chief Complaint  Patient presents with  . Covid positive  . Emesis    HPI Kevin Perry is a 68 y.o. male.   Subjective:   Kevin Perry is a 68 y.o. male who presents for evaluation of nausea and vomiting. Patient tested positive for COVID-19 on 11/14. He has been off of isolation for the past couple of days per CDC/health department guidelines. Patient states that he overall feels better but still has some generalized weakness which is what he attributes to his inability to keep down solids. He is drinking ok but can't even keep down toast. Patient describes nausea as severe. Patient denies alcohol overuse, fever, hematemesis or melena. Symptoms have waxed and waned.  He has had virtual visits with his PCP. Phenergan not helpful with his symptoms. He was told to come here for IV fluids by his PCP.  The following portions of the patient's history were reviewed and updated as appropriate: allergies, current medications, past family history, past medical history, past social history, past surgical history and problem list.         Past Medical History:  Diagnosis Date  . Anxiety   . Arthritis   . Myocardial infarction [facility]) 2008    Patient Active Problem List   Diagnosis Date Noted  . Bilateral osteoarthritis of finger 07/17/2018  . Obesity (BMI 30.0-34.9) 12/31/2017  . Coronary artery disease involving native coronary artery without angina pectoris 03/09/2015  . Essential hypertension 09/29/2014  . Type 2 diabetes mellitus with other specified complication ([place]) 79/39/0300  . Major depression in remission ([place]) 09/29/2014  . Hyperlipidemia associated with type 2 diabetes mellitus ([place]) 09/29/2014  . Edema of both lower extremities due to peripheral venous insufficiency 09/29/2014  . Anxiety 09/20/2014  . Primary osteoarthritis of knee 08/30/2014     Past Surgical History:  Procedure Laterality Date  . CHOLECYSTECTOMY    . CORONARY ANGIOPLASTY WITH STENT PLACEMENT N/A 2008  . FOOT SURGERY Left    DOS 8.1.14 HALLIX IPJ FUSION, SECOND MET OSTEOTOMY W/SCREW, HAM TOE REPAIR 2,,4 , PARTIAL AMP 3RD DIGIT   . GALLBLADDER SURGERY    . KNEE SURERY Right   . TOTAL KNEE ARTHROPLASTY Right 08/30/2014   Procedure: TOTAL KNEE ARTHROPLASTY;  Surgeon: [name], MD;  Location: [facility] ORS;  Service: Orthopedics;  Laterality: Right;       Home Medications    Prior to Admission medications   Medication Sig Start Date End Date Taking? Authorizing Provider  amLODipine (NORVASC) 10 MG tablet TAKE 1 TABLET BY MOUTH ONCE DAILY 01/30/18  Yes Karamalegos, [name], DO  aspirin 81 MG tablet Take 81 mg by mouth daily.   Yes [provider]  escitalopram (LEXAPRO) 20 MG tablet TAKE 1 TABLET BY MOUTH AT BEDTIME. 03/02/18  Yes Karamalegos, Alexander J, DO  fluticasone (FLONASE) 50 MCG/ACT nasal spray Place 2 sprays into both nostrils daily. Use for 4-6 weeks then stop and use seasonally or as needed. 11/21/16  Yes Karamalegos, [name], DO  folic acid (FOLVITE) 1 MG tablet  08/27/16  Yes [provider]  furosemide (LASIX) 40 MG tablet TAKE 1 TABLET BY MOUTH ONCE DAILY AS NEEDED. 03/27/18  Yes Karamalegos, Alexander J, DO  glipiZIDE (GLUCOTROL) 10 MG tablet TAKE 1 TABLET BY MOUTH ONCE DAILY WITH BREAKFAST 08/26/18  Yes Karamalegos, [name], DO  Insulin  Pen Needle 31G X 5 MM MISC 1 each by Does not apply route daily. 06/13/15  Yes Krebs, Amy Lauren, NP  lisinopril (PRINIVIL,ZESTRIL) 20 MG tablet TAKE 1 TABLET BY MOUTH ONCE EVERY MORNING. 03/02/18  Yes Karamalegos, [name], DO  loratadine (CLARITIN) 10 MG tablet Take 10 mg by mouth every morning.   Yes [provider]  metFORMIN (GLUCOPHAGE) 500 MG tablet Take 1 tablet (552m) twice daily with food, can increase to 2 tabs (10013mdose) with evening meal. 07/17/18  Yes Karamalegos,  AlDevonne DoughtyDO  methotrexate (RHEUMATREX) 2.5 MG tablet  08/15/16  Yes [provider]  metoprolol succinate (TOPROL-XL) 50 MG 24 hr tablet TAKE 1 TABLET BY MOUTH ONCE DAILY. 03/02/18  Yes Karamalegos, AlDevonne DoughtyDO  rosuvastatin (CRESTOR) 10 MG tablet TAKE 1 TABLET BY MOUTH ONCE EVERY MORNING. 03/27/18  Yes Karamalegos, AlDevonne DoughtyDO  TRULICITY 1.5 MGMK/3.4JZOPN INJECT 1.5MG INTO SKIN ONCE WEEKLY 08/26/18  Yes Karamalegos, AlDevonne DoughtyDO  albuterol (PROVENTIL HFA;VENTOLIN HFA) 108 (90 Base) MCG/ACT inhaler Inhale 2 puffs into the lungs every 4 (four) hours as needed for wheezing or shortness of breath (cough). 11/21/16   Karamalegos, AlDevonne DoughtyDO  diclofenac sodium (VOLTAREN) 1 % GEL Apply 2 g topically 3 (three) times daily as needed. For hand arthritis 07/17/18   KaOlin HauserDO  DUREZOL 0.05 % [facility]8/14/20   [provider]  ondansetron (ZOFRAN ODT) 8 MG disintegrating tablet Take 1 tablet (8 mg total) by mouth every 8 (eight) hours as needed for nausea or vomiting. 01/01/19   MuEnrique SackFNP  promethazine (PHENERGAN) 25 MG suppository Place 1 suppository (25 mg total) rectally every 6 (six) hours as needed for nausea or vomiting. 01/01/19   MuEnrique SackFNP    Family History History reviewed. No pertinent family history.  Social History Social History   Tobacco Use  . Smoking status: Former Smoker    Quit date: 08/16/2004    Years since quitting: 14.3  . Smokeless tobacco: Former [employer] developer. Tobacco comment: quit 20 plus years  Substance Use Topics  . Alcohol use: No    Comment: SOCIAL  . Drug use: No     Allergies   Naproxen   Review of Systems Review of Systems  Constitutional: Positive for appetite change and fatigue. Negative for fever.  Respiratory: Negative for cough, chest tightness and shortness of breath.   Cardiovascular: Negative for chest pain.  Gastrointestinal: Positive for nausea and vomiting. Negative for  abdominal pain.  Neurological: Negative for dizziness and headaches.  All other systems reviewed and are negative.    Physical Exam Triage Vital Signs ED Triage Vitals  Enc Vitals Group     BP 01/01/19 1358 (!) 148/87     Pulse Rate 01/01/19 1358 79     Resp 01/01/19 1358 18     Temp 01/01/19 1358 98.1 F (36.7 C)     Temp Source 01/01/19 1358 Oral     SpO2 01/01/19 1358 92 %     Weight 01/01/19 1354 280 lb (127 kg)     Height 01/01/19 1354 6' 3" (1.905 m)     Head Circumference --      Peak Flow --      Pain Score 01/01/19 1354 0     Pain Loc --      Pain Edu? --      Excl. in [place]--    No data found.  Updated Vital Signs  BP (!) 148/87 (BP Location: Right Arm)   Pulse 79   Temp 98.1 F (36.7 C) (Oral)   Resp 18   Ht 6' 3" (1.905 m)   Wt 280 lb (127 kg)   SpO2 92%   BMI 35.00 kg/m   Visual Acuity Right Eye Distance:   Left Eye Distance:   Bilateral Distance:    Right Eye Near:   Left Eye Near:    Bilateral Near:     Physical Exam Vitals signs reviewed.  Constitutional:      General: He is not in acute distress.    Appearance: Normal appearance. He is not ill-appearing, toxic-appearing or diaphoretic.  HENT:     Head: Normocephalic.     Mouth/Throat:     Mouth: Mucous membranes are moist.  Neck:     Musculoskeletal: Normal range of motion and neck supple.  Cardiovascular:     Rate and Rhythm: Normal rate and regular rhythm.  Pulmonary:     Effort: Pulmonary effort is normal.     Breath sounds: Normal breath sounds.  Musculoskeletal: Normal range of motion.  Skin:    General: Skin is warm and dry.  Neurological:     General: No focal deficit present.     Mental Status: He is alert and oriented to person, place, and time.  Psychiatric:        Mood and Affect: Mood normal.        Behavior: Behavior normal.      UC Treatments / Results  Labs (all labs ordered are listed, but only abnormal results are displayed) Labs Reviewed  COMPREHENSIVE  METABOLIC PANEL - Abnormal; Notable for the following components:      Result Value   Sodium 133 (*)    Glucose, Bld 345 (*)    Calcium 8.8 (*)    All other components within normal limits    EKG   Radiology No results found.  Procedures Procedures (including critical care time)  Medications Ordered in UC Medications  0.9 %  sodium chloride infusion ( Intravenous New Bag/Given 01/01/19 1502)  ondansetron (ZOFRAN) injection 4 mg (4 mg Intravenous Given 01/01/19 1501)    Initial Impression / Assessment and Plan / UC Course  I have reviewed the triage vital signs and the nursing notes.  Pertinent labs & imaging results that were available during my care of the patient were reviewed by me and considered in my medical decision making (see chart for details).    68 yo male who tested positive for COVID-19 on 11/14 presents with nausea, vomiting and the inability to tolerate PO. Although he is able to drink liquids he can't keep any solids down. He was sent here for evaluation by his PCP after a virtual visit. Patient is afebrile and nontoxic appearing. CMP shows elevated glucose but remainder of study unremarkable. Patient is diabetic. He received a IVF bolus and Zofran in the clinic. He subjectively feels much better. Will send home with close outpatient follow-up. Patient advised to adhere to a clear liquid diet for the next 12 hours then slowly start PO with bland/soft foods. Phenergan PRN. Follow-up with PCP on Monday. Dietary guidelines discussed. Neurosurgeon distributed. Discussed the diagnosis with the patient.   Today's evaluation has revealed no signs of a dangerous process. Discussed diagnosis with patient and/or guardian. Patient and/or guardian aware of their diagnosis, possible red flag symptoms to watch out for and need for close follow up. Patient and/or guardian understands verbal and written discharge  instructions. Patient and/or guardian comfortable with plan and  disposition.  Patient and/or guardian has a clear mental status at this time, good insight into illness (after discussion and teaching) and has clear judgment to make decisions regarding their care  This care was provided during an unprecedented National Emergency due to the Novel Coronavirus (COVID-19) pandemic. COVID-19 infections and transmission risks place heavy strains on healthcare resources.  As this pandemic evolves, our facility, providers, and staff strive to respond fluidly, to remain operational, and to provide care relative to available resources and information. Outcomes are unpredictable and treatments are without well-defined guidelines. Further, the impact of COVID-19 on all aspects of urgent care, including the impact to patients seeking care for reasons other than COVID-19, is unavoidable during this national emergency. At this time of the global pandemic, management of patients has significantly changed, even for non-COVID positive patients given high local and regional COVID volumes at this time requiring high healthcare system and resource utilization. The standard of care for management of both COVID suspected and non-COVID suspected patients continues to change rapidly at the local, regional, national, and global levels. This patient was worked up and treated to the best available but ever changing evidence and resources available at this current time.   Documentation was completed with the aid of voice recognition software. Transcription may contain typographical errors. Final Clinical Impressions(s) / UC Diagnoses   Final diagnoses:  Dehydration  Nausea and vomiting, intractability of vomiting not specified, unspecified vomiting type  Gastroenteritis due to COVID-19 virus  Hyperglycemia     Discharge Instructions        Clear liquid diet for the next 24 hours    Drink clear fluids in small amounts as you are able. Clear fluids include: ? Water ? Pedialyte  ? Ice  chips ? Fruit juice with water added to it (diluted) ? Low-calorie sports drinks.  Drink enough fluid to keep your pee (urine) pale yellow.  Slowly start a BLAND diet . . . eat small amounts of healthy foods every 3-4 hours as you are able. This may include whole grains, fruits, vegetables, lean meats (see attached)  Avoid fluids that have a lot of sugar or caffeine in them, such as energy drinks, sports drinks, and soda.  Avoid spicy or fatty foods.  Avoid alcohol.  Avoid dairy, fried and greasy foods for at least 3 days   Follow-up with your doctor on Monday     ED Prescriptions    Medication Sig Dispense Auth. Provider   promethazine (PHENERGAN) 25 MG suppository Place 1 suppository (25 mg total) rectally every 6 (six) hours as needed for nausea or vomiting. 12 each Enrique Sack, FNP   ondansetron (ZOFRAN ODT) 8 MG disintegrating tablet Take 1 tablet (8 mg total) by mouth every 8 (eight) hours as needed for nausea or vomiting. 20 tablet Enrique Sack, FNP     PDMP not reviewed this encounter.   Enrique Sack, [place] 01/01/19 1547

## 2019-01-01 NOTE — Discharge Instructions (Signed)
° °  Clear liquid diet for the next 24 hours   Drink clear fluids in small amounts as you are able. Clear fluids include: Water Pedialyte  Ice chips Fruit juice with water added to it (diluted) Low-calorie sports drinks. Drink enough fluid to keep your pee (urine) pale yellow. Slowly start a BLAND diet . . . eat small amounts of healthy foods every 3-4 hours as you are able. This may include whole grains, fruits, vegetables, lean meats (see attached) Avoid fluids that have a lot of sugar or caffeine in them, such as energy drinks, sports drinks, and soda. Avoid spicy or fatty foods. Avoid alcohol. Avoid dairy, fried and greasy foods for at least 3 days  Follow-up with your doctor on Monday

## 2019-01-21 ENCOUNTER — Other Ambulatory Visit: Payer: Self-pay | Admitting: Family Medicine

## 2019-01-21 DIAGNOSIS — I10 Essential (primary) hypertension: Secondary | ICD-10-CM

## 2019-01-22 NOTE — Telephone Encounter (Signed)
Should not be out until End Of January- will leave for Dr. Raliegh Ip

## 2019-02-08 ENCOUNTER — Other Ambulatory Visit: Payer: Self-pay | Admitting: Family Medicine

## 2019-02-08 DIAGNOSIS — I10 Essential (primary) hypertension: Secondary | ICD-10-CM

## 2019-02-11 ENCOUNTER — Other Ambulatory Visit: Payer: Self-pay

## 2019-02-11 DIAGNOSIS — I2510 Atherosclerotic heart disease of native coronary artery without angina pectoris: Secondary | ICD-10-CM | POA: Diagnosis not present

## 2019-02-11 DIAGNOSIS — R351 Nocturia: Secondary | ICD-10-CM

## 2019-02-11 DIAGNOSIS — E1169 Type 2 diabetes mellitus with other specified complication: Secondary | ICD-10-CM | POA: Diagnosis not present

## 2019-02-11 DIAGNOSIS — I10 Essential (primary) hypertension: Secondary | ICD-10-CM

## 2019-02-11 DIAGNOSIS — E785 Hyperlipidemia, unspecified: Secondary | ICD-10-CM | POA: Diagnosis not present

## 2019-02-11 DIAGNOSIS — Z0000 Encounter for general adult medical examination without abnormal findings: Secondary | ICD-10-CM | POA: Diagnosis not present

## 2019-02-12 LAB — CBC WITH DIFFERENTIAL/PLATELET
Absolute Monocytes: 508 cells/uL (ref 200–950)
Basophils Absolute: 81 cells/uL (ref 0–200)
Basophils Relative: 1.3 %
Eosinophils Absolute: 229 cells/uL (ref 15–500)
Eosinophils Relative: 3.7 %
HCT: 44 % (ref 38.5–50.0)
Hemoglobin: 15.2 g/dL (ref 13.2–17.1)
Lymphs Abs: 1810 cells/uL (ref 850–3900)
MCH: 32.1 pg (ref 27.0–33.0)
MCHC: 34.5 g/dL (ref 32.0–36.0)
MCV: 93 fL (ref 80.0–100.0)
MPV: 10.3 fL (ref 7.5–12.5)
Monocytes Relative: 8.2 %
Neutro Abs: 3571 cells/uL (ref 1500–7800)
Neutrophils Relative %: 57.6 %
Platelets: 195 10*3/uL (ref 140–400)
RBC: 4.73 10*6/uL (ref 4.20–5.80)
RDW: 14.4 % (ref 11.0–15.0)
Total Lymphocyte: 29.2 %
WBC: 6.2 10*3/uL (ref 3.8–10.8)

## 2019-02-12 LAB — COMPLETE METABOLIC PANEL WITH GFR
AG Ratio: 2 (calc) (ref 1.0–2.5)
ALT: 53 U/L — ABNORMAL HIGH (ref 9–46)
AST: 43 U/L — ABNORMAL HIGH (ref 10–35)
Albumin: 4.3 g/dL (ref 3.6–5.1)
Alkaline phosphatase (APISO): 45 U/L (ref 35–144)
BUN: 10 mg/dL (ref 7–25)
CO2: 28 mmol/L (ref 20–32)
Calcium: 9.3 mg/dL (ref 8.6–10.3)
Chloride: 100 mmol/L (ref 98–110)
Creat: 0.82 mg/dL (ref 0.70–1.25)
GFR, Est African American: 105 mL/min/{1.73_m2} (ref >=60)
GFR, Est Non African American: 91 mL/min/{1.73_m2} (ref >=60)
Globulin: 2.2 g/dL (calc) (ref 1.9–3.7)
Glucose, Bld: 233 mg/dL — ABNORMAL HIGH (ref 65–99)
Potassium: 3.5 mmol/L (ref 3.5–5.3)
Sodium: 138 mmol/L (ref 135–146)
Total Bilirubin: 0.9 mg/dL (ref 0.2–1.2)
Total Protein: 6.5 g/dL (ref 6.1–8.1)

## 2019-02-12 LAB — HEMOGLOBIN A1C
Hgb A1c MFr Bld: 8.5 % of total Hgb — ABNORMAL HIGH (ref <5.7)
Mean Plasma Glucose: 197 (calc)
eAG (mmol/L): 10.9 (calc)

## 2019-02-12 LAB — LIPID PANEL
Cholesterol: 134 mg/dL (ref <200)
HDL: 36 mg/dL — ABNORMAL LOW (ref >=40)
LDL Cholesterol (Calc): 65 mg/dL (calc)
Non-HDL Cholesterol (Calc): 98 mg/dL (calc) (ref <130)
Total CHOL/HDL Ratio: 3.7 (calc) (ref <5.0)
Triglycerides: 278 mg/dL — ABNORMAL HIGH (ref <150)

## 2019-02-12 LAB — PSA: PSA: 0.5 ng/mL (ref <=4.0)

## 2019-02-16 ENCOUNTER — Other Ambulatory Visit: Payer: Self-pay

## 2019-02-16 ENCOUNTER — Encounter: Payer: Self-pay | Admitting: Family Medicine

## 2019-02-16 ENCOUNTER — Ambulatory Visit (INDEPENDENT_AMBULATORY_CARE_PROVIDER_SITE_OTHER): Payer: PPO | Admitting: Family Medicine

## 2019-02-16 VITALS — BP 125/78 | Temp 96.1°F | Ht 76.0 in | Wt 260.0 lb

## 2019-02-16 DIAGNOSIS — E785 Hyperlipidemia, unspecified: Secondary | ICD-10-CM | POA: Diagnosis not present

## 2019-02-16 DIAGNOSIS — I10 Essential (primary) hypertension: Secondary | ICD-10-CM | POA: Diagnosis not present

## 2019-02-16 DIAGNOSIS — E1169 Type 2 diabetes mellitus with other specified complication: Secondary | ICD-10-CM | POA: Diagnosis not present

## 2019-02-16 DIAGNOSIS — E669 Obesity, unspecified: Secondary | ICD-10-CM | POA: Diagnosis not present

## 2019-02-16 DIAGNOSIS — F325 Major depressive disorder, single episode, in full remission: Secondary | ICD-10-CM

## 2019-02-16 DIAGNOSIS — Z0000 Encounter for general adult medical examination without abnormal findings: Secondary | ICD-10-CM | POA: Diagnosis not present

## 2019-02-16 NOTE — Assessment & Plan Note (Signed)
Stable, controlled in remission on SSRI Continue current therapy

## 2019-02-16 NOTE — Assessment & Plan Note (Signed)
Well-controlled HTN - Home BP readings normal  History of MI / CAD. Peripheral edema   Plan:  1. Continue current BP regimen - Amlodipine 10mg  daily, Metoprolol XL 50mg  daily, Furosemide 40mg  daily 2. Encourage improved lifestyle - low sodium diet, regular exercise 3. Continue monitor BP outside office, bring readings to next visit, if persistently >140/90 or new symptoms notify office sooner

## 2019-02-16 NOTE — Assessment & Plan Note (Addendum)
Well controlled cholesterol on statin and lifestyle - now has some high TG still Last lipid panel 02/2019 Calculated ASCVD 10 yr risk score elevated  Plan: 1. Continue current meds - Rosuvastatin 10mg  daily 2. Continue ASA 81mg  for primary ASCVD risk reduction 3. Encourage improved lifestyle - low carb/cholesterol, reduce portion size, continue improving regular exercise

## 2019-02-16 NOTE — Progress Notes (Signed)
Subjective:    Patient ID: Kevin Perry, male    DOB: 08-Nov-1950, 69 y.o.   MRN: [id number]  Kevin Perry is a 69 y.o. male presenting on 02/16/2019 for Annual Exam  Virtual / Telehealth Encounter - Telephone  The purpose of this virtual visit is to provide medical care while limiting exposure to the novel coronavirus (COVID19) for both patient and office staff.  Consent was obtained for remote visit:  Yes.   Answered questions that patient had about telehealth interaction:  Yes.   I discussed the limitations, risks, security and privacy concerns of performing an evaluation and management service by video/telephone. I also discussed with the patient that there may be a patient responsible charge related to this service. The patient expressed understanding and agreed to proceed.  Patient Location: Home Provider Location: [facility] (Office)   HPI   CHRONIC DM, Type 2with Hyperglycemia: - Last visit with me 10/2018, see prior note, increased Metformin dose at that time - Setback with COVID19 12/2018, dehydration, dietary limitations and couldn't take medicine, thinks he had higher sugars as result - Recently over past few days to week started to increase Metformin IR 584m x 2 in AM / x 1 in PM - doing better ,tolerating wel - Today patient reports no other concerns, he feels better overall, he is worried about A1c 8.5 however, has had 8.1 to 8.4 in past -CBGs: Avg AM reading140 to 170 avg, low >>341Meds: Trulicity1.556mweekly, Glipizide 1091maily, Metformin see above Reports good compliance. Tolerating well w/o side-effects Currently on ACEi Lifestyle: - Diet (trying to improve diet reduce carb intake) - Exercise (walkregularly) - [facility] - he will try to request them to send us [place] of record. Denies hypoglycemia, polyuria, visual changes, numbness or tingling.  CHRONIC  HTN: Reports no concerns, outside BP readings are normal. Current Meds - Amlodipine 93m63mily, Furosemide 40mg38mliy, Lisinopril 20mg 47my, Metoprolol XL 50mg d58m   Reports good compliance, took meds today. Tolerating well, w/o complaints. Denies CP, dyspnea, HA, edema, dizziness / lightheadedness  HYPERLIPIDEMIA / Obesity BMI >31 - Reports no concerns. Last lipid panel 02/2019, controlled with mild low HDL, elevated TG - Currently taking Rosuvastatin 93mg, t16mating well without side effects or myalgias  Major Depression, in remission He denies any active depression symptoms at this time. Doing well on Escitalopram.   Health Maintenance: UTD Cologuard negative 11/27/18 UTD Vaccines TDap, PNA series, Flu Negative Hep C 2017  Depression screen PHQ 2/9 [facility]2/2021 10/28/2018 07/17/2018  Decreased Interest 0 0 0  Down, Depressed, Hopeless 0 0 0  PHQ - 2 Score 0 0 0  Altered sleeping 0 0 0  Tired, decreased energy 0 0 0  Change in appetite 0 0 0  Feeling bad or failure about yourself  0 0 0  Trouble concentrating 0 0 0  Moving slowly or fidgety/restless 0 0 0  Suicidal thoughts 0 0 0  PHQ-9 Score 0 0 0  Difficult doing work/chores Not difficult at all Not difficult at all Not difficult at all  Some recent data might be hidden    Past Medical History:  Diagnosis Date  . Anxiety   . Arthritis   . Myocardial infarction (HCC) [facility] Past Surgical History:  Procedure Laterality Date  . CHOLECYSTECTOMY    . CORONARY ANGIOPLASTY WITH STENT PLACEMENT N/A 2008  . FOOT SURGERY Left    DOS  8.1.[place], SECOND MET OSTEOTOMY W/SCREW, HAM TOE REPAIR 2,,4 , PARTIAL AMP 3RD DIGIT   . GALLBLADDER SURGERY    . KNEE SURERY Right   . TOTAL KNEE ARTHROPLASTY Right 08/30/2014   Procedure: TOTAL KNEE ARTHROPLASTY;  Surgeon: [name], MD;  Location: [facility] ORS;  Service: Orthopedics;  Laterality: Right;   Social History   Socioeconomic History  . Marital status: Married     Spouse name: Not on file  . Number of children: Not on file  . Years of education: Not on file  . Highest education level: Not on file  Occupational History  . Not on file  Tobacco Use  . Smoking status: Former Smoker    Quit date: 08/16/2004    Years since quitting: 14.5  . Smokeless tobacco: Former Systems developer  . Tobacco comment: quit 20 plus years  Substance and Sexual Activity  . Alcohol use: Yes    Comment: SOCIAL  . Drug use: No  . Sexual activity: Not on file  Other Topics Concern  . Not on file  Social History Narrative  . Not on file   Social Determinants of Health   Financial Resource Strain:   . Difficulty of Paying Living Expenses: Not on file  Food Insecurity:   . Worried About Charity fundraiser in the Last Year: Not on file  . Ran Out of Food in the Last Year: Not on file  Transportation Needs:   . Lack of Transportation (Medical): Not on file  . Lack of Transportation (Non-Medical): Not on file  Physical Activity:   . Days of Exercise per Week: Not on file  . Minutes of Exercise per Session: Not on file  Stress:   . Feeling of Stress : Not on file  Social Connections:   . Frequency of Communication with Friends and Family: Not on file  . Frequency of Social Gatherings with Friends and Family: Not on file  . Attends Religious Services: Not on file  . Active Member of Clubs or Organizations: Not on file  . Attends Archivist Meetings: Not on file  . Marital Status: Not on file  Intimate Partner Violence:   . Fear of Current or Ex-Partner: Not on file  . Emotionally Abused: Not on file  . Physically Abused: Not on file  . Sexually Abused: Not on file   No family history on file. Current Outpatient Medications on File Prior to Visit  Medication Sig  . albuterol (PROVENTIL HFA;VENTOLIN HFA) 108 (90 Base) MCG/ACT inhaler Inhale 2 puffs into the lungs every 4 (four) hours as needed for wheezing or shortness of breath (cough).  Marland Kitchen amLODipine (NORVASC)  10 MG tablet TAKE 1 TABLET BY MOUTH ONCE DAILY  . aspirin 81 MG tablet Take 81 mg by mouth daily.  . diclofenac sodium (VOLTAREN) 1 % GEL Apply 2 g topically 3 (three) times daily as needed. For hand arthritis  . DUREZOL 0.05 % EMUL   . escitalopram (LEXAPRO) 20 MG tablet TAKE 1 TABLET BY MOUTH AT BEDTIME.  . fluticasone (FLONASE) 50 MCG/ACT nasal spray Place 2 sprays into both nostrils daily. Use for 4-6 weeks then stop and use seasonally or as needed.  . folic acid (FOLVITE) 1 MG tablet   . furosemide (LASIX) 40 MG tablet TAKE 1 TABLET BY MOUTH ONCE DAILY AS NEEDED.  Marland Kitchen glipiZIDE (GLUCOTROL) 10 MG tablet TAKE 1 TABLET BY MOUTH ONCE DAILY WITH BREAKFAST  . Insulin Pen Needle 31G X 5 MM  MISC 1 each by Does not apply route daily.  Marland Kitchen lisinopril (ZESTRIL) 20 MG tablet TAKE 1 TABLET BY MOUTH ONCE EVERY MORNING  . loratadine (CLARITIN) 10 MG tablet Take 10 mg by mouth every morning.  . metFORMIN (GLUCOPHAGE) 500 MG tablet Take 1 tablet (569m) twice daily with food, can increase to 2 tabs (10038mdose) with evening meal. (Patient taking differently: Take 1 tablet (50022mtwice daily with food, can increase to 2 tabs (1000m18mse) with evening meal. Patient is taking 2 tablet in the morning and one at night.)  . methotrexate (RHEUMATREX) 2.5 MG tablet   . metoprolol succinate (TOPROL-XL) 50 MG 24 hr tablet TAKE 1 TABLET BY MOUTH ONCE DAILY.  . rosuvastatin (CRESTOR) 10 MG tablet TAKE 1 TABLET BY MOUTH ONCE EVERY MORNING.  . TRULICITY 1.5 MG/0KT/6.2BWN INJECT 1.5MG INTO SKIN ONCE WEEKLY   No current facility-administered medications on file prior to visit.    Review of Systems Per HPI unless specifically indicated above      Objective:    BP 125/78   Temp (!) 96.1 F (35.6 C) (Oral)   Ht '6\' 4"'  (1.93 m)   Wt 260 lb (117.9 kg)   BMI 31.65 kg/m   Wt Readings from Last 3 Encounters:  02/16/19 260 lb (117.9 kg)  01/01/19 280 lb (127 kg)  10/28/18 272 lb (123.4 kg)    Physical Exam    Virtual visit done by phone. No physical exam completed.  Results for orders placed or performed in visit on 02/11/19  PSA  Result Value Ref Range   PSA 0.5 < OR = 4.0 ng/mL  Lipid panel  Result Value Ref Range   Cholesterol 134 <200 mg/dL   HDL 36 (L) > OR = 40 mg/dL   Triglycerides 278 (H) <150 mg/dL   LDL Cholesterol (Calc) 65 mg/dL (calc)   Total CHOL/HDL Ratio 3.7 <5.0 (calc)   Non-HDL Cholesterol (Calc) 98 <130 mg/dL (calc)  COMPLETE METABOLIC PANEL WITH GFR  Result Value Ref Range   Glucose, Bld 233 (H) 65 - 99 mg/dL   BUN 10 7 - 25 mg/dL   Creat 0.82 0.70 - 1.25 mg/dL   GFR, Est Non African American 91 > OR = 60 mL/min/1.73m283mFR, Est African American 105 > OR = 60 mL/min/1.73m2 61mN/Creatinine Ratio NOT APPLICABLE 6 - 22 (calc)   Sodium 138 135 - 146 mmol/L   Potassium 3.5 3.5 - 5.3 mmol/L   Chloride 100 98 - 110 mmol/L   CO2 28 20 - 32 mmol/L   Calcium 9.3 8.6 - 10.3 mg/dL   Total Protein 6.5 6.1 - 8.1 g/dL   Albumin 4.3 3.6 - 5.1 g/dL   Globulin 2.2 1.9 - 3.7 g/dL (calc)   AG Ratio 2.0 1.0 - 2.5 (calc)   Total Bilirubin 0.9 0.2 - 1.2 mg/dL   Alkaline phosphatase (APISO) 45 35 - 144 U/L   AST 43 (H) 10 - 35 U/L   ALT 53 (H) 9 - 46 U/L  CBC with Differential/Platelet  Result Value Ref Range   WBC 6.2 3.8 - 10.8 Thousand/uL   RBC 4.73 4.20 - 5.80 Million/uL   Hemoglobin 15.2 13.2 - 17.1 g/dL   HCT 44.0 38.5 - 50.0 %   MCV 93.0 80.0 - 100.0 fL   MCH 32.1 27.0 - 33.0 pg   MCHC 34.5 32.0 - 36.0 g/dL   RDW 14.4 11.0 - 15.0 %   Platelets 195 140 - 400  Thousand/uL   MPV 10.3 7.5 - 12.5 fL   Neutro Abs 3,571 1,500 - 7,800 cells/uL   Lymphs Abs 1,810 850 - 3,900 cells/uL   Absolute Monocytes 508 200 - 950 cells/uL   Eosinophils Absolute 229 15 - 500 cells/uL   Basophils Absolute 81 0 - 200 cells/uL   Neutrophils Relative % 57.6 %   Total Lymphocyte 29.2 %   Monocytes Relative 8.2 %   Eosinophils Relative 3.7 %   Basophils Relative 1.3 %  Hemoglobin A1c   Result Value Ref Range   Hgb A1c MFr Bld 8.5 (H) <5.7 % of total Hgb   Mean Plasma Glucose 197 (calc)   eAG (mmol/L) 10.9 (calc)      Assessment & Plan:   Problem List Items Addressed This Visit    Type 2 diabetes mellitus with other specified complication (HCC)    Elevated A1c back up to 8.4 from 8.1, attributed to changed medicine unable to take with covid and recent illness, now improved again Complicated by Hyperglycemia, Hyperlipidemia, Mood, Cataract (s/p surgery) - increase risk of cardiovascular abnormality w/ known CAD - Prior Meds: Onglyza (ins/cost), Metformin XR 750 (GI intolerance)  Plan:  1. Continue increased Metformin IR 546m x 2 in AM / x 1 in PM - CONTINUE current meds - Trulicity 11.7GYweekly [place] inj, GLipizide 126mdaily - Counseling on risk hypoglycemia on sulfonylurea 2. Encourage improved lifestyle - low carb, low sugar diet, reduce portion size, goal for regular balanced 3 x daily meal instead of skipping lunch, continue improving regular exercise 3. Check CBG, bring log to next visit for review - check 3x weekly 4. Continue ASA, ACEi, Statin - [facility]  Consider increase Trulicity from 1.1.7[place] inj to 3.0 mg weekly inj in future, if still A1c >8 or definitely if >9 will offer this option, dose increase.      Obesity (BMI 30.0-34.9)    Diet lifestyle weight loss      Major depression in remission ([place]   Stable, controlled in remission on SSRI Continue current therapy      Hyperlipidemia associated with type 2 diabetes mellitus ([facility]   Well controlled cholesterol on statin and lifestyle - now has some high TG still Last lipid panel 02/2019 Calculated ASCVD 10 yr risk score elevated  Plan: 1. Continue current meds - Rosuvastatin 1080maily 2. Continue ASA 32m10mr primary ASCVD risk reduction 3. Encourage improved lifestyle - low carb/cholesterol, reduce portion size, continue improving regular exercise       Essential hypertension    Well-controlled HTN - Home BP readings normal  History of MI / CAD. Peripheral edema   Plan:  1. Continue current BP regimen - Amlodipine 10mg70mly, Metoprolol XL 50mg 50my, Furosemide 40mg d39m 2. Encourage improved lifestyle - low sodium diet, regular exercise 3. Continue monitor BP outside office, bring readings to next visit, if persistently >140/90 or new symptoms notify office sooner        Other Visit Diagnoses    Annual physical exam    -  Primary      Updated Health Maintenance information Reviewed recent lab results with patient Encouraged improvement to lifestyle with diet and exercise Goal of weight loss   No orders of the defined types were placed in this encounter.   Follow up plan: Return in about 6 months (around 08/16/2019) for 6 month DM A1c med adjust.    Patient verbalizes understanding with  the above medical recommendations including the limitation of remote medical advice.  Specific follow-up and call-back criteria were given for patient to follow-up or seek medical care more urgently if needed.  Total duration of direct patient care provided via telephone: 15 minutes   [name], [facility] 02/16/2019, 3:14 PM

## 2019-02-16 NOTE — Patient Instructions (Addendum)
Keep working on improving sugar.  Recent Labs    07/14/18 1004 10/28/18 1441 02/11/19 1124  HGBA1C 8.4* 8.1* 8.5*   Goal A1c < 8  If still not improved by next visit can increase Trulicity to 3.0 in the future.   Please schedule a Follow-up Appointment to: Return in about 6 months (around 08/16/2019) for 6 month DM A1c med adjust.  If you have any other questions or concerns, please feel free to call the office or send a message through [place]. You may also schedule an earlier appointment if necessary.  Additionally, you may be receiving a survey about your experience at our office within a few days to 1 week by e-mail or mail. We value your feedback.  [name], DO [place]

## 2019-02-16 NOTE — Assessment & Plan Note (Signed)
Elevated A1c back up to 8.4 from 8.1, attributed to changed medicine unable to take with covid and recent illness, now improved again Complicated by Hyperglycemia, Hyperlipidemia, Mood, Cataract (s/p surgery) - increase risk of cardiovascular abnormality w/ known CAD - Prior Meds: Onglyza (ins/cost), Metformin XR 750 (GI intolerance)  Plan:  1. Continue increased Metformin IR 500mg  x 2 in AM / x 1 in PM - CONTINUE current meds - Trulicity 1.5mg  weekly [place] inj, GLipizide 10mg  daily - Counseling on risk hypoglycemia on sulfonylurea 2. Encourage improved lifestyle - low carb, low sugar diet, reduce portion size, goal for regular balanced 3 x daily meal instead of skipping lunch, continue improving regular exercise 3. Check CBG, bring log to next visit for review - check 3x weekly 4. Continue ASA, ACEi, Statin - Request last DM Eye record from [facility] 12/2018  Consider increase Trulicity from 1.5mg  weekly inj to 3.0 mg weekly inj in future, if still A1c >8 or definitely if >9 will offer this option, dose increase.

## 2019-02-16 NOTE — Assessment & Plan Note (Signed)
Diet lifestyle weight loss

## 2019-02-22 ENCOUNTER — Other Ambulatory Visit: Payer: Self-pay | Admitting: Family Medicine

## 2019-02-22 DIAGNOSIS — E785 Hyperlipidemia, unspecified: Secondary | ICD-10-CM

## 2019-02-22 DIAGNOSIS — E1169 Type 2 diabetes mellitus with other specified complication: Secondary | ICD-10-CM

## 2019-02-22 DIAGNOSIS — I872 Venous insufficiency (chronic) (peripheral): Secondary | ICD-10-CM

## 2019-03-03 DIAGNOSIS — L570 Actinic keratosis: Secondary | ICD-10-CM | POA: Diagnosis not present

## 2019-03-03 DIAGNOSIS — R21 Rash and other nonspecific skin eruption: Secondary | ICD-10-CM | POA: Diagnosis not present

## 2019-03-15 ENCOUNTER — Other Ambulatory Visit: Payer: Self-pay | Admitting: Family Medicine

## 2019-03-15 DIAGNOSIS — I2510 Atherosclerotic heart disease of native coronary artery without angina pectoris: Secondary | ICD-10-CM

## 2019-03-15 DIAGNOSIS — I10 Essential (primary) hypertension: Secondary | ICD-10-CM

## 2019-03-23 ENCOUNTER — Other Ambulatory Visit: Payer: Self-pay | Admitting: Family Medicine

## 2019-03-23 DIAGNOSIS — F329 Major depressive disorder, single episode, unspecified: Secondary | ICD-10-CM

## 2019-03-23 DIAGNOSIS — F32A Depression, unspecified: Secondary | ICD-10-CM

## 2019-06-15 ENCOUNTER — Other Ambulatory Visit: Payer: Self-pay | Admitting: Family Medicine

## 2019-07-14 DIAGNOSIS — H26493 Other secondary cataract, bilateral: Secondary | ICD-10-CM | POA: Diagnosis not present

## 2019-07-14 DIAGNOSIS — E113293 Type 2 diabetes mellitus with mild nonproliferative diabetic retinopathy without macular edema, bilateral: Secondary | ICD-10-CM | POA: Diagnosis not present

## 2019-08-10 ENCOUNTER — Encounter: Payer: Self-pay | Admitting: Family Medicine

## 2019-08-10 ENCOUNTER — Other Ambulatory Visit: Payer: Self-pay | Admitting: Family Medicine

## 2019-08-10 ENCOUNTER — Ambulatory Visit (INDEPENDENT_AMBULATORY_CARE_PROVIDER_SITE_OTHER): Payer: PPO | Admitting: Family Medicine

## 2019-08-10 ENCOUNTER — Other Ambulatory Visit: Payer: Self-pay

## 2019-08-10 VITALS — BP 144/74 | HR 58 | Temp 97.5°F | Resp 16 | Ht 76.0 in | Wt 270.6 lb

## 2019-08-10 DIAGNOSIS — Z0000 Encounter for general adult medical examination without abnormal findings: Secondary | ICD-10-CM

## 2019-08-10 DIAGNOSIS — E669 Obesity, unspecified: Secondary | ICD-10-CM | POA: Diagnosis not present

## 2019-08-10 DIAGNOSIS — I10 Essential (primary) hypertension: Secondary | ICD-10-CM

## 2019-08-10 DIAGNOSIS — F325 Major depressive disorder, single episode, in full remission: Secondary | ICD-10-CM | POA: Diagnosis not present

## 2019-08-10 DIAGNOSIS — R351 Nocturia: Secondary | ICD-10-CM

## 2019-08-10 DIAGNOSIS — I2510 Atherosclerotic heart disease of native coronary artery without angina pectoris: Secondary | ICD-10-CM

## 2019-08-10 DIAGNOSIS — E785 Hyperlipidemia, unspecified: Secondary | ICD-10-CM

## 2019-08-10 DIAGNOSIS — E1169 Type 2 diabetes mellitus with other specified complication: Secondary | ICD-10-CM

## 2019-08-10 DIAGNOSIS — I252 Old myocardial infarction: Secondary | ICD-10-CM | POA: Insufficient documentation

## 2019-08-10 DIAGNOSIS — E66811 Obesity, class 1: Secondary | ICD-10-CM

## 2019-08-10 LAB — POCT GLYCOSYLATED HEMOGLOBIN (HGB A1C): Hemoglobin A1C: 7.2 % — AB (ref 4.0–5.6)

## 2019-08-10 NOTE — Assessment & Plan Note (Addendum)
Dramatic improved A1c from 8.5 down to 7.2 on lifestyle and med regimen Complicated by Hyperglycemia, Hyperlipidemia, Mood, Cataract (s/p surgery) - increase risk of cardiovascular abnormality w/ known CAD - Prior Meds: Onglyza (ins/cost), Metformin XR 750 (GI intolerance)  Plan:  1. Continue increased Metformin IR 500mg  x 2 in AM / x 1 in PM - CONTINUE current meds - Trulicity 1.5mg  weekly [place] inj, GLipizide 10mg  daily - Counseling on risk hypoglycemia on sulfonylurea - We discussed option in future of increase Trulicity to 3.0mg  weekly and reduce or Stop Glipizide - may do this in future if indicated. His Trulicity is from PAP from manufacturer would need to submit change of dose form.  2. Encourage improved lifestyle - low carb, low sugar diet, reduce portion size, goal for regular balanced 3 x daily meal instead of skipping lunch, continue improving regular exercise 3. Check CBG, bring log to next visit for review - check 3x weekly 4. Continue ASA, ACEi, Statin - Request last DM Eye record from [facility] 12/2018 - DM Foot exam

## 2019-08-10 NOTE — Assessment & Plan Note (Signed)
Stable, controlled in remission on SSRI Continue current therapy

## 2019-08-10 NOTE — Assessment & Plan Note (Signed)
Elevated BP today, repeat improved still not at goal - Home BP readings normal previously History of MI / CAD. Peripheral edema   Plan:  1. Continue current BP regimen - Amlodipine 10mg  daily, Metoprolol XL 50mg  daily, Furosemide 40mg  daily 2. Encourage improved lifestyle - low sodium diet, regular exercise 3. Continue monitor BP outside office, bring readings to next visit, if persistently >140/90 or new symptoms notify office sooner

## 2019-08-10 NOTE — Assessment & Plan Note (Signed)
Weight overall improved since 12/2018 Encourage lifestyle diet exercise

## 2019-08-10 NOTE — Patient Instructions (Addendum)
Thank you for coming to the office today.  Recent Labs    10/28/18 1441 02/11/19 1124 08/10/19 0932  HGBA1C 8.1* 8.5* 7.2*   Keep up the great work.  Caution if lower sugar results if you have any concerns with Glipizide.  We can INCREASE Trulicity from 1.5 up to 3mg  in future, with new order.  Keep track of BP regularly   Please schedule a Follow-up Appointment to: Return in about 6 months (around 02/10/2020) for Annual Physical.  If you have any other questions or concerns, please feel free to call the office or send a message through [place]. You may also schedule an earlier appointment if necessary.  Additionally, you may be receiving a survey about your experience at our office within a few days to 1 week by e-mail or mail. We value your feedback.  [name], DO [place]

## 2019-08-10 NOTE — Progress Notes (Signed)
Subjective:    Patient ID: Kevin Perry, male    DOB: [birth year], 69 y.o.   MRN: [id number]  Kevin Perry is a 69 y.o. male presenting on 08/10/2019 for Diabetes   HPI   CHRONIC DM, Type 2with Hyperglycemia: Previously elevated a1c, last dose increase 9./2020 Metformin dose at that time - Setback with COVID19 12/2018, dehydration, dietary limitations and couldn't take medicine, thinks he had higher sugars as result Today now he reports doing better on med regimen. Tolerating trulicity and on higher dose metformin -CBGs: Avg AM reading140 to 170 avg, low >401 Meds: Trulicity1.5mg  weekly, Glipizide 10mg  daily, Metformin IR 500mg  x 2 in AM / x 1 in PM Reports good compliance. Tolerating well w/o side-effects Currently on ACEi Lifestyle: - Diet (trying to improve diet reduce carb intake) - Exercise (walkregularly) - [facility] - he will try to request them to send [place] copy of record. Denies hypoglycemia, polyuria, visual changes, numbness or tingling.  CHRONIC HTN: Reportsno concerns, outside BP readings are normal. Current Meds -Amlodipine 10mg  daily, Furosemide 40mg  dailiy, Lisinopril 20mg  daily, Metoprolol XL 50mg  daily Reports good compliance, took meds today. Tolerating well, w/o complaints. Denies CP, dyspnea, HA, edema, dizziness / lightheadedness  HYPERLIPIDEMIA/ Obesity BMI >31 - Reports no concerns. Last lipid panel1/2021, controlledwith mild low HDL, elevated TG - Currently takingRosuvastatin 10mg , tolerating well without side effects or myalgias  Major Depression, in remission He denies any active depression symptoms at this time. Doing well on Escitalopram.   Health Maintenance: UTD Cologuard negative 11/27/18 UTD Vaccines TDap, PNA series, Flu Negative Hep C 2017   Depression screen [facility] 2/9 08/10/2019 02/16/2019 10/28/2018  Decreased Interest 0 0 0  Down, Depressed,  Hopeless 0 0 0  PHQ - 2 Score 0 0 0  Altered sleeping 0 0 0  Tired, decreased energy 0 0 0  Change in appetite 0 0 0  Feeling bad or failure about yourself  0 0 0  Trouble concentrating 0 0 0  Moving slowly or fidgety/restless 0 0 0  Suicidal thoughts 0 0 0  PHQ-9 Score 0 0 0  Difficult doing work/chores Not difficult at all Not difficult at all Not difficult at all  Some recent data might be hidden    Social History   Tobacco Use  . Smoking status: Former Smoker    Quit date: 08/16/2004    Years since quitting: 14.9  . Smokeless tobacco: Former Systems developer  . Tobacco comment: quit 20 plus years  Vaping Use  . Vaping Use: Never used  Substance Use Topics  . Alcohol use: Yes    Comment: SOCIAL  . Drug use: No    Review of Systems Per HPI unless specifically indicated above     Objective:    BP (!) 144/74 (BP Location: Left Arm, Cuff Size: Normal)   Pulse (!) 58   Temp (!) 97.5 F (36.4 C) (Temporal)   Resp 16   Ht 6\' 4"  (1.93 m)   Wt 270 lb 9.6 oz (122.7 kg)   SpO2 96%   BMI 32.94 kg/m   Wt Readings from Last 3 Encounters:  08/10/19 270 lb 9.6 oz (122.7 kg)  02/16/19 260 lb (117.9 kg)  01/01/19 280 lb (127 kg)    Physical Exam Vitals and nursing note reviewed.  Constitutional:      General: He is not in acute distress.    Appearance: He is well-developed. He is not  diaphoretic.     Comments: Well-appearing, comfortable, cooperative  HENT:     Head: Normocephalic and atraumatic.  Eyes:     General:        Right eye: No discharge.        Left eye: No discharge.     Conjunctiva/sclera: Conjunctivae normal.  Neck:     Thyroid: No thyromegaly.  Cardiovascular:     Rate and Rhythm: Normal rate and regular rhythm.     Heart sounds: Normal heart sounds. No murmur heard.   Pulmonary:     Effort: Pulmonary effort is normal. No respiratory distress.     Breath sounds: Normal breath sounds. No wheezing or rales.  Musculoskeletal:        General: Normal range of  motion.     Cervical back: Normal range of motion and neck supple.  Lymphadenopathy:     Cervical: No cervical adenopathy.  Skin:    General: Skin is warm and dry.     Findings: No erythema or rash.  Neurological:     Mental Status: He is alert and oriented to person, place, and time.  Psychiatric:        Behavior: Behavior normal.     Comments: Well groomed, good eye contact, normal speech and thoughts      Recent Labs    10/28/18 1441 02/11/19 1124 08/10/19 0932  HGBA1C 8.1* 8.5* 7.2*   Diabetic Foot Exam - Simple   Simple Foot Form Diabetic Foot exam was performed with the following findings: Yes 08/10/2019  9:38 AM  Visual Inspection See comments: Yes Sensation Testing Intact to touch and monofilament testing bilaterally: Yes Pulse Check Posterior Tibialis and Dorsalis pulse intact bilaterally: Yes Comments R foot s/p 2nd toe amputation long healed. Left food s/p distal ray amputation middle toe. Callus formation multiple areas, some thicker toenails. Intact monofilament sensation.      Results for orders placed or performed in visit on 08/10/19  POCT HgB A1C  Result Value Ref Range   Hemoglobin A1C 7.2 (A) 4.0 - 5.6 %      Assessment & Plan:   Problem List Items Addressed This Visit    Type 2 diabetes mellitus with other specified complication ([place]) - Primary    Dramatic improved A1c from 8.5 down to 7.2 on lifestyle and med regimen Complicated by Hyperglycemia, Hyperlipidemia, Mood, Cataract (s/p surgery) - increase risk of cardiovascular abnormality w/ known CAD - Prior Meds: Onglyza (ins/cost), Metformin XR 750 (GI intolerance)  Plan:  1. Continue increased Metformin IR 500mg  x 2 in AM / x 1 in PM - CONTINUE current meds - Trulicity 1.5mg  weekly [place] inj, GLipizide 10mg  daily - Counseling on risk hypoglycemia on sulfonylurea - We discussed option in future of increase Trulicity to 3.0mg  weekly and reduce or Stop Glipizide - may do this in future if  indicated. His Trulicity is from PAP from manufacturer would need to submit change of dose form.  2. Encourage improved lifestyle - low carb, low sugar diet, reduce portion size, goal for regular balanced 3 x daily meal instead of skipping lunch, continue improving regular exercise 3. Check CBG, bring log to next visit for review - check 3x weekly 4. Continue ASA, ACEi, Statin - Request [facility] DM [facility] 12/2018 - DM Foot exam      Relevant Orders   POCT HgB A1C (Completed)   Obesity (BMI 30.0-34.9)    Weight overall improved since 12/2018 Encourage lifestyle diet exercise  Major depression in remission (HCC)    Stable, controlled in remission on SSRI Continue current therapy      History of MI (myocardial infarction)   Essential hypertension    Elevated BP today, repeat improved still not at goal - Home BP readings normal previously History of MI / CAD. Peripheral edema   Plan:  1. Continue current BP regimen - Amlodipine 10mg  daily, Metoprolol XL 50mg  daily, Furosemide 40mg  daily 2. Encourage improved lifestyle - low sodium diet, regular exercise 3. Continue monitor BP outside office, bring readings to next visit, if persistently >140/90 or new symptoms notify office sooner          No orders of the defined types were placed in this encounter.     Follow up plan: Return in about 6 months (around 02/10/2020) for Annual Physical.  Future labs ordered for 02/03/20  [name], [facility] 08/10/2019, 9:30 AM

## 2019-09-06 ENCOUNTER — Other Ambulatory Visit: Payer: Self-pay | Admitting: Family Medicine

## 2019-09-06 DIAGNOSIS — E1169 Type 2 diabetes mellitus with other specified complication: Secondary | ICD-10-CM

## 2019-09-06 NOTE — Telephone Encounter (Signed)
Requested Prescriptions  Pending Prescriptions Disp Refills  . metFORMIN (GLUCOPHAGE) 500 MG tablet [Pharmacy Med Name: METFORMIN HCL 500 MG TAB] 270 tablet 1    Sig: TAKE 2 TABLETS BY MOUTH ONCE EVERY MORNING WITH BREAKFAST AND 1 TABLET [place]     Endocrinology:  Diabetes - Biguanides Passed - 09/06/2019 12:42 PM      Passed - Cr in normal range and within 360 days    Creat  Date Value Ref Range Status  02/11/2019 0.82 0.70 - 1.25 mg/dL Final    Comment:    For patients >70 years of age, the reference limit for Creatinine is approximately 13% higher for people identified as African-American. .    Creatinine, POC  Date Value Ref Range Status  03/09/2015 0 mg/dL Final         Passed - HBA1C is between 0 and 7.9 and within 180 days    Hemoglobin A1C  Date Value Ref Range Status  08/10/2019 7.2 (A) 4.0 - 5.6 % Final  07/27/2012 7.6 (H) 4.2 - 6.3 % Final    Comment:    The American Diabetes Association recommends that a primary goal of therapy should be <7% and that physicians should reevaluate the treatment regimen in patients with HbA1c values consistently >8%.    Hgb A1c MFr Bld  Date Value Ref Range Status  02/11/2019 8.5 (H) <5.7 % of total Hgb Final    Comment:    For someone without known diabetes, a hemoglobin A1c value of 6.5% or greater indicates that they may have  diabetes and this should be confirmed with a follow-up  test. . For someone with known diabetes, a value <7% indicates  that their diabetes is well controlled and a value  greater than or equal to 7% indicates suboptimal  control. A1c targets should be individualized based on  duration of diabetes, age, comorbid conditions, and  other considerations. . Currently, no consensus exists regarding use of hemoglobin A1c for diagnosis of diabetes for children. .          Passed - eGFR in normal range and within 360 days    GFR, Est African American  Date Value Ref Range Status  02/11/2019 105 >  OR = 60 mL/min/1.25m Final   GFR, Est Non African American  Date Value Ref Range Status  02/11/2019 91 > OR = 60 mL/min/1.76mFinal         Passed - Valid encounter within last 6 months    Recent Outpatient Visits          3 weeks ago Type 2 diabetes mellitus with other specified complication, without long-term current use of insulin ([facility]  [facility]   6 months ago [facility] physical [facility]   [facility]   10 months ago Type 2 diabetes mellitus with other specified complication, without long-term current use of [facility]   1 year ago Type 2 diabetes mellitus with other specified complication, without long-term current use of insulin ([facility]  [facility]   1 year ago Acute rhinosinusitis   [facility]      Future Appointments            In 5 months [facility]ought[facility]

## 2019-10-07 ENCOUNTER — Encounter: Payer: Self-pay | Admitting: Family Medicine

## 2019-10-08 ENCOUNTER — Encounter: Payer: Self-pay | Admitting: Family Medicine

## 2019-10-08 DIAGNOSIS — E113399 Type 2 diabetes mellitus with moderate nonproliferative diabetic retinopathy without macular edema, unspecified eye: Secondary | ICD-10-CM | POA: Insufficient documentation

## 2019-10-26 ENCOUNTER — Other Ambulatory Visit: Payer: Self-pay

## 2019-10-26 DIAGNOSIS — Z20822 Contact with and (suspected) exposure to covid-19: Secondary | ICD-10-CM

## 2019-10-27 LAB — SARS-COV-2, NAA 2 DAY TAT

## 2019-10-27 LAB — NOVEL CORONAVIRUS, NAA: SARS-CoV-2, NAA: NOT DETECTED

## 2019-11-08 DIAGNOSIS — E1169 Type 2 diabetes mellitus with other specified complication: Secondary | ICD-10-CM | POA: Diagnosis not present

## 2019-11-08 DIAGNOSIS — I10 Essential (primary) hypertension: Secondary | ICD-10-CM | POA: Diagnosis not present

## 2019-11-08 DIAGNOSIS — E118 Type 2 diabetes mellitus with unspecified complications: Secondary | ICD-10-CM | POA: Diagnosis not present

## 2019-11-08 DIAGNOSIS — I2510 Atherosclerotic heart disease of native coronary artery without angina pectoris: Secondary | ICD-10-CM | POA: Diagnosis not present

## 2019-11-08 DIAGNOSIS — E785 Hyperlipidemia, unspecified: Secondary | ICD-10-CM | POA: Diagnosis not present

## 2019-11-15 ENCOUNTER — Other Ambulatory Visit: Payer: Self-pay

## 2019-11-15 ENCOUNTER — Other Ambulatory Visit
Admission: RE | Admit: 2019-11-15 | Discharge: 2019-11-15 | Disposition: A | Discharge status: Home / Self Care | Payer: PPO | Source: Ambulatory Visit | Attending: Cardiology | Admitting: Cardiology

## 2019-11-15 DIAGNOSIS — Z20822 Contact with and (suspected) exposure to covid-19: Secondary | ICD-10-CM | POA: Insufficient documentation

## 2019-11-15 DIAGNOSIS — Z01812 Encounter for preprocedural laboratory examination: Secondary | ICD-10-CM | POA: Diagnosis not present

## 2019-11-15 LAB — SARS CORONAVIRUS 2 (TAT 6-24 HRS): SARS Coronavirus 2: NEGATIVE

## 2019-11-16 ENCOUNTER — Other Ambulatory Visit: Payer: Self-pay | Admitting: Cardiology

## 2019-11-16 MED ORDER — SODIUM CHLORIDE 0.9% FLUSH: 3.0000 mL | Freq: Two times a day (BID) | INTRAVENOUS | Status: DC

## 2019-11-17 ENCOUNTER — Encounter: Payer: Self-pay | Admitting: Anesthesiology

## 2019-11-17 ENCOUNTER — Encounter: Payer: Self-pay | Admitting: Cardiology

## 2019-11-17 ENCOUNTER — Ambulatory Visit
Admission: RE | Admit: 2019-11-17 | Discharge: 2019-11-17 | Disposition: A | Discharge status: Home / Self Care | Payer: PPO | Attending: Cardiology | Admitting: Cardiology

## 2019-11-17 ENCOUNTER — Encounter
Admission: RE | Disposition: A | Discharge status: Home / Self Care | Payer: Self-pay | Source: Home / Self Care | Attending: Cardiology

## 2019-11-17 ENCOUNTER — Other Ambulatory Visit: Payer: Self-pay

## 2019-11-17 DIAGNOSIS — R0602 Shortness of breath: Secondary | ICD-10-CM | POA: Insufficient documentation

## 2019-11-17 DIAGNOSIS — I2582 Chronic total occlusion of coronary artery: Secondary | ICD-10-CM | POA: Insufficient documentation

## 2019-11-17 DIAGNOSIS — Z886 Allergy status to analgesic agent status: Secondary | ICD-10-CM | POA: Diagnosis not present

## 2019-11-17 DIAGNOSIS — Z79899 Other long term (current) drug therapy: Secondary | ICD-10-CM | POA: Insufficient documentation

## 2019-11-17 DIAGNOSIS — E7849 Other hyperlipidemia: Secondary | ICD-10-CM | POA: Diagnosis not present

## 2019-11-17 DIAGNOSIS — R0789 Other chest pain: Secondary | ICD-10-CM | POA: Diagnosis not present

## 2019-11-17 DIAGNOSIS — R079 Chest pain, unspecified: Secondary | ICD-10-CM

## 2019-11-17 DIAGNOSIS — Z7984 Long term (current) use of oral hypoglycemic drugs: Secondary | ICD-10-CM | POA: Diagnosis not present

## 2019-11-17 DIAGNOSIS — I252 Old myocardial infarction: Secondary | ICD-10-CM | POA: Insufficient documentation

## 2019-11-17 DIAGNOSIS — I2510 Atherosclerotic heart disease of native coronary artery without angina pectoris: Secondary | ICD-10-CM | POA: Insufficient documentation

## 2019-11-17 DIAGNOSIS — E118 Type 2 diabetes mellitus with unspecified complications: Secondary | ICD-10-CM | POA: Insufficient documentation

## 2019-11-17 DIAGNOSIS — F329 Major depressive disorder, single episode, unspecified: Secondary | ICD-10-CM | POA: Insufficient documentation

## 2019-11-17 DIAGNOSIS — I10 Essential (primary) hypertension: Secondary | ICD-10-CM | POA: Insufficient documentation

## 2019-11-17 HISTORY — DX: Atherosclerotic heart disease of native coronary artery without angina pectoris: I25.10

## 2019-11-17 HISTORY — PX: LEFT HEART CATH AND CORONARY ANGIOGRAPHY: CATH118249

## 2019-11-17 LAB — GLUCOSE, CAPILLARY: Glucose-Capillary: 167 mg/dL — ABNORMAL HIGH (ref 70–99)

## 2019-11-17 SURGERY — LEFT HEART CATH AND CORONARY ANGIOGRAPHY
Anesthesia: Moderate Sedation | Laterality: Left

## 2019-11-17 MED ORDER — MIDAZOLAM HCL 2 MG/2ML IJ SOLN
INTRAMUSCULAR | Status: DC | PRN
  Administered 2019-11-17: 1 mg via INTRAVENOUS

## 2019-11-17 MED ORDER — IOHEXOL 300 MG/ML  SOLN
INTRAMUSCULAR | Status: DC | PRN
  Administered 2019-11-17: 170 mL

## 2019-11-17 MED ORDER — LABETALOL HCL 5 MG/ML IV SOLN
INTRAVENOUS | Status: AC
  Administered 2019-11-17: 10 mg via INTRAVENOUS
  Filled 2019-11-17: qty 4

## 2019-11-17 MED ORDER — SODIUM CHLORIDE 0.9% FLUSH: 3.0000 mL | INTRAVENOUS | Status: DC | PRN

## 2019-11-17 MED ORDER — SODIUM CHLORIDE 0.9 % WEIGHT BASED INFUSION: 1.0000 mL/kg/h | INTRAVENOUS | Status: DC

## 2019-11-17 MED ORDER — ASPIRIN 81 MG PO CHEW
81.0000 mg | CHEWABLE_TABLET | ORAL | Status: AC
  Administered 2019-11-17: 81 mg via ORAL

## 2019-11-17 MED ORDER — SODIUM CHLORIDE 0.9% FLUSH: 3.0000 mL | Freq: Two times a day (BID) | INTRAVENOUS | Status: DC

## 2019-11-17 MED ORDER — HEPARIN (PORCINE) IN NACL 1000-0.9 UT/500ML-% IV SOLN
INTRAVENOUS | Status: DC | PRN
  Administered 2019-11-17 (×2): 500 mL

## 2019-11-17 MED ORDER — HEPARIN (PORCINE) IN NACL 1000-0.9 UT/500ML-% IV SOLN
INTRAVENOUS | Status: AC
  Filled 2019-11-17: qty 1000

## 2019-11-17 MED ORDER — SODIUM CHLORIDE 0.9 % WEIGHT BASED INFUSION
3.0000 mL/kg/h | INTRAVENOUS | Status: AC
  Administered 2019-11-17: 3 mL/kg/h via INTRAVENOUS

## 2019-11-17 MED ORDER — FENTANYL CITRATE (PF) 100 MCG/2ML IJ SOLN
INTRAMUSCULAR | Status: DC | PRN
Start: 2019-11-17 — End: 2019-11-17
  Administered 2019-11-17: 25 ug via INTRAVENOUS

## 2019-11-17 MED ORDER — LABETALOL HCL 5 MG/ML IV SOLN
10.0000 mg | INTRAVENOUS | Status: DC | PRN
  Administered 2019-11-17: 10 mg via INTRAVENOUS

## 2019-11-17 MED ORDER — HYDRALAZINE HCL 20 MG/ML IJ SOLN: 10.0000 mg | INTRAMUSCULAR | Status: DC | PRN

## 2019-11-17 MED ORDER — FENTANYL CITRATE (PF) 100 MCG/2ML IJ SOLN
INTRAMUSCULAR | Status: AC
  Filled 2019-11-17: qty 2

## 2019-11-17 MED ORDER — ASPIRIN 81 MG PO CHEW
CHEWABLE_TABLET | ORAL | Status: AC
  Filled 2019-11-17: qty 1

## 2019-11-17 MED ORDER — SODIUM CHLORIDE 0.9 % IV SOLN: 250.0000 mL | INTRAVENOUS | Status: DC | PRN

## 2019-11-17 MED ORDER — ACETAMINOPHEN 325 MG PO TABS: 650.0000 mg | ORAL_TABLET | ORAL | Status: DC | PRN

## 2019-11-17 MED ORDER — ONDANSETRON HCL 4 MG/2ML IJ SOLN: 4.0000 mg | Freq: Four times a day (QID) | INTRAMUSCULAR | Status: DC | PRN

## 2019-11-17 MED ORDER — MIDAZOLAM HCL 2 MG/2ML IJ SOLN
INTRAMUSCULAR | Status: AC
  Filled 2019-11-17: qty 2

## 2019-11-17 SURGICAL SUPPLY — 13 items
CATH INFINITI 5FR ANG PIGTAIL (CATHETERS) ×2 IMPLANT
CATH INFINITI 5FR JL4 (CATHETERS) ×2 IMPLANT
CATH INFINITI JR4 5F (CATHETERS) ×2 IMPLANT
CATH VISTA GUIDE 6FR XB3.5 (CATHETERS) ×2 IMPLANT
CATH VISTA GUIDE 6FR XB3.5 SH (CATHETERS) IMPLANT
DEVICE CLOSURE MYNXGRIP 6/7F (Vascular Products) ×2 IMPLANT
KIT HEART LEFT (KITS) ×2 IMPLANT
KIT RIGHT HEART (MISCELLANEOUS) ×2 IMPLANT
NEEDLE PERC 18GX7CM (NEEDLE) ×2 IMPLANT
PACK CARDIAC CATH (CUSTOM PROCEDURE TRAY) ×2 IMPLANT
SHEATH AVANTI 5FR X 11CM (SHEATH) ×2 IMPLANT
SHEATH AVANTI 6FR X 11CM (SHEATH) ×2 IMPLANT
WIRE GUIDERIGHT .035X150 (WIRE) ×2 IMPLANT

## 2019-11-17 NOTE — H&P (Signed)
Chief Complaint: Chief Complaint  Patient presents with  . Establish Care  . Shortness of Breath  . Fatigue  . Shoulder Pain  Date of Service: 11/08/2019 Date of Birth: 1950-07-02 PCP: Rochel Brome, MD  History of Present Illness: [name] is a 69 y.o.male patient who presents to reestablish care. He has not been seen since 2018. He has a history of coronary artery disease. He is status post non Q wave myocardial infarction on 10/24/06 with left cardiac catheterization revealing 10% proximal circ, 10% mid circ, 25% mid LAD, 95% PDA. He is status post placement of a XIENCE DES 3.0 x 15 mm stent.since that time he has done fairly well. He now presents with shortness of breath fatigue similar to what he had prior to his PCI. He has a history of diabetes, hypertension, hyperlipidemia. EKG today reveals sinus bradycardia at a rate of 55 with a PR interval of 176 ms, QRS duration of 86 ms with a QTC of 409 ms and a QRS axis of -23 degrees. Patient has some exertional shortness of breath and chest tightness on occasion. Patient states that the chest pain and exertional shortness of breath is similar to what he had prior to his PCI.  Past Medical and Surgical History  Past Medical History Past Medical History:  Diagnosis Date  . Depression  . Diabetes (CMS-HCC)  . Heart attack (CMS-HCC)  . History of total knee arthroplasty, right 7.26.16  Dr.Menz  . Hypertension  . Osteoarthritis  . Seasonal allergies   Past Surgical History He has a past surgical history that includes Cholecystectomy; Cardiac Stent; Right Knee Arthroscopy (06/27/11); and Joint replacement (Right, 08/30/14).   Medications and Allergies  Current Medications  Current Outpatient Medications  Medication Sig Dispense Refill  . amLODIPine (NORVASC) 10 MG tablet Take 1 tablet by mouth once daily.  . citalopram (CELEXA) 20 MG tablet Take 1 tablet by mouth once daily.  . CRESTOR 10 mg tablet Take 1 tablet by mouth once daily.  .  dulaglutide (TRULICITY) 1.5 NO/6.7 mL subcutaneous injection INJECT 1.5MG  INTO SKIN ONCE WEEKLY  . folic acid (FOLVITE) 1 MG tablet Take 1 mg by mouth once daily  . FUROsemide (LASIX) 40 MG tablet Take 1 tablet by mouth once daily.  Marland Kitchen glipiZIDE (GLUCOTROL) 10 MG tablet Take 1 tablet by mouth 2 (two) times daily.  Marland Kitchen lisinopril (PRINIVIL,ZESTRIL) 20 MG tablet Take 1 tablet by mouth once daily.  Marland Kitchen loratadine (CLARITIN) 10 mg tablet Take 10 mg by mouth once daily.  . metFORMIN (GLUCOPHAGE-XR) 500 MG XR tablet Take by mouth.  . methotrexate (RHEUMATREX) 2.5 MG tablet  . metoprolol succinate (TOPROL-XL) 25 MG XL tablet Take 1 tablet (25 mg total) by mouth once daily 90 tablet 3  . saxagliptin (ONGLYZA) 5 mg tablet Take 1 tablet by mouth once daily.   No current facility-administered medications for this visit.   Allergies: Naproxen  Social and Family History  Social History reports that he has never smoked. His smokeless tobacco use includes chew. He reports that he does not drink alcohol and does not use drugs.  Family History Family History  Problem Relation Age of Onset  . Myocardial Infarction (Heart attack) Sister  . High blood pressure (Hypertension) Father  . Diabetes Father   Review of Systems  Review of Systems  Constitutional: Negative for chills, diaphoresis, fever, malaise/fatigue and weight loss.  HENT: Negative for congestion, ear discharge, hearing loss and tinnitus.  Eyes: Negative for blurred vision.  Respiratory:  Positive for shortness of breath. Negative for cough, hemoptysis, sputum production and wheezing.  Cardiovascular: Positive for chest pain and leg swelling. Negative for palpitations, orthopnea, claudication and PND.  Gastrointestinal: Negative for abdominal pain, blood in stool, constipation, diarrhea, heartburn, melena, nausea and vomiting.  Genitourinary: Negative for dysuria, frequency, hematuria and urgency.  Musculoskeletal: Negative for back pain, falls,  joint pain and myalgias.  Skin: Negative for itching and rash.  Neurological: Negative for dizziness, tingling, focal weakness, loss of consciousness, weakness and headaches.  Endo/Heme/Allergies: Negative for polydipsia. Does not bruise/bleed easily.  Psychiatric/Behavioral: Negative for depression, memory loss and substance abuse. The patient is not nervous/anxious.   Physical Examination   Vitals:BP 130/84  Pulse 57  Resp 16  Ht 190.5 cm (6\' 3" )  Wt (!) 121.6 kg (268 lb)  SpO2 96%  BMI 33.50 kg/m  Ht:190.5 cm (6\' 3" ) Wt:(!) 121.6 kg (268 lb) EHU:DJSH surface area is 2.54 meters squared. Body mass index is 33.5 kg/m.  Wt Readings from Last 3 Encounters:  11/08/19 (!) 121.6 kg (268 lb)  03/21/16 (!) 124.7 kg (275 lb)  11/28/14 (!) 116.1 kg (256 lb)   BP Readings from Last 3 Encounters:  11/08/19 130/84  03/21/16 136/80  11/28/14 126/80   General appearance appears in no acute distress  Head Mouth and Eye exam Normocephalic, without obvious abnormality, atraumatic Dentition is good Eyes appear anicteric   Neck exam Thyroid: normal  Nodes: no obvious adenopathy  LUNGS Breath Sounds: Normal Percussion: Normal  CARDIOVASCULAR JVP CV wave: no HJR: no Elevation at 90 degrees: None Carotid Pulse: normal pulsation bilaterally Bruit: None Apex: apical impulse normal  Auscultation Rhythm: sinus bradycardia, rate=55 S1: normal S2: normal Clicks: no Rub: no Murmurs: no murmurs  Gallop: None ABDOMEN Liver enlargement: no Pulsatile aorta: no Ascites: no Bruits: no  EXTREMITIES Clubbing: no Edema: 1+ bilateral pedal edema Pulses: peripheral pulses symmetrical Femoral Bruits: no Amputation: no SKIN Rash: no Cyanosis: no Embolic phemonenon: no Bruising: no NEURO Alert and Oriented to person, place and time: yes Non focal: yes  PSYCH: Pt appears to have normal affect  Diagnostic Studies Reviewed:  EKG EKG demonstrated normal sinus rhythm,  nonspecific ST and T waves changes.  Assessment and Plan   69 y.o. male with  ICD-10-CM ICD-9-CM  1. SOB (shortness of breath)-etiology of shortness of breath is unclear. EKG shows sinus bradycardia. Will reduce his metoprolol to 25 daily and schedule cardiac cath given his chest discomfort. R06.02 786.05   2. Atypical chest pain-patient does have some exertional discomfort and fatigue. May be somewhat related to bradycardia however also has symptoms very similar to his angina. Risk and benefit left cardiac cath were explained. Plan to proceed with left heart cath to evaluate coronary anatomy given symptoms and prior PCI with similar findings. Further recommendations after cath is completed. R07.89 786.59   3. Familial hyperlipidemia-continue with Crestor with LDL goal of less than 100 E78.4 272.4  4. Essential hypertension, benign-continue with current meds for blood pressure including DASH diet I10 401.1  5. Type 2 diabetes mellitus with complication, without long-term current use of insulin , unspecified(CMS-HCC)-continue with oral agents and ADA diet as well as weight loss E11.8 250.90   Return in about 4 weeks (around 12/06/2019).  These notes generated with voice recognition software. I apologize for typographical errors.  Sydnee Levans, MD    Pt seen and examined. No change from above.

## 2019-11-17 NOTE — Progress Notes (Signed)
Pt is ready to be discharged. Per Dr Ubaldo Glassing, please keep pt in recovery until the consulting surgeon returns information to Dr Ubaldo Glassing. Dr Ubaldo Glassing will then update the pt on the plan of care. Wife at bedside. Pt blood pressure is now improved. Pt has eaten a tray of food and tolerating PO liquids. Pt resting quietly.

## 2019-11-17 NOTE — Progress Notes (Signed)
Dr Ubaldo Glassing in to update pt. Pt and wife deny any needs at this time.

## 2019-11-17 NOTE — Discharge Instructions (Signed)
Angiogram, Care After This sheet gives you information about how to care for yourself after your procedure. Your doctor may also give you more specific instructions. If you have problems or questions, contact your doctor. Follow these instructions at home: Insertion site care  Follow instructions from your doctor about how to take care of your long, thin tube (catheter) insertion area. Make sure you: ? Wash your hands with soap and water before you change your bandage (dressing). If you cannot use soap and water, use hand sanitizer. ? Change your bandage as told by your doctor. ? Leave stitches (sutures), skin glue, or skin tape (adhesive) strips in place. They may need to stay in place for 2 weeks or longer. If tape strips get loose and curl up, you may trim the loose edges. Do not remove tape strips completely unless your doctor says it is okay.  Do not take baths, swim, or use a hot tub until your doctor says it is okay.  You may shower 24-48 hours after the procedure or as told by your doctor. ? Gently wash the area with plain soap and water. ? Pat the area dry with a clean towel. ? Do not rub the area. This may cause bleeding.  Do not apply powder or lotion to the area. Keep the area clean and dry.  Check your insertion area every day for signs of infection. Check for: ? More redness, swelling, or pain. ? Fluid or blood. ? Warmth. ? Pus or a bad smell. Activity  Rest as told by your doctor, usually for 1-2 days.  Do not lift anything that is heavier than 10 lbs. (4.5 kg) or as told by your doctor.  Do not drive for 24 hours if you were given a medicine to help you relax (sedative).  Do not drive or use heavy machinery while taking prescription pain medicine. General instructions   Go back to your normal activities as told by your doctor, usually in about a week. Ask your doctor what activities are safe for you.  If the insertion area starts to bleed, lie flat and put  pressure on the area. If the bleeding does not stop, get help right away. This is an emergency.  Drink enough fluid to keep your pee (urine) clear or pale yellow.  Take over-the-counter and prescription medicines only as told by your doctor.  Keep all follow-up visits as told by your doctor. This is important. Contact a doctor if:  You have a fever.  You have chills.  You have more redness, swelling, or pain around your insertion area.  You have fluid or blood coming from your insertion area.  The insertion area feels warm to the touch.  You have pus or a bad smell coming from your insertion area.  You have more bruising around the insertion area.  Blood collects in the tissue around the insertion area (hematoma) that may be painful to the touch. Get help right away if:  You have a lot of pain in the insertion area.  The insertion area swells very fast.  The insertion area is bleeding, and the bleeding does not stop after holding steady pressure on the area.  The area near or just beyond the insertion area becomes pale, cool, tingly, or numb. These symptoms may be an emergency. Do not wait to see if the symptoms will go away. Get medical help right away. Call your local emergency services (911 in the [place] Do not drive yourself to  the hospital. Summary  After the procedure, it is common to have bruising and tenderness at the long, thin tube insertion area.  After the procedure, it is important to rest and drink plenty of fluids.  Do not take baths, swim, or use a hot tub until your doctor says it is okay to do so. You may shower 24-48 hours after the procedure or as told by your doctor.  If the insertion area starts to bleed, lie flat and put pressure on the area. If the bleeding does not stop, get help right away. This is an emergency. This information is not intended to replace advice given to you by your health care provider. Make sure you discuss any questions you have  with your health care provider. Document Revised: 01/03/2017 Document Reviewed: 01/16/2016 Elsevier Patient Education  [place]. Moderate Conscious Sedation, Adult, Care After These instructions provide you with information about caring for yourself after your procedure. Your health care provider may also give you more specific instructions. Your treatment has been planned according to current medical practices, but problems sometimes occur. Call your health care provider if you have any problems or questions after your procedure. What can I expect after the procedure? After your procedure, it is common:  To feel sleepy for several hours.  To feel clumsy and have poor balance for several hours.  To have poor judgment for several hours.  To vomit if you eat too soon. Follow these instructions at home: For at least 24 hours after the procedure:   Do not: ? Participate in activities where you could fall or become injured. ? Drive. ? Use heavy machinery. ? Drink alcohol. ? Take sleeping pills or medicines that cause drowsiness. ? Make important decisions or sign legal documents. ? Take care of children on your own.  Rest. Eating and drinking  Follow the diet recommended by your health care provider.  If you vomit: ? Drink water, juice, or soup when you can drink without vomiting. ? Make sure you have little or no nausea before eating solid foods. General instructions  Have a responsible adult stay with you until you are awake and alert.  Take over-the-counter and prescription medicines only as told by your health care provider.  If you smoke, do not smoke without supervision.  Keep all follow-up visits as told by your health care provider. This is important. Contact a health care provider if:  You keep feeling nauseous or you keep vomiting.  You feel light-headed.  You develop a rash.  You have a fever. Get help right away if:  You have trouble  breathing. This information is not intended to replace advice given to you by your health care provider. Make sure you discuss any questions you have with your health care provider. Document Revised: 01/03/2017 Document Reviewed: 05/13/2015 Elsevier Patient Education  [place].   Femoral Site Care This sheet gives you information about how to care for yourself after your procedure. Your health care provider may also give you more specific instructions. If you have problems or questions, contact your health care provider. What can I expect after the procedure? After the procedure, it is common to have:  Bruising that usually fades within 1-2 weeks.  Tenderness at the site. Follow these instructions at home: Wound care  Follow instructions from your health care provider about how to take care of your insertion site. Make sure you: ? Wash your hands with soap and water before you change your  bandage (dressing). If soap and water are not available, use hand sanitizer. ? Change your dressing as told by your health care provider. ? Leave stitches (sutures), skin glue, or adhesive strips in place. These skin closures may need to stay in place for 2 weeks or longer. If adhesive strip edges start to loosen and curl up, you may trim the loose edges. Do not remove adhesive strips completely unless your health care provider tells you to do that.  Do not take baths, swim, or use a hot tub until your health care provider approves.  You may shower 24-48 hours after the procedure or as told by your health care provider. ? Gently wash the site with plain soap and water. ? Pat the area dry with a clean towel. ? Do not rub the site. This may cause bleeding.  Do not apply powder or lotion to the site. Keep the site clean and dry.  Check your femoral site every day for signs of infection. Check for: ? Redness, swelling, or pain. ? Fluid or blood. ? Warmth. ? Pus or a bad  smell. Activity  For the first 2-3 days after your procedure, or as long as directed: ? Avoid climbing stairs as much as possible. ? Do not squat.  Do not lift anything that is heavier than 10 lb (4.5 kg), or the limit that you are told, until your health care provider says that it is safe.  Rest as directed. ? Avoid sitting for a long time without moving. Get up to take short walks every 1-2 hours.  Do not drive for 24 hours if you were given a medicine to help you relax (sedative). General instructions  Take over-the-counter and prescription medicines only as told by your health care provider.  Keep all follow-up visits as told by your health care provider. This is important. Contact a health care provider if you have:  A fever or chills.  You have redness, swelling, or pain around your insertion site. Get help right away if:  The catheter insertion area swells very fast.  You pass out.  You suddenly start to sweat or your skin gets clammy.  The catheter insertion area is bleeding, and the bleeding does not stop when you hold steady pressure on the area.  The area near or just beyond the catheter insertion site becomes pale, cool, tingly, or numb. These symptoms may represent a serious problem that is an emergency. Do not wait to see if the symptoms will go away. Get medical help right away. Call your local emergency services (911 in the [place] Do not drive yourself to the hospital. Summary  After the procedure, it is common to have bruising that usually fades within 1-2 weeks.  Check your femoral site every day for signs of infection.  Do not lift anything that is heavier than 10 lb (4.5 kg), or the limit that you are told, until your health care provider says that it is safe. This information is not intended to replace advice given to you by your health care provider. Make sure you discuss any questions you have with your health care provider. Document Revised:  02/03/2017 Document Reviewed: 02/03/2017 Elsevier Patient Education  2020 [employer].

## 2019-11-25 DIAGNOSIS — Z794 Long term (current) use of insulin: Secondary | ICD-10-CM | POA: Diagnosis not present

## 2019-11-25 DIAGNOSIS — F419 Anxiety disorder, unspecified: Secondary | ICD-10-CM | POA: Diagnosis not present

## 2019-11-25 DIAGNOSIS — E7849 Other hyperlipidemia: Secondary | ICD-10-CM | POA: Diagnosis not present

## 2019-11-25 DIAGNOSIS — Z7984 Long term (current) use of oral hypoglycemic drugs: Secondary | ICD-10-CM | POA: Diagnosis not present

## 2019-11-25 DIAGNOSIS — Z23 Encounter for immunization: Secondary | ICD-10-CM | POA: Diagnosis not present

## 2019-11-25 DIAGNOSIS — Z4682 Encounter for fitting and adjustment of non-vascular catheter: Secondary | ICD-10-CM | POA: Diagnosis not present

## 2019-11-25 DIAGNOSIS — R918 Other nonspecific abnormal finding of lung field: Secondary | ICD-10-CM | POA: Diagnosis not present

## 2019-11-25 DIAGNOSIS — D649 Anemia, unspecified: Secondary | ICD-10-CM | POA: Diagnosis not present

## 2019-11-25 DIAGNOSIS — Z951 Presence of aortocoronary bypass graft: Secondary | ICD-10-CM | POA: Diagnosis not present

## 2019-11-25 DIAGNOSIS — E1169 Type 2 diabetes mellitus with other specified complication: Secondary | ICD-10-CM | POA: Diagnosis not present

## 2019-11-25 DIAGNOSIS — J984 Other disorders of lung: Secondary | ICD-10-CM | POA: Diagnosis not present

## 2019-11-25 DIAGNOSIS — E118 Type 2 diabetes mellitus with unspecified complications: Secondary | ICD-10-CM | POA: Diagnosis not present

## 2019-11-25 DIAGNOSIS — I10 Essential (primary) hypertension: Secondary | ICD-10-CM | POA: Diagnosis not present

## 2019-11-25 DIAGNOSIS — Z20822 Contact with and (suspected) exposure to covid-19: Secondary | ICD-10-CM | POA: Diagnosis not present

## 2019-11-25 DIAGNOSIS — D72829 Elevated white blood cell count, unspecified: Secondary | ICD-10-CM | POA: Diagnosis not present

## 2019-11-25 DIAGNOSIS — E113399 Type 2 diabetes mellitus with moderate nonproliferative diabetic retinopathy without macular edema, unspecified eye: Secondary | ICD-10-CM | POA: Diagnosis not present

## 2019-11-25 DIAGNOSIS — E669 Obesity, unspecified: Secondary | ICD-10-CM | POA: Diagnosis not present

## 2019-11-25 DIAGNOSIS — I252 Old myocardial infarction: Secondary | ICD-10-CM | POA: Diagnosis not present

## 2019-11-25 DIAGNOSIS — Z6830 Body mass index (BMI) 30.0-30.9, adult: Secondary | ICD-10-CM | POA: Diagnosis not present

## 2019-11-25 DIAGNOSIS — Z452 Encounter for adjustment and management of vascular access device: Secondary | ICD-10-CM | POA: Diagnosis not present

## 2019-11-25 DIAGNOSIS — J90 Pleural effusion, not elsewhere classified: Secondary | ICD-10-CM | POA: Diagnosis not present

## 2019-11-25 DIAGNOSIS — I255 Ischemic cardiomyopathy: Secondary | ICD-10-CM | POA: Diagnosis not present

## 2019-11-25 DIAGNOSIS — Z87892 Personal history of anaphylaxis: Secondary | ICD-10-CM | POA: Diagnosis not present

## 2019-11-25 DIAGNOSIS — G8918 Other acute postprocedural pain: Secondary | ICD-10-CM | POA: Diagnosis not present

## 2019-11-25 DIAGNOSIS — J9811 Atelectasis: Secondary | ICD-10-CM | POA: Diagnosis not present

## 2019-11-25 DIAGNOSIS — F325 Major depressive disorder, single episode, in full remission: Secondary | ICD-10-CM | POA: Diagnosis not present

## 2019-11-25 DIAGNOSIS — Z79899 Other long term (current) drug therapy: Secondary | ICD-10-CM | POA: Diagnosis not present

## 2019-11-25 DIAGNOSIS — R5381 Other malaise: Secondary | ICD-10-CM | POA: Diagnosis not present

## 2019-11-25 DIAGNOSIS — Z95818 Presence of other cardiac implants and grafts: Secondary | ICD-10-CM | POA: Diagnosis not present

## 2019-11-25 DIAGNOSIS — Z886 Allergy status to analgesic agent status: Secondary | ICD-10-CM | POA: Diagnosis not present

## 2019-11-25 DIAGNOSIS — I2510 Atherosclerotic heart disease of native coronary artery without angina pectoris: Secondary | ICD-10-CM | POA: Diagnosis not present

## 2019-11-25 DIAGNOSIS — R110 Nausea: Secondary | ICD-10-CM | POA: Diagnosis not present

## 2019-11-25 DIAGNOSIS — F17220 Nicotine dependence, chewing tobacco, uncomplicated: Secondary | ICD-10-CM | POA: Diagnosis not present

## 2019-11-27 DIAGNOSIS — R110 Nausea: Secondary | ICD-10-CM

## 2019-11-27 DIAGNOSIS — G8918 Other acute postprocedural pain: Secondary | ICD-10-CM | POA: Insufficient documentation

## 2019-11-27 DIAGNOSIS — Z9889 Other specified postprocedural states: Secondary | ICD-10-CM

## 2019-11-27 HISTORY — DX: Nausea: R11.0

## 2019-11-27 HISTORY — DX: Nausea: Z98.890

## 2019-12-08 ENCOUNTER — Other Ambulatory Visit: Payer: Self-pay

## 2019-12-08 ENCOUNTER — Encounter: Payer: Self-pay | Admitting: Family Medicine

## 2019-12-08 ENCOUNTER — Ambulatory Visit (INDEPENDENT_AMBULATORY_CARE_PROVIDER_SITE_OTHER): Payer: PPO | Admitting: Family Medicine

## 2019-12-08 VITALS — BP 118/68 | HR 71 | Temp 96.9°F | Resp 16 | Ht 76.0 in | Wt 262.6 lb

## 2019-12-08 DIAGNOSIS — Z951 Presence of aortocoronary bypass graft: Secondary | ICD-10-CM | POA: Diagnosis not present

## 2019-12-08 DIAGNOSIS — E1169 Type 2 diabetes mellitus with other specified complication: Secondary | ICD-10-CM | POA: Diagnosis not present

## 2019-12-08 DIAGNOSIS — Z9189 Other specified personal risk factors, not elsewhere classified: Secondary | ICD-10-CM | POA: Diagnosis not present

## 2019-12-08 DIAGNOSIS — I2510 Atherosclerotic heart disease of native coronary artery without angina pectoris: Secondary | ICD-10-CM | POA: Diagnosis not present

## 2019-12-08 NOTE — Progress Notes (Signed)
Subjective:    Patient ID: Kevin Perry, male    DOB: [birth year], 69 y.o.   MRN: [id number]  Kevin Perry is a 69 y.o. male presenting on 12/08/2019 for Hospitalization Follow-up (CAD)   HPI  HOSPITAL FOLLOW-UP VISIT  Hospital/Location: [facility] Date of Admission: 11/25/19 Date of Discharge: 11/30/19 Transitions of care telephone call: Not completed.  Reason for Admission: CAD, CABG  - Hospital H&P and Discharge Summary have been reviewed - Patient presents today hospital follow-up, discharged on 10/26 after recent hospitalization. Brief summary of recent course, patient had symptoms of initially chest discomfort and fatigue dyspnea, treated with follow up with Dr Ubaldo Glassing > proceeded with cardiac cath, found blockages, ultimately referred to [facility] Cardiothoracic Dr Silvio Pate, and had CABG x 4 vessel. - History of NSTEMI in 2008, had DES.  He has done very well post op. He has limited his pain medicine now, and doing only Tylenol x 2 q 6 hours, he is avoiding laying flat as it bothers him, he has soreness in ribcage / chest incision, but he is improving.  He can feel major improvement overall with chest pressure and breathing. He still feels generalized weakness from the surgery but that is improving.   S/P coronary artery bypass graft x 4;  S/P CABG x 4  He was on Rosuvastatin 10mg , then they switched it to Atorvastatin 10mg   He is on lasix diuretic and on Potassium 21mEq twice a day while on lasix. >30 days.  He has follow-up on Monday with Cardiothoracic surgery [facility], for labs, CXR, and visit.  No hypoglycemia  glipizide 10 mg daily, metformin XR 1,000 mg breakfast and 323 mg dinner, Trulicity 1.5 mg injection     I have reviewed the discharge medication list, and have reconciled the current and discharge medications today.   Current Outpatient Medications:  .  acetaminophen (TYLENOL) 325 MG tablet, Take by mouth., Disp: , Rfl:  .  amLODipine (NORVASC) 10 MG tablet,  TAKE 1 TABLET BY MOUTH ONCE DAILY (Patient taking differently: Take 10 mg by mouth daily. ), Disp: 90 tablet, Rfl: 3 .  aspirin 81 MG chewable tablet, Chew by mouth., Disp: , Rfl:  .  atorvastatin (LIPITOR) 40 MG tablet, Take 40 mg by mouth daily., Disp: , Rfl:  .  escitalopram (LEXAPRO) 20 MG tablet, TAKE 1 TABLET BY MOUTH AT BEDTIME (Patient taking differently: Take 20 mg by mouth at bedtime. ), Disp: 90 tablet, Rfl: 3 .  fluticasone (FLONASE) 50 MCG/ACT nasal spray, Place 2 sprays into both nostrils daily. Use for 4-6 weeks then stop and use seasonally or as needed. (Patient taking differently: Place 2 sprays into both nostrils daily as needed for allergies. ), Disp: 16 g, Rfl: 3 .  folic acid (FOLVITE) 1 MG tablet, Take 1 mg by mouth See admin instructions. Take 1 mg daily except skip dose on Thursdays (methotrexate day), Disp: , Rfl:  .  furosemide (LASIX) 40 MG tablet, TAKE 1 TABLET BY MOUTH ONCE DAILY AS NEEDED (Patient taking differently: Take 40 mg by mouth daily. ), Disp: 90 tablet, Rfl: 3 .  glipiZIDE (GLUCOTROL) 10 MG tablet, TAKE 1 TABLET BY MOUTH ONCE DAILY WITH BREAKFAST (Patient taking differently: Take 10 mg by mouth daily before breakfast. ), Disp: 90 tablet, Rfl: 0 .  Insulin Pen Needle 31G X 5 MM MISC, 1 each by Does not apply route daily., Disp: 100 each, Rfl: 11 .  lisinopril (ZESTRIL) 20 MG tablet, TAKE 1 TABLET BY MOUTH ONCE  EVERY MORNING (Patient taking differently: Take 20 mg by mouth daily. ), Disp: 90 tablet, Rfl: 3 .  loratadine (CLARITIN) 10 MG tablet, Take 10 mg by mouth every morning., Disp: , Rfl:  .  metFORMIN (GLUCOPHAGE) 500 MG tablet, TAKE 2 TABLETS BY MOUTH ONCE EVERY MORNING WITH BREAKFAST AND 1 TABLET WITHDINNER (Patient taking differently: Take 500-1,000 mg by mouth See admin instructions. Take 1000 mg in the morning and 500 mg in the evening), Disp: 270 tablet, Rfl: 1 .  metoprolol tartrate (LOPRESSOR) 25 MG tablet, Take by mouth., Disp: , Rfl:  .  nitroGLYCERIN  (NITROSTAT) 0.4 MG SL tablet, Place under the tongue., Disp: , Rfl:  .  oxyCODONE (OXY IR/ROXICODONE) 5 MG immediate release tablet, SMARTSIG:1 Tablet(s) By Mouth 4-5 Times Daily, Disp: , Rfl:  .  polyethylene glycol powder (GLYCOLAX/MIRALAX) 17 GM/SCOOP powder, Take by mouth., Disp: , Rfl:  .  potassium chloride SA (KLOR-CON) 20 MEQ tablet, Take by mouth., Disp: , Rfl:  .  TRULICITY 1.5 WG/6.6ZL SOPN, INJECT 1.5MG  INTO SKIN ONCE WEEKLY (Patient taking differently: Inject 1.5 mg as directed every Wednesday. ), Disp: 6 mL, Rfl: 3 .  methotrexate (RHEUMATREX) 2.5 MG tablet, Take 7.5 mg by mouth every Thursday.  (Patient not taking: Reported on 12/08/2019), Disp: , Rfl:  .  metoprolol succinate (TOPROL-XL) 50 MG 24 hr tablet, TAKE 1 TABLET BY MOUTH ONCE DAILY (Patient not taking: Reported on 12/08/2019), Disp: 90 tablet, Rfl: 3 No current facility-administered medications for this visit.  Facility-Administered Medications Ordered in Other Visits:  .  sodium chloride flush (NS) 0.9 % injection 3 mL, 3 mL, Intravenous, Q12H, Fath, Javier Docker, MD  ------------------------------------------------------------------------- Social History   Tobacco Use  . Smoking status: Former Smoker    Quit date: 08/16/2004    Years since quitting: 15.3  . Smokeless tobacco: Former Systems developer  . Tobacco comment: quit 20 plus years  Vaping Use  . Vaping Use: Never used  Substance Use Topics  . Alcohol use: Yes    Alcohol/week: 2.0 standard drinks    Types: 2 Cans of beer per week    Comment: SOCIAL  . Drug use: No    Review of Systems Per HPI unless specifically indicated above     Objective:    BP 118/68   Pulse 71   Temp (!) 96.9 F (36.1 C) (Temporal)   Resp 16   Ht 6\' 4"  (1.93 m)   Wt 262 lb 9.6 oz (119.1 kg)   SpO2 96%   BMI 31.96 kg/m   Wt Readings from Last 3 Encounters:  12/08/19 262 lb 9.6 oz (119.1 kg)  11/17/19 260 lb (117.9 kg)  08/10/19 270 lb 9.6 oz (122.7 kg)    Physical Exam Vitals  and nursing note reviewed.  Constitutional:      General: He is not in acute distress.    Appearance: He is well-developed. He is not diaphoretic.     Comments: Well-appearing, comfortable, cooperative  HENT:     Head: Normocephalic and atraumatic.  Eyes:     General:        Right eye: No discharge.        Left eye: No discharge.     Conjunctiva/sclera: Conjunctivae normal.  Neck:     Thyroid: No thyromegaly.  Cardiovascular:     Rate and Rhythm: Normal rate and regular rhythm.     Heart sounds: Normal heart sounds. No murmur heard.   Pulmonary:     Effort: Pulmonary effort is  normal. No respiratory distress.     Breath sounds: Normal breath sounds. No wheezing or rales.  Musculoskeletal:        General: Normal range of motion.     Cervical back: Normal range of motion and neck supple.     Comments: Chest soreness from ribs / surgery  Lymphadenopathy:     Cervical: No cervical adenopathy.  Skin:    General: Skin is warm and dry.     Findings: No erythema or rash.  Neurological:     Mental Status: He is alert and oriented to person, place, and time.  Psychiatric:        Behavior: Behavior normal.     Comments: Well groomed, good eye contact, normal speech and thoughts       Results for orders placed or performed during the hospital encounter of 11/17/19  Glucose, capillary  Result Value Ref Range   Glucose-Capillary 167 (H) 70 - 99 mg/dL      Assessment & Plan:   Problem List Items Addressed This Visit    Type 2 diabetes mellitus with other specified complication (HCC)   Relevant Medications   atorvastatin (LIPITOR) 40 MG tablet   aspirin 81 MG chewable tablet   S/P CABG x 4   Coronary artery disease involving native coronary artery of native heart without angina pectoris - Primary   Relevant Medications   atorvastatin (LIPITOR) 40 MG tablet   aspirin 81 MG chewable tablet   nitroGLYCERIN (NITROSTAT) 0.4 MG SL tablet   metoprolol tartrate (LOPRESSOR) 25 MG  tablet    Other Visit Diagnoses    At increased risk of exposure to COVID-19 virus       Relevant Orders   SARS-CoV-2 Semi-Quantitative Total Antibody, Spike      #CAD, s/p CABG x4, HLD Managed by [facility] Cardiothoracic Dr Silvio Pate, Dr Ubaldo Glassing [facility] Cards Improving anginal symptoms now post op Good recovery, no obvious complication Heomdynamically stable Proceed with treatment post-op as scheduled No changes to treatment today - He should continue Atorvastatin as switched by his team instead of Rosuvastatin.  #T2DM Continue current DM regimen.  Check covid antibody level regarding booster dosing.   No orders of the defined types were placed in this encounter.   Follow up plan: Return if symptoms worsen or fail to improve, for Re-schedule his upcoming lab and physical - push back by 1-2 weeks, should be after 02/16/20.   [name], [facility] 12/08/2019, 11:40 AM

## 2019-12-08 NOTE — Patient Instructions (Addendum)
Thank you for coming to the office today.  Keep on right track with current regimen.  Stay to potassium as long as you are on fluid pill diuretic  Stay on Atorvastatin. Remain off Rosuvastatin.  We can check sugar with next labs in 02/2020  Covid antibody ordered, can draw here when ready, schedule lab only, non fasting.  If Antibody level >1000 then you have good immunity if lower, then may be due for booster, now >6 month after   Please schedule a Follow-up Appointment to: Return if symptoms worsen or fail to improve, for Re-schedule his upcoming lab and physical - push back by 1-2 weeks, should be after 02/16/20.  If you have any other questions or concerns, please feel free to call the office or send a message through [place]. You may also schedule an earlier appointment if necessary.  Additionally, you may be receiving a survey about your experience at our office within a few days to 1 week by e-mail or mail. We value your feedback.  [name], DO [place]

## 2019-12-09 ENCOUNTER — Other Ambulatory Visit: Payer: Self-pay | Admitting: *Deleted

## 2019-12-09 DIAGNOSIS — I2510 Atherosclerotic heart disease of native coronary artery without angina pectoris: Secondary | ICD-10-CM

## 2019-12-09 DIAGNOSIS — Z0000 Encounter for general adult medical examination without abnormal findings: Secondary | ICD-10-CM

## 2019-12-09 DIAGNOSIS — E785 Hyperlipidemia, unspecified: Secondary | ICD-10-CM

## 2019-12-09 DIAGNOSIS — E669 Obesity, unspecified: Secondary | ICD-10-CM

## 2019-12-09 DIAGNOSIS — E1169 Type 2 diabetes mellitus with other specified complication: Secondary | ICD-10-CM

## 2019-12-09 DIAGNOSIS — I10 Essential (primary) hypertension: Secondary | ICD-10-CM

## 2019-12-09 DIAGNOSIS — R351 Nocturia: Secondary | ICD-10-CM

## 2019-12-10 ENCOUNTER — Other Ambulatory Visit: Payer: PPO

## 2019-12-10 DIAGNOSIS — Z9189 Other specified personal risk factors, not elsewhere classified: Secondary | ICD-10-CM | POA: Diagnosis not present

## 2019-12-13 DIAGNOSIS — Z9889 Other specified postprocedural states: Secondary | ICD-10-CM | POA: Diagnosis not present

## 2019-12-13 DIAGNOSIS — J90 Pleural effusion, not elsewhere classified: Secondary | ICD-10-CM | POA: Diagnosis not present

## 2019-12-13 DIAGNOSIS — Z7982 Long term (current) use of aspirin: Secondary | ICD-10-CM | POA: Diagnosis not present

## 2019-12-13 DIAGNOSIS — I4891 Unspecified atrial fibrillation: Secondary | ICD-10-CM | POA: Diagnosis not present

## 2019-12-13 DIAGNOSIS — R9431 Abnormal electrocardiogram [ECG] [EKG]: Secondary | ICD-10-CM | POA: Diagnosis not present

## 2019-12-13 DIAGNOSIS — E119 Type 2 diabetes mellitus without complications: Secondary | ICD-10-CM | POA: Diagnosis not present

## 2019-12-13 DIAGNOSIS — Z7901 Long term (current) use of anticoagulants: Secondary | ICD-10-CM | POA: Diagnosis not present

## 2019-12-13 DIAGNOSIS — Z951 Presence of aortocoronary bypass graft: Secondary | ICD-10-CM | POA: Diagnosis not present

## 2019-12-13 DIAGNOSIS — Z48812 Encounter for surgical aftercare following surgery on the circulatory system: Secondary | ICD-10-CM | POA: Diagnosis not present

## 2019-12-13 LAB — SARS-COV-2 SEMI-QUANTITATIVE TOTAL ANTIBODY, SPIKE: SARS COV2 AB, Total Spike Semi QN: >2500 U/mL — ABNORMAL HIGH (ref <0.8)

## 2019-12-16 DIAGNOSIS — E785 Hyperlipidemia, unspecified: Secondary | ICD-10-CM | POA: Diagnosis not present

## 2019-12-16 DIAGNOSIS — Z951 Presence of aortocoronary bypass graft: Secondary | ICD-10-CM | POA: Diagnosis not present

## 2019-12-16 DIAGNOSIS — E1169 Type 2 diabetes mellitus with other specified complication: Secondary | ICD-10-CM | POA: Diagnosis not present

## 2019-12-16 DIAGNOSIS — E118 Type 2 diabetes mellitus with unspecified complications: Secondary | ICD-10-CM | POA: Diagnosis not present

## 2019-12-16 DIAGNOSIS — I10 Essential (primary) hypertension: Secondary | ICD-10-CM | POA: Diagnosis not present

## 2019-12-16 DIAGNOSIS — I2510 Atherosclerotic heart disease of native coronary artery without angina pectoris: Secondary | ICD-10-CM | POA: Diagnosis not present

## 2019-12-19 ENCOUNTER — Other Ambulatory Visit: Payer: Self-pay | Admitting: Family Medicine

## 2019-12-19 DIAGNOSIS — I10 Essential (primary) hypertension: Secondary | ICD-10-CM

## 2019-12-19 NOTE — Telephone Encounter (Signed)
Requested Prescriptions  Pending Prescriptions Disp Refills  . amLODipine (NORVASC) 10 MG tablet [Pharmacy Med Name: AMLODIPINE BESYLATE 10 MG TAB] 90 tablet 0    Sig: TAKE 1 TABLET BY MOUTH ONCE DAILY     Cardiovascular:  Calcium Channel Blockers Passed - 12/19/2019  3:43 PM      Passed - Last BP in normal range    BP Readings from Last 1 Encounters:  12/08/19 118/68         Passed - Valid encounter within last 6 months    Recent Outpatient Visits          1 week ago Coronary artery disease involving native coronary artery of native heart without angina pectoris   [place], DO   4 months ago Type 2 diabetes mellitus with other specified complication, without long-term current use of insulin [facility])   [facility] [place], [name], DO   10 months ago Annual physical exam   [place], [name], DO   1 year ago Type 2 diabetes mellitus with other specified complication, without long-term current use of insulin [facility])   [facility], [name], DO   1 year ago Type 2 diabetes mellitus with other specified complication, without long-term current use of insulin ([place])   [facility] [place], [name], DO      Future Appointments            In 2 months [place], [name], DO [facility] [place]           . glipiZIDE (GLUCOTROL) 10 MG tablet [Pharmacy Med Name: GLIPIZIDE 10 MG TAB] 90 tablet 0    Sig: TAKE 1 TABLET BY MOUTH ONCE DAILY WITH BREAKFAST     Endocrinology:  Diabetes - Sulfonylureas Passed - 12/19/2019  3:43 PM      Passed - HBA1C is between 0 and 7.9 and within 180 days    Hemoglobin A1C  Date Value Ref Range Status  08/10/2019 7.2 (A) 4.0 - 5.6 % Final  07/27/2012 7.6 (H) 4.2 - 6.3 % Final    Comment:    The American Diabetes Association recommends that a primary goal of therapy should be <7% and that  physicians should reevaluate the treatment regimen in patients with HbA1c values consistently >8%.    Hgb A1c MFr Bld  Date Value Ref Range Status  02/11/2019 8.5 (H) <5.7 % of total Hgb Final    Comment:    For someone without known diabetes, a hemoglobin A1c value of 6.5% or greater indicates that they may have  diabetes and this should be confirmed with a follow-up  test. . For someone with known diabetes, a value <7% indicates  that their diabetes is well controlled and a value  greater than or equal to 7% indicates suboptimal  control. A1c targets should be individualized based on  duration of diabetes, age, comorbid conditions, and  other considerations. . Currently, no consensus exists regarding use of hemoglobin A1c for diagnosis of diabetes for children. Renella Cunas - Valid encounter within last 6 months    Recent Outpatient Visits          1 week ago Coronary artery disease involving native coronary artery of native heart without angina pectoris   [place], DO   4 months ago Type 2 diabetes mellitus  with other specified complication, without long-term current use of insulin [facility])   [facility], [name], DO   10 months ago Annual physical exam   [facility] [place], [name], DO   1 year ago Type 2 diabetes mellitus with other specified complication, without long-term current use of insulin [facility])   [facility] [name], DO   1 year ago Type 2 diabetes mellitus with other specified complication, without long-term current use of insulin ([place])   [facility] [place], [name], DO      Future Appointments            In 2 months [place], [name], DO [facility], [facility]

## 2019-12-25 ENCOUNTER — Other Ambulatory Visit: Payer: Self-pay | Admitting: Family Medicine

## 2019-12-25 DIAGNOSIS — E1169 Type 2 diabetes mellitus with other specified complication: Secondary | ICD-10-CM

## 2020-01-13 DIAGNOSIS — I872 Venous insufficiency (chronic) (peripheral): Secondary | ICD-10-CM | POA: Diagnosis not present

## 2020-01-13 DIAGNOSIS — I252 Old myocardial infarction: Secondary | ICD-10-CM | POA: Diagnosis not present

## 2020-01-13 DIAGNOSIS — I2510 Atherosclerotic heart disease of native coronary artery without angina pectoris: Secondary | ICD-10-CM | POA: Diagnosis not present

## 2020-01-13 DIAGNOSIS — I4891 Unspecified atrial fibrillation: Secondary | ICD-10-CM | POA: Diagnosis not present

## 2020-01-13 DIAGNOSIS — Z951 Presence of aortocoronary bypass graft: Secondary | ICD-10-CM | POA: Diagnosis not present

## 2020-01-13 DIAGNOSIS — E118 Type 2 diabetes mellitus with unspecified complications: Secondary | ICD-10-CM | POA: Diagnosis not present

## 2020-01-13 DIAGNOSIS — I10 Essential (primary) hypertension: Secondary | ICD-10-CM | POA: Diagnosis not present

## 2020-01-13 DIAGNOSIS — I9789 Other postprocedural complications and disorders of the circulatory system, not elsewhere classified: Secondary | ICD-10-CM | POA: Diagnosis not present

## 2020-01-13 DIAGNOSIS — E1169 Type 2 diabetes mellitus with other specified complication: Secondary | ICD-10-CM | POA: Diagnosis not present

## 2020-01-13 DIAGNOSIS — E669 Obesity, unspecified: Secondary | ICD-10-CM | POA: Diagnosis not present

## 2020-01-13 DIAGNOSIS — E7849 Other hyperlipidemia: Secondary | ICD-10-CM | POA: Diagnosis not present

## 2020-01-13 DIAGNOSIS — R002 Palpitations: Secondary | ICD-10-CM | POA: Diagnosis not present

## 2020-01-13 DIAGNOSIS — E785 Hyperlipidemia, unspecified: Secondary | ICD-10-CM | POA: Diagnosis not present

## 2020-01-18 DIAGNOSIS — I8312 Varicose veins of left lower extremity with inflammation: Secondary | ICD-10-CM | POA: Diagnosis not present

## 2020-01-18 DIAGNOSIS — L853 Xerosis cutis: Secondary | ICD-10-CM | POA: Diagnosis not present

## 2020-01-31 DIAGNOSIS — R002 Palpitations: Secondary | ICD-10-CM | POA: Diagnosis not present

## 2020-02-02 ENCOUNTER — Other Ambulatory Visit: Payer: PPO

## 2020-02-02 ENCOUNTER — Other Ambulatory Visit: Payer: Self-pay | Admitting: Family Medicine

## 2020-02-02 DIAGNOSIS — Z20822 Contact with and (suspected) exposure to covid-19: Secondary | ICD-10-CM | POA: Diagnosis not present

## 2020-02-02 DIAGNOSIS — E1169 Type 2 diabetes mellitus with other specified complication: Secondary | ICD-10-CM

## 2020-02-03 ENCOUNTER — Other Ambulatory Visit: Payer: PPO

## 2020-02-03 ENCOUNTER — Telehealth: Payer: Self-pay | Admitting: *Deleted

## 2020-02-03 NOTE — Chronic Care Management (AMB) (Signed)
°  Chronic Care Management   Outreach Note  02/03/2020 Name: Kevin Perry MRN: [id number] DOB: [birth year]  Kevin Perry is a 69 y.o. year old male who is a primary care patient of Smitty Cords, DO. I reached out to [employer] by phone today in response to a referral sent by [name]'s PCP, Althea Charon, Netta Neat, DO.    An unsuccessful telephone outreach was attempted today. The patient was referred to the case management team for assistance with care management and care coordination.   Follow Up Plan: A HIPAA compliant phone message was left for the patient providing contact information and requesting a return call. The care management team will reach out to the patient again over the next 7 days. If patient returns call to provider office, please advise to call Embedded Care Management Care Guide Gwenevere Ghazi at [phone].  Gwenevere Ghazi  Care Guide, Embedded Care Coordination [facility] Management  Direct Dial: [phone]

## 2020-02-03 NOTE — Chronic Care Management (AMB) (Signed)
Chronic Care Management   Note  02/03/2020 Name: Kevin Perry MRN: [id number] DOB: 30-Sep-1950  Kevin Perry is a 70 y.o. year old male who is a primary care patient of [name], DO. I reached out to [employer] by phone today in response to a referral sent by Kevin Perry's PCP, Parks Ranger, [name], DO.     [name] was given information about Chronic Care Management services today including:  1. CCM service includes personalized support from designated clinical staff supervised by his physician, including individualized plan of care and coordination with other care providers 2. 24/7 contact phone numbers for assistance for urgent and routine care needs. 3. Service will only be billed when office clinical staff spend 20 minutes or more in a month to coordinate care. 4. Only one practitioner may furnish and bill the service in a calendar month. 5. The patient may stop CCM services at any time (effective at the end of the month) by phone call to the office staff. 6. The patient will be responsible for cost sharing (co-pay) of up to 20% of the service fee (after annual deductible is met).  Patient agreed to services and verbal consent obtained.   Follow up plan: Telephone appointment with care management team member scheduled for:02/25/2020  [facility]  Direct Dial: [phone]

## 2020-02-04 LAB — NOVEL CORONAVIRUS, NAA: SARS-CoV-2, NAA: NOT DETECTED

## 2020-02-04 LAB — SARS-COV-2, NAA 2 DAY TAT

## 2020-02-08 ENCOUNTER — Other Ambulatory Visit: Payer: PPO

## 2020-02-08 DIAGNOSIS — Z20822 Contact with and (suspected) exposure to covid-19: Secondary | ICD-10-CM

## 2020-02-09 ENCOUNTER — Telehealth (INDEPENDENT_AMBULATORY_CARE_PROVIDER_SITE_OTHER): Payer: PPO | Admitting: Family Medicine

## 2020-02-09 ENCOUNTER — Telehealth: Payer: Self-pay | Admitting: Pharmacist

## 2020-02-09 ENCOUNTER — Encounter: Payer: Self-pay | Admitting: Family Medicine

## 2020-02-09 ENCOUNTER — Other Ambulatory Visit: Payer: Self-pay

## 2020-02-09 DIAGNOSIS — J0100 Acute maxillary sinusitis, unspecified: Secondary | ICD-10-CM | POA: Diagnosis not present

## 2020-02-09 DIAGNOSIS — N401 Enlarged prostate with lower urinary tract symptoms: Secondary | ICD-10-CM | POA: Diagnosis not present

## 2020-02-09 DIAGNOSIS — N138 Other obstructive and reflux uropathy: Secondary | ICD-10-CM

## 2020-02-09 MED ORDER — AMOXICILLIN-POT CLAVULANATE 875-125 MG PO TABS: 1.0000 | ORAL_TABLET | Freq: Two times a day (BID) | ORAL | 0 refills | Status: DC

## 2020-02-09 MED ORDER — TAMSULOSIN HCL 0.4 MG PO CAPS: 0.4000 mg | ORAL_CAPSULE | Freq: Every day | ORAL | 3 refills | Status: DC

## 2020-02-09 NOTE — Patient Instructions (Addendum)
Trial on Augmentin antibiotic Start nasal steroid Flonase 2 sprays in each nostril daily for 4-6 weeks, may repeat course seasonally or as needed  Use OTC cough medicine  Will order Tamsulosin alpha blocker trial 0.4mg  daily, caution against orthostatic hypotension  Please schedule a Follow-up Appointment to: Return in about 1 week (around 02/16/2020), or if symptoms worsen or fail to improve.  If you have any other questions or concerns, please feel free to call the office or send a message through MyChart. You may also schedule an earlier appointment if necessary.  Additionally, you may be receiving a survey about your experience at our office within a few days to 1 week by e-mail or mail. We value your feedback.  Saralyn Pilar, DO [facility], [place]

## 2020-02-09 NOTE — Progress Notes (Signed)
Virtual Visit via Telephone The purpose of this virtual visit is to provide medical care while limiting exposure to the novel coronavirus (COVID19) for both patient and office staff.  Consent was obtained for phone visit:  Yes.   Answered questions that patient had about telehealth interaction:  Yes.   I discussed the limitations, risks, security and privacy concerns of performing an evaluation and management service by telephone. I also discussed with the patient that there may be a patient responsible charge related to this service. The patient expressed understanding and agreed to proceed.  Patient Location: Home Provider Location: [facility] (Office)  Participants in virtual visit: - Patient: Kevin Perry - CMA: Frederich Cha, CMA - Provider: Dr Parks Ranger  ---------------------------------------------------------------------- Chief Complaint  Patient presents with  . Urinary Frequency    Onset couple of weeks but denies any back pain or abdominal pain or burning --urine hesitancy   . Cough    Sore throat, cough, HA little bur denies fever or SOB onset 5 days     S: Reviewed CMA documentation. I have called patient and gathered additional HPI as follows:  Sore Throat / Cough / Sinusitis  Reports symptoms onset 5-7 days approx with sinus congestion and cough headache sore throat, worsening pressure and pain, worried about it worsening. Not received COVID booster, next visit with Dr Ubaldo Glassing is tomorrow waiting to discuss booster. He was tested negative for COVID last week. Last night COVID test is pending. Taking OTC Coricidin HBP Denies fevers chills myalgias sweats dyspnea  Urinary Frequency / BPH LUTS Reports a few weeks with lower urinary symptoms urinary frequency, reduced urine output, and no prior history of BPH. Has had normal PSA Admits nocturia Denies blood in urine, dysuria burning, urgency.  Denies any known or suspected exposure to person with  or possibly with COVID19.   Past Medical History:  Diagnosis Date  . Anxiety   . Arthritis   . Coronary artery disease   . Myocardial infarction [facility])    Social History   Tobacco Use  . Smoking status: Former Smoker    Quit date: 08/16/2004    Years since quitting: 15.4  . Smokeless tobacco: Former Systems developer  . Tobacco comment: quit 20 plus years  Vaping Use  . Vaping Use: Never used  Substance Use Topics  . Alcohol use: Yes    Alcohol/week: 2.0 standard drinks    Types: 2 Cans of beer per week    Comment: SOCIAL  . Drug use: No    Current Outpatient Medications:  .  acetaminophen (TYLENOL) 325 MG tablet, Take by mouth., Disp: , Rfl:  .  amLODipine (NORVASC) 10 MG tablet, TAKE 1 TABLET BY MOUTH ONCE DAILY, Disp: 90 tablet, Rfl: 0 .  amoxicillin-clavulanate (AUGMENTIN) 875-125 MG tablet, Take 1 tablet by mouth 2 (two) times daily. For 10 days, Disp: 20 tablet, Rfl: 0 .  aspirin 81 MG chewable tablet, Chew by mouth., Disp: , Rfl:  .  atorvastatin (LIPITOR) 40 MG tablet, Take 40 mg by mouth daily., Disp: , Rfl:  .  escitalopram (LEXAPRO) 20 MG tablet, TAKE 1 TABLET BY MOUTH AT BEDTIME (Patient taking differently: Take 20 mg by mouth at bedtime.), Disp: 90 tablet, Rfl: 3 .  fluticasone (FLONASE) 50 MCG/ACT nasal spray, Place 2 sprays into both nostrils daily. Use for 4-6 weeks then stop and use seasonally or as needed. (Patient taking differently: Place 2 sprays into both nostrils daily as needed for allergies.), Disp: 16 g, Rfl:  3 .  folic acid (FOLVITE) 1 MG tablet, Take 1 mg by mouth See admin instructions. Take 1 mg daily except skip dose on Thursdays (methotrexate day), Disp: , Rfl:  .  furosemide (LASIX) 40 MG tablet, TAKE 1 TABLET BY MOUTH ONCE DAILY AS NEEDED (Patient taking differently: Take 40 mg by mouth daily.), Disp: 90 tablet, Rfl: 3 .  glipiZIDE (GLUCOTROL) 10 MG tablet, TAKE 1 TABLET BY MOUTH ONCE DAILY WITH BREAKFAST, Disp: 90 tablet, Rfl: 0 .  Insulin Pen Needle 31G X 5  MM MISC, 1 each by Does not apply route daily., Disp: 100 each, Rfl: 11 .  lisinopril (ZESTRIL) 20 MG tablet, TAKE 1 TABLET BY MOUTH ONCE EVERY MORNING (Patient taking differently: Take 20 mg by mouth daily.), Disp: 90 tablet, Rfl: 3 .  loratadine (CLARITIN) 10 MG tablet, Take 10 mg by mouth every morning., Disp: , Rfl:  .  metFORMIN (GLUCOPHAGE) 500 MG tablet, TAKE 2 TABLETS BY MOUTH ONCE EVERY MORNING WITH BREAKFAST AND 1 TABLET WITHDINNER, Disp: 270 tablet, Rfl: 1 .  methotrexate (RHEUMATREX) 2.5 MG tablet, Take 7.5 mg by mouth every Thursday., Disp: , Rfl:  .  metoprolol succinate (TOPROL-XL) 50 MG 24 hr tablet, TAKE 1 TABLET BY MOUTH ONCE DAILY, Disp: 90 tablet, Rfl: 3 .  nitroGLYCERIN (NITROSTAT) 0.4 MG SL tablet, Place under the tongue., Disp: , Rfl:  .  oxyCODONE (OXY IR/ROXICODONE) 5 MG immediate release tablet, SMARTSIG:1 Tablet(s) By Mouth 4-5 Times Daily, Disp: , Rfl:  .  polyethylene glycol powder (GLYCOLAX/MIRALAX) 17 GM/SCOOP powder, Take by mouth., Disp: , Rfl:  .  tamsulosin (FLOMAX) 0.4 MG CAPS capsule, Take 1 capsule (0.4 mg total) by mouth daily., Disp: 30 capsule, Rfl: 3 .  TRULICITY 1.5 0000000 SOPN, INJECT 1.5MG  INTO SKIN ONCE WEEKLY (Patient taking differently: Inject 1.5 mg as directed every Wednesday.), Disp: 6 mL, Rfl: 3 .  metoprolol tartrate (LOPRESSOR) 25 MG tablet, Take by mouth. (Patient not taking: Reported on 02/09/2020), Disp: , Rfl:  No current facility-administered medications for this visit.  Facility-Administered Medications Ordered in Other Visits:  .  sodium chloride flush (NS) 0.9 % injection 3 mL, 3 mL, Intravenous, Q12H, [name], MD  Depression screen [facility] 2/9 08/10/2019 02/16/2019 10/28/2018  Decreased Interest 0 0 0  Down, Depressed, Hopeless 0 0 0  PHQ - 2 Score 0 0 0  Altered sleeping 0 0 0  Tired, decreased energy 0 0 0  Change in appetite 0 0 0  Feeling bad or failure about yourself  0 0 0  Trouble concentrating 0 0 0  Moving slowly or  fidgety/restless 0 0 0  Suicidal thoughts 0 0 0  PHQ-9 Score 0 0 0  Difficult doing work/chores Not difficult at all Not difficult at all Not difficult at all  Some recent data might be hidden    GAD 7 : Generalized Anxiety Score 07/17/2018 03/12/2017 03/09/2015  Nervous, Anxious, on Edge 0 0 0  Control/stop worrying 0 0 0  Worry too much - different things 0 0 0  Trouble relaxing 0 0 0  Restless 0 0 0  Easily annoyed or irritable 0 0 0  Afraid - awful might happen 0 0 0  Total GAD 7 Score 0 0 0  Anxiety Difficulty Not difficult at all Not difficult at all Not difficult at all    -------------------------------------------------------------------------- O: No physical exam performed due to remote telephone encounter.  Lab results reviewed.  Recent Results (from the past 2160 hour(s))  SARS CORONAVIRUS 2 (TAT 6-24 HRS) Nasopharyngeal Nasopharyngeal Swab     Status: None   Collection Time: 11/15/19 10:32 AM   Specimen: Nasopharyngeal Swab  Result Value Ref Range   SARS Coronavirus 2 NEGATIVE NEGATIVE    Comment: (NOTE) SARS-CoV-2 target nucleic acids are NOT DETECTED.  The SARS-CoV-2 RNA is generally detectable in upper and lower respiratory specimens during the acute phase of infection. Negative results do not preclude SARS-CoV-2 infection, do not rule out co-infections with other pathogens, and should not be used as the sole basis for treatment or other patient management decisions. Negative results must be combined with clinical observations, patient history, and epidemiological information. The expected result is Negative.  Fact Sheet for Patients: HairSlick.no  Fact Sheet for Healthcare Providers: quierodirigir.com  This test is not yet approved or cleared by the [place] FDA and  has been authorized for detection and/or diagnosis of SARS-CoV-2 by FDA under an Emergency Use Authorization (EUA). This EUA will  remain  in effect (meaning this test can be used) for the duration of the COVID-19 declaration under Se ction 564(b)(1) of the Act, 21 U.S.C. section 360bbb-3(b)(1), unless the authorization is terminated or revoked sooner.  Performed at [facility], [address], [place], [place] [address]   Glucose, capillary     Status: Abnormal   Collection Time: 11/17/19  7:39 AM  Result Value Ref Range   Glucose-Capillary 167 (H) 70 - 99 mg/dL    Comment: Glucose reference range applies only to samples taken after fasting for at least 8 hours.  SARS-CoV-2 Semi-Quantitative Total Antibody, Spike     Status: Abnormal   Collection Time: 12/10/19 10:29 AM  Result Value Ref Range   SARS COV2 AB, Total Spike Semi QN >2,500.0 (H) <0.8 U/mL    Comment: . INDEX          INTERPRETATION -----          --------------  <0.8          Negative > or = 0.8     Positive . This test is intended to help identify individuals with antibodies to SARS-CoV-2 (COVID-19). The results of this semi-quantitative test should not be interpreted as an indication or degree of immunity or protection from reinfection. . A test result that is 0.8 or more (Positive) means antibodies to SARS-CoV-2 were detected in the blood sample by the test. This could mean that the individual may have an immune response to a recent or prior infection with SARS-CoV-2. Positive results may occur after COVID-19 vaccination, but the clinical significance of a positive antibody result for individuals that have received a COVID-19 vaccine is unknown, and the performance of the test has not been established in COVID-19 vaccinees.  False positive results for the test may occur due to cross-reactivity from pre-existing antibodies or other possible causes. . . A  test result that is less than 0.8 (Negative) means that antibodies were not detected in the blood sample by the test. This could mean that the individual has not been  previously infected with SARS-CoV-2. The clinical significance of a negative antibody result for individuals that have received a COVID-19 vaccine is unknown. The performance of the test has not been established in COVID-19 vaccinees. False negative results for the test may occur if the individual's antibodies have not reached a sufficient level for the test to be able to detect them.  Antibodies can take up to two to three weeks (sometimes longer) to develop  after someone is infected.  How long antibodies to SARS-CoV-2 last after infection is not known. . This test should not be used to diagnose an active SARS-CoV-2 infection. If an active infection is suspected, direct molecular or antigen testing for SARS -CoV-2 is recommended. Please review the "Fact Sheets" available for healthcare providers and  patients using the following websites: https://www.[email or web address]/home/Covid-19/HCP/ antibody/fact-sheet7 https://www.[facility].com/home/Covid-19/ Patients/antibody/fact-sheet7 . Healthcare Providers: For additional information please refer to http://education.[facility].com/ faq/FAQ219(This link is being provided for informational/educational purposes only.) . This test has been authorized by the FDA under an Emergency Use Authorization(EUA)for use by authorized laboratories. The FDA authorized labeling is available on the [employer] website: www.[facility]. com/Covid19. .   Novel Coronavirus, NAA ([facility])     Status: None   Collection Time: 02/02/20  6:29 PM   Specimen: Nasopharyngeal(NP) swabs in vial transport medium   Nasopharynge  Screenin  Result Value Ref Range   SARS-CoV-2, NAA Not Detected Not Detected    Comment: This nucleic acid amplification test was developed and its performance characteristics determined by [employer], [employer] and [employer]. Nucleic acid amplification tests include RT-PCR and TMA. This test has not been FDA cleared or  approved. This test has been authorized by FDA under an Emergency Use Authorization (EUA). This test is only authorized for the duration of time the declaration that circumstances exist justifying the authorization of the emergency use of in vitro diagnostic tests for detection of SARS-CoV-2 virus and/or diagnosis of COVID-19 infection under section 564(b)(1) of the Act, 21 U.S.C. [id number]) (1), unless the authorization is terminated or revoked sooner. When diagnostic testing is negative, the possibility of a false negative result should be considered in the context of a patient's recent exposures and the presence of clinical signs and symptoms consistent with COVID-19. An individual without symptoms of COVID-19 and who is not shedding SARS-CoV-2 virus wo uld expect to have a negative (not detected) result in this assay.   SARS-COV-2, NAA 2 DAY TAT     Status: None   Collection Time: 02/02/20  6:29 PM   Nasopharynge  Screenin  Result Value Ref Range   SARS-CoV-2, NAA 2 DAY TAT Performed     -------------------------------------------------------------------------- A&P:  Problem List Items Addressed This Visit   None   Visit Diagnoses    BPH with obstruction/lower urinary tract symptoms    -  Primary   Relevant Medications   tamsulosin (FLOMAX) 0.4 MG CAPS capsule   Acute non-recurrent maxillary sinusitis       Relevant Medications   amoxicillin-clavulanate (AUGMENTIN) 875-125 MG tablet     #BPH, LUTS - suspected Classic BPH symptoms reported, with urinary frequency, poor emptying bladder, nocturia PSA trend normal. No prior treatment for BPH in past. Will order Tamsulosin alpha blocker trial 0.4mg  daily, caution against orthostatic hypotension  #Acute sinusitis Clinically with constellation of sinus symptoms, worsening and possible inner ear component with off balance Negative covid test 1 week ago, repeat covid test is pending already  Trial on Augmentin  antibiotic Start nasal steroid Flonase 2 sprays in each nostril daily for 4-6 weeks, may repeat course seasonally or as needed  Use OTC cough medicine Follow-up  Meds ordered this encounter  Medications  . tamsulosin (FLOMAX) 0.4 MG CAPS capsule    Sig: Take 1 capsule (0.4 mg total) by mouth daily.    Dispense:  30 capsule    Refill:  3  . amoxicillin-clavulanate (AUGMENTIN) 875-125 MG tablet    Sig: Take 1 tablet by mouth 2 (two) times daily. For  10 days    Dispense:  20 tablet    Refill:  0    Follow-up: - Return in 1-2 weeks as needed  Patient verbalizes understanding with the above medical recommendations including the limitation of remote medical advice.  Specific follow-up and call-back criteria were given for patient to follow-up or seek medical care more urgently if needed.   - Time spent in direct consultation with patient on phone: 9 minutes  [name], [facility] 02/09/2020, 8:53 AM

## 2020-02-09 NOTE — Telephone Encounter (Signed)
  Chronic Care Management   Outreach Note  02/09/2020 Name: Kevin Perry MRN: [id number] DOB: 12/14/1950  Referred by: Smitty Cords, DO Reason for referral : No chief complaint on file.   Was unable to reach patient via telephone today and have left HIPAA compliant voicemail asking patient to return my call.    Follow Up Plan: CM Pharmacist will reach out to patient for telephone call as scheduled by Care Guide for 02/25/2020 at 9:00 AM  Duanne Moron, PharmD, [facility] Clinical Pharmacist [facility] Care Management [phone]

## 2020-02-10 ENCOUNTER — Encounter: Payer: PPO | Admitting: Family Medicine

## 2020-02-10 ENCOUNTER — Encounter: Payer: Self-pay | Admitting: Family Medicine

## 2020-02-10 DIAGNOSIS — I480 Paroxysmal atrial fibrillation: Secondary | ICD-10-CM | POA: Insufficient documentation

## 2020-02-10 LAB — SPECIMEN STATUS REPORT

## 2020-02-10 LAB — SARS-COV-2, NAA 2 DAY TAT

## 2020-02-10 LAB — NOVEL CORONAVIRUS, NAA: SARS-CoV-2, NAA: DETECTED — AB

## 2020-02-24 ENCOUNTER — Other Ambulatory Visit: Payer: PPO

## 2020-02-25 ENCOUNTER — Telehealth: Payer: PPO

## 2020-02-25 ENCOUNTER — Telehealth: Payer: Self-pay | Admitting: Pharmacist

## 2020-02-25 NOTE — Telephone Encounter (Signed)
  Chronic Care Management   Outreach Note  02/25/2020 Name: Kevin Perry MRN: [id number] DOB: [birth year]  Referred by: [name], DO Reason for referral : No chief complaint on file.  Was unable to reach patient via telephone today and have left HIPAA compliant voicemail asking patient to return my call.   Follow Up Plan: Will collaborate with Care Guide to outreach to schedule follow up with me  Harlow Asa, PharmD, [place] [facility] [phone]

## 2020-02-28 ENCOUNTER — Other Ambulatory Visit: Payer: Self-pay | Admitting: Family Medicine

## 2020-02-28 DIAGNOSIS — I10 Essential (primary) hypertension: Secondary | ICD-10-CM

## 2020-02-28 DIAGNOSIS — F32A Depression, unspecified: Secondary | ICD-10-CM

## 2020-02-29 ENCOUNTER — Other Ambulatory Visit: Payer: Self-pay | Admitting: Family Medicine

## 2020-02-29 ENCOUNTER — Other Ambulatory Visit: Payer: Self-pay

## 2020-02-29 DIAGNOSIS — I10 Essential (primary) hypertension: Secondary | ICD-10-CM

## 2020-02-29 MED ORDER — ATORVASTATIN CALCIUM 40 MG PO TABS: 40.0000 mg | ORAL_TABLET | Freq: Every day | ORAL | 3 refills | Status: DC

## 2020-02-29 NOTE — Progress Notes (Signed)
Refill sent to [facility]

## 2020-03-01 DIAGNOSIS — Z951 Presence of aortocoronary bypass graft: Secondary | ICD-10-CM | POA: Diagnosis not present

## 2020-03-01 DIAGNOSIS — I10 Essential (primary) hypertension: Secondary | ICD-10-CM | POA: Diagnosis not present

## 2020-03-01 DIAGNOSIS — I2510 Atherosclerotic heart disease of native coronary artery without angina pectoris: Secondary | ICD-10-CM | POA: Diagnosis not present

## 2020-03-01 DIAGNOSIS — E118 Type 2 diabetes mellitus with unspecified complications: Secondary | ICD-10-CM | POA: Diagnosis not present

## 2020-03-01 DIAGNOSIS — E669 Obesity, unspecified: Secondary | ICD-10-CM | POA: Diagnosis not present

## 2020-03-01 DIAGNOSIS — E7849 Other hyperlipidemia: Secondary | ICD-10-CM | POA: Diagnosis not present

## 2020-03-01 DIAGNOSIS — I252 Old myocardial infarction: Secondary | ICD-10-CM | POA: Diagnosis not present

## 2020-03-01 DIAGNOSIS — I480 Paroxysmal atrial fibrillation: Secondary | ICD-10-CM | POA: Diagnosis not present

## 2020-03-02 ENCOUNTER — Other Ambulatory Visit: Payer: Self-pay

## 2020-03-02 ENCOUNTER — Ambulatory Visit (INDEPENDENT_AMBULATORY_CARE_PROVIDER_SITE_OTHER): Payer: PPO | Admitting: Family Medicine

## 2020-03-02 ENCOUNTER — Encounter: Payer: Self-pay | Admitting: Family Medicine

## 2020-03-02 VITALS — BP 148/76 | HR 61 | Ht 75.0 in | Wt 268.0 lb

## 2020-03-02 DIAGNOSIS — E669 Obesity, unspecified: Secondary | ICD-10-CM | POA: Diagnosis not present

## 2020-03-02 DIAGNOSIS — F325 Major depressive disorder, single episode, in full remission: Secondary | ICD-10-CM

## 2020-03-02 DIAGNOSIS — I872 Venous insufficiency (chronic) (peripheral): Secondary | ICD-10-CM

## 2020-03-02 DIAGNOSIS — I480 Paroxysmal atrial fibrillation: Secondary | ICD-10-CM | POA: Diagnosis not present

## 2020-03-02 DIAGNOSIS — F32A Depression, unspecified: Secondary | ICD-10-CM

## 2020-03-02 DIAGNOSIS — I2510 Atherosclerotic heart disease of native coronary artery without angina pectoris: Secondary | ICD-10-CM

## 2020-03-02 DIAGNOSIS — I10 Essential (primary) hypertension: Secondary | ICD-10-CM | POA: Diagnosis not present

## 2020-03-02 DIAGNOSIS — E113393 Type 2 diabetes mellitus with moderate nonproliferative diabetic retinopathy without macular edema, bilateral: Secondary | ICD-10-CM

## 2020-03-02 DIAGNOSIS — E785 Hyperlipidemia, unspecified: Secondary | ICD-10-CM

## 2020-03-02 DIAGNOSIS — N401 Enlarged prostate with lower urinary tract symptoms: Secondary | ICD-10-CM | POA: Diagnosis not present

## 2020-03-02 DIAGNOSIS — N138 Other obstructive and reflux uropathy: Secondary | ICD-10-CM

## 2020-03-02 DIAGNOSIS — I252 Old myocardial infarction: Secondary | ICD-10-CM | POA: Diagnosis not present

## 2020-03-02 DIAGNOSIS — Z0000 Encounter for general adult medical examination without abnormal findings: Secondary | ICD-10-CM

## 2020-03-02 DIAGNOSIS — Z951 Presence of aortocoronary bypass graft: Secondary | ICD-10-CM

## 2020-03-02 DIAGNOSIS — E1169 Type 2 diabetes mellitus with other specified complication: Secondary | ICD-10-CM

## 2020-03-02 MED ORDER — GLIPIZIDE 10 MG PO TABS: 10.0000 mg | ORAL_TABLET | Freq: Every day | ORAL | 3 refills | Status: DC

## 2020-03-02 MED ORDER — ATORVASTATIN CALCIUM 40 MG PO TABS: 40.0000 mg | ORAL_TABLET | Freq: Every day | ORAL | 3 refills | Status: DC

## 2020-03-02 MED ORDER — AMLODIPINE BESYLATE 10 MG PO TABS: 10.0000 mg | ORAL_TABLET | Freq: Every day | ORAL | 3 refills | Status: DC

## 2020-03-02 MED ORDER — TAMSULOSIN HCL 0.4 MG PO CAPS: 0.4000 mg | ORAL_CAPSULE | Freq: Every day | ORAL | 3 refills | Status: DC

## 2020-03-02 MED ORDER — METFORMIN HCL 500 MG PO TABS: ORAL_TABLET | ORAL | 3 refills | Status: DC

## 2020-03-02 MED ORDER — LISINOPRIL 20 MG PO TABS: 20.0000 mg | ORAL_TABLET | Freq: Every evening | ORAL | 3 refills | Status: DC

## 2020-03-02 MED ORDER — FUROSEMIDE 40 MG PO TABS: 40.0000 mg | ORAL_TABLET | Freq: Every day | ORAL | 3 refills | Status: DC | PRN

## 2020-03-02 MED ORDER — ESCITALOPRAM OXALATE 20 MG PO TABS: 20.0000 mg | ORAL_TABLET | Freq: Every day | ORAL | 3 refills | Status: DC

## 2020-03-02 MED ORDER — AMLODIPINE BESYLATE 5 MG PO TABS: 5.0000 mg | ORAL_TABLET | Freq: Every day | ORAL | 3 refills | Status: DC

## 2020-03-02 NOTE — Assessment & Plan Note (Signed)
Stable, improved On Diuretic Cardiology has already - reduced Amlodipine from 10 to 5

## 2020-03-02 NOTE — Assessment & Plan Note (Signed)
History of PAF °Followed by Cardiology Dr Fath, has had negative holter °Taken off anticoag / amio °

## 2020-03-02 NOTE — Patient Instructions (Addendum)
Thank you for coming to the office today.  Recommend repeat Diabetic Eye Exam - Dr Ellin Mayhew here in [place] when you are ready in 2022  Refilled all meds 90 day 3 refills  Discontinued crestor, refilled atorvastatin lipitor  Keep track of sugar and BP  Blood work today. Stay tuned for results.  Please schedule a Follow-up Appointment to: Return in about 6 months (around 08/30/2020) for 6 month DM A1c.  If you have any other questions or concerns, please feel free to call the office or send a message through [place]. You may also schedule an earlier appointment if necessary.  Additionally, you may be receiving a survey about your experience at our office within a few days to 1 week by e-mail or mail. We value your feedback.  [name], DO [place]

## 2020-03-02 NOTE — Progress Notes (Signed)
Subjective:    Patient ID: Kevin Perry, male    DOB: [birth year], 70 y.o.   MRN: [id number]  Kevin Perry is a 70 y.o. male presenting on 03/02/2020 for Annual Exam   HPI   Here for Annual Physical and Lab Review.   CHRONIC DM, Type 2with Hyperglycemia: Meds: Trulicity1.79m weekly, Glipizide 152mdaily, Metformin 2AM and 1 PM Reports good compliance. Tolerating well w/o side-effects Currently on ACEi Lifestyle: - Diet (trying to improve diet reduce carb intake) - Exercise (walkregularly) - [facility] / 12/2018 - he needs apt with WoEllin Mayhewnytime. Denies hypoglycemia, polyuria, visual changes, numbness or tingling.  CHRONIC HTN: Reportsno concerns, outside BP readings are normal. Recently changed rx Current Meds -Amlodipine 85m69maily (now), Furosemide 5m76miliy, Lisinopril 20mg90mly, Metoprolol XL 50mg 32my Reports good compliance, took meds today. Tolerating well, w/o complaints. Denies CP, dyspnea, HA, edema, dizziness / lightheadedness  HYPERLIPIDEMIA/ Obesity BMI >31 - Reports no concerns. Last lipid panel1/2021, controlledwith mild low HDL, elevated TG - Currently takingRosuvastatin 10mg, 26mrating well without side effects or myalgias  Major Depression, in remission He denies any active depression symptoms at this time. Doing well on Escitalopram.   Health Maintenance: UTD Cologuard negative 11/27/18 UTD Vaccines TDap, PNA series, Flu Negative Hep C 2017  Cardiology updates Last visit 03/01/20 - Dr Fath - Ubaldo Glassingsted some of his medications, after review of 7 day holter monitor, no afib, mostly sinus normal. Had Postoperative TEE 11/2019. - Med changes as of yesterday - reduced Amlodipine from 10 to 85mg, an185mC'd Amiodarone and Eliquis.    Urinary Frequency / BPH LUTS Reports a few weeks with lower urinary symptoms urinary frequency, reduced urine output, and no prior history  of  BPH. Has had normal PSA Admits nocturia Improved on tamsulosin now Denies blood in urine, dysuria burning, urgency.   Depression screen PHQ 2/9 [facility]7/2022 08/10/2019 02/16/2019  Decreased Interest 0 0 0  Down, Depressed, Hopeless 0 0 0  PHQ - 2 Score 0 0 0  Altered sleeping 0 0 0  Tired, decreased energy 0 0 0  Change in appetite 0 0 0  Feeling bad or failure about yourself  0 0 0  Trouble concentrating 0 0 0  Moving slowly or fidgety/restless 0 0 0  Suicidal thoughts 0 0 0  PHQ-9 Score 0 0 0  Difficult doing work/chores Not difficult at all Not difficult at all Not difficult at all  Some recent data might be hidden    Past Medical History:  Diagnosis Date  . Anxiety   . Arthritis   . Coronary artery disease   . Myocardial infarction (HCC)   [facility]  Procedure Laterality Date  . CHOLECYSTECTOMY    . CORONARY ANGIOPLASTY WITH STENT PLACEMENT N/A 2008  . FOOT SURGERY Left    DOS 8.1.14 HALLIX IPJ FUSION, SECOND MET OSTEOTOMY W/SCREW, HAM TOE REPAIR 2,,4 , PARTIAL AMP 3RD DIGIT   . GALLBLADDER SURGERY    . KNEE SURERY Right   . LEFT HEART CATH AND CORONARY ANGIOGRAPHY Left 11/17/2019   Procedure: LEFT HEART CATH AND CORONARY ANGIOGRAPHY;  Surgeon: Fath, KeTeodoro Sprayocation: [facility] IN[facility]adison  Service: Cardiovascular;  Laterality: Left;  . TOTAL KNEE ARTHROPLASTY Right 08/30/2014   Procedure: TOTAL KNEE ARTHROPLASTY;  Surgeon: Michael Hessie Knowsocation: [facility] ORS;  Service: Orthopedics;  Laterality: Right;   Social History   Socioeconomic History  .  Marital status: Married    Spouse name: Not on file  . Number of children: Not on file  . Years of education: Not on file  . Highest education level: Not on file  Occupational History  . Not on file  Tobacco Use  . Smoking status: Former Smoker    Quit date: 08/16/2004    Years since quitting: 15.5  . Smokeless tobacco: Former Systems developer  . Tobacco comment: quit 20 plus years  Vaping Use  . Vaping Use:  Never used  Substance and Sexual Activity  . Alcohol use: Yes    Alcohol/week: 2.0 standard drinks    Types: 2 Cans of beer per week    Comment: SOCIAL  . Drug use: No  . Sexual activity: Not on file  Other Topics Concern  . Not on file  Social History Narrative  . Not on file   Social Determinants of Health   Financial Resource Strain: Not on file  Food Insecurity: Not on file  Transportation Needs: Not on file  Physical Activity: Not on file  Stress: Not on file  Social Connections: Not on file  Intimate Partner Violence: Not on file   History reviewed. No pertinent family history. Current Outpatient Medications on File Prior to Visit  Medication Sig  . acetaminophen (TYLENOL) 325 MG tablet Take by mouth.  Marland Kitchen aspirin 81 MG chewable tablet Chew by mouth.  . fluticasone (FLONASE) 50 MCG/ACT nasal spray Place 2 sprays into both nostrils daily. Use for 4-6 weeks then stop and use seasonally or as needed. (Patient taking differently: Place 2 sprays into both nostrils daily as needed for allergies.)  . folic acid (FOLVITE) 1 MG tablet Take 1 mg by mouth See admin instructions. Take 1 mg daily except skip dose on Thursdays (methotrexate day)  . Insulin Pen Needle 31G X 5 MM MISC 1 each by Does not apply route daily.  Marland Kitchen loratadine (CLARITIN) 10 MG tablet Take 10 mg by mouth every morning.  . methotrexate (RHEUMATREX) 2.5 MG tablet Take 7.5 mg by mouth every Thursday.  . metoprolol tartrate (LOPRESSOR) 25 MG tablet Take by mouth.  . nitroGLYCERIN (NITROSTAT) 0.4 MG SL tablet Place under the tongue.  . polyethylene glycol powder (GLYCOLAX/MIRALAX) 17 GM/SCOOP powder Take by mouth.  . TRULICITY 1.5 EH/2.1YY SOPN INJECT 1.5MG INTO SKIN ONCE WEEKLY (Patient taking differently: Inject 1.5 mg as directed every Wednesday.)   No current facility-administered medications on file prior to visit.    Review of Systems  Constitutional: Negative for activity change, appetite change, chills,  diaphoresis, fatigue and fever.  HENT: Negative for congestion and hearing loss.   Eyes: Negative for visual disturbance.  Respiratory: Negative for cough, chest tightness, shortness of breath and wheezing.   Cardiovascular: Negative for chest pain, palpitations and leg swelling.  Gastrointestinal: Negative for abdominal pain, constipation, diarrhea, nausea and vomiting.  Genitourinary: Negative for dysuria, frequency and hematuria.  Musculoskeletal: Negative for arthralgias and neck pain.  Skin: Negative for rash.  Allergic/Immunologic: Negative for environmental allergies.  Neurological: Negative for dizziness, weakness, light-headedness, numbness and headaches.  Hematological: Negative for adenopathy.  Psychiatric/Behavioral: Negative for behavioral problems, dysphoric mood and sleep disturbance.   Per HPI unless specifically indicated above     Objective:    BP (!) 148/76   Pulse 61   Ht '6\' 3"'  (1.905 m)   Wt 268 lb (121.6 kg)   SpO2 99%   BMI 33.50 kg/m   Wt Readings from Last 3 Encounters:  03/02/20  268 lb (121.6 kg)  12/08/19 262 lb 9.6 oz (119.1 kg)  11/17/19 260 lb (117.9 kg)    Physical Exam Vitals and nursing note reviewed.  Constitutional:      General: He is not in acute distress.    Appearance: He is well-developed and well-nourished. He is not diaphoretic.     Comments: Well-appearing, comfortable, cooperative  HENT:     Head: Normocephalic and atraumatic.     Mouth/Throat:     Mouth: Oropharynx is clear and moist.  Eyes:     General:        Right eye: No discharge.        Left eye: No discharge.     Extraocular Movements: EOM normal.     Conjunctiva/sclera: Conjunctivae normal.     Pupils: Pupils are equal, round, and reactive to light.  Neck:     Thyroid: No thyromegaly.     Vascular: No carotid bruit.  Cardiovascular:     Rate and Rhythm: Normal rate and regular rhythm.     Pulses: Intact distal pulses.     Heart sounds: Normal heart sounds. No  murmur heard.   Pulmonary:     Effort: Pulmonary effort is normal. No respiratory distress.     Breath sounds: Normal breath sounds. No wheezing or rales.  Abdominal:     General: Bowel sounds are normal. There is no distension.     Palpations: Abdomen is soft. There is no mass.     Tenderness: There is no abdominal tenderness.  Musculoskeletal:        General: No tenderness or edema. Normal range of motion.     Cervical back: Normal range of motion and neck supple.     Right lower leg: No edema.     Left lower leg: No edema.     Comments: Upper / Lower Extremities: - Normal muscle tone, strength bilateral upper extremities 5/5, lower extremities 5/5  Lymphadenopathy:     Cervical: No cervical adenopathy.  Skin:    General: Skin is warm and dry.     Findings: No erythema or rash.  Neurological:     Mental Status: He is alert and oriented to person, place, and time.     Comments: Distal sensation intact to light touch all extremities  Psychiatric:        Mood and Affect: Mood and affect normal.        Behavior: Behavior normal.     Comments: Well groomed, good eye contact, normal speech and thoughts      Recent Labs    08/10/19 0932  HGBA1C 7.2*     Results for orders placed or performed in visit on 02/08/20  Novel Coronavirus, NAA ([facility])   Specimen: Nasopharyngeal(NP) swabs in vial transport medium   Nasopharynge  Result Value Ref Range   SARS-CoV-2, NAA Detected (A) Not Detected  SARS-COV-2, NAA 2 DAY TAT   Nasopharynge  Result Value Ref Range   SARS-CoV-2, NAA 2 DAY TAT Performed   Specimen status report  Result Value Ref Range   specimen status report Comment       Assessment & Plan:   Problem List Items Addressed This Visit    Type 2 diabetes mellitus with other specified complication ([place])    Previously improved, check V6P again Complicated by Hyperglycemia, Hyperlipidemia, Mood, Cataract (s/p surgery), DM Retinopathy - increase risk of  cardiovascular abnormality w/ known CAD - Prior Meds: Onglyza (ins/cost), Metformin XR 750 (GI intolerance)  Plan:  1. Continue increased Metformin IR 565m x 2 in AM / x 1 in PM - CONTINUE current meds - Trulicity 12.0EBweekly [place] inj, GLipizide 170mdaily - Counseling on risk hypoglycemia on sulfonylurea - We discussed option in future of increase Trulicity to 3.3.4DHeekly and reduce or Stop Glipizide - may do this in future if indicated. His Trulicity is from PAP from manufacturer would need to submit change of dose form.  2. Encourage improved lifestyle - low carb, low sugar diet, reduce portion size, goal for regular balanced 3 x daily meal instead of skipping lunch, continue improving regular exercise 3. Check CBG, bring log to next visit for review - check 3x weekly 4. Continue ASA, ACEi, Statin      Relevant Medications   atorvastatin (LIPITOR) 40 MG tablet   glipiZIDE (GLUCOTROL) 10 MG tablet   lisinopril (ZESTRIL) 20 MG tablet   metFORMIN (GLUCOPHAGE) 500 MG tablet   S/P CABG x 4   Paroxysmal atrial fibrillation (HCC)    History of PAF Followed by Cardiology Dr FaUbaldo Glassinghas had negative holter Taken off anticoag / amio      Relevant Medications   atorvastatin (LIPITOR) 40 MG tablet   furosemide (LASIX) 40 MG tablet   lisinopril (ZESTRIL) 20 MG tablet   amLODipine (NORVASC) 5 MG tablet   Other Relevant Orders   CBC with Differential/Platelet   COMPLETE METABOLIC PANEL WITH GFR   Obesity (BMI 30.0-34.9)   Relevant Orders   COMPLETE METABOLIC PANEL WITH GFR   Moderate non-proliferative diabetic retinopathy (HCCuster   Should f/u with Dr WoEllin Mayhewn 2022      Relevant Medications   atorvastatin (LIPITOR) 40 MG tablet   glipiZIDE (GLUCOTROL) 10 MG tablet   lisinopril (ZESTRIL) 20 MG tablet   metFORMIN (GLUCOPHAGE) 500 MG tablet   Other Relevant Orders   Hemoglobin A1c   Major depression in remission (HCC)    Stable, controlled in remission on SSRI Continue current  therapy      Relevant Medications   escitalopram (LEXAPRO) 20 MG tablet   glipiZIDE (GLUCOTROL) 10 MG tablet   Other Relevant Orders   COMPLETE METABOLIC PANEL WITH GFR   Hyperlipidemia associated with type 2 diabetes mellitus [place]   Well controlled cholesterol on statin and lifestyle - now has some high TG still Check non fasting lipid today Calculated ASCVD 10 yr risk score elevated  Plan: 1. Continue current meds - Rosuvastatin 10102maily 2. Continue ASA 19m17mr primary ASCVD risk reduction 3. Encourage improved lifestyle - low carb/cholesterol, reduce portion size, continue improving regular exercise      Relevant Medications   atorvastatin (LIPITOR) 40 MG tablet   glipiZIDE (GLUCOTROL) 10 MG tablet   lisinopril (ZESTRIL) 20 MG tablet   metFORMIN (GLUCOPHAGE) 500 MG tablet   Other Relevant Orders   Lipid panel   History of MI (myocardial infarction)   Essential hypertension    BP controlled - Home BP readings normal previously History of MI / CAD. Peripheral edema   Plan:  1. Continue current BP regimen - Amlodipine 5mg 21mly (Switched dose to 5mg, 78mdiology lowered from 10, new rx sent - corrected the original rx I sent, I called tarheel), Metoprolol 25 BID, Furosemide 40mg d65m 2. Encourage improved lifestyle - low sodium diet, regular exercise 3. Continue monitor BP outside office, bring readings to next visit, if persistently >140/90 or new symptoms notify office sooner       Relevant Medications   atorvastatin (LIPITOR)  40 MG tablet   furosemide (LASIX) 40 MG tablet   lisinopril (ZESTRIL) 20 MG tablet   amLODipine (NORVASC) 5 MG tablet   Other Relevant Orders   CBC with Differential/Platelet   COMPLETE METABOLIC PANEL WITH GFR   Edema of both lower extremities due to peripheral venous insufficiency    Stable, improved On Diuretic Cardiology has already - reduced Amlodipine from 10 to 5      Relevant Medications   atorvastatin (LIPITOR) 40 MG tablet    furosemide (LASIX) 40 MG tablet   lisinopril (ZESTRIL) 20 MG tablet   amLODipine (NORVASC) 5 MG tablet   Coronary artery disease involving native coronary artery of native heart without angina pectoris    Stable. Without angina. S/p CABG - Followed by Cardiology, Dr Ubaldo Glassing  Plan 1. Continue current med management      Relevant Medications   atorvastatin (LIPITOR) 40 MG tablet   furosemide (LASIX) 40 MG tablet   lisinopril (ZESTRIL) 20 MG tablet   amLODipine (NORVASC) 5 MG tablet    Other Visit Diagnoses    Annual physical exam    -  Primary   Relevant Orders   Hemoglobin A1c   CBC with Differential/Platelet   COMPLETE METABOLIC PANEL WITH GFR   Lipid panel   BPH with obstruction/lower urinary tract symptoms       Relevant Medications   tamsulosin (FLOMAX) 0.4 MG CAPS capsule   Other Relevant Orders   PSA   Depression, unspecified depression type       Relevant Medications   escitalopram (LEXAPRO) 20 MG tablet   glipiZIDE (GLUCOTROL) 10 MG tablet       Updated Health Maintenance information - Needs COVID Booster, DM Eye Exam Reviewed recent lab results with patient Encouraged improvement to lifestyle with diet and exercise - Goal of weight loss   Meds ordered this encounter  Medications  . DISCONTD: amLODipine (NORVASC) 10 MG tablet    Sig: Take 1 tablet (10 mg total) by mouth daily.    Dispense:  90 tablet    Refill:  3    Add refills on file  . atorvastatin (LIPITOR) 40 MG tablet    Sig: Take 1 tablet (40 mg total) by mouth daily.    Dispense:  90 tablet    Refill:  3    Update, should be 90 pills not 60  . escitalopram (LEXAPRO) 20 MG tablet    Sig: Take 1 tablet (20 mg total) by mouth at bedtime.    Dispense:  90 tablet    Refill:  3    Add refills on file  . furosemide (LASIX) 40 MG tablet    Sig: Take 1 tablet (40 mg total) by mouth daily as needed.    Dispense:  90 tablet    Refill:  3  . glipiZIDE (GLUCOTROL) 10 MG tablet    Sig: Take 1 tablet  (10 mg total) by mouth daily before breakfast.    Dispense:  90 tablet    Refill:  3    Add refills on file  . lisinopril (ZESTRIL) 20 MG tablet    Sig: Take 1 tablet (20 mg total) by mouth every evening.    Dispense:  90 tablet    Refill:  3  . metFORMIN (GLUCOPHAGE) 500 MG tablet    Sig: Take 2 in morning with breakfast and take 1 tab in evening with dinner    Dispense:  270 tablet    Refill:  3  .  tamsulosin (FLOMAX) 0.4 MG CAPS capsule    Sig: Take 1 capsule (0.4 mg total) by mouth daily.    Dispense:  90 capsule    Refill:  3    Add refills on file  . amLODipine (NORVASC) 5 MG tablet    Sig: Take 1 tablet (5 mg total) by mouth daily.    Dispense:  90 tablet    Refill:  3    Dose reduction from 10 to 5. Please do not fill 10, if you can - fill the 5 when patient is ready.      Follow up plan: Return in about 6 months (around 08/30/2020) for 6 month DM A1c.  [name], [facility] 03/02/20, 11:11 AM

## 2020-03-02 NOTE — Assessment & Plan Note (Signed)
Well controlled cholesterol on statin and lifestyle - now has some high TG still Check non fasting lipid today Calculated ASCVD 10 yr risk score elevated  Plan: 1. Continue current meds - Rosuvastatin 10mg  daily 2. Continue ASA 81mg  for primary ASCVD risk reduction 3. Encourage improved lifestyle - low carb/cholesterol, reduce portion size, continue improving regular exercise

## 2020-03-02 NOTE — Assessment & Plan Note (Signed)
Stable. Without angina. S/p CABG - Followed by Cardiology, Dr Ubaldo Glassing  Plan 1. Continue current med management

## 2020-03-02 NOTE — Assessment & Plan Note (Signed)
Previously improved, check J6G again Complicated by Hyperglycemia, Hyperlipidemia, Mood, Cataract (s/p surgery), DM Retinopathy - increase risk of cardiovascular abnormality w/ known CAD - Prior Meds: Onglyza (ins/cost), Metformin XR 750 (GI intolerance)  Plan:  1. Continue increased Metformin IR 500mg  x 2 in AM / x 1 in PM - CONTINUE current meds - Trulicity 1.5mg  weekly [place] inj, GLipizide 10mg  daily - Counseling on risk hypoglycemia on sulfonylurea - We discussed option in future of increase Trulicity to 3.0mg  weekly and reduce or Stop Glipizide - may do this in future if indicated. His Trulicity is from PAP from manufacturer would need to submit change of dose form.  2. Encourage improved lifestyle - low carb, low sugar diet, reduce portion size, goal for regular balanced 3 x daily meal instead of skipping lunch, continue improving regular exercise 3. Check CBG, bring log to next visit for review - check 3x weekly 4. Continue ASA, ACEi, Statin

## 2020-03-02 NOTE — Assessment & Plan Note (Signed)
Stable, controlled in remission on SSRI Continue current therapy

## 2020-03-02 NOTE — Assessment & Plan Note (Signed)
BP controlled - Home BP readings normal previously History of MI / CAD. Peripheral edema   Plan:  1. Continue current BP regimen - Amlodipine 5mg  daily (Switched dose to 5mg , cardiology lowered from 10, new rx sent - corrected the original rx I sent, I called tarheel), Metoprolol 25 BID, Furosemide 40mg  daily 2. Encourage improved lifestyle - low sodium diet, regular exercise 3. Continue monitor BP outside office, bring readings to next visit, if persistently >140/90 or new symptoms notify office sooner

## 2020-03-02 NOTE — Assessment & Plan Note (Signed)
Should f/u with Dr Ellin Mayhew in 2022

## 2020-03-03 LAB — CBC WITH DIFFERENTIAL/PLATELET
Absolute Monocytes: 364 cells/uL (ref 200–950)
Basophils Absolute: 28 cells/uL (ref 0–200)
Basophils Relative: 0.5 %
Eosinophils Absolute: 179 cells/uL (ref 15–500)
Eosinophils Relative: 3.2 %
HCT: 44 % (ref 38.5–50.0)
Hemoglobin: 14.8 g/dL (ref 13.2–17.1)
Lymphs Abs: 2016 cells/uL (ref 850–3900)
MCH: 29.8 pg (ref 27.0–33.0)
MCHC: 33.6 g/dL (ref 32.0–36.0)
MCV: 88.7 fL (ref 80.0–100.0)
MPV: 9.9 fL (ref 7.5–12.5)
Monocytes Relative: 6.5 %
Neutro Abs: 3013 cells/uL (ref 1500–7800)
Neutrophils Relative %: 53.8 %
Platelets: 169 10*3/uL (ref 140–400)
RBC: 4.96 10*6/uL (ref 4.20–5.80)
RDW: 14.8 % (ref 11.0–15.0)
Total Lymphocyte: 36 %
WBC: 5.6 10*3/uL (ref 3.8–10.8)

## 2020-03-03 LAB — HEMOGLOBIN A1C
Hgb A1c MFr Bld: 6.2 % of total Hgb — ABNORMAL HIGH (ref <5.7)
Mean Plasma Glucose: 131 mg/dL
eAG (mmol/L): 7.3 mmol/L

## 2020-03-03 LAB — COMPLETE METABOLIC PANEL WITH GFR
AG Ratio: 1.8 (calc) (ref 1.0–2.5)
ALT: 20 U/L (ref 9–46)
AST: 18 U/L (ref 10–35)
Albumin: 4.2 g/dL (ref 3.6–5.1)
Alkaline phosphatase (APISO): 44 U/L (ref 35–144)
BUN: 9 mg/dL (ref 7–25)
CO2: 30 mmol/L (ref 20–32)
Calcium: 9.7 mg/dL (ref 8.6–10.3)
Chloride: 100 mmol/L (ref 98–110)
Creat: 0.97 mg/dL (ref 0.70–1.25)
GFR, Est African American: 92 mL/min/{1.73_m2} (ref >=60)
GFR, Est Non African American: 79 mL/min/{1.73_m2} (ref >=60)
Globulin: 2.4 g/dL (calc) (ref 1.9–3.7)
Glucose, Bld: 120 mg/dL — ABNORMAL HIGH (ref 65–99)
Potassium: 4 mmol/L (ref 3.5–5.3)
Sodium: 139 mmol/L (ref 135–146)
Total Bilirubin: 0.6 mg/dL (ref 0.2–1.2)
Total Protein: 6.6 g/dL (ref 6.1–8.1)

## 2020-03-03 LAB — LIPID PANEL
Cholesterol: 124 mg/dL (ref <200)
HDL: 44 mg/dL (ref >=40)
LDL Cholesterol (Calc): 54 mg/dL (calc)
Non-HDL Cholesterol (Calc): 80 mg/dL (calc) (ref <130)
Total CHOL/HDL Ratio: 2.8 (calc) (ref <5.0)
Triglycerides: 193 mg/dL — ABNORMAL HIGH (ref <150)

## 2020-03-03 LAB — PSA: PSA: 0.4 ng/mL (ref <=4.0)

## 2020-03-06 NOTE — Telephone Encounter (Signed)
Patient has been rescheduled.

## 2020-03-08 DIAGNOSIS — L570 Actinic keratosis: Secondary | ICD-10-CM | POA: Diagnosis not present

## 2020-03-08 DIAGNOSIS — Z85828 Personal history of other malignant neoplasm of skin: Secondary | ICD-10-CM | POA: Diagnosis not present

## 2020-03-08 DIAGNOSIS — L853 Xerosis cutis: Secondary | ICD-10-CM | POA: Diagnosis not present

## 2020-03-08 DIAGNOSIS — D0462 Carcinoma in situ of skin of left upper limb, including shoulder: Secondary | ICD-10-CM | POA: Diagnosis not present

## 2020-03-27 ENCOUNTER — Ambulatory Visit: Payer: PPO | Admitting: Pharmacist

## 2020-03-27 ENCOUNTER — Telehealth: Payer: PPO

## 2020-03-27 DIAGNOSIS — E1169 Type 2 diabetes mellitus with other specified complication: Secondary | ICD-10-CM

## 2020-03-27 NOTE — Chronic Care Management (AMB) (Signed)
Chronic Care Management Pharmacy Note  03/27/2020 Name:  Kevin Perry MRN:  [id number] DOB:  Aug 30, 1950  Subjective: Kevin Perry is an 70 y.o. year old male who is a primary patient of [name], DO.  The CCM team was consulted for assistance with disease management and care coordination needs.    Engaged with patient by telephone for follow up visit in response to provider referral for pharmacy case management and/or care coordination services.   Consent to Services:  The patient was given information about Chronic Care Management services, agreed to services, and gave verbal consent prior to initiation of services.  Please see initial visit note for detailed documentation.   Objective:   Lab Results  Component Value Date   HGBA1C 6.2 (H) 03/02/2020    Assessment: Review of patient past medical history, allergies, medications, health status, including review of consultants reports, laboratory and other test data, was performed as part of comprehensive evaluation and provision of chronic care management services.   SDOH:  (Social Determinants of Health) assessments and interventions performed: none   CCM Care Plan  Allergies  Allergen Reactions  . Naproxen Swelling and Anaphylaxis    Medications Reviewed Today    Reviewed by [name], DO (Physician) on 03/02/20 at 1110  Med List Status: <None>  Medication Order Taking? Sig Documenting Provider Last Dose Status Informant  acetaminophen (TYLENOL) 325 MG tablet [id number] Yes Take by mouth. [provider] Taking Active   amLODipine (NORVASC) 10 MG tablet [id number] Yes TAKE 1 TABLET BY MOUTH ONCE DAILY [name], DO Taking Active   amoxicillin-clavulanate (AUGMENTIN) 875-125 MG tablet [id number] Yes Take 1 tablet by mouth 2 (two) times daily. For 10 days [name], DO Taking Active   aspirin 81 MG chewable tablet [id number] Yes Chew by mouth. [provider] Taking Active   atorvastatin (LIPITOR) 40 MG tablet [id number] Yes Take 1 tablet (40 mg total) by mouth daily. [name], DO Taking Active   escitalopram (LEXAPRO) 20 MG tablet [id number] Yes TAKE 1 TABLET BY MOUTH AT BEDTIME [name], DO Taking Active   fluticasone (FLONASE) 50 MCG/ACT nasal spray [id number] Yes Place 2 sprays into both nostrils daily. Use for 4-6 weeks then stop and use seasonally or as needed.  Patient taking differently: Place 2 sprays into both nostrils daily as needed for allergies.   [name], DO Taking Active   folic acid (FOLVITE) 1 MG tablet [id number] Yes Take 1 mg by mouth See admin instructions. Take 1 mg daily except skip dose on Thursdays (methotrexate day) [provider] Taking Active Self  furosemide (LASIX) 40 MG tablet [id number] Yes TAKE 1 TABLET BY MOUTH ONCE DAILY AS NEEDED  Patient taking differently: Take 40 mg by mouth daily.   [name], DO Taking Active   glipiZIDE (GLUCOTROL) 10 MG tablet [id number] Yes TAKE 1 TABLET BY MOUTH ONCE DAILY WITH BREAKFAST Karamalegos, [name], DO Taking Active   Insulin Pen Needle 31G X 5 MM MISC [id number] Yes 1 each by Does not apply route daily. Luciana Axe, NP Taking Active Self  lisinopril (ZESTRIL) 20 MG tablet [id number] Yes TAKE 1 TABLET BY MOUTH ONCE EVERY MORNING Karamalegos, [name], DO Taking Active   loratadine (CLARITIN) 10 MG tablet [id number] Yes Take 10 mg by mouth every morning. [provider] Taking Active Self  metFORMIN (GLUCOPHAGE) 500 MG tablet [id number] Yes TAKE 2 TABLETS BY MOUTH ONCE EVERY MORNING  WITH BREAKFAST AND 1 TABLET Henry Russel, [name], DO Taking Active   methotrexate (RHEUMATREX) 2.5 MG tablet [id number] Yes Take 7.5 mg by mouth every Thursday. [provider] Taking Active Self  metoprolol succinate (TOPROL-XL) 50 MG 24 hr tablet [id number] Yes TAKE 1 TABLET BY MOUTH  ONCE DAILY Karamalegos, [name], DO Taking Active Self  metoprolol tartrate (LOPRESSOR) 25 MG tablet [id number] Yes Take by mouth. [provider] Taking Active   nitroGLYCERIN (NITROSTAT) 0.4 MG SL tablet [id number] Yes Place under the tongue. [provider] Taking Active   oxyCODONE (OXY IR/ROXICODONE) 5 MG immediate release tablet [id number] Yes SMARTSIG:1 Tablet(s) By Mouth 4-5 Times Daily [provider] Taking Active   polyethylene glycol powder (GLYCOLAX/MIRALAX) 17 GM/SCOOP powder [id number] Yes Take by mouth. [provider] Taking Active   rosuvastatin (CRESTOR) 10 MG tablet [id number] Yes TAKE 1 TABLET BY MOUTH ONCE EVERY EVENING Karamalegos, [name], DO Taking Active   sodium chloride flush (NS) 0.9 % injection 3 mL [id number]   [name], MD  Active   tamsulosin [facility]) 0.4 MG CAPS capsule [id number] Yes Take 1 capsule (0.4 mg total) by mouth daily. [name], DO Taking Active   TRULICITY 1.5 MO/7.0BE [name] [id number] Yes INJECT 1.5MG  INTO SKIN ONCE WEEKLY  Patient taking differently: Inject 1.5 mg as directed every Wednesday.   [name], DO Taking Active           Patient Active Problem List   Diagnosis Date Noted  . Paroxysmal atrial fibrillation ([place]) 02/10/2020  . S/P CABG x 4 12/08/2019  . Moderate non-proliferative diabetic retinopathy ([place]) 10/08/2019  . History of MI (myocardial infarction) 08/10/2019  . Bilateral osteoarthritis of finger 07/17/2018  . Obesity (BMI 30.0-34.9) 12/31/2017  . Coronary artery disease involving native coronary artery of native heart without angina pectoris 03/09/2015  . Essential hypertension 09/29/2014  . Type 2 diabetes mellitus with other specified complication ([place]) 00/71/2197  . Major depression in remission ([place]) 09/29/2014  . Hyperlipidemia associated with type 2 diabetes mellitus ([place]) 09/29/2014  . Edema of both lower extremities due to peripheral venous  insufficiency 09/29/2014  . Anxiety 09/20/2014  . Primary osteoarthritis of knee 08/30/2014    Conditions to be addressed/monitored: HTN and DMII  Care Plan : PharmD - Med Assistance  Updates made by Vella Raring, [place] since 03/27/2020 12:00 AM    Problem: Disease Progression     Long-Range Goal: Disease Progression Prevented or Minimized   Start Date: 03/27/2020  Expected End Date: 06/25/2020  This Visit's Progress: On track  Priority: High  Note:   Current Barriers:  . Unable to independently afford treatment regimen  Pharmacist Clinical Goal(s):  Marland Kitchen Over the next 90 days, patient will verbalize ability to afford treatment regimen through collaboration with PharmD and provider.   Interventions: . 1:1 collaboration with [name], DO regarding development and update of comprehensive plan of care as evidenced by provider attestation and co-signature . Inter-disciplinary care team collaboration (see longitudinal plan of care) . Schedule appointment with patient to complete comprehensive medication review  Medication Assistance . Patient referred to pharmacy for assistance with completing patient assistance application for Trulicity through [facility] o Patient reports has brought completed application as well as financial documentation to office  o Patient reports currently having ~1 month supply of medication left at home . Will collaborate with [place] to request her aid with patient assistance application for Trulicity . Will collaborate  with office staff to request any documents received by office be faxed to [place]  Patient Goals/Self-Care Activities . Over the next 90 days, patient will:  - collaborate with provider on medication access solutions  Follow Up Plan: Telephone follow up appointment with care management team member scheduled for: 04/07/2020 at 10:45 AM      Follow Up:  Patient agrees to Care Plan and Follow-up.  Harlow Asa, PharmD, [facility] [phone]

## 2020-03-27 NOTE — Patient Instructions (Signed)
Visit Information  PATIENT GOALS:  Work with providers to complete application for patient assistance  Patient verbalizes understanding of instructions provided today and agrees to view in [facility].   Telephone follow up appointment with care management team member scheduled for:  04/07/2020 at 10:45 AM  Harlow Asa, PharmD, [place] [phone]

## 2020-04-07 ENCOUNTER — Ambulatory Visit (INDEPENDENT_AMBULATORY_CARE_PROVIDER_SITE_OTHER): Payer: PPO | Admitting: Pharmacist

## 2020-04-07 DIAGNOSIS — E1169 Type 2 diabetes mellitus with other specified complication: Secondary | ICD-10-CM

## 2020-04-07 DIAGNOSIS — E785 Hyperlipidemia, unspecified: Secondary | ICD-10-CM

## 2020-04-07 DIAGNOSIS — I10 Essential (primary) hypertension: Secondary | ICD-10-CM

## 2020-04-07 NOTE — Patient Instructions (Signed)
Visit Information  PATIENT GOALS: Goals Addressed            This Visit's Progress    Pharmacy Goals       Our goal bad cholesterol, or LDL, is less than 70 . This is why it is important to continue taking your atorvastatin  Please check your home blood pressure, keep a log of the results and bring this with you to your medical appointments.  Feel free to call me with any questions or concerns. I look forward to our next call!  Harlow Asa, PharmD, [place] [phone]       The patient verbalized understanding of instructions, educational materials, and care plan provided today and declined offer to receive copy of patient instructions, educational materials, and care plan.   The care management team will reach out to the patient again over the next 30 days.    Harlow Asa, PharmD, [place] [phone]

## 2020-04-07 NOTE — Chronic Care Management (AMB) (Signed)
Chronic Care Management Pharmacy Note  04/07/2020 Name:  Kevin Perry MRN:  [id number] DOB:  01/09/1951  Subjective: Kevin Perry is an 71 y.o. year old male who is a primary patient of [name], DO.  The CCM team was consulted for assistance with disease management and care coordination needs.    Engaged with patient by telephone for follow up visit in response to provider referral for pharmacy case management and/or care coordination services.   Consent to Services:  The patient was given information about Chronic Care Management services, agreed to services, and gave verbal consent prior to initiation of services.  Please see initial visit note for detailed documentation.   Objective:  Lab Results  Component Value Date   CREATININE 0.97 03/02/2020   CREATININE 0.82 02/11/2019   CREATININE 0.73 01/01/2019    Lab Results  Component Value Date   HGBA1C 6.2 (H) 03/02/2020       Component Value Date/Time   CHOL 124 03/02/2020 1130   CHOL 153 07/27/2012 1335   TRIG 193 (H) 03/02/2020 1130   TRIG 212 (H) 07/27/2012 1335   HDL 44 03/02/2020 1130   HDL 34 (L) 07/27/2012 1335   CHOLHDL 2.8 03/02/2020 1130   VLDL 29 09/19/2015 1335   VLDL 42 (H) 07/27/2012 1335   LDLCALC 54 03/02/2020 1130   LDLCALC 77 07/27/2012 1335    Clinical ASCVD: Yes  The ASCVD Risk score Mikey Bussing DC Jr., et al., 2013) failed to calculate for the following reasons:   The patient has a prior MI or stroke diagnosis     BP Readings from Last 3 Encounters:  03/02/20 (!) 148/76  12/08/19 118/68  11/17/19 (!) 152/75    Assessment: Review of patient past medical history, allergies, medications, health status, including review of consultants reports, laboratory and other test data, was performed as part of comprehensive evaluation and provision of chronic care management services.   SDOH:  (Social Determinants of Health) assessments and interventions performed: yes   CCM Care  Plan  Allergies  Allergen Reactions  . Naproxen Swelling and Anaphylaxis    Medications Reviewed Today    Reviewed by Vella Raring, [facility] (Pharmacist) on 04/07/20 at 1110  Med List Status: <None>  Medication Order Taking? Sig Documenting Provider Last Dose Status Informant  acetaminophen (TYLENOL) 325 MG tablet [id number] Yes Take by mouth. [provider] Taking Active   amLODipine (NORVASC) 5 MG tablet [id number] Yes Take 1 tablet (5 mg total) by mouth daily.  Patient taking differently: Take 10 mg by mouth daily.   [name], DO Taking Active   aspirin 81 MG chewable tablet [id number] Yes Chew by mouth. [provider] Taking Active   atorvastatin (LIPITOR) 40 MG tablet [id number] Yes Take 1 tablet (40 mg total) by mouth daily. [name], DO Taking Active   escitalopram (LEXAPRO) 20 MG tablet [id number] Yes Take 1 tablet (20 mg total) by mouth at bedtime. [name], DO Taking Active   folic acid (FOLVITE) 1 MG tablet [id number] Yes Take 1 mg by mouth See admin instructions. Take 1 mg daily except skip dose on Wednesdays (methotrexate day) [provider] Taking Active Self  furosemide (LASIX) 40 MG tablet [id number] Yes Take 1 tablet (40 mg total) by mouth daily as needed.  Patient taking differently: Take 40 mg by mouth daily.   [name], DO Taking Active   glipiZIDE (GLUCOTROL) 10 MG tablet [id number] Yes Take 1 tablet (10  mg total) by mouth daily before breakfast. [name], DO Taking Active   Insulin Pen Needle 31G X 5 MM MISC [id number]  1 each by Does not apply route daily. Luciana Axe, NP  Active Self  lisinopril (ZESTRIL) 20 MG tablet [id number] Yes Take 1 tablet (20 mg total) by mouth every evening.  Patient taking differently: Take 20 mg by mouth daily.   [name], DO Taking Active   loratadine (CLARITIN) 10 MG tablet [id number] Yes Take 10 mg by mouth every  morning. [provider] Taking Active Self  metFORMIN (GLUCOPHAGE) 500 MG tablet [id number] Yes Take 2 in morning with breakfast and take 1 tab in evening with dinner [name], DO Taking Active   methotrexate (RHEUMATREX) 2.5 MG tablet [id number] Yes Take 7.5 mg by mouth every Wednesday. [provider] Taking Active Self  metoprolol tartrate (LOPRESSOR) 25 MG tablet [id number] Yes Take 25 mg by mouth 2 (two) times daily. [provider] Taking Active   nitroGLYCERIN (NITROSTAT) 0.4 MG SL tablet [id number] Yes Place under the tongue. [provider] Taking Active   tamsulosin (FLOMAX) 0.4 MG CAPS capsule [id number] Yes Take 1 capsule (0.4 mg total) by mouth daily. [name], DO Taking Active   TRULICITY 1.5 IP/3.8SN [name] [id number] Yes INJECT 1.5MG  INTO SKIN ONCE WEEKLY  Patient taking differently: Inject 1.5 mg as directed every Wednesday.   [name], DO Taking Active           Patient Active Problem List   Diagnosis Date Noted  . Paroxysmal atrial fibrillation ([place]) 02/10/2020  . S/P CABG x 4 12/08/2019  . Moderate non-proliferative diabetic retinopathy ([place]) 10/08/2019  . History of MI (myocardial infarction) 08/10/2019  . Bilateral osteoarthritis of finger 07/17/2018  . Obesity (BMI 30.0-34.9) 12/31/2017  . Coronary artery disease involving native coronary artery of native heart without angina pectoris 03/09/2015  . Essential hypertension 09/29/2014  . Type 2 diabetes mellitus with other specified complication ([place]) 19/37/9024  . Major depression in remission ([place]) 09/29/2014  . Hyperlipidemia associated with type 2 diabetes mellitus ([place]) 09/29/2014  . Edema of both lower extremities due to peripheral venous insufficiency 09/29/2014  . Anxiety 09/20/2014  . Primary osteoarthritis of knee 08/30/2014    Conditions to be addressed/monitored: HTN, HLD and DMII  Care Plan : PharmD - Med Assistance   Updates made by Vella Raring, [place] since 04/07/2020 12:00 AM    Problem: Disease Progression     Long-Range Goal: Disease Progression Prevented or Minimized   Start Date: 03/27/2020  Expected End Date: 06/25/2020  Recent Progress: On track  Priority: High  Note:   Current Barriers:  . Unable to independently afford treatment regimen  Pharmacist Clinical Goal(s):  Marland Kitchen Over the next 90 days, patient will verbalize ability to afford treatment regimen through collaboration with PharmD and provider.   Interventions: . 1:1 collaboration with [name], DO regarding development and update of comprehensive plan of care as evidenced by provider attestation and co-signature . Inter-disciplinary care team collaboration (see longitudinal plan of care) . Comprehensive medication review performed; medication list updated in electronic medical record  Type 2 Diabetes: . Controlled; current treatment: o Glipizide 10 mg daily before breakfast o Metformin 500 mg - 2 tablets (1000 mg) in morning with breakfast and 1 tablet (500 mg) daily with dinner o Trulicity 1.5 mg weekly on Wednesdays . Reports recent fasting morning blood sugars ranging: 120-130 . Denies any recent s/s of  hypoglycemia  Hypertension: . Current treatment: o Amlodipine 5 mg daily o Furosemide 40 mg daily o Lisinopril 20 mg daily o Metoprolol tartrate 25 mg BID . Denies recently monitoring home BP . Identify patient has been taking amlodipine 10 mg daily. From review of chart, per Office Visit with [facility] on 1/26, provider advised patient to "reduce amlodipine from 10mg  daily to 5mg  daily due to bilateral lower extremity swelling" and monitor home blood pressure . Patient recalls instruction from Cardiologist and states will reduce amlodipine dose to 5 mg daily as previously directed and restart monitoring home blood pressure   Hyperlipidemia: . Controlled; current treatment: atorvastatin  40 mg daily  Medication Assistance: . Collaborated with office staff to request any documents received by office be faxed to [place] o Application received, but page 2 of application missing. Financial document for patient received, but documentation of all household income needed . Collaborate with [place] for her aid with patient assistance application for Trulicity . Today advise patient of need for completed page 2 of document and documentation of all household income o Collaborate with office to mail patient page 2 of application o Patient agrees to return completed page 2 and spouse's proof of income documents to office to be faxed to [place] for submission to patient assistance program  Patient Goals/Self-Care Activities . Over the next 90 days, patient will:  - check glucose, document, and provide at future appointments - check blood pressure, document, and provide at future appointments - collaborate with provider on medication access solutions  Follow Up Plan: The care management team will reach out to the patient again over the next 30 days.        Medication Assistance: Application for Trulicity  medication assistance program. in process.    Follow Up:  Patient agrees to Care Plan and Follow-up.  Harlow Asa, PharmD, [place] [phone]

## 2020-04-12 ENCOUNTER — Encounter: Payer: Self-pay | Admitting: Podiatry

## 2020-04-12 ENCOUNTER — Ambulatory Visit (INDEPENDENT_AMBULATORY_CARE_PROVIDER_SITE_OTHER): Payer: PPO

## 2020-04-12 ENCOUNTER — Other Ambulatory Visit: Payer: Self-pay

## 2020-04-12 ENCOUNTER — Ambulatory Visit: Payer: PPO | Admitting: Podiatry

## 2020-04-12 DIAGNOSIS — L97511 Non-pressure chronic ulcer of other part of right foot limited to breakdown of skin: Secondary | ICD-10-CM

## 2020-04-12 DIAGNOSIS — M2041 Other hammer toe(s) (acquired), right foot: Secondary | ICD-10-CM

## 2020-04-12 DIAGNOSIS — M778 Other enthesopathies, not elsewhere classified: Secondary | ICD-10-CM

## 2020-04-12 MED ORDER — AMOXICILLIN-POT CLAVULANATE 875-125 MG PO TABS: 1.0000 | ORAL_TABLET | Freq: Two times a day (BID) | ORAL | 0 refills | Status: DC

## 2020-04-12 NOTE — Progress Notes (Signed)
He presents today chief complaint of burning to the sec metatarsophalangeal joint dorsally is also complaining of knee pain he is also complaining of pain to his third toe on his right foot.  States that the tip of the toes callused and putting Neosporin on it.  Objective: Vital signs are stable alert oriented x3 pulses remain palpable.  There is erythema to the distal aspect of the toe from the PIPJ distally.  Also there is a small area of reactive hyperkeratosis or clavus distal toe.  I debrided that today revealing a diabetic ulceration.  Rigid hammertoe deformity is the cause of this.  Radiographs taken today do not currently demonstrate osteomyelitis to the distal phalanx.  Assessment diabetic ulceration.  Plan: Debrided the area today placed him on Augmentin x10 days placed a buttress pad and then also provided him with a silicone digital cover.  Also directed him on how to dress the toe on a daily basis.  Once the skin heals over he will then start using the silicone sleeve but he will continue to use the sulcus pad.  Also instructed him to use his [place] shoe which she has at home.  He will follow up with Dr. Sherryle Lis in 2 weeks.

## 2020-04-13 ENCOUNTER — Other Ambulatory Visit: Payer: Self-pay | Admitting: Podiatry

## 2020-04-13 DIAGNOSIS — M2041 Other hammer toe(s) (acquired), right foot: Secondary | ICD-10-CM

## 2020-04-26 ENCOUNTER — Ambulatory Visit: Payer: PPO | Admitting: Podiatry

## 2020-04-26 ENCOUNTER — Encounter: Payer: Self-pay | Admitting: Podiatry

## 2020-04-26 ENCOUNTER — Other Ambulatory Visit: Payer: Self-pay

## 2020-04-26 DIAGNOSIS — M2041 Other hammer toe(s) (acquired), right foot: Secondary | ICD-10-CM

## 2020-04-26 DIAGNOSIS — L97511 Non-pressure chronic ulcer of other part of right foot limited to breakdown of skin: Secondary | ICD-10-CM

## 2020-04-26 NOTE — Progress Notes (Signed)
  Subjective:  Patient ID: Kevin Perry, male    DOB: [birth year],  MRN: [id number]  Chief Complaint  Patient presents with  . Foot Ulcer    "I don't think its any better and it hurts like the dickens"    70 y.o. male presents with the above complaint. History confirmed with patient.  Saw Dr. Milinda Pointer a few weeks ago and he gave him a silicone offloading pad, he says he lost this so he has not worn it.  He has been taking antibiotics and denies any side effects  Objective:  Physical Exam: warm, good capillary refill and normal DP and PT pulses.  Previous right second toe amputation.  Severe hallux valgus and hammertoe deformities.  Third toe tip callus with ulceration measures 0.4 x 0.4 cm x 0.1 cm.  No signs of infection.  Hyperkeratosis surrounding this     Assessment:   1. Hammer toe of right foot   2. Ulcer of toe, right, limited to breakdown of skin [facility])      Plan:  Patient was evaluated and treated and all questions answered.  Ulcer right third toe -Debridement as below. -Dressed with Iodosorb, DSD. -Continue off-loading with silicone pads, I dispensed more these today -If not improved by next visit would consider flexor tenotomy percutaneously  Procedure: Excisional Debridement of Wound Rationale: Removal of non-viable soft tissue from the wound to promote healing.  Anesthesia: none Pre-Debridement Wound Measurements:  0.4 x 0.4 cm x 0.1 cm  Post-Debridement Wound Measurements: Same as predebridement Type of Debridement: Sharp Excisional Tissue Removed: Non-viable soft tissue Depth of Debridement: subcutaneous tissue. Technique: Sharp excisional debridement to bleeding, viable wound base.  Dressing: Dry, sterile, compression dressing. Disposition: Patient tolerated procedure well.          Return in about 2 weeks (around 05/10/2020) for wound re-check with Dr Milinda Pointer.

## 2020-05-01 ENCOUNTER — Telehealth: Payer: Self-pay | Admitting: Pharmacist

## 2020-05-01 ENCOUNTER — Telehealth: Payer: Self-pay

## 2020-05-01 NOTE — Telephone Encounter (Signed)
  Chronic Care Management   Outreach Note  05/01/2020 Name: Kevin Perry MRN: [id number] DOB: 1950/07/06  Referred by: [name], DO Reason for referral : No chief complaint on file.   Was unable to reach patient via telephone today and have left HIPAA compliant voicemail asking patient to return my call.    Follow Up Plan: Will collaborate with Care Guide to outreach to schedule follow up with me  Harlow Asa, PharmD, [place] Management [phone]

## 2020-05-02 ENCOUNTER — Telehealth: Payer: Self-pay | Admitting: Pharmacy Technician

## 2020-05-02 DIAGNOSIS — Z596 Low income: Secondary | ICD-10-CM

## 2020-05-02 NOTE — Progress Notes (Addendum)
                                         [facility])                                            [place] Team    05/02/2020  [place] [birth year] [id number]   Received both patient  and provider  portion(s) of patient assistance application(s) for Trulicity. Faxed completed application and required documents into [employer].   Jorden Minchey P. Deveion Denz, [place]  [phone]

## 2020-05-03 ENCOUNTER — Ambulatory Visit: Payer: PPO | Admitting: Pharmacist

## 2020-05-03 DIAGNOSIS — E1169 Type 2 diabetes mellitus with other specified complication: Secondary | ICD-10-CM | POA: Diagnosis not present

## 2020-05-03 DIAGNOSIS — E785 Hyperlipidemia, unspecified: Secondary | ICD-10-CM | POA: Diagnosis not present

## 2020-05-03 DIAGNOSIS — I10 Essential (primary) hypertension: Secondary | ICD-10-CM

## 2020-05-03 NOTE — Chronic Care Management (AMB) (Signed)
Chronic Care Management Pharmacy Note  05/03/2020 Name:  Kevin Perry MRN:  [id number] DOB:  06/07/1950  Subjective: Kevin Perry is an 70 y.o. year old male who is a primary patient of [name], DO.  The CCM team was consulted for assistance with disease management and care coordination needs.    Engaged with patient by telephone for follow up visit in response to provider referral for pharmacy case management and/or care coordination services.   Consent to Services:  The patient was given information about Chronic Care Management services, agreed to services, and gave verbal consent prior to initiation of services.  Please see initial visit note for detailed documentation.   Patient Care Team: [name], DO as PCP - General (Family Medicine) Winfield Cunas Virl Diamond, RPH-CPP (Pharmacist)  Recent office visits: none   Hospital visits: None in previous 6 months  Objective:  Lab Results  Component Value Date   CREATININE 0.97 03/02/2020   CREATININE 0.82 02/11/2019   CREATININE 0.73 01/01/2019    Lab Results  Component Value Date   HGBA1C 6.2 (H) 03/02/2020   Last diabetic Eye exam:  Lab Results  Component Value Date/Time   HMDIABEYEEXA Retinopathy (A) 11/04/2018 12:00 AM        Component Value Date/Time   CHOL 124 03/02/2020 1130   CHOL 153 07/27/2012 1335   TRIG 193 (H) 03/02/2020 1130   TRIG 212 (H) 07/27/2012 1335   HDL 44 03/02/2020 1130   HDL 34 (L) 07/27/2012 1335   CHOLHDL 2.8 03/02/2020 1130   VLDL 29 09/19/2015 1335   VLDL 42 (H) 07/27/2012 1335   LDLCALC 54 03/02/2020 1130   LDLCALC 77 07/27/2012 1335    Hepatic Function Latest Ref Rng & Units 03/02/2020 02/11/2019 01/01/2019  Total Protein 6.1 - 8.1 g/dL 6.6 6.5 7.3  Albumin 3.5 - 5.0 g/dL - - 3.5  AST 10 - 35 U/L 18 43(H) 28  ALT 9 - 46 U/L 20 53(H) 34  Alk Phosphatase 38 - 126 U/L - - 46  Total Bilirubin 0.2 - 1.2 mg/dL 0.6 0.9 1.2    Clinical ASCVD:  Yes  The ASCVD Risk score Mikey Bussing DC Jr., et al., 2013) failed to calculate for the following reasons:   The patient has a prior MI or stroke diagnosis     Social History   Tobacco Use  Smoking Status Former Smoker  . Quit date: 08/16/2004  . Years since quitting: 15.7  Smokeless Tobacco Former User  Tobacco Comment   quit 20 plus years   BP Readings from Last 3 Encounters:  03/02/20 (!) 148/76  12/08/19 118/68  11/17/19 (!) 152/75   Pulse Readings from Last 3 Encounters:  03/02/20 61  12/08/19 71  11/17/19 (!) 55   Wt Readings from Last 3 Encounters:  03/02/20 268 lb (121.6 kg)  12/08/19 262 lb 9.6 oz (119.1 kg)  11/17/19 260 lb (117.9 kg)    Assessment: Review of patient past medical history, allergies, medications, health status, including review of consultants reports, laboratory and other test data, was performed as part of comprehensive evaluation and provision of chronic care management services.   SDOH:  (Social Determinants of Health) assessments and interventions performed: none   CCM Care Plan  Allergies  Allergen Reactions  . Naproxen Swelling and Anaphylaxis    Medications Reviewed Today    Reviewed by [name], LPN (Licensed Practical Nurse) on 04/26/20 at 48  Med List Status: <None>  Medication  Order Taking? Sig Documenting Provider Last Dose Status Informant  acetaminophen (TYLENOL) 325 MG tablet [id number] No Take by mouth. [provider] Taking Active   amiodarone (PACERONE) 200 MG tablet [id number]  Take 200 mg by mouth daily. [provider]  Active   amLODipine (NORVASC) 5 MG tablet [id number] No Take 1 tablet (5 mg total) by mouth daily. [name], DO Taking Active   amoxicillin-clavulanate (AUGMENTIN) 875-125 MG tablet [id number]  Take 1 tablet by mouth 2 (two) times daily. [place], Max T, [place]  Active   aspirin 81 MG chewable tablet [id number] No Chew by mouth. [provider] Taking Active    atorvastatin (LIPITOR) 40 MG tablet [id number] No Take 1 tablet (40 mg total) by mouth daily. [name], DO Taking Active   ELIQUIS 5 MG TABS tablet [id number]  Take 5 mg by mouth 2 (two) times daily. [provider]  Active   escitalopram (LEXAPRO) 20 MG tablet [id number] No Take 1 tablet (20 mg total) by mouth at bedtime. [name], DO Taking Active   folic acid (FOLVITE) 1 MG tablet [id number] No Take 1 mg by mouth See admin instructions. Take 1 mg daily except skip dose on Wednesdays (methotrexate day) [provider] Taking Active Self  furosemide (LASIX) 40 MG tablet [id number] No Take 1 tablet (40 mg total) by mouth daily as needed.  Patient taking differently: Take 40 mg by mouth daily.   [name], DO Taking Active   glipiZIDE (GLUCOTROL) 10 MG tablet [id number] No Take 1 tablet (10 mg total) by mouth daily before breakfast. [name], DO Taking Active   Insulin Pen Needle 31G X 5 MM MISC [id number] No 1 each by Does not apply route daily. Luciana Axe, NP Taking Active Self  lisinopril (ZESTRIL) 20 MG tablet [id number] No Take 1 tablet (20 mg total) by mouth every evening.  Patient taking differently: Take 20 mg by mouth daily.   [name], DO Taking Active   loratadine (CLARITIN) 10 MG tablet [id number] No Take 10 mg by mouth every morning. [provider] Taking Active Self  metFORMIN (GLUCOPHAGE) 500 MG tablet [id number] No Take 2 in morning with breakfast and take 1 tab in evening with dinner [name], DO Taking Active   methotrexate (RHEUMATREX) 2.5 MG tablet [id number] No Take 7.5 mg by mouth every Wednesday. [provider] Taking Active Self  metoprolol tartrate (LOPRESSOR) 25 MG tablet [id number] No Take 25 mg by mouth 2 (two) times daily. [provider] Taking Active   nitroGLYCERIN (NITROSTAT) 0.4 MG SL tablet [id number] No Place under the tongue.  [provider] Taking Active   tamsulosin (FLOMAX) 0.4 MG CAPS capsule [id number] No Take 1 capsule (0.4 mg total) by mouth daily. [name], DO Taking Active   TRULICITY 1.5 IA/1.6PV SOPN [id number] No INJECT 1.5MG INTO SKIN ONCE WEEKLY  Patient taking differently: Inject 1.5 mg as directed every Wednesday.   [name], DO Taking Active           Patient Active Problem List   Diagnosis Date Noted  . Paroxysmal atrial fibrillation ([place]) 02/10/2020  . S/P CABG x 4 12/08/2019  . Post-op pain 11/27/2019  . Postoperative nausea 11/27/2019  . Moderate non-proliferative diabetic retinopathy ([place]) 10/08/2019  . History of MI (myocardial infarction) 08/10/2019  . Bilateral osteoarthritis of finger 07/17/2018  . Obesity (BMI 30.0-34.9) 12/31/2017  . Coronary artery disease involving native coronary artery of native  heart without angina pectoris 03/09/2015  . Essential hypertension 09/29/2014  . Type 2 diabetes mellitus with other specified complication ([place]) 46/50/3546  . Major depression in remission ([place]) 09/29/2014  . Hyperlipidemia associated with type 2 diabetes mellitus ([place]) 09/29/2014  . Edema of both lower extremities due to peripheral venous insufficiency 09/29/2014  . Anxiety 09/20/2014  . Primary osteoarthritis of knee 08/30/2014    Immunization History  Administered Date(s) Administered  . Fluad Quad(high Dose 65+) 10/28/2018  . Influenza, High Dose Seasonal PF 11/30/2019  . PFIZER(Purple Top)SARS-COV-2 Vaccination 03/26/2019, 04/16/2019  . Pneumococcal Conjugate-13 12/10/2016  . Pneumococcal Polysaccharide-23 12/31/2017    Conditions to be addressed/monitored: HTN, HLD and DMII  Care Plan : PharmD - Med Assistance  Updates made by Vella Raring, RPH-CPP since 05/03/2020 12:00 AM    Problem: Disease Progression     Long-Range Goal: Disease Progression Prevented or Minimized   Start Date: 03/27/2020  Expected End Date:  06/25/2020  This Visit's Progress: On track  Recent Progress: On track  Priority: High  Note:   Current Barriers:  . Unable to independently afford treatment regimen  Pharmacist Clinical Goal(s):  Marland Kitchen Over the next 90 days, patient will verbalize ability to afford treatment regimen through collaboration with PharmD and provider.   Interventions: . 1:1 collaboration with [name], DO regarding development and update of comprehensive plan of care as evidenced by provider attestation and co-signature . Inter-disciplinary care team collaboration (see longitudinal plan of care) . Receive voicemail from patient today requesting a call back  Medication Assistance: . Previously collaborated with office staff  o Missing page 2 of patient assistance application received from patient and forwarded to [facility] last week . From review of notes from [facility] Susy Frizzle today, completed patient assistance application faxed to [facility] on 3/29 . Today patient reports that he did not include spouses proof of income as he files taxes separately from spouse and claims a household size of "1" . Collaborate with [facility] . Patient states that he used up his last dose of Trulicity 1.5 mg weekly today and asks if the office might have any samples o Collaborate with Loni Muse, CMA with office via Secure chat. Raquel Sarna confirms office has sample of Trulicity 1.5 mg available and will set 1 box aside for patient to pick up. o Advise patient  Hypertension: . Current treatment: o Amlodipine 5 mg daily o Furosemide 40 mg daily o Lisinopril 20 mg daily o Metoprolol tartrate 25 mg BID . Today patient reports that he did reduce amlodpine dose as directed by [facility] on 1/26, currently taking amlodipine 33m daily . Reports recent home BP readings have been "good"; recalls BP yesterday: 122/79  . Encourage patient to continue to monitor home BP, keep log of results and bring log with  him to medical appointments  Hyperlipidemia: . Controlled; current treatment: atorvastatin 40 mg daily    Patient Goals/Self-Care Activities . Over the next 90 days, patient will:  - check glucose, document, and provide at future appointments - check blood pressure, document, and provide at future appointments - collaborate with provider on medication access solutions  Follow Up Plan: Telephone follow up appointment with care management team member scheduled for: 05/29/2020 at 1:15 PM       Medication Assistance: Application for Trulicity  medication assistance program. in process.   See plan of care for additional detail.  Patient's preferred pharmacy is:  [facility]  [place] [address] Phone: [phone] Fax: [phone]  [place], [place] - [address] [place] [address] Phone: [phone] Fax: [phone]  [facility] [place] [facility] [address] [place] [address] Phone: [phone] Fax: [phone]   Follow Up:  Patient agrees to Care Plan and Follow-up.  Plan: Telephone follow up appointment with care management team member scheduled for:  05/29/2020 at 1:15 PM   Harlow Asa, PharmD, [place], [place] [phone]

## 2020-05-03 NOTE — Patient Instructions (Signed)
Visit Information  PATIENT GOALS: Goals Addressed            This Visit's Progress   . Pharmacy Goals       Our goal bad cholesterol, or LDL, is less than 70 . This is why it is important to continue taking your atorvastatin  Please check your home blood pressure, keep a log of the results and bring this with you to your medical appointments.  Feel free to call me with any questions or concerns. I look forward to our next call!   Harlow Asa, PharmD, [place] [phone]       The patient verbalized understanding of instructions, educational materials, and care plan provided today and declined offer to receive copy of patient instructions, educational materials, and care plan.   Telephone follow up appointment with care management team member scheduled for: 05/29/2020 at 1:15 PM   Harlow Asa, PharmD, [place], [place] [phone]

## 2020-05-05 ENCOUNTER — Other Ambulatory Visit: Payer: Self-pay | Admitting: Family Medicine

## 2020-05-05 ENCOUNTER — Ambulatory Visit (INDEPENDENT_AMBULATORY_CARE_PROVIDER_SITE_OTHER): Payer: PPO | Admitting: Family Medicine

## 2020-05-05 ENCOUNTER — Encounter: Payer: Self-pay | Admitting: Family Medicine

## 2020-05-05 ENCOUNTER — Other Ambulatory Visit: Payer: Self-pay

## 2020-05-05 VITALS — BP 123/74 | HR 67 | Ht 75.0 in | Wt 270.4 lb

## 2020-05-05 DIAGNOSIS — E1142 Type 2 diabetes mellitus with diabetic polyneuropathy: Secondary | ICD-10-CM | POA: Diagnosis not present

## 2020-05-05 DIAGNOSIS — I872 Venous insufficiency (chronic) (peripheral): Secondary | ICD-10-CM

## 2020-05-05 DIAGNOSIS — I10 Essential (primary) hypertension: Secondary | ICD-10-CM

## 2020-05-05 MED ORDER — FUROSEMIDE 40 MG PO TABS
40.0000 mg | ORAL_TABLET | Freq: Every day | ORAL | 3 refills | Status: DC | PRN
Start: 2020-05-05 — End: 2021-05-31

## 2020-05-05 MED ORDER — GABAPENTIN 100 MG PO CAPS: ORAL_CAPSULE | ORAL | 1 refills | Status: DC

## 2020-05-05 MED ORDER — METOPROLOL TARTRATE 25 MG PO TABS: 25.0000 mg | ORAL_TABLET | Freq: Two times a day (BID) | ORAL | 3 refills | Status: DC

## 2020-05-05 NOTE — Progress Notes (Signed)
Subjective:    Patient ID: Kevin Perry, male    DOB: 01-12-1951, 70 y.o.   MRN: [id number]  Kevin Perry is a 70 y.o. male presenting on 05/05/2020 for Foot Pain   HPI   Bilateral Foot Neuropathy, diabetic Chronic type 2 diabetes - overall well controlled has ranged 6-8 range in past years Reports bilateral feet are bothering him more often now than before. He describes sensation of tingling and bee stinging sensation on feet, generalized not localized. He has type 2 diabetes, and it is overall well controlled. He has not had neuropathy before Symptoms worse after activity often. Rest does help it but the tingling does persist Followed by [facility] - Dr Lanae Crumbly, they are treating R foot hammer toe with improvement. He has had callus and ulceration in past  Recent Labs    08/10/19 0932 03/02/20 1130  HGBA1C 7.2* 6.2*     Depression screen [facility] 2/9 03/02/2020 08/10/2019 02/16/2019  Decreased Interest 0 0 0  Down, Depressed, Hopeless 0 0 0  PHQ - 2 Score 0 0 0  Altered sleeping 0 0 0  Tired, decreased energy 0 0 0  Change in appetite 0 0 0  Feeling bad or failure about yourself  0 0 0  Trouble concentrating 0 0 0  Moving slowly or fidgety/restless 0 0 0  Suicidal thoughts 0 0 0  PHQ-9 Score 0 0 0  Difficult doing work/chores Not difficult at all Not difficult at all Not difficult at all  Some recent data might be hidden    Social History   Tobacco Use  . Smoking status: Former Smoker    Quit date: 08/16/2004    Years since quitting: 15.7  . Smokeless tobacco: Former Systems developer  . Tobacco comment: quit 20 plus years  Vaping Use  . Vaping Use: Never used  Substance Use Topics  . Alcohol use: Yes    Alcohol/week: 2.0 standard drinks    Types: 2 Cans of beer per week    Comment: SOCIAL  . Drug use: No    Review of Systems Per HPI unless specifically indicated above     Objective:    BP 123/74   Pulse 67   Ht 6\' 3"  (1.905 m)   Wt 270 lb 6.4  oz (122.7 kg)   SpO2 98%   BMI 33.80 kg/m   Wt Readings from Last 3 Encounters:  05/05/20 270 lb 6.4 oz (122.7 kg)  03/02/20 268 lb (121.6 kg)  12/08/19 262 lb 9.6 oz (119.1 kg)    Physical Exam Vitals and nursing note reviewed.  Constitutional:      General: He is not in acute distress.    Appearance: He is well-developed. He is not diaphoretic.     Comments: Well-appearing, comfortable, cooperative  HENT:     Head: Normocephalic and atraumatic.  Eyes:     General:        Right eye: No discharge.        Left eye: No discharge.     Conjunctiva/sclera: Conjunctivae normal.  Cardiovascular:     Rate and Rhythm: Normal rate.  Pulmonary:     Effort: Pulmonary effort is normal.  Musculoskeletal:     Right lower leg: Edema (trace) present.     Left lower leg: Edema (trace) present.  Skin:    General: Skin is warm and dry.     Findings: No erythema or rash.  Neurological:     Mental Status: He  is alert and oriented to person, place, and time.  Psychiatric:        Behavior: Behavior normal.     Comments: Well groomed, good eye contact, normal speech and thoughts      Diabetic Foot Exam - Simple   Simple Foot Form Diabetic Foot exam was performed with the following findings: Yes 05/05/2020  2:38 PM  Visual Inspection See comments: Yes Sensation Testing See comments: Yes Pulse Check Posterior Tibialis and Dorsalis pulse intact bilaterally: Yes Comments Right foot with large bunion, hammer toe deformity 2nd toe, chronic callus formation toes and forefoot and heel, dry skin with some ulceration now without active ulcer. Right foot reduced generalized monofilament plantar and dorsal worse at toes. Left foot with reduced monofilament on plantar some intact on plantar, middle toe s/p partial amputation deformity, dry skin callus formation.     Results for orders placed or performed in visit on 03/02/20  Hemoglobin A1c  Result Value Ref Range   Hgb A1c MFr Bld 6.2 (H) <5.7 % of  total Hgb   Mean Plasma Glucose 131 mg/dL   eAG (mmol/L) 7.3 mmol/L  CBC with Differential/Platelet  Result Value Ref Range   WBC 5.6 3.8 - 10.8 Thousand/uL   RBC 4.96 4.20 - 5.80 Million/uL   Hemoglobin 14.8 13.2 - 17.1 g/dL   HCT 44.0 38.5 - 50.0 %   MCV 88.7 80.0 - 100.0 fL   MCH 29.8 27.0 - 33.0 pg   MCHC 33.6 32.0 - 36.0 g/dL   RDW 14.8 11.0 - 15.0 %   Platelets 169 140 - 400 Thousand/uL   MPV 9.9 7.5 - 12.5 fL   Neutro Abs 3,013 1,500 - 7,800 cells/uL   Lymphs Abs 2,016 850 - 3,900 cells/uL   Absolute Monocytes 364 200 - 950 cells/uL   Eosinophils Absolute 179 15 - 500 cells/uL   Basophils Absolute 28 0 - 200 cells/uL   Neutrophils Relative % 53.8 %   Total Lymphocyte 36.0 %   Monocytes Relative 6.5 %   Eosinophils Relative 3.2 %   Basophils Relative 0.5 %  COMPLETE METABOLIC PANEL WITH GFR  Result Value Ref Range   Glucose, Bld 120 (H) 65 - 99 mg/dL   BUN 9 7 - 25 mg/dL   Creat 0.97 0.70 - 1.25 mg/dL   GFR, Est Non African American 79 > OR = 60 mL/min/1.46m2   GFR, Est African American 92 > OR = 60 mL/min/1.51m2   BUN/Creatinine Ratio NOT APPLICABLE 6 - 22 (calc)   Sodium 139 135 - 146 mmol/L   Potassium 4.0 3.5 - 5.3 mmol/L   Chloride 100 98 - 110 mmol/L   CO2 30 20 - 32 mmol/L   Calcium 9.7 8.6 - 10.3 mg/dL   Total Protein 6.6 6.1 - 8.1 g/dL   Albumin 4.2 3.6 - 5.1 g/dL   Globulin 2.4 1.9 - 3.7 g/dL (calc)   AG Ratio 1.8 1.0 - 2.5 (calc)   Total Bilirubin 0.6 0.2 - 1.2 mg/dL   Alkaline phosphatase (APISO) 44 35 - 144 U/L   AST 18 10 - 35 U/L   ALT 20 9 - 46 U/L  Lipid panel  Result Value Ref Range   Cholesterol 124 <200 mg/dL   HDL 44 > OR = 40 mg/dL   Triglycerides 193 (H) <150 mg/dL   LDL Cholesterol (Calc) 54 mg/dL (calc)   Total CHOL/HDL Ratio 2.8 <5.0 (calc)   Non-HDL Cholesterol (Calc) 80 <130 mg/dL (calc)  PSA  Result  Value Ref Range   PSA 0.40 < OR = 4.0 ng/mL      Assessment & Plan:   Problem List Items Addressed This Visit   None    Visit Diagnoses    Diabetic polyneuropathy associated with type 2 diabetes mellitus ([place])    -  Primary   Relevant Medications   gabapentin (NEURONTIN) 100 MG capsule      Chronic diabetic neuropathy, worsening now Mostly well controlled DM now Last A1c in range On medication management for diabetes Followed by [facility] for multiple foot deformities and complications  Trial on Gabapentin 100mg  titration for neuropathic pain symptoms, dose adjust as advised. Offer OTC supplements per AVS as well Follow as planned for Diabetes.  Meds ordered this encounter  Medications  . gabapentin (NEURONTIN) 100 MG capsule    Sig: Start 1 capsule daily, increase by 1 cap every 2-3 days as tolerated up to 3 times a day, or may take 3 at once in evening.    Dispense:  90 capsule    Refill:  1     Follow up plan: Return if symptoms worsen or fail to improve.   [name], [facility] 05/05/2020, 2:33 PM

## 2020-05-05 NOTE — Patient Instructions (Addendum)
Thank you for coming to the office today.  Start Gabapentin 100mg  capsules, take at night for 2-3 nights only, and then increase to 2 times a day for a few days, and then may increase to 3 times a day, it may make you drowsy, if helps significantly at night only, then you can increase instead to 3 capsules at night, instead of 3 times a day - In the future if needed, we can significantly increase the dose if tolerated well, some common doses are 300mg  three times a day up to 600mg  three times a day, usually it takes several weeks or months to get to higher doses  Refilled meds.  Other supplement - Alpha Lipoic Acid 600mg  1-2 times a day or higher dose follow bottle instruction or Vitamin B6.    Please schedule a Follow-up Appointment to: Return if symptoms worsen or fail to improve.  If you have any other questions or concerns, please feel free to call the office or send a message through [place]. You may also schedule an earlier appointment if necessary.  Additionally, you may be receiving a survey about your experience at our office within a few days to 1 week by e-mail or mail. We value your feedback.  [name], DO [place]

## 2020-05-09 ENCOUNTER — Telehealth: Payer: Self-pay | Admitting: Pharmacy Technician

## 2020-05-09 DIAGNOSIS — Z596 Low income: Secondary | ICD-10-CM

## 2020-05-09 NOTE — Progress Notes (Signed)
[facility])                                            [place] Team    05/09/2020  Kevin Perry 14-Oct-1950 [id number]  Care coordination call placed to [place] in regards to patient's Trulicity application.  Spoke to [place] who informed patient was APPROVED 05/03/20-02/03/21. She informed a request was sent to the pharmacy on 05/06/2020 and that patient should be expecting a call about his shipment. She informed even if patient misses the call from [facility] then they will still ship the medication. Will outreach patient with this information.  Attempted to call patient to inform him of the above information but unfortunately he did not answer the call, HIPAA compliant voicemail was left for him to return the call.  Kevin Perry P. [name], [place]  [phone]

## 2020-05-15 ENCOUNTER — Other Ambulatory Visit: Payer: Self-pay

## 2020-05-15 ENCOUNTER — Ambulatory Visit: Payer: PPO | Admitting: Podiatry

## 2020-05-15 ENCOUNTER — Encounter: Payer: Self-pay | Admitting: Podiatry

## 2020-05-15 DIAGNOSIS — L97511 Non-pressure chronic ulcer of other part of right foot limited to breakdown of skin: Secondary | ICD-10-CM

## 2020-05-15 DIAGNOSIS — M205X9 Other deformities of toe(s) (acquired), unspecified foot: Secondary | ICD-10-CM

## 2020-05-15 DIAGNOSIS — M205X2 Other deformities of toe(s) (acquired), left foot: Secondary | ICD-10-CM

## 2020-05-15 NOTE — Patient Instructions (Signed)
Leave bandage in place and dry for 4 days, then remove. You may wash foot normally after removal of bandage. DO NOT SOAK FOOT! Dry completely afterwards and may use a bandaid over incision if needed. We will follow up with you in 1 weeks for recheck.  

## 2020-05-15 NOTE — Progress Notes (Signed)
He presents today for follow-up of ulceration third digit of his right foot.  States that this toe hurts me like crazy.  Objective: Vital signs are stable he is alert oriented x3 pulses are strong palpable to the toe.  The toe is rigid at the PIPJ with some plantar flexor he motion but very limited dorsiflexor emotion.  At this point he still has some reactive hyperkeratotic lesion to the distal aspect of the toe which I debrided today leading to and a small open ulcer distally.  He does have considerable flexor contraction of the toe and there is some extensor contraction as well.  Assessment: Chronic ulceration distal aspect third toe right foot flexor contracture of long tendon right extensor tendon as well.  Plan: Discussed etiology pathology and surgical therapies debrided the wound today and I also after obtaining consent to do so and performed a flexor tenotomy at the level of the PIPJ and was able to straighten the toe to some degree.  I also performed an extensor tenotomy at the level of the metatarsophalangeal joint.  Dermabond was placed at these incision sites dressed a compressive dressing was applied he is to leave this on until I see him next week.  Follow-up with him at that time.

## 2020-05-24 ENCOUNTER — Other Ambulatory Visit: Payer: Self-pay

## 2020-05-24 ENCOUNTER — Ambulatory Visit: Payer: PPO | Admitting: Podiatry

## 2020-05-24 DIAGNOSIS — M205X9 Other deformities of toe(s) (acquired), unspecified foot: Secondary | ICD-10-CM

## 2020-05-24 NOTE — Progress Notes (Signed)
He presents today for follow-up of a tenotomy third toe right foot.  He states that he feels fine and it looks like is healing better.  Objective: Vital signs are stable he is alert and oriented x3.  There is no erythema edema cellulitis drainage or odor incision sites gone on to heal uneventfully and the toe is laying rectus.  Assessment: Well-healing surgical toe third right.  Plan: He will follow-up with me on an as-needed basis.

## 2020-05-29 ENCOUNTER — Telehealth: Payer: Self-pay

## 2020-05-29 ENCOUNTER — Telehealth: Payer: Self-pay | Admitting: Pharmacist

## 2020-05-29 NOTE — Telephone Encounter (Signed)
  Chronic Care Management   Outreach Note  05/29/2020 Name: RAJAH TAGLIAFERRO MRN: [id number] DOB: [birth year]  Referred by: [name], DO Reason for referral : No chief complaint on file.   Was unable to reach patient via telephone today and have left HIPAA compliant voicemail asking patient to return my call.    Follow Up Plan: Will collaborate with Care Guide to outreach to schedule follow up with me  Harlow Asa, PharmD, [place] [facility] [phone]

## 2020-05-31 ENCOUNTER — Telehealth: Payer: Self-pay

## 2020-05-31 NOTE — Chronic Care Management (AMB) (Signed)
  Care Management   Note  05/31/2020 Name: Kevin Perry MRN: [id number] DOB: 28-Apr-1950  Kevin Perry is a 70 y.o. year old male who is a primary care patient of [name], DO and is actively engaged with the care management team. I reached out to [employer] by phone today to assist with re-scheduling a follow up visit with the Pharmacist  Follow up plan: Unsuccessful telephone outreach attempt made. A HIPAA compliant phone message was left for the patient providing contact information and requesting a return call.  The care management team will reach out to the patient again over the next 7 days.  If patient returns call to provider office, please advise to call [facility]  at [place], [place], [place], [place] [address] Direct Dial: [phone] [email or web address]: [email or web address]

## 2020-06-07 ENCOUNTER — Telehealth: Payer: Self-pay

## 2020-06-07 NOTE — Chronic Care Management (AMB) (Signed)
  Care Management   Note  06/07/2020 Name: Kevin Perry MRN: [id number] DOB: August 03, 1950  Kevin Perry is a 70 y.o. year old male who is a primary care patient of [name], DO and is actively engaged with the care management team. I reached out to [employer] by phone today to assist with re-scheduling a follow up visit with the Pharmacist  Follow up plan: Unsuccessful telephone outreach attempt made. A HIPAA compliant phone message was left for the patient providing contact information and requesting a return call.  The care management team will reach out to the patient again over the next 7 days.  If patient returns call to provider office, please advise to call [facility]  at [address], [place], [place], [place] [address] Direct Dial: [phone] [email or web address] Website: [email or web address]

## 2020-06-13 ENCOUNTER — Telehealth: Payer: Self-pay

## 2020-06-13 NOTE — Chronic Care Management (AMB) (Signed)
  Care Management   Note  06/13/2020 Name: Kevin Perry MRN: [id number] DOB: 1950/09/26  Kevin Perry is a 70 y.o. year old male who is a primary care patient of [name], DO and is actively engaged with the care management team. I reached out to [employer] by phone today to assist with re-scheduling a follow up visit with the Pharmacist  Follow up plan: Unable to make contact on outreach attempts x 3. PCP [name], DO notified via routed documentation in medical record.   Noreene Larsson, [place], [place], [place] [address] Direct Dial: [phone] [email or web address] Website: [email or web address]

## 2020-06-13 NOTE — Telephone Encounter (Signed)
Third unsuccessful outreach to reschedule f/u

## 2020-06-19 ENCOUNTER — Telehealth: Payer: Self-pay | Admitting: Family Medicine

## 2020-06-19 NOTE — Telephone Encounter (Signed)
Copied from [place] [phone]. Topic: Medicare AWV >> Jun 19, 2020 11:13 AM Cher Nakai R wrote: Reason for CRM:  Left message for patient to call back and schedule Medicare Annual Wellness Visit (AWV) to be done virtually or by telephone.  No hx of AWV eligible as of 05/06/2019 awvi  Please schedule at anytime with [facility] Health Advisor.      40 Minutes appointment   Any questions, please call me at [phone]

## 2020-06-20 ENCOUNTER — Other Ambulatory Visit: Payer: Self-pay | Admitting: Family Medicine

## 2020-06-20 DIAGNOSIS — E1142 Type 2 diabetes mellitus with diabetic polyneuropathy: Secondary | ICD-10-CM

## 2020-08-16 ENCOUNTER — Telehealth: Payer: Self-pay | Admitting: Family Medicine

## 2020-08-16 NOTE — Telephone Encounter (Signed)
Copied from [place] [phone]. Topic: Medicare AWV >> Aug 16, 2020  1:18 PM Weston Anna wrote: Left message for patient to call back and schedule Medicare Annual Wellness Visit (AWV) to be done virtually or by telephone.  No hx of AWV eligible as of 05/06/2019 awvi  Please schedule at anytime with [facility].      40 Minutes appointment   Any questions, please call me at [phone]  Reason for CRM:

## 2020-08-30 ENCOUNTER — Encounter: Payer: Self-pay | Admitting: Family Medicine

## 2020-08-30 ENCOUNTER — Other Ambulatory Visit: Payer: Self-pay

## 2020-08-30 ENCOUNTER — Ambulatory Visit (INDEPENDENT_AMBULATORY_CARE_PROVIDER_SITE_OTHER): Payer: PPO | Admitting: Family Medicine

## 2020-08-30 VITALS — BP 158/80 | HR 56 | Ht 75.0 in | Wt 272.0 lb

## 2020-08-30 DIAGNOSIS — F419 Anxiety disorder, unspecified: Secondary | ICD-10-CM

## 2020-08-30 DIAGNOSIS — E1169 Type 2 diabetes mellitus with other specified complication: Secondary | ICD-10-CM

## 2020-08-30 DIAGNOSIS — F325 Major depressive disorder, single episode, in full remission: Secondary | ICD-10-CM

## 2020-08-30 DIAGNOSIS — I10 Essential (primary) hypertension: Secondary | ICD-10-CM

## 2020-08-30 LAB — POCT GLYCOSYLATED HEMOGLOBIN (HGB A1C): Hemoglobin A1C: 6.2 % — AB (ref 4.0–5.6)

## 2020-08-30 MED ORDER — BUSPIRONE HCL 5 MG PO TABS
5.0000 mg | ORAL_TABLET | Freq: Two times a day (BID) | ORAL | 2 refills | Status: DC | PRN
Start: 2020-08-30 — End: 2021-03-08

## 2020-08-30 NOTE — Progress Notes (Signed)
Subjective:    Patient ID: Kevin Perry, male    DOB: 1950/09/01, 70 y.o.   MRN: [id number]  Kevin Perry is a 70 y.o. male presenting on 08/30/2020 for Diabetes   HPI  CHRONIC DM, Type 2: Doing well. Meds: Trulicity 1.'5mg'$  weekly, Glipizide '10mg'$  daily, Metformin 2AM and 1 PM Reports good compliance. Tolerating well w/o side-effects Currently on ACEi Lifestyle: - Diet (trying to improve diet reduce carb intake) - Exercise (walk regularly) Need DM Eye exam Denies hypoglycemia, polyuria, visual changes, numbness or tingling.   CHRONIC HTN: Admits stressors recently. No change in medication Current Meds - Amlodipine '5mg'$  daily (now), Furosemide '40mg'$  dailiy, Lisinopril '20mg'$  daily, Metoprolol XL '50mg'$  daily   Reports good compliance, took meds today. Tolerating well, w/o complaints. Denies CP, dyspnea, HA, edema, dizziness / lightheadedness  Anxiety Depression in remission Worsening life stressors lately, impacting his blood pressure. He says temporary stress right now is very high. On escitalopram helps control mood but now anxiety is uncontrolled      Depression screen [facility] 2/9 08/30/2020 03/02/2020 08/10/2019  Decreased Interest 0 0 0  Down, Depressed, Hopeless 0 0 0  PHQ - 2 Score 0 0 0  Altered sleeping 0 0 0  Tired, decreased energy 0 0 0  Change in appetite 0 0 0  Feeling bad or failure about yourself  0 0 0  Trouble concentrating 0 0 0  Moving slowly or fidgety/restless 0 0 0  Suicidal thoughts 0 0 0  PHQ-9 Score 0 0 0  Difficult doing work/chores Not difficult at all Not difficult at all Not difficult at all  Some recent data might be hidden   GAD 7 : Generalized Anxiety Score 08/30/2020 07/17/2018 03/12/2017 03/09/2015  Nervous, Anxious, on Edge 1 0 0 0  Control/stop worrying 0 0 0 0  Worry too much - different things 0 0 0 0  Trouble relaxing 1 0 0 0  Restless 0 0 0 0  Easily annoyed or irritable 1 0 0 0  Afraid - awful might happen 0 0 0 0  Total GAD 7 Score 3  0 0 0  Anxiety Difficulty Not difficult at all Not difficult at all Not difficult at all Not difficult at all      Social History   Tobacco Use   Smoking status: Former    Types: Cigarettes    Quit date: 08/16/2004    Years since quitting: 16.0   Smokeless tobacco: Former   Tobacco comments:    quit 20 plus years  Vaping Use   Vaping Use: Never used  Substance Use Topics   Alcohol use: Yes    Alcohol/week: 2.0 standard drinks    Types: 2 Cans of beer per week    Comment: SOCIAL   Drug use: No    Review of Systems Per HPI unless specifically indicated above     Objective:    BP (!) 158/80 (BP Location: Left Arm, Cuff Size: Normal)   Pulse (!) 56   Ht '6\' 3"'$  (1.905 m)   Wt 272 lb (123.4 kg)   SpO2 93%   BMI 34.00 kg/m   Wt Readings from Last 3 Encounters:  08/30/20 272 lb (123.4 kg)  05/05/20 270 lb 6.4 oz (122.7 kg)  03/02/20 268 lb (121.6 kg)    Physical Exam Vitals and nursing note reviewed.  Constitutional:      General: He is not in acute distress.    Appearance: He is well-developed. He  is not diaphoretic.     Comments: Well-appearing, comfortable, cooperative  HENT:     Head: Normocephalic and atraumatic.  Eyes:     General:        Right eye: No discharge.        Left eye: No discharge.     Conjunctiva/sclera: Conjunctivae normal.  Neck:     Thyroid: No thyromegaly.  Cardiovascular:     Rate and Rhythm: Normal rate and regular rhythm.     Pulses: Normal pulses.     Heart sounds: Normal heart sounds. No murmur heard. Pulmonary:     Effort: Pulmonary effort is normal. No respiratory distress.     Breath sounds: Normal breath sounds. No wheezing or rales.  Musculoskeletal:        General: Normal range of motion.     Cervical back: Normal range of motion and neck supple.  Lymphadenopathy:     Cervical: No cervical adenopathy.  Skin:    General: Skin is warm and dry.     Findings: No erythema or rash.  Neurological:     Mental Status: He is  alert and oriented to person, place, and time. Mental status is at baseline.  Psychiatric:        Behavior: Behavior normal.     Comments: Well groomed, good eye contact, normal speech and thoughts     Results for orders placed or performed in visit on 08/30/20  POCT HgB A1C  Result Value Ref Range   Hemoglobin A1C 6.2 (A) 4.0 - 5.6 %      Assessment & Plan:   Problem List Items Addressed This Visit     Type 2 diabetes mellitus with other specified complication ([place]) - Primary    Improved A1c 6.2, two results in a row well controlled Complicated by Hyperglycemia, Hyperlipidemia, Mood, Cataract (s/p surgery), DM Retinopathy - increase risk of cardiovascular abnormality w/ known CAD - Prior Meds: Onglyza (ins/cost), Metformin XR 750 (GI intolerance)  Plan:  1. Continue increased Metformin IR '500mg'$  x 2 in AM / x 1 in PM - CONTINUE current meds - Trulicity 1.'5mg'$  weekly [place] inj, GLipizide '10mg'$  daily - Counseling on risk hypoglycemia on sulfonylurea  We discussed option in future of increase Trulicity to 3.'0mg'$  weekly and reduce or Stop Glipizide - may do this in future if indicated. His Trulicity is from PAP from manufacturer would need to submit change of dose form. Reconsider in 3 months.  2. Encourage improved lifestyle - low carb, low sugar diet, reduce portion size, goal for regular balanced 3 x daily meal instead of skipping lunch, continue improving regular exercise 3. Check CBG, bring log to next visit for review - check 3x weekly 4. Continue ASA, ACEi, Statin      Relevant Orders   POCT HgB A1C (Completed)   Major depression in remission (HCC)    Stable, controlled in remission on SSRI Continue current therapy       Relevant Medications   busPIRone (BUSPAR) 5 MG tablet   Essential hypertension    Elevated BP today, w/ stress anxiety - Home BP readings normal previously History of MI / CAD. Peripheral edema   Plan:  1. Add anxiety med - if not improving BP, we can  adjust his BP meds or he can return to Dr Ubaldo Glassing, Cardiology had lowered his Amlodipine from 10 to '5mg'$  recently this may be related - For now -  Continue current BP regimen - Amlodipine '5mg'$  daily, Metoprolol 25 BID, Furosemide  $'40mg'a$  daily 2. Encourage improved lifestyle - low sodium diet, regular exercise 3. Continue monitor BP outside office, bring readings to next visit, if persistently >140/90 or new symptoms notify office sooner        Anxiety    Elevated anxiety now due to stressors On Escitalopram '20mg'$  daily, overall helps but not controlled. Suspected adjustment to current life stressors acutely Add Buspar '5mg'$  1-2 times a day max '10mg'$  BID dosing, reviewed dosing regimen.       Relevant Medications   busPIRone (BUSPAR) 5 MG tablet    Meds ordered this encounter  Medications   busPIRone (BUSPAR) 5 MG tablet    Sig: Take 1 tablet (5 mg total) by mouth 2 (two) times daily as needed (anxiety).    Dispense:  60 tablet    Refill:  2    Follow up plan: Return in about 3 months (around 11/30/2020) for 3 month follow-up DM A1c, HTN, Anxiety.   [name], [facility] 08/30/2020, 11:04 AM

## 2020-08-30 NOTE — Patient Instructions (Addendum)
Thank you for coming to the office today.  BP improved on recheck, but still elevated.  Start Buspar anxiety medication, '5mg'$  1-2 times a day morning and afternoon, can increase a little bit if needed, max dose 2 pills twice a day.  Keep on Lexapro for now.  If anxiety doesn't help blood pressure, then we may need to adjust other BP med, and can also talk w/ Dr Ubaldo Glassing  For sugar, excellent result  Recent Labs    03/02/20 1130 08/30/20 1105  HGBA1C 6.2* 6.2*   I would offer - increase Trulicity from 1.5 up to 3.0, and we could try DISCONTINUE Glipizide if you want. This is an option, can review in 3 months.  Please schedule a Follow-up Appointment to: Return in about 3 months (around 11/30/2020) for 3 month follow-up DM A1c, HTN, Anxiety.  If you have any other questions or concerns, please feel free to call the office or send a message through [place]. You may also schedule an earlier appointment if necessary.  Additionally, you may be receiving a survey about your experience at our office within a few days to 1 week by e-mail or mail. We value your feedback.  [name], DO [place]

## 2020-08-30 NOTE — Assessment & Plan Note (Signed)
Elevated BP today, w/ stress anxiety - Home BP readings normal previously History of MI / CAD. Peripheral edema   Plan:  1. Add anxiety med - if not improving BP, we can adjust his BP meds or he can return to Dr Ubaldo Glassing, Cardiology had lowered his Amlodipine from 10 to '5mg'$  recently this may be related - For now -  Continue current BP regimen - Amlodipine '5mg'$  daily, Metoprolol 25 BID, Furosemide '40mg'$  daily 2. Encourage improved lifestyle - low sodium diet, regular exercise 3. Continue monitor BP outside office, bring readings to next visit, if persistently >140/90 or new symptoms notify office sooner

## 2020-08-30 NOTE — Assessment & Plan Note (Signed)
Improved A1c 6.2, two results in a row well controlled Complicated by Hyperglycemia, Hyperlipidemia, Mood, Cataract (s/p surgery), DM Retinopathy - increase risk of cardiovascular abnormality w/ known CAD - Prior Meds: Onglyza (ins/cost), Metformin XR 750 (GI intolerance)  Plan:  1. Continue increased Metformin IR '500mg'$  x 2 in AM / x 1 in PM - CONTINUE current meds - Trulicity 1.'5mg'$  weekly [place] inj, GLipizide '10mg'$  daily - Counseling on risk hypoglycemia on sulfonylurea  We discussed option in future of increase Trulicity to 3.'0mg'$  weekly and reduce or Stop Glipizide - may do this in future if indicated. His Trulicity is from PAP from manufacturer would need to submit change of dose form. Reconsider in 3 months.  2. Encourage improved lifestyle - low carb, low sugar diet, reduce portion size, goal for regular balanced 3 x daily meal instead of skipping lunch, continue improving regular exercise 3. Check CBG, bring log to next visit for review - check 3x weekly 4. Continue ASA, ACEi, Statin

## 2020-08-30 NOTE — Assessment & Plan Note (Signed)
Elevated anxiety now due to stressors On Escitalopram '20mg'$  daily, overall helps but not controlled. Suspected adjustment to current life stressors acutely Add Buspar '5mg'$  1-2 times a day max '10mg'$  BID dosing, reviewed dosing regimen.

## 2020-08-30 NOTE — Assessment & Plan Note (Signed)
Stable, controlled in remission on SSRI Continue current therapy

## 2020-09-05 DIAGNOSIS — L570 Actinic keratosis: Secondary | ICD-10-CM | POA: Diagnosis not present

## 2020-09-05 DIAGNOSIS — Z85828 Personal history of other malignant neoplasm of skin: Secondary | ICD-10-CM | POA: Diagnosis not present

## 2020-09-05 DIAGNOSIS — D485 Neoplasm of uncertain behavior of skin: Secondary | ICD-10-CM | POA: Diagnosis not present

## 2020-09-06 DIAGNOSIS — E1169 Type 2 diabetes mellitus with other specified complication: Secondary | ICD-10-CM | POA: Diagnosis not present

## 2020-09-06 DIAGNOSIS — Z951 Presence of aortocoronary bypass graft: Secondary | ICD-10-CM | POA: Diagnosis not present

## 2020-09-06 DIAGNOSIS — I2510 Atherosclerotic heart disease of native coronary artery without angina pectoris: Secondary | ICD-10-CM | POA: Diagnosis not present

## 2020-09-06 DIAGNOSIS — I10 Essential (primary) hypertension: Secondary | ICD-10-CM | POA: Diagnosis not present

## 2020-09-06 DIAGNOSIS — I480 Paroxysmal atrial fibrillation: Secondary | ICD-10-CM | POA: Diagnosis not present

## 2020-09-06 DIAGNOSIS — E785 Hyperlipidemia, unspecified: Secondary | ICD-10-CM | POA: Diagnosis not present

## 2020-09-06 DIAGNOSIS — E118 Type 2 diabetes mellitus with unspecified complications: Secondary | ICD-10-CM | POA: Diagnosis not present

## 2020-09-06 DIAGNOSIS — E7849 Other hyperlipidemia: Secondary | ICD-10-CM | POA: Diagnosis not present

## 2020-09-11 ENCOUNTER — Ambulatory Visit (INDEPENDENT_AMBULATORY_CARE_PROVIDER_SITE_OTHER): Payer: PPO | Admitting: Pharmacist

## 2020-09-11 DIAGNOSIS — E1169 Type 2 diabetes mellitus with other specified complication: Secondary | ICD-10-CM

## 2020-09-11 DIAGNOSIS — I10 Essential (primary) hypertension: Secondary | ICD-10-CM

## 2020-09-11 NOTE — Patient Instructions (Signed)
Visit Information  PATIENT GOALS:  Goals Addressed             This Visit's Progress    Pharmacy Goals       Our goal bad cholesterol, or LDL, is less than 70 . This is why it is important to continue taking your atorvastatin  Please check your home blood pressure, keep a log of the results and have this to review during our next appointment.  Feel free to call me with any questions or concerns. I look forward to our next call!   Wallace Cullens, PharmD, [place] [phone]        The patient verbalized understanding of instructions, educational materials, and care plan provided today and declined offer to receive copy of patient instructions, educational materials, and care plan.   Telephone follow up appointment with care management team member scheduled for: 8/22 at 1:15 pm \\

## 2020-09-11 NOTE — Chronic Care Management (AMB) (Signed)
Chronic Care Management Pharmacy Note  09/11/2020 Name:  Kevin Perry MRN:  [id number] DOB:  11-24-1950   Subjective: Kevin Perry is an 70 y.o. year old male who is a primary patient of [name], DO.  The CCM team was consulted for assistance with disease management and care coordination needs.    Engaged with patient by telephone for follow up visit in response to provider referral for pharmacy case management and/or care coordination services.   Consent to Services:  The patient was given information about Chronic Care Management services, agreed to services, and gave verbal consent prior to initiation of services.  Please see initial visit note for detailed documentation.   Patient Care Team: [name], DO as PCP - General (Family Medicine) Curley Spice Virl Diamond, RPH-CPP (Pharmacist)  Recent office visits: Office Visit with PCP on 7/27  Recent consult visits: Office Visit with Cardiologist on 8/3 for follow up of HTN and atrial fibrillation and shortness of breath  Hospital visits: None in previous 6 months  Objective:  Lab Results  Component Value Date   CREATININE 0.97 03/02/2020   CREATININE 0.82 02/11/2019   CREATININE 0.73 01/01/2019    Lab Results  Component Value Date   HGBA1C 6.2 (A) 08/30/2020   Last diabetic Eye exam:  Lab Results  Component Value Date/Time   HMDIABEYEEXA Retinopathy (A) 11/04/2018 12:00 AM    Last diabetic Foot exam: No results found for: HMDIABFOOTEX      Component Value Date/Time   CHOL 124 03/02/2020 1130   CHOL 153 07/27/2012 1335   TRIG 193 (H) 03/02/2020 1130   TRIG 212 (H) 07/27/2012 1335   HDL 44 03/02/2020 1130   HDL 34 (L) 07/27/2012 1335   CHOLHDL 2.8 03/02/2020 1130   VLDL 29 09/19/2015 1335   VLDL 42 (H) 07/27/2012 1335   LDLCALC 54 03/02/2020 1130   LDLCALC 77 07/27/2012 1335    Hepatic Function Latest Ref Rng & Units 03/02/2020 02/11/2019 01/01/2019  Total Protein 6.1 -  8.1 g/dL 6.6 6.5 7.3  Albumin 3.5 - 5.0 g/dL - - 3.5  AST 10 - 35 U/L 18 43(H) 28  ALT 9 - 46 U/L 20 53(H) 34  Alk Phosphatase 38 - 126 U/L - - 46  Total Bilirubin 0.2 - 1.2 mg/dL 0.6 0.9 1.2    Clinical ASCVD: Yes  The ASCVD Risk score Mikey Bussing DC Jr., et al., 2013) failed to calculate for the following reasons:   The patient has a prior MI or stroke diagnosis    Social History   Tobacco Use  Smoking Status Former   Types: Cigarettes   Quit date: 08/16/2004   Years since quitting: 16.0  Smokeless Tobacco Former  Tobacco Comments   quit 20 plus years   BP Readings from Last 3 Encounters:  08/30/20 (!) 158/80  05/05/20 123/74  03/02/20 (!) 148/76   Pulse Readings from Last 3 Encounters:  08/30/20 (!) 56  05/05/20 67  03/02/20 61   Wt Readings from Last 3 Encounters:  08/30/20 272 lb (123.4 kg)  05/05/20 270 lb 6.4 oz (122.7 kg)  03/02/20 268 lb (121.6 kg)    Assessment: Review of patient past medical history, allergies, medications, health status, including review of consultants reports, laboratory and other test data, was performed as part of comprehensive evaluation and provision of chronic care management services.   SDOH:  (Social Determinants of Health) assessments and interventions performed: none   CCM Care Plan  Allergies  Allergen Reactions   Naproxen Swelling and Anaphylaxis    Medications Reviewed Today     Reviewed by [name], RPH-CPP (Pharmacist) on 09/11/20 at 1509  Med List Status: <None>   Medication Order Taking? Sig Documenting Provider Last Dose Status Informant  acetaminophen (TYLENOL) 325 MG tablet [id number]  Take by mouth. [provider]  Active   amLODipine (NORVASC) 5 MG tablet [id number] Yes Take 1 tablet (5 mg total) by mouth daily. [name], DO Taking Active   aspirin 81 MG chewable tablet [id number]  Chew by mouth. [provider]  Active   atorvastatin (LIPITOR) 40 MG tablet [id number]  Take  1 tablet (40 mg total) by mouth daily. Karamalegos, [name], DO  Active   busPIRone (BUSPAR) 5 MG tablet [id number] Yes Take 1 tablet (5 mg total) by mouth 2 (two) times daily as needed (anxiety). [name], DO Taking Active   escitalopram (LEXAPRO) 20 MG tablet [id number]  Take 1 tablet (20 mg total) by mouth at bedtime. Karamalegos, [name], DO  Active   folic acid (FOLVITE) 1 MG tablet [id number]  Take 1 mg by mouth See admin instructions. Take 1 mg daily except skip dose on Wednesdays (methotrexate day) [provider]  Active Self  furosemide (LASIX) 40 MG tablet [id number] Yes Take 1 tablet (40 mg total) by mouth daily as needed. [name], DO Taking Active   gabapentin (NEURONTIN) 100 MG capsule [id number]  TAKE 1 CAPSULE BY MOUTH ONCE DAILY INCREASE BY 1 CAPSULE EVERY 2-3 DAYS AS TOLERATED UPTO 3X A DAY OR 3 CAPSULESONCE IN EVENING Karamalegos, Alexander Lenna Sciara, DO  Active   glipiZIDE (GLUCOTROL) 10 MG tablet [id number] Yes Take 1 tablet (10 mg total) by mouth daily before breakfast. [name], DO Taking Active   Insulin Pen Needle 31G X 5 MM MISC [id number]  1 each by Does not apply route daily. Luciana Axe, NP  Active Self  lisinopril (ZESTRIL) 20 MG tablet [id number] Yes Take 1 tablet by mouth 2 (two) times daily. [provider] Taking Active   loratadine (CLARITIN) 10 MG tablet [id number]  Take 10 mg by mouth every morning. [provider]  Active Self  metFORMIN (GLUCOPHAGE) 500 MG tablet [id number] Yes Take 2 in morning with breakfast and take 1 tab in evening with dinner [name], DO Taking Active   methotrexate (RHEUMATREX) 2.5 MG tablet [id number]  Take 7.5 mg by mouth every Wednesday. [provider]  Active Self  metoprolol tartrate (LOPRESSOR) 25 MG tablet [id number] Yes Take 1 tablet (25 mg total) by mouth 2 (two) times daily. [name], DO Taking Active   nitroGLYCERIN  (NITROSTAT) 0.4 MG SL tablet [id number]  Place under the tongue. [provider]  Active   tamsulosin (FLOMAX) 0.4 MG CAPS capsule [id number]  Take 1 capsule (0.4 mg total) by mouth daily. [name], DO  Active   TRULICITY 1.5 MP/5.3IR [name] [id number] Yes INJECT 1.5MG INTO SKIN ONCE WEEKLY  Patient taking differently: Inject 1.5 mg as directed every Wednesday.   [name], DO Taking Active             Patient Active Problem List   Diagnosis Date Noted   Paroxysmal atrial fibrillation ([place]) 02/10/2020   S/P CABG x 4 12/08/2019   Post-op pain 11/27/2019   Postoperative nausea 11/27/2019   Moderate non-proliferative diabetic retinopathy ([place]) 10/08/2019   History of MI (myocardial infarction) 08/10/2019   Bilateral osteoarthritis  of finger 07/17/2018   Obesity (BMI 30.0-34.9) 12/31/2017   Coronary artery disease involving native coronary artery of native heart without angina pectoris 03/09/2015   Essential hypertension 09/29/2014   Type 2 diabetes mellitus with other specified complication ([place]) 00/37/0488   Major depression in remission ([place]) 09/29/2014   Hyperlipidemia associated with type 2 diabetes mellitus ([place]) 09/29/2014   Edema of both lower extremities due to peripheral venous insufficiency 09/29/2014   Anxiety 09/20/2014   Primary osteoarthritis of knee 08/30/2014    Immunization History  Administered Date(s) Administered   Fluad Quad(high Dose 65+) 10/28/2018   Influenza, High Dose Seasonal PF 11/30/2019   PFIZER(Purple Top)SARS-COV-2 Vaccination 03/26/2019, 04/16/2019   Pneumococcal Conjugate-13 12/10/2016   Pneumococcal Polysaccharide-23 12/31/2017    Conditions to be addressed/monitored: CAD, HTN, HLD, and DMII  Care Plan : PharmD - Med Assistance/Management  Updates made by [name], RPH-CPP since 09/11/2020 12:00 AM     Problem: Disease Progression      Long-Range Goal: Disease Progression Prevented or  Minimized   Start Date: 03/27/2020  Expected End Date: 06/25/2020  This Visit's Progress: On track  Recent Progress: On track  Priority: High  Note:   Current Barriers:  Unable to independently afford treatment regimen Patient APPROVED for Trulicity from [facility] Patient Assistance Program 05/03/20-02/03/21  Pharmacist Clinical Goal(s):  Over the next 90 days, patient will verbalize ability to afford treatment regimen through collaboration with PharmD and provider.   Interventions: 1:1 collaboration with [name], DO regarding development and update of comprehensive plan of care as evidenced by provider attestation and co-signature Inter-disciplinary care team collaboration (see longitudinal plan of care) Perform chart review Patient seen for Office Visit with PCP on 7/27 Provider discussed option in future of increase Trulicity to 8.9VQ weekly and reduce or Stop Glipizide Provider advised patient to start buspirone 5 mg BID prn for anxiety Office Visit with Cardiologist on 8/3 for follow up of HTN and atrial fibrillation and shortness of breath. Provider advised: Will repeat echocardiogram Increased dose of lisinopril ordered (lisinopril 20 mg twice daily) Patient reports echocardiogram scheduled for 8/29 Reports taking buspirone 5 mg BID prn for anxiety as directed by PCP From review of chart, note both amiodarone and Eliquis discontinued by Cardiologist on 03/01/2020 (and no longer on [facility] medication list), but listed in local record. Patient confirms stopped taking and has not restarted. Update local medication list accordingly.  T2DM: Controlled; current treatment: Metformin IR 577m x 2 in AM / x 1 in PM Trulicity 1.5 mg weekly Glipizide 10 mg daily before breakfast Reports recent home morning fasting readings ranging: 135-150 Patient denies any recent low blood sugars Counsel on diabetes medication management and reiterate recommendation to  increase Trulicity dose to 329mweekly with plan to reduce or stop glipizide Patient verbalizes understanding, but expresses current preference to wait on making this adjustment  Hypertension: Current treatment: Amlodipine 5 mg daily Furosemide 40 mg daily Lisinopril 20 mg twice daily (dose increased by Cardiologist on 8/3) Metoprolol tartrate 25 mg BID Reports monitoring home BP using upper arm monitor Recalls reading from yesterday evening: ~160/88 Counsel on BP monitoring technique, including resting prior to readings and avoiding caffeinated beverages at least 30 minutes prior to readings Counsel on impact of caffeine on blood pressure and heart rate Reports drinks coffee throughout the day Encourage patient to reduce caffeine intake Agree to follow up for BP readings in 2 weeks. Counsel patient to continue to monitor home BP, keep  log of results and have this log to review at that time. Counsel patient if BP readings persistently >140/90 or new symptoms notify Cardiology or PCP office sooner  Hyperlipidemia: Controlled; current treatment: atorvastatin 40 mg daily   Patient Goals/Self-Care Activities Over the next 90 days, patient will:  - check glucose, document, and provide at future appointments - check blood pressure, document, and provide at future appointments - collaborate with provider on medication access solutions  Follow Up Plan: Telephone follow up appointment with care management team member scheduled for: 8/22 at 1:15 pm      Medication Assistance: Patient APPROVED for Trulicity from [facility] Patient Assistance Program 05/03/20-02/03/21  Patient's preferred pharmacy is:  [facility], [address]. [place] [address] Phone: [phone] Fax: [phone]  [facility] [address] [place] - [place] [address] Phone: [phone] Fax: [phone]  [facility] [place] [facility] [address] [place] [address] Phone: [phone] Fax: [phone]   Follow Up:  Patient agrees to Care Plan and Follow-up.  Wallace Cullens, PharmD, Para [place], CPP Clinical Pharmacist [facility] [phone]

## 2020-09-25 ENCOUNTER — Ambulatory Visit: Payer: PPO | Admitting: Pharmacist

## 2020-09-25 DIAGNOSIS — E1169 Type 2 diabetes mellitus with other specified complication: Secondary | ICD-10-CM

## 2020-09-25 DIAGNOSIS — I10 Essential (primary) hypertension: Secondary | ICD-10-CM

## 2020-09-25 NOTE — Chronic Care Management (AMB) (Signed)
Chronic Care Management Pharmacy Note  09/25/2020 Name:  Kevin Perry MRN:  [id number] DOB:  [birth year]   Subjective: Kevin Perry is an 70 y.o. year old male who is a primary patient of [name], DO.  The CCM team was consulted for assistance with disease management and care coordination needs.    Engaged with patient by telephone for follow up visit in response to provider referral for pharmacy case management and/or care coordination services.   Consent to Services:  The patient was given information about Chronic Care Management services, agreed to services, and gave verbal consent prior to initiation of services.  Please see initial visit note for detailed documentation.   Patient Care Team: [name], DO as PCP - General (Family Medicine) Curley Spice Virl Diamond, RPH-CPP (Pharmacist)  Recent office visits: None  Hospital visits: None in previous 6 months  Objective:  Lab Results  Component Value Date   CREATININE 0.97 03/02/2020   CREATININE 0.82 02/11/2019   CREATININE 0.73 01/01/2019    Lab Results  Component Value Date   HGBA1C 6.2 (A) 08/30/2020   Last diabetic Eye exam:  Lab Results  Component Value Date/Time   HMDIABEYEEXA Retinopathy (A) 11/04/2018 12:00 AM    Last diabetic Foot exam: No results found for: HMDIABFOOTEX      Component Value Date/Time   CHOL 124 03/02/2020 1130   CHOL 153 07/27/2012 1335   TRIG 193 (H) 03/02/2020 1130   TRIG 212 (H) 07/27/2012 1335   HDL 44 03/02/2020 1130   HDL 34 (L) 07/27/2012 1335   CHOLHDL 2.8 03/02/2020 1130   VLDL 29 09/19/2015 1335   VLDL 42 (H) 07/27/2012 1335   LDLCALC 54 03/02/2020 1130   LDLCALC 77 07/27/2012 1335    Hepatic Function Latest Ref Rng & Units 03/02/2020 02/11/2019 01/01/2019  Total Protein 6.1 - 8.1 g/dL 6.6 6.5 7.3  Albumin 3.5 - 5.0 g/dL - - 3.5  AST 10 - 35 U/L 18 43(H) 28  ALT 9 - 46 U/L 20 53(H) 34  Alk Phosphatase 38 - 126 U/L - - 46  Total  Bilirubin 0.2 - 1.2 mg/dL 0.6 0.9 1.2     Clinical ASCVD: Yes  The ASCVD Risk score Mikey Bussing DC Jr., et al., 2013) failed to calculate for the following reasons:   The patient has a prior MI or stroke diagnosis     Social History   Tobacco Use  Smoking Status Former   Types: Cigarettes   Quit date: 08/16/2004   Years since quitting: 16.1  Smokeless Tobacco Former  Tobacco Comments   quit 20 plus years   BP Readings from Last 3 Encounters:  08/30/20 (!) 158/80  05/05/20 123/74  03/02/20 (!) 148/76   Pulse Readings from Last 3 Encounters:  08/30/20 (!) 56  05/05/20 67  03/02/20 61   Wt Readings from Last 3 Encounters:  08/30/20 272 lb (123.4 kg)  05/05/20 270 lb 6.4 oz (122.7 kg)  03/02/20 268 lb (121.6 kg)    Assessment: Review of patient past medical history, allergies, medications, health status, including review of consultants reports, laboratory and other test data, was performed as part of comprehensive evaluation and provision of chronic care management services.   SDOH:  (Social Determinants of Health) assessments and interventions performed: none   CCM Care Plan  Allergies  Allergen Reactions   Naproxen Swelling and Anaphylaxis    Medications Reviewed Today     Reviewed by [name], RPH-CPP (  Pharmacist) on 09/11/20 at [place] List Status: <None>   Medication Order Taking? Sig Documenting Provider Last Dose Status Informant  acetaminophen (TYLENOL) 325 MG tablet [id number]  Take by mouth. [provider]  Active   amLODipine (NORVASC) 5 MG tablet [id number] Yes Take 1 tablet (5 mg total) by mouth daily. [name], DO Taking Active   aspirin 81 MG chewable tablet [id number]  Chew by mouth. [provider]  Active   atorvastatin (LIPITOR) 40 MG tablet [id number]  Take 1 tablet (40 mg total) by mouth daily. Karamalegos, [name], DO  Active   busPIRone (BUSPAR) 5 MG tablet [id number] Yes Take 1 tablet (5 mg total) by  mouth 2 (two) times daily as needed (anxiety). [name], DO Taking Active   escitalopram (LEXAPRO) 20 MG tablet [id number]  Take 1 tablet (20 mg total) by mouth at bedtime. Karamalegos, [name], DO  Active   folic acid (FOLVITE) 1 MG tablet [id number]  Take 1 mg by mouth See admin instructions. Take 1 mg daily except skip dose on Wednesdays (methotrexate day) [provider]  Active Self  furosemide (LASIX) 40 MG tablet [id number] Yes Take 1 tablet (40 mg total) by mouth daily as needed. [name], DO Taking Active   gabapentin (NEURONTIN) 100 MG capsule [id number]  TAKE 1 CAPSULE BY MOUTH ONCE DAILY INCREASE BY 1 CAPSULE EVERY 2-3 DAYS AS TOLERATED UPTO 3X A DAY OR 3 CAPSULESONCE IN EVENING Karamalegos, Alexander Lenna Sciara, DO  Active   glipiZIDE (GLUCOTROL) 10 MG tablet [id number] Yes Take 1 tablet (10 mg total) by mouth daily before breakfast. [name], DO Taking Active   Insulin Pen Needle 31G X 5 MM MISC [id number]  1 each by Does not apply route daily. Luciana Axe, NP  Active Self  lisinopril (ZESTRIL) 20 MG tablet [id number] Yes Take 1 tablet by mouth 2 (two) times daily. [provider] Taking Active   loratadine (CLARITIN) 10 MG tablet [id number]  Take 10 mg by mouth every morning. [provider]  Active Self  metFORMIN (GLUCOPHAGE) 500 MG tablet [id number] Yes Take 2 in morning with breakfast and take 1 tab in evening with dinner [name], DO Taking Active   methotrexate (RHEUMATREX) 2.5 MG tablet [id number]  Take 7.5 mg by mouth every Wednesday. [provider]  Active Self  metoprolol tartrate (LOPRESSOR) 25 MG tablet [id number] Yes Take 1 tablet (25 mg total) by mouth 2 (two) times daily. [name], DO Taking Active   nitroGLYCERIN (NITROSTAT) 0.4 MG SL tablet [id number]  Place under the tongue. [provider]  Active   tamsulosin (FLOMAX) 0.4 MG CAPS capsule [id number]  Take 1  capsule (0.4 mg total) by mouth daily. [name], DO  Active   TRULICITY 1.5 AQ/7.6AU [name] [id number] Yes INJECT 1.5MG INTO SKIN ONCE WEEKLY  Patient taking differently: Inject 1.5 mg as directed every Wednesday.   [name], DO Taking Active             Patient Active Problem List   Diagnosis Date Noted   Paroxysmal atrial fibrillation ([place]) 02/10/2020   S/P CABG x 4 12/08/2019   Post-op pain 11/27/2019   Postoperative nausea 11/27/2019   Moderate non-proliferative diabetic retinopathy ([place]) 10/08/2019   History of MI (myocardial infarction) 08/10/2019   Bilateral osteoarthritis of finger 07/17/2018   Obesity (BMI 30.0-34.9) 12/31/2017   Coronary artery disease involving native coronary artery of native heart without angina pectoris  03/09/2015   Essential hypertension 09/29/2014   Type 2 diabetes mellitus with other specified complication ([place]) 25/00/3704   Major depression in remission ([place]) 09/29/2014   Hyperlipidemia associated with type 2 diabetes mellitus ([place]) 09/29/2014   Edema of both lower extremities due to peripheral venous insufficiency 09/29/2014   Anxiety 09/20/2014   Primary osteoarthritis of knee 08/30/2014    Immunization History  Administered Date(s) Administered   Fluad Quad(high Dose 65+) 10/28/2018   Influenza, High Dose Seasonal PF 11/30/2019   PFIZER(Purple Top)SARS-COV-2 Vaccination 03/26/2019, 04/16/2019   Pneumococcal Conjugate-13 12/10/2016   Pneumococcal Polysaccharide-23 12/31/2017    Conditions to be addressed/monitored: CAD, HTN, HLD, and DMII  Care Plan : PharmD - Med Assistance/Management  Updates made by [name], RPH-CPP since 09/25/2020 12:00 AM     Problem: Disease Progression      Long-Range Goal: Disease Progression Prevented or Minimized   Start Date: 03/27/2020  Expected End Date: 06/25/2020  This Visit's Progress: On track  Recent Progress: On track  Priority: High  Note:   Current  Barriers:  Unable to independently afford treatment regimen Patient APPROVED for Trulicity from [facility] Patient Assistance Program 05/03/20-02/03/21  Pharmacist Clinical Goal(s):  Over the next 90 days, patient will verbalize ability to afford treatment regimen through collaboration with PharmD and provider.   Interventions: 1:1 collaboration with [name], DO regarding development and update of comprehensive plan of care as evidenced by provider attestation and co-signature Inter-disciplinary care team collaboration (see longitudinal plan of care)  Hypertension: Current treatment: Amlodipine 5 mg daily Furosemide 40 mg daily Lisinopril 20 mg twice daily (dose increased by Cardiologist on 8/3) Metoprolol tartrate 25 mg BID Reports monitoring home BP using upper arm monitor Reports has been monitoring home BP and keeping a log of results, but unable to review specific readings now as patient not currently home Recalls reading from yesterday evening: ~160/88 Reports recent systolic BP readings have been consistently >140 Discuss BP monitoring technique. Patient confirms has been resting prior to readings and avoiding caffeinated beverages at least 30 minutes prior to readings Advise patient to call Cardiologist office today to let provider know home BP continues to be elevated even since increased dose of lisinopril and provide specific recent BP readings. Patient confirms having phone number for Cardiologist and states that he will make this call Discuss impact of salt/sodium on blood pressure Patient denies adding salt to his food Encourage patient to review nutrition labels for sodium content Patient states that elevated BP may be related to recent increased stress in his life, including major renovations at [employer] patient to continue to monitor home BP, keep log of results including HR readings and bring this record to medical appointments Will send message to  PCP  T2DM: Controlled; current treatment: Metformin IR 511m x 2 in AM / x 1 in PM Trulicity 1.5 mg weekly Glipizide 10 mg daily before breakfast Unable to review readings today as patient not currently home, but reports recent results have been good Patient denies any recent low blood sugars Have counseled on diabetes medication management and reiterate recommendation to increase Trulicity dose to 338mweekly with plan to reduce or stop glipizide Patient verbalizes understanding, but expresses current preference to wait on making this adjustment  Hyperlipidemia: Controlled; current treatment: atorvastatin 40 mg daily   Patient Goals/Self-Care Activities Over the next 90 days, patient will:  - check glucose, document, and provide at future appointments - check blood pressure, document, and provide at future  appointments - collaborate with provider on medication access solutions - attend medical appointments as scheduled  Echocardiogram scheduled for 8/29 Appointment with Cardiologist on 9/22 Next appointment with PCP on 10/31  Follow Up Plan: Telephone follow up appointment with care management team member scheduled for: 9/26 at 12:30 pm      Patient's preferred pharmacy is:  [facility], [address]. [place] [address] Phone: [phone] Fax: [phone]  [facility] [address], [place] - [place] [address] Phone: [phone] Fax: [phone]  [facility] [place] to [address] [employer] [place] [address] Phone: [phone] Fax: [phone]   Follow Up:  Patient agrees to Care Plan and Follow-up.  Wallace Cullens, PharmD, Para [place], CPP Clinical Pharmacist [facility] [phone]

## 2020-09-25 NOTE — Patient Instructions (Signed)
Visit Information  PATIENT GOALS:  Goals Addressed             This Visit's Progress    Pharmacy Goals       Please call your Cardiologist today regarding your recent blood pressure readings.  Please continue to check your home blood pressure, keep a log of the results and bring this with you to medical appointments  Our goal bad cholesterol, or LDL, is less than 70 . This is why it is important to continue taking your atorvastatin  Feel free to call me with any questions or concerns. I look forward to our next call!  Wallace Cullens, PharmD, [place] [phone]        The patient verbalized understanding of instructions, educational materials, and care plan provided today and declined offer to receive copy of patient instructions, educational materials, and care plan.   Telephone follow up appointment with care management team member scheduled for: 9/26 at 12:30 pm

## 2020-10-02 DIAGNOSIS — I480 Paroxysmal atrial fibrillation: Secondary | ICD-10-CM | POA: Diagnosis not present

## 2020-10-04 DIAGNOSIS — I10 Essential (primary) hypertension: Secondary | ICD-10-CM | POA: Diagnosis not present

## 2020-10-04 DIAGNOSIS — E1169 Type 2 diabetes mellitus with other specified complication: Secondary | ICD-10-CM

## 2020-10-10 ENCOUNTER — Other Ambulatory Visit: Payer: Self-pay | Admitting: Family Medicine

## 2020-10-10 DIAGNOSIS — E1142 Type 2 diabetes mellitus with diabetic polyneuropathy: Secondary | ICD-10-CM

## 2020-10-10 NOTE — Telephone Encounter (Signed)
Requested Prescriptions  Pending Prescriptions Disp Refills  . gabapentin (NEURONTIN) 100 MG capsule [Pharmacy Med Name: GABAPENTIN 100 MG CAP] 90 capsule 2    Sig: TAKE 1 CAPSULE BY MOUTH ONCE DAILY INCREASE BY 1 CAPSULE EVERY 2-3 DAYS AS TOLERATED UPTO 3X A DAY OR 3 CAPSULESONCE IN EVENING     Neurology: Anticonvulsants - gabapentin Passed - 10/10/2020 10:06 AM      Passed - Valid encounter within last 12 months    Recent Outpatient Visits          1 month ago Type 2 diabetes mellitus with other specified complication, without long-term current use of insulin ([place])   [place], DO   5 months ago Diabetic polyneuropathy associated with type 2 diabetes mellitus [facility])   [facility] [name], DO   7 months ago Annual physical exam   [facility] [place], [name], DO   8 months ago BPH with obstruction/lower urinary tract symptoms   [place], DO   10 months ago Coronary artery disease involving native coronary artery of native heart without angina pectoris   [place], [name], DO      Future Appointments            In 1 month [place], [name], DO [facility], [facility]

## 2020-10-26 DIAGNOSIS — I872 Venous insufficiency (chronic) (peripheral): Secondary | ICD-10-CM | POA: Diagnosis not present

## 2020-10-26 DIAGNOSIS — I480 Paroxysmal atrial fibrillation: Secondary | ICD-10-CM | POA: Diagnosis not present

## 2020-10-26 DIAGNOSIS — E785 Hyperlipidemia, unspecified: Secondary | ICD-10-CM | POA: Diagnosis not present

## 2020-10-26 DIAGNOSIS — E118 Type 2 diabetes mellitus with unspecified complications: Secondary | ICD-10-CM | POA: Diagnosis not present

## 2020-10-26 DIAGNOSIS — I2510 Atherosclerotic heart disease of native coronary artery without angina pectoris: Secondary | ICD-10-CM | POA: Diagnosis not present

## 2020-10-26 DIAGNOSIS — I10 Essential (primary) hypertension: Secondary | ICD-10-CM | POA: Diagnosis not present

## 2020-10-26 DIAGNOSIS — Z951 Presence of aortocoronary bypass graft: Secondary | ICD-10-CM | POA: Diagnosis not present

## 2020-10-26 DIAGNOSIS — E669 Obesity, unspecified: Secondary | ICD-10-CM | POA: Diagnosis not present

## 2020-10-26 DIAGNOSIS — E1169 Type 2 diabetes mellitus with other specified complication: Secondary | ICD-10-CM | POA: Diagnosis not present

## 2020-10-26 DIAGNOSIS — E7849 Other hyperlipidemia: Secondary | ICD-10-CM | POA: Diagnosis not present

## 2020-10-27 ENCOUNTER — Other Ambulatory Visit: Payer: Self-pay

## 2020-10-27 ENCOUNTER — Other Ambulatory Visit: Payer: Self-pay | Admitting: Podiatry

## 2020-10-27 ENCOUNTER — Ambulatory Visit: Payer: PPO | Admitting: Podiatry

## 2020-10-27 ENCOUNTER — Ambulatory Visit (INDEPENDENT_AMBULATORY_CARE_PROVIDER_SITE_OTHER): Payer: PPO

## 2020-10-27 DIAGNOSIS — L97512 Non-pressure chronic ulcer of other part of right foot with fat layer exposed: Secondary | ICD-10-CM | POA: Diagnosis not present

## 2020-10-27 DIAGNOSIS — L97511 Non-pressure chronic ulcer of other part of right foot limited to breakdown of skin: Secondary | ICD-10-CM

## 2020-10-27 DIAGNOSIS — M2041 Other hammer toe(s) (acquired), right foot: Secondary | ICD-10-CM | POA: Diagnosis not present

## 2020-10-27 NOTE — Progress Notes (Signed)
   HPI: 70 y.o. male presenting today for follow-up evaluation regarding pain and tenderness to the third toe of the right foot.  Patient has history of right second toe amputation.  Earlier this year he had a flexor tenotomy of the right third toe to try and help alleviate pressure from the distal tip of the toe.  He states that he continues to have pain and pressure to this area with recurrence of ulceration.  He presents for further treatment and evaluation  Past Medical History:  Diagnosis Date   Anxiety    Arthritis    Coronary artery disease    Myocardial infarction [facility])    Postoperative nausea 11/27/2019     Physical Exam: General: The patient is alert and oriented x3 in no acute distress.  Dermatology: Ulcer noted distal tip of the right third toe with callus formation.  There is not appear to be any exposed bone however it is deep to the deep subcutaneous tissue of the distal tip of the toe.  Associated tenderness to palpation.  Ulcer measures approximately 0.3 x 0.3 x 0.2 cm.  To the noted ulceration there is no eschar.  There is a minimal amount of slough fibrin and necrotic tissue noted.  Minimal serous drainage.  There is no exposed bone.  Vascular: Palpable pedal pulses bilaterally. No edema or erythema noted. Capillary refill within normal limits.  Neurological: Epicritic and protective threshold grossly intact bilaterally.   Musculoskeletal Exam: History of prior right second toe amputation.  Hallux valgus deformity.  Hammertoe contracture noted to the right third toe which is contributory to the distal tuft ulcer development  Radiographic Exam:  Severe hallux valgus deformity noted with degenerative changes about the first MTP joint.  Second toe amputation at the level of the MTPJ.  Hammertoe contracture digits 3, 4, 5 of the right foot as well.  Assessment: 1. H/o right second toe amputation 2.  Hallux valgus deformity right; asymptomatic 3.  Hammertoe right third  digit with ulcer distal tuft    Plan of Care:  1. Patient evaluated. X-Rays reviewed.  Medically necessary excisional debridement including subcutaneous tissue was performed using a tissue nipper.  Excisional debridement of all the necrotic nonviable tissue down to healthy bleeding viable tissue was performed with postdebridement measurement same as pre- 2. Today we discussed the conservative versus surgical management of the presenting pathology. The patient opts for surgical management. All possible complications and details of the procedure were explained. All patient questions were answered. No guarantees were expressed or implied. 3. Authorization for surgery was initiated today. Surgery will consist of partial toe amputation right third digit.  The patient has a history of left third digit amputation which healed nicely.  He would like to proceed with this plan of care 4.  Return to clinic 1 week postop       [name], DPM [facility]  Dr. [name], DPM    [address], [place] [address]                Office [phone]  Fax [phone]

## 2020-10-30 ENCOUNTER — Ambulatory Visit (INDEPENDENT_AMBULATORY_CARE_PROVIDER_SITE_OTHER): Payer: PPO | Admitting: Pharmacist

## 2020-10-30 DIAGNOSIS — I10 Essential (primary) hypertension: Secondary | ICD-10-CM

## 2020-10-30 DIAGNOSIS — E1169 Type 2 diabetes mellitus with other specified complication: Secondary | ICD-10-CM

## 2020-10-30 NOTE — Patient Instructions (Signed)
Visit Information  PATIENT GOALS:  Goals Addressed             This Visit's Progress    Pharmacy Goals       Our goal bad cholesterol, or LDL, is less than 70 . This is why it is important to continue taking your atorvastatin   Please check your home blood pressure, keep a log of the results and have this to review during our next appointment.   Feel free to call me with any questions or concerns. I look forward to our next call!  Wallace Cullens, PharmD, [place] [phone]        The patient verbalized understanding of instructions, educational materials, and care plan provided today and declined offer to receive copy of patient instructions, educational materials, and care plan.   Telephone follow up appointment with care management team member scheduled for: 12/11/2020 at 12:30 PM

## 2020-10-30 NOTE — Chronic Care Management (AMB) (Signed)
Chronic Care Management Pharmacy Note  10/30/2020 Name:  Kevin Perry MRN:  [id number] DOB:  1950-02-28  Subjective: Kevin Perry is an 70 y.o. year old male who is a primary patient of Kevin Hauser, Kevin Perry.  The CCM team was consulted for assistance with disease management and care coordination needs.    Engaged with patient by telephone for follow up visit in response to provider referral for pharmacy case management and/or care coordination services.   Consent to Services:  The patient was given information about Chronic Care Management services, agreed to services, and gave verbal consent prior to initiation of services.  Please see initial visit note for detailed documentation.   Patient Care Team: Kevin Hauser, Kevin Perry as PCP - General (Family Medicine) Kevin Perry Kevin Perry, Kevin Perry (Pharmacist)  Recent consult visits: Office Visit with [facility]iology on 9/22 Office Visit with [facility] [place] at [facility] on 9/23  Hospital visits: None in previous 6 months  Objective:  Lab Results  Component Value Date   CREATININE 0.97 03/02/2020   CREATININE 0.82 02/11/2019   CREATININE 0.73 01/01/2019    Lab Results  Component Value Date   HGBA1C 6.2 (A) 08/30/2020   Last diabetic Eye exam:  Lab Results  Component Value Date/Time   HMDIABEYEEXA Retinopathy (A) 11/04/2018 12:00 AM    Last diabetic Foot exam: No results found for: HMDIABFOOTEX      Component Value Date/Time   CHOL 124 03/02/2020 1130   CHOL 153 07/27/2012 1335   TRIG 193 (H) 03/02/2020 1130   TRIG 212 (H) 07/27/2012 1335   HDL 44 03/02/2020 1130   HDL 34 (L) 07/27/2012 1335   CHOLHDL 2.8 03/02/2020 1130   VLDL 29 09/19/2015 1335   VLDL 42 (H) 07/27/2012 1335   LDLCALC 54 03/02/2020 1130   LDLCALC 77 07/27/2012 1335    Hepatic Function Latest Ref Rng & Units 03/02/2020 02/11/2019 01/01/2019  Total Protein 6.1 - 8.1 g/dL 6.6 6.5 7.3  Albumin 3.5 - 5.0  g/dL - - 3.5  AST 10 - 35 U/L 18 43(H) 28  ALT 9 - 46 U/L 20 53(H) 34  Alk Phosphatase 38 - 126 U/L - - 46  Total Bilirubin 0.2 - 1.2 mg/dL 0.6 0.9 1.2   Clinical ASCVD: Yes  The ASCVD Risk score (Arnett DK, et al., 2019) failed to calculate for the following reasons:   The patient has a prior MI or stroke diagnosis     Social History   Tobacco Use  Smoking Status Former   Types: Cigarettes   Quit date: 08/16/2004   Years since quitting: 16.2  Smokeless Tobacco Former  Tobacco Comments   quit 20 plus years   BP Readings from Last 3 Encounters:  08/30/20 (!) 158/80  05/05/20 123/74  03/02/20 (!) 148/76   Pulse Readings from Last 3 Encounters:  08/30/20 (!) 56  05/05/20 67  03/02/20 61   Wt Readings from Last 3 Encounters:  08/30/20 272 lb (123.4 kg)  05/05/20 270 lb 6.4 oz (122.7 kg)  03/02/20 268 lb (121.6 kg)    Assessment: Review of patient past medical history, allergies, medications, health status, including review of consultants reports, laboratory and other test data, was performed as part of comprehensive evaluation and provision of chronic care management services.   SDOH:  (Social Determinants of Health) assessments and interventions performed: none   CCM Care Plan  Allergies  Allergen Reactions   Naproxen Swelling and Anaphylaxis  Medications     Reviewed by Kevin Hook, Kevin Perry (Licensed Practical Nurse) on 10/27/20 at 1252  Med List Status: <None>   Medication Order Taking? Sig Documenting Provider Last Dose Status Informant  acetaminophen (TYLENOL) 325 MG tablet [id number] No Take by mouth. [provider] Taking Active   amLODipine (NORVASC) 5 MG tablet [id number] No Take 1 tablet (5 mg total) by mouth daily. Kevin Hauser, Kevin Perry Taking Active   aspirin 81 MG chewable tablet [id number] No Chew by mouth. [provider] Taking Active   atorvastatin (LIPITOR) 40 MG tablet [id number] No Take 1 tablet (40 mg total) by mouth  daily. Kevin Hauser, Kevin Perry Taking Active   busPIRone (BUSPAR) 5 MG tablet [id number] No Take 1 tablet (5 mg total) by mouth 2 (two) times daily as needed (anxiety). Kevin Hauser, Kevin Perry Taking Active   escitalopram (LEXAPRO) 20 MG tablet [id number] No Take 1 tablet (20 mg total) by mouth at bedtime. Kevin Hauser, Kevin Perry Taking Active   folic acid (FOLVITE) 1 MG tablet [id number] No Take 1 mg by mouth See admin instructions. Take 1 mg daily except skip dose on Wednesdays (methotrexate day) [provider] Taking Active Self  furosemide (LASIX) 40 MG tablet [id number] No Take 1 tablet (40 mg total) by mouth daily as needed. Kevin Hauser, Kevin Perry Taking Active   gabapentin (NEURONTIN) 100 MG capsule [id number]  TAKE 1 CAPSULE BY MOUTH ONCE DAILY INCREASE BY 1 CAPSULE EVERY 2-3 DAYS AS TOLERATED UPTO 3X A DAY OR 3 CAPSULESONCE IN EVENING Kevin Perry, Kevin Perry, Kevin Perry  Active   glipiZIDE (GLUCOTROL) 10 MG tablet [id number] No Take 1 tablet (10 mg total) by mouth daily before breakfast. Kevin Hauser, Kevin Perry Taking Active   Insulin Pen Needle 31G X 5 MM MISC [id number] No 1 each by Does not apply route daily. Kevin Axe, NP Taking Active Self  lisinopril (ZESTRIL) 20 MG tablet [id number] No Take 1 tablet by mouth 2 (two) times daily. [provider] Taking Active   loratadine (CLARITIN) 10 MG tablet [id number] No Take 10 mg by mouth every morning. [provider] Taking Active Self  metFORMIN (GLUCOPHAGE) 500 MG tablet [id number] No Take 2 in morning with breakfast and take 1 tab in evening with dinner Kevin Hauser, Kevin Perry Taking Active   methotrexate (RHEUMATREX) 2.5 MG tablet [id number] No Take 7.5 mg by mouth every Wednesday. [provider] Taking Active Self  metoprolol tartrate (LOPRESSOR) 25 MG tablet [id number] No Take 1 tablet (25 mg total) by mouth 2 (two) times daily. Kevin Hauser, Kevin Perry Taking Active    nitroGLYCERIN (NITROSTAT) 0.4 MG SL tablet [id number] No Place under the tongue. [provider] Taking Active   tamsulosin (FLOMAX) 0.4 MG CAPS capsule [id number] No Take 1 capsule (0.4 mg total) by mouth daily. Kevin Hauser, Kevin Perry Taking Active   TRULICITY 1.5 BF/3.8VA SOPN [id number] No INJECT 1.5MG INTO SKIN ONCE WEEKLY  Patient taking differently: Inject 1.5 mg as directed every Wednesday.   Kevin Hauser, Kevin Perry Taking Active             Patient Active Problem List   Diagnosis Date Noted   Paroxysmal atrial fibrillation ([place]) 02/10/2020   S/P CABG x 4 12/08/2019   Post-op pain 11/27/2019   Postoperative nausea 11/27/2019   Moderate non-proliferative diabetic retinopathy ([place]) 10/08/2019   History of MI (myocardial infarction) 08/10/2019   Bilateral osteoarthritis of finger 07/17/2018   Obesity (BMI 30.0-34.9) 12/31/2017  Coronary artery disease involving native coronary artery of native heart without angina pectoris 03/09/2015   Essential hypertension 09/29/2014   Type 2 diabetes mellitus with other specified complication ([place]) 09/73/5329   Major depression in remission ([place]) 09/29/2014   Hyperlipidemia associated with type 2 diabetes mellitus ([place]) 09/29/2014   Edema of both lower extremities due to peripheral venous insufficiency 09/29/2014   Anxiety 09/20/2014   Primary osteoarthritis of knee 08/30/2014    Immunization History  Administered Date(s) Administered   Fluad Quad(high Dose 65+) 10/28/2018   Influenza, High Dose Seasonal PF 11/30/2019   PFIZER(Purple Top)SARS-COV-2 Vaccination 03/26/2019, 04/16/2019   Pneumococcal Conjugate-13 12/10/2016   Pneumococcal Polysaccharide-23 12/31/2017    Conditions to be addressed/monitored: CAD, HTN, HLD, and DMII  Care Plan : PharmD - Med Assistance/Management  Updates made by [name], Kevin Perry since 10/30/2020 12:00 AM     Problem: Disease Progression      Long-Range Goal: Disease  Progression Prevented or Minimized   Start Date: 03/27/2020  Expected End Date: 06/25/2020  This Visit's Progress: On track  Recent Progress: On track  Priority: High  Note:   Current Barriers:  Unable to independently afford treatment regimen Patient APPROVED for Trulicity from [facility] Patient Assistance Program 05/03/20-02/03/21  Pharmacist Clinical Goal(s):  Over the next 90 days, patient will verbalize ability to afford treatment regimen through collaboration with PharmD and provider.   Interventions: 1:1 collaboration with Kevin Hauser, Kevin Perry regarding development and update of comprehensive plan of care as evidenced by provider attestation and co-signature Inter-disciplinary care team collaboration (see longitudinal plan of care) Perform chart review Office Visit with [facility] Cardiology on 9/22 Office Visit with [facility] [place] at [facility] on 9/23 Today reports planning to have toe surgery on 10/27  Hypertension: Current treatment: Amlodipine 5 mg daily Furosemide 40 mg daily Lisinopril 20 mg twice daily (dose increased by Cardiologist on 8/3) Metoprolol tartrate 25 mg BID Reports monitoring home BP using upper arm monitor Reports readings improved; recent readings ranging 125-130/80s, HR ~55 Have counseled on impact of salt/sodium on blood pressure Counsel patient to continue to monitor home BP, keep log of results including HR readings and bring this record to medical appointments  T2DM: Controlled; current treatment: Metformin IR 578m x 2 in AM / x 1 in PM Trulicity 1.5 mg weekly Glipizide 10 mg daily before breakfast Reports recent morning fasting readings ranging: 130-142 Patient denies any recent low blood sugars Encourage to have regular well-balanced meals throughout the day and limit carbohydrate portion sizes Exercise: reports currently limited due to toe/foot pain States hoping for improvement in pain following upcoming toe surgery  on 10/27 Discuss diabetes medication management and reiterate recommendation to increase Trulicity dose to 310mweekly with plan to reduce or stop glipizide Patient verbalizes understanding. States prefers to wait on making this adjustment until after toe surgery Patient agreeable to discuss with PCP at upcoming appointment  Hyperlipidemia: Controlled; current treatment: atorvastatin 40 mg daily   Patient Goals/Self-Care Activities Over the next 90 days, patient will:  - check glucose, document, and provide at future appointments - check blood pressure, document, and provide at future appointments - collaborate with provider on medication access solutions - attend medical appointments as scheduled Next appointment with PCP on 10/31  Follow Up Plan: Telephone follow up appointment with care management team member scheduled for: 12/11/2020 at 12:30 PM      Patient[facility] pharmacy [facility]:  [facility]  [address] [place] [address] Phone: [phone] Fax: [phone]  [facility] [address], [place] - [place] [address] Phone: [phone] Fax: [phone]  [facility] [place] [place] to Registered [facility] Sites [place] [address] Phone: [phone] Fax: [phone]   Follow Up:  Patient agrees to Care Plan and Follow-up.  Wallace Cullens, PharmD, [place], CPP Clinical Pharmacist [facility] [phone]

## 2020-11-02 ENCOUNTER — Telehealth: Payer: Self-pay | Admitting: Urology

## 2020-11-02 NOTE — Telephone Encounter (Signed)
DOS - 11/30/20  AMPUTATION TOE IPJ 3RD RIGHT --- 32761  HEALTH TEAM ADVANTAGE EFFECTIVE DATE - 02/05/20   RECEIVED FAX FROM [facility] STATING THAT CPT CODE 47092 HAS BEEN APPROVED, [place] # [id number], GOOD FROM 11/30/20 - 02/28/21.

## 2020-11-03 ENCOUNTER — Telehealth: Payer: Self-pay | Admitting: Family Medicine

## 2020-11-03 DIAGNOSIS — I10 Essential (primary) hypertension: Secondary | ICD-10-CM

## 2020-11-03 DIAGNOSIS — E1169 Type 2 diabetes mellitus with other specified complication: Secondary | ICD-10-CM

## 2020-11-03 DIAGNOSIS — E785 Hyperlipidemia, unspecified: Secondary | ICD-10-CM

## 2020-11-03 NOTE — Telephone Encounter (Signed)
Left message for patient to call back and schedule Medicare Annual Wellness Visit (AWV) to be done virtually or by telephone.  No hx of AWV eligible as of 05/06/19  Please schedule at anytime with [facility].      40 Minutes appointment   Any questions, please call me at [phone]

## 2020-11-05 LAB — WOUND CULTURE

## 2020-11-13 DIAGNOSIS — E113212 Type 2 diabetes mellitus with mild nonproliferative diabetic retinopathy with macular edema, left eye: Secondary | ICD-10-CM | POA: Diagnosis not present

## 2020-11-13 DIAGNOSIS — H02889 Meibomian gland dysfunction of unspecified eye, unspecified eyelid: Secondary | ICD-10-CM | POA: Diagnosis not present

## 2020-11-13 DIAGNOSIS — H26493 Other secondary cataract, bilateral: Secondary | ICD-10-CM | POA: Diagnosis not present

## 2020-11-14 NOTE — Progress Notes (Signed)
[facility] Note  11/15/2020     CHIEF COMPLAINT Patient presents for Retina Evaluation   HISTORY OF PRESENT ILLNESS: Kevin Perry is a 70 y.o. male who presents to the clinic today for:   HPI     Retina Evaluation   In left eye.  This started 7 months ago.  Context:  distance vision.  I, the attending physician,  performed the HPI with the patient and updated documentation appropriately.        Comments   Retina eval per Dr. Ellin Mayhew for DME OS- Decreased vision x6-7 months OS, worsening.   DM II x8-10 years.  BS 162 yesterday fasting, A1C 6.8 He is having a toe amputated 11/30/20.      Last edited by [name], MD on 11/15/2020  4:21 PM.    Pt has been noticing deteriorating VA OS x 6 mos.  Referred by Dr. Ellin Mayhew for eval of possible DME OS.  Pt has significant cardiovascular history with hx of heart attack 2008, s/p CABG Oct 2021, and HTN. Pt reports spike in BP ~3 mos ago  Referring physician: [name], [place],  [place] [address]  HISTORICAL INFORMATION:   Selected notes from the MEDICAL RECORD NUMBER Referred by Dr. [name] for decreased vision and macular edema OS   CURRENT MEDICATIONS: No current outpatient medications on file. (Ophthalmic Drugs)   No current facility-administered medications for this visit. (Ophthalmic Drugs)   Current Outpatient Medications (Other)  Medication Sig   amLODipine (NORVASC) 5 MG tablet Take 1 tablet (5 mg total) by mouth daily.   aspirin 81 MG chewable tablet Chew by mouth.   atorvastatin (LIPITOR) 40 MG tablet Take 1 tablet (40 mg total) by mouth daily.   escitalopram (LEXAPRO) 20 MG tablet Take 1 tablet (20 mg total) by mouth at bedtime.   folic acid (FOLVITE) 1 MG tablet Take 1 mg by mouth See admin instructions. Take 1 mg daily except skip dose on Wednesdays (methotrexate day)   furosemide (LASIX) 40 MG tablet Take 1 tablet (40 mg total) by mouth daily as  needed.   gabapentin (NEURONTIN) 100 MG capsule TAKE 1 CAPSULE BY MOUTH ONCE DAILY INCREASE BY 1 CAPSULE EVERY 2-3 DAYS AS TOLERATED UPTO 3X A DAY OR 3 CAPSULESONCE IN EVENING   glipiZIDE (GLUCOTROL) 10 MG tablet Take 1 tablet (10 mg total) by mouth daily before breakfast.   lisinopril (ZESTRIL) 20 MG tablet Take 1 tablet by mouth 2 (two) times daily.   loratadine (CLARITIN) 10 MG tablet Take 10 mg by mouth every morning.   metFORMIN (GLUCOPHAGE) 500 MG tablet Take 2 in morning with breakfast and take 1 tab in evening with dinner   methotrexate (RHEUMATREX) 2.5 MG tablet Take 7.5 mg by mouth every Wednesday.   metoprolol tartrate (LOPRESSOR) 25 MG tablet Take 1 tablet (25 mg total) by mouth 2 (two) times daily.   nitroGLYCERIN (NITROSTAT) 0.4 MG SL tablet Place under the tongue.   tamsulosin (FLOMAX) 0.4 MG CAPS capsule Take 1 capsule (0.4 mg total) by mouth daily.   TRULICITY 1.5 IP/3.8SN SOPN INJECT 1.5MG INTO SKIN ONCE WEEKLY (Patient taking differently: Inject 1.5 mg as directed every Wednesday.)   acetaminophen (TYLENOL) 325 MG tablet Take by mouth. (Patient not taking: Reported on 11/15/2020)   busPIRone (BUSPAR) 5 MG tablet Take 1 tablet (5 mg total) by mouth 2 (two) times daily as needed (anxiety). (Patient not taking: Reported on  11/15/2020)   Insulin Pen Needle 31G X 5 MM MISC 1 each by Does not apply route daily.   No current facility-administered medications for this visit. (Other)   REVIEW OF SYSTEMS: ROS   Positive for: Endocrine, Cardiovascular, Eyes, Psychiatric Negative for: Constitutional, Gastrointestinal, Neurological, Skin, Genitourinary, Musculoskeletal, HENT, Respiratory, Allergic/Imm, Heme/Lymph Last edited by Leonie Nivan, COA on 11/15/2020  1:52 PM.     ALLERGIES Allergies  Allergen Reactions   Naproxen Swelling and Anaphylaxis   PAST MEDICAL HISTORY Past Medical History:  Diagnosis Date   Anxiety    Arthritis    Coronary artery disease    Myocardial  infarction [facility])    Postoperative nausea 11/27/2019   Past Surgical History:  Procedure Laterality Date   CHOLECYSTECTOMY     CORONARY ANGIOPLASTY WITH STENT PLACEMENT N/A 2008   FOOT SURGERY Left    DOS 8.1.14 HALLIX IPJ FUSION, SECOND MET OSTEOTOMY W/SCREW, HAM TOE REPAIR 2,,4 , PARTIAL AMP 3RD DIGIT    GALLBLADDER SURGERY     KNEE SURERY Right    LEFT HEART CATH AND CORONARY ANGIOGRAPHY Left 11/17/2019   Procedure: LEFT HEART CATH AND CORONARY ANGIOGRAPHY;  Surgeon: [name], MD;  Location: [facility];  Service: Cardiovascular;  Laterality: Left;   TOTAL KNEE ARTHROPLASTY Right 08/30/2014   Procedure: TOTAL KNEE ARTHROPLASTY;  Surgeon: [name], MD;  Location: [facility];  Service: Orthopedics;  Laterality: Right;   FAMILY HISTORY History reviewed. No pertinent family history.  SOCIAL HISTORY Social History   Tobacco Use   Smoking status: Former    Types: Cigarettes    Quit date: 08/16/2004    Years since quitting: 16.2   Smokeless tobacco: Former   Tobacco comments:    quit 20 plus years  Vaping Use   Vaping Use: Never used  Substance Use Topics   Alcohol use: Yes    Alcohol/week: 2.0 standard drinks    Types: 2 Cans of beer per week    Comment: SOCIAL   Drug use: No       OPHTHALMIC EXAM:  Base Eye Exam     Visual Acuity (Snellen - Linear)       Right Left   Dist cc 20/25 20/80 +1   Dist ph cc 20/20 NI    Correction: Glasses  OS- Can only see the first 2 letters of each line        Tonometry (Tonopen, 2:05 PM)       Right Left   Pressure 13 12         Pupils       Dark Light Shape React APD   Right 3 2 Round Brisk None   Left 3 2 Round Brisk None         Visual Fields (Counting fingers)       Left Right    Full Full         Extraocular Movement       Right Left    Full Full         Neuro/Psych     Oriented x3: Yes   Mood/Affect: Normal         Dilation     Both eyes: 1.0% Mydriacyl, 2.5%  Phenylephrine @ 2:05 PM           Slit Lamp and Fundus Exam     Slit Lamp Exam       Right Left   Lids/Lashes Dermato, mild MGD, telangiectasia Dermato, mild MGD, telangiectasia  Conjunctiva/Sclera White and quiet White and quiet   Cornea Arcus, trace PEE Arcus, trace PEE   Anterior Chamber Deep and quiet Deep and quiet   Iris Round and dilated, no NVI, TID @ 0300 limbus--patent LPI Round and dilated, no NVI, TID @ 0900 limbus--patent LPI   Lens PCIOL in good position PCIOL in good position   Vitreous Synerisis Synerisis         Fundus Exam       Right Left   Disc Mild pallor, sharp rim Pink, sharp, +fine vascular loops   C/D Ratio 0.4 0.6   Macula Flat, blunted foveal reflex, RPE mottling, no heme or edema Blunted foveal reflex, scattered IRH/DBH greatest temporal to fovea, +edema, mild ERM w/central lamellar hole   Vessels Attenuated arterioles, +copper wiring, tortuous Attenuated, tortuous, +AV crossing changs, dilated venules   Periphery Attached, no heme Attached, 360 MA/DBH           Refraction     Wearing Rx       Sphere Cylinder Axis Add   Right -1.00 +2.25 005 +2.25   Left -1.50 +1.50 170 +2.25         Manifest Refraction       Sphere Cylinder Axis Dist VA   Right -1.25 +2.50 005    Left -1.00 +1.50 170 NI           IMAGING AND PROCEDURES  Imaging and Procedures for 11/15/2020  OCT, Retina - OU - Both Eyes       Right Eye Quality was good. Central Foveal Thickness: 242. Progression has no prior data. Findings include normal foveal contour, no IRF, no SRF (Partial PVD).   Left Eye Quality was good. Central Foveal Thickness: 358. Progression has no prior data. Findings include abnormal foveal contour, intraretinal fluid, no SRF, lamellar hole, outer retinal atrophy, intraretinal hyper-reflective material, epiretinal membrane (ERM w/lamellar hole; central atrophy, temporal cystic changes/edema).   Notes *Images captured and stored on  drive  Diagnosis / Impression:  OD: NFP, No IRF/SRF, Partial PVD OS: ERM w/lamellar hole; central atrophy, temporal cystic changes/edema (CRVO w/ CME)  Clinical management:  See below  Abbreviations: NFP - Normal foveal profile. CME - cystoid macular edema. PED - pigment epithelial detachment. IRF - intraretinal fluid. SRF - subretinal fluid. EZ - ellipsoid zone. ERM - epiretinal membrane. ORA - outer retinal atrophy. ORT - outer retinal tubulation. SRHM - subretinal hyper-reflective material. IRHM - intraretinal hyper-reflective material      Fluorescein Angiography Optos (Transit OS)       Right Eye Progression has no prior data. Early phase findings include normal observations (No MA). Mid/Late phase findings include normal observations (No MA).   Left Eye Progression has no prior data. Early phase findings include delayed filling, microaneurysm. Mid/Late phase findings include microaneurysm, leakage (Cluster of perivascular, perifoveal leakage temporal macula--? Partial petaloid pattern).   Notes **Images stored on drive**  Impression: OD: Normal study. No MA OS: Remote RVO w/ CME.  Cluster of perivascular, perifoveal leakage temporal macula--? Partial petaloid pattern.     Intravitreal Injection, Pharmacologic Agent - OS - Left Eye       Time Out 11/15/2020. 3:43 PM. Confirmed correct patient, procedure, site, and patient consented.   Anesthesia Topical anesthesia was used. Anesthetic medications included Proparacaine 0.5%, Lidocaine 2%.   Procedure Preparation included 5% betadine to ocular surface, eyelid speculum.   Injection: 1.25 mg Bevacizumab 1.26m/0.05ml   Route: Intravitreal, Site: Left Eye   NDC: 50242-060-01, Lot:  [id number]' , Expiration date: 12/27/2020, Waste: 0 mL   Post-op Post injection exam found visual acuity of at least counting fingers. The patient tolerated the procedure well. There were no complications. The patient received written and  verbal post procedure care education. Post injection medications were not given.            ASSESSMENT/PLAN:    ICD-10-CM   1. Central retinal vein occlusion with macular edema of left eye  H34.8120 Intravitreal Injection, Pharmacologic Agent - OS - Left Eye    Bevacizumab (AVASTIN) SOLN 1.25 mg    2. Retinal edema  H35.81 OCT, Retina - OU - Both Eyes    3. Essential hypertension  I10     4. Hypertensive retinopathy of both eyes  H35.033 Fluorescein Angiography Optos (Transit OS)    5. Epiretinal membrane (ERM) of left eye  H35.372     6. Lamellar macular hole of left eye  H35.342     7. Diabetes mellitus type 2 without retinopathy ([place])  E11.9     8. Pseudophakia of both eyes  Z96.1     1,2. Remote CRVO w/ CME, OS  - pt reports progressive decline in vision OS, first noted ~6-7 mos ago  - waited for regularly scheduled f/u with Dr. [address] for evaluation  - pt w/ significant cardiovascular history -- MI in 2008, s/p CABG Oct 2021; pt reports spikes in BP ~3 mos ago (180s/110s)  - The natural history of retinal vein occlusion and macular edema and treatment options including observation, laser photocoagulation, and intravitreal antiVEGF injection with Avastin and Lucentis and Eylea and intravitreal injection of steroids with triamcinolone and Ozurdex and the complications of these procedures including loss of vision, infection, cataract, glaucoma, and retinal detachment were discussed with patient.  - Specifically discussed findings from [place] / [place] study regarding patient stabilization with anti-VEGF agents and increased potential for visual improvements.  Also discussed need for frequent follow up and potentially multiple injections given the chronic nature of the disease process             - OCT 10.12.22 shows ERM w/lamellar hole; central atrophy, temporal cystic changes/edema             - FA 10.12.22 shows remote RVO w/ CME.  Cluster of perivascular, perifoveal leakage  temporal macula--? Partial petaloid pattern  - recommend IVA OS #1 today, 10.12.22  - RBA of procedure discussed, questions answered - informed consent obtained and signed  - see procedure note - f/u 4 wks -- DFE/OCT, possible injection  3,4. Hypertensive retinopathy OU - discussed importance of tight BP control and relation to RVO - monitor   5,6. Epiretinal membrane w/ lamellar hole OS - The natural history, anatomy, potential for loss of vision, and treatment options including vitrectomy techniques and the complications of endophthalmitis, retinal detachment, vitreous hemorrhage, cataract progression and permanent vision loss discussed with the patient. - mild ERM w/ lamellar hole - BCVA 20/80+ -- limited mostly by remote CRVO w/ central atrophy - no indication for surgery at this time - monitor for now  7. Diabetes mellitus, type 2 without retinopathy  - A1c 6.2 on 7.27.22  - OS with scattered IRH/DBH -- mostly from CRVO  - OD w/o heme  - no DME OU - The incidence, risk factors for progression, natural history and treatment options for diabetic retinopathy  were discussed with patient.   - The need for close monitoring of blood glucose, blood pressure, and serum lipids, avoiding  cigarette or any type of tobacco, and the need for long term follow up was also discussed with patient. - monitor  8. Pseudophakia OU  - s/p CE/IOL OU  - IOL in good position, doing well  - monitor   Ophthalmic Meds Ordered this visit:  Meds ordered this encounter  Medications   Bevacizumab (AVASTIN) SOLN 1.25 mg     Return in about 4 weeks (around 12/13/2020) for 4 wk f/u for remote CRVO w/CME OS w/DFE/OCT/likely inj..  There are no Patient Instructions on file for this visit.  Explained the diagnoses, plan, and follow up with the patient and they expressed understanding.  Patient expressed understanding of the importance of proper follow up care.   This document serves as a record of services  personally performed by Gardiner Sleeper, MD, PhD. It was created on their behalf by Orvan Falconer, an ophthalmic technician. The creation of this record is the provider's dictation and/or activities during the visit.    Electronically signed by: Orvan Falconer, OA, 11/15/20  4:25 PM   This document serves as a record of services personally performed by Gardiner Sleeper, MD, PhD. It was created on their behalf by Estill Bakes, COT an ophthalmic technician. The creation of this record is the provider's dictation and/or activities during the visit.    Electronically signed by: Estill Bakes, [place] 10.12.22 @ 4:25 PM   Gardiner Sleeper, M.D., Ph.D. Diseases & Surgery of the Retina and Vitreous [facility] 10.12.22  I have reviewed the above documentation for accuracy and completeness, and I agree with the above. Gardiner Sleeper, M.D., Ph.D. 11/15/20 4:32 PM  Abbreviations: M myopia (nearsighted); A astigmatism; H hyperopia (farsighted); P presbyopia; Mrx spectacle prescription;  CTL contact lenses; OD right eye; OS left eye; OU both eyes  XT exotropia; ET esotropia; PEK punctate epithelial keratitis; PEE punctate epithelial erosions; DES dry eye syndrome; MGD meibomian gland dysfunction; ATs artificial tears; PFAT's preservative free artificial tears; [place] nuclear sclerotic cataract; PSC posterior subcapsular cataract; ERM epi-retinal membrane; PVD posterior vitreous detachment; RD retinal detachment; DM diabetes mellitus; DR diabetic retinopathy; NPDR non-proliferative diabetic retinopathy; PDR proliferative diabetic retinopathy; CSME clinically significant macular edema; DME diabetic macular edema; dbh dot blot hemorrhages; CWS cotton wool spot; POAG primary open angle glaucoma; C/D cup-to-disc ratio; HVF humphrey visual field; GVF goldmann visual field; OCT optical coherence tomography; IOP intraocular pressure; BRVO Branch retinal vein occlusion; CRVO central retinal vein  occlusion; CRAO central retinal artery occlusion; BRAO branch retinal artery occlusion; RT retinal tear; SB scleral buckle; PPV pars plana vitrectomy; VH Vitreous hemorrhage; PRP panretinal laser photocoagulation; IVK intravitreal kenalog; VMT vitreomacular traction; MH Macular hole;  NVD neovascularization of the disc; NVE neovascularization elsewhere; AREDS age related eye disease study; ARMD age related macular degeneration; POAG primary open angle glaucoma; EBMD epithelial/anterior basement membrane dystrophy; ACIOL anterior chamber intraocular lens; IOL intraocular lens; PCIOL posterior chamber intraocular lens; Phaco/IOL phacoemulsification with intraocular lens placement; [place] photorefractive keratectomy; LASIK laser assisted in situ keratomileusis; HTN hypertension; DM diabetes mellitus; COPD chronic obstructive pulmonary disease

## 2020-11-15 ENCOUNTER — Encounter (INDEPENDENT_AMBULATORY_CARE_PROVIDER_SITE_OTHER): Payer: Self-pay | Admitting: Ophthalmology

## 2020-11-15 ENCOUNTER — Other Ambulatory Visit: Payer: Self-pay

## 2020-11-15 ENCOUNTER — Ambulatory Visit (INDEPENDENT_AMBULATORY_CARE_PROVIDER_SITE_OTHER): Payer: PPO | Admitting: Ophthalmology

## 2020-11-15 DIAGNOSIS — H35372 Puckering of macula, left eye: Secondary | ICD-10-CM | POA: Diagnosis not present

## 2020-11-15 DIAGNOSIS — H35342 Macular cyst, hole, or pseudohole, left eye: Secondary | ICD-10-CM | POA: Diagnosis not present

## 2020-11-15 DIAGNOSIS — H35033 Hypertensive retinopathy, bilateral: Secondary | ICD-10-CM | POA: Diagnosis not present

## 2020-11-15 DIAGNOSIS — H3581 Retinal edema: Secondary | ICD-10-CM

## 2020-11-15 DIAGNOSIS — Z961 Presence of intraocular lens: Secondary | ICD-10-CM | POA: Diagnosis not present

## 2020-11-15 DIAGNOSIS — E119 Type 2 diabetes mellitus without complications: Secondary | ICD-10-CM | POA: Diagnosis not present

## 2020-11-15 DIAGNOSIS — H34812 Central retinal vein occlusion, left eye, with macular edema: Secondary | ICD-10-CM

## 2020-11-15 DIAGNOSIS — I10 Essential (primary) hypertension: Secondary | ICD-10-CM | POA: Diagnosis not present

## 2020-11-15 LAB — HM DIABETES EYE EXAM

## 2020-11-15 MED ORDER — BEVACIZUMAB CHEMO INJECTION 1.25MG/0.05ML SYRINGE FOR KALEIDOSCOPE
1.2500 mg | INTRAVITREAL | Status: AC | PRN
  Administered 2020-11-15: 1.25 mg via INTRAVITREAL

## 2020-11-23 DIAGNOSIS — M86671 Other chronic osteomyelitis, right ankle and foot: Secondary | ICD-10-CM | POA: Diagnosis not present

## 2020-11-30 ENCOUNTER — Other Ambulatory Visit: Payer: Self-pay | Admitting: Podiatry

## 2020-11-30 ENCOUNTER — Encounter: Payer: Self-pay | Admitting: Podiatry

## 2020-11-30 DIAGNOSIS — L97519 Non-pressure chronic ulcer of other part of right foot with unspecified severity: Secondary | ICD-10-CM | POA: Diagnosis not present

## 2020-11-30 DIAGNOSIS — I9751 Accidental puncture and laceration of a circulatory system organ or structure during a circulatory system procedure: Secondary | ICD-10-CM | POA: Diagnosis not present

## 2020-11-30 DIAGNOSIS — L97512 Non-pressure chronic ulcer of other part of right foot with fat layer exposed: Secondary | ICD-10-CM | POA: Diagnosis not present

## 2020-11-30 DIAGNOSIS — M86671 Other chronic osteomyelitis, right ankle and foot: Secondary | ICD-10-CM | POA: Diagnosis not present

## 2020-11-30 MED ORDER — DOXYCYCLINE HYCLATE 100 MG PO TABS: 100.0000 mg | ORAL_TABLET | Freq: Two times a day (BID) | ORAL | 0 refills | Status: DC

## 2020-11-30 MED ORDER — OXYCODONE-ACETAMINOPHEN 5-325 MG PO TABS: 1.0000 | ORAL_TABLET | ORAL | 0 refills | Status: DC | PRN

## 2020-11-30 NOTE — Progress Notes (Signed)
PRN postop 

## 2020-12-04 ENCOUNTER — Other Ambulatory Visit: Payer: Self-pay

## 2020-12-04 ENCOUNTER — Encounter: Payer: Self-pay | Admitting: Family Medicine

## 2020-12-04 ENCOUNTER — Ambulatory Visit (INDEPENDENT_AMBULATORY_CARE_PROVIDER_SITE_OTHER): Payer: PPO | Admitting: Family Medicine

## 2020-12-04 VITALS — BP 169/79 | HR 57 | Ht 75.0 in | Wt 268.2 lb

## 2020-12-04 DIAGNOSIS — Z23 Encounter for immunization: Secondary | ICD-10-CM

## 2020-12-04 DIAGNOSIS — E1169 Type 2 diabetes mellitus with other specified complication: Secondary | ICD-10-CM

## 2020-12-04 DIAGNOSIS — I10 Essential (primary) hypertension: Secondary | ICD-10-CM

## 2020-12-04 DIAGNOSIS — Z89421 Acquired absence of other right toe(s): Secondary | ICD-10-CM

## 2020-12-04 LAB — POCT GLYCOSYLATED HEMOGLOBIN (HGB A1C): Hemoglobin A1C: 6.4 % — AB (ref 4.0–5.6)

## 2020-12-04 MED ORDER — AMLODIPINE BESYLATE 10 MG PO TABS: 10.0000 mg | ORAL_TABLET | Freq: Every day | ORAL | 1 refills | Status: DC

## 2020-12-04 NOTE — Patient Instructions (Addendum)
Thank you for coming to the office today.  When ready, once Eye situation is under control.  Trial a higher dose Trulicity 1.5mg  dose x 2 (one on each side) = 3mg  weekly, can try this for 2 weeks to see if you tolerate it and notice any improvement.  HOLD Glipizide pill while you do this experiment. To avoid any low sugars.  If successful, we can always order you more - and change your Lilly application.  If preferred to keep everything as is, due to side effects we can keep everything same, and give you a sample Trulicity 1.5 to make up for the lost doses.  Dose increase Amlodipine from 5 to 10mg  Reduce Lisinopril 20 from twice a day to ONCE daily Keep Metoprolol same  DUE for FASTING BLOOD WORK (no food or drink after midnight before the lab appointment, only water or coffee without cream/sugar on the morning of)  SCHEDULE "Lab Only" visit in the morning at the clinic for lab draw in  3 MONTHS   - Make sure Lab Only appointment is at about 1 week before your next appointment, so that results will be available  For Lab Results, once available within 2-3 days of blood draw, you can can log in to MyChart online to view your results and a brief explanation. Also, we can discuss results at next follow-up visit.   Please schedule a Follow-up Appointment to: Return in about 3 months (around 03/06/2021) for 3 month fasting lab only then 1 week later Annual Physical.  If you have any other questions or concerns, please feel free to call the office or send a message through [place]. You may also schedule an earlier appointment if necessary.  Additionally, you may be receiving a survey about your experience at our office within a few days to 1 week by e-mail or mail. We value your feedback.  [name], DO [place]

## 2020-12-04 NOTE — Progress Notes (Signed)
Subjective:    Patient ID: Kevin Perry, male    DOB: 11-May-1950, 70 y.o.   MRN: [id number]  Kevin Perry is a 70 y.o. male presenting on 12/04/2020 for Diabetes   HPI  CHRONIC DM, Type 2: Doing well. On Lilly Cares PAP program for [employer] 1.5mg  Meds: Trulicity 1.5mg  weekly, Glipizide 10mg  daily, Metformin 2AM and 1 PM Reports good compliance. Tolerating well w/o side-effects Currently on ACEi Lifestyle: - Diet (trying to improve diet reduce carb intake) - Exercise (walk regularly) Denies hypoglycemia, polyuria, visual changes, numbness or tingling.   Central Retinal Vein Occlusion, Left w/ macular edema Referred from Dr Kevin Perry to Dr Kevin Perry for [place] specialist. To receive injections in eye, he has had reduced vision on Left eye. Due to blood pressure.  CHRONIC HTN: Cardiology doubled his lisinopril to 20mg  BID Current Meds - Amlodipine 5mg  daily, Furosemide 40mg  dailiy, Lisinopril 20mg  daily, Metoprolol XL 50mg  daily   Reports good compliance, took meds today. Tolerating well, w/o complaints. Denies CP, dyspnea, HA, edema, dizziness / lightheadedness   Hx R 2nd toe removal due to arthritis New R 3rd toe removal due to ulceration, per Dr Kevin Perry Podiatry   Anxiety Depression in remission Improved on med.   Depression screen [facility] 2/9 12/04/2020 08/30/2020 03/02/2020  Decreased Interest 0 0 0  Down, Depressed, Hopeless 0 0 0  PHQ - 2 Score 0 0 0  Altered sleeping - 0 0  Tired, decreased energy - 0 0  Change in appetite - 0 0  Feeling bad or failure about yourself  - 0 0  Trouble concentrating - 0 0  Moving slowly or fidgety/restless - 0 0  Suicidal thoughts - 0 0  PHQ-9 Score - 0 0  Difficult doing work/chores - Not difficult at all Not difficult at all  Some recent data might be hidden    Social History   Tobacco Use   Smoking status: Former    Types: Cigarettes    Quit date: 08/16/2004    Years since quitting: 16.3   Smokeless tobacco: Former    Tobacco comments:    quit 20 plus years  Vaping Use   Vaping Use: Never used  Substance Use Topics   Alcohol use: Yes    Alcohol/week: 2.0 standard drinks    Types: 2 Cans of beer per week    Comment: SOCIAL   Drug use: No    Review of Systems Per HPI unless specifically indicated above     Objective:    BP (!) 169/79   Pulse (!) 57   Ht 6\' 3"  (1.905 m)   Wt 268 lb 3.2 oz (121.7 kg)   SpO2 94%   BMI 33.52 kg/m   Wt Readings from Last 3 Encounters:  12/04/20 268 lb 3.2 oz (121.7 kg)  08/30/20 272 lb (123.4 kg)  05/05/20 270 lb 6.4 oz (122.7 kg)    Physical Exam Vitals and nursing note reviewed.  Constitutional:      General: He is not in acute distress.    Appearance: He is well-developed. He is not diaphoretic.     Comments: Well-appearing, comfortable, cooperative  HENT:     Head: Normocephalic and atraumatic.  Eyes:     General:        Right eye: No discharge.        Left eye: No discharge.     Conjunctiva/sclera: Conjunctivae normal.  Neck:     Thyroid: No thyromegaly.  Cardiovascular:  Rate and Rhythm: Normal rate and regular rhythm.     Pulses: Normal pulses.     Heart sounds: Normal heart sounds. No murmur heard. Pulmonary:     Effort: Pulmonary effort is normal. No respiratory distress.     Breath sounds: Normal breath sounds. No wheezing or rales.  Musculoskeletal:        General: Normal range of motion.     Cervical back: Normal range of motion and neck supple.     Comments: Right foot s/p 3rd toe complete ray amputation well healing.  Currently stable long history of R foot s/p 2nd toe complete ray amputation.  Lymphadenopathy:     Cervical: No cervical adenopathy.  Skin:    General: Skin is warm and dry.     Findings: No erythema or rash.  Neurological:     Mental Status: He is alert and oriented to person, place, and time. Mental status is at baseline.  Psychiatric:        Behavior: Behavior normal.     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 Followed by Podiatry  A1c controlled today  Trial a higher dose Trulicity 1.5mg  dose x 2 (one on each side) = 3mg  weekly, can try this for 2 weeks to see if you tolerate it and notice any improvement.  HOLD Glipizide pill while you do this experiment. To avoid any low sugars.  If successful, we can always order you more - and change your Lilly application.  If preferred to keep everything as is, due to side effects we can keep everything same, and give you a sample Trulicity 1.5 to make up for the lost doses.  Elevated BP still Dose increase Amlodipine from 5 to 10mg  Reduce Lisinopril 20 from twice a day to ONCE daily Keep Metoprolol same   Meds ordered this encounter  Medications   amLODipine (NORVASC) 10 MG tablet    Sig: Take 1 tablet (10 mg total) by mouth daily.    Dispense:  90 tablet    Refill:  1       Follow up plan: Return in about 3 months (around 03/06/2021) for 3 month fasting lab only then 1 week later Annual Physical.   Kevin Putnam, DO [facility] 12/04/2020, 11:09 AM

## 2020-12-05 ENCOUNTER — Encounter: Payer: Self-pay | Admitting: Podiatry

## 2020-12-08 ENCOUNTER — Other Ambulatory Visit: Payer: Self-pay

## 2020-12-08 ENCOUNTER — Ambulatory Visit (INDEPENDENT_AMBULATORY_CARE_PROVIDER_SITE_OTHER): Payer: PPO

## 2020-12-08 ENCOUNTER — Ambulatory Visit (INDEPENDENT_AMBULATORY_CARE_PROVIDER_SITE_OTHER): Payer: PPO | Admitting: Podiatry

## 2020-12-08 DIAGNOSIS — L97511 Non-pressure chronic ulcer of other part of right foot limited to breakdown of skin: Secondary | ICD-10-CM | POA: Diagnosis not present

## 2020-12-08 DIAGNOSIS — Z9889 Other specified postprocedural states: Secondary | ICD-10-CM

## 2020-12-08 NOTE — Progress Notes (Signed)
   Subjective:  Patient presents today status post right third partial toe amputation. DOS: 11/30/2020.  Patient states that he is feeling well.  He has no pain associated to the area.  He has kept the dressings clean dry and intact.  He has been weightbearing in the surgical shoe as instructed.  Past Medical History:  Diagnosis Date   Anxiety    Arthritis    Coronary artery disease    Myocardial infarction [facility])    Postoperative nausea 11/27/2019      Objective/Physical Exam Neurovascular status intact.  Skin incisions appear to be well coapted with sutures and staples intact. No sign of infectious process noted. No dehiscence. No active bleeding noted. Moderate edema noted to the surgical extremity.  Radiographic Exam 12/08/2020 RT foot:  Absence of the middle and distal phalanx of the right third toe noted.  Absence of the second toe at the MTP joint.  Appear stable with routine healing  Assessment: 1. s/p right third partial toe amputation. DOS: 11/30/2020 2. H/o RT 2nd toe amputation   Plan of Care:  1. Patient was evaluated. X-rays reviewed 2.  Dressings changed.  Patient may begin washing and showering and getting foot wet 3.  Recommend that he continues to take an additional 2 weeks off of work 4.  Continue postsurgical shoe 5.  Return to clinic in 2 weeks for suture removal   [name], DPM [facility]  Dr. [name], DPM    [address], [place] [address]                Office [phone]  Fax [phone]

## 2020-12-11 ENCOUNTER — Telehealth: Payer: Self-pay | Admitting: Pharmacist

## 2020-12-11 ENCOUNTER — Telehealth: Payer: PPO

## 2020-12-11 NOTE — Progress Notes (Signed)
[facility] Note  12/13/2020     CHIEF COMPLAINT Patient presents for Retina Follow Up   HISTORY OF PRESENT ILLNESS: Kevin Perry is a 70 y.o. male who presents to the clinic today for:   HPI     Retina Follow Up   Patient presents with  CRVO/BRVO.  In left eye.  This started 4 weeks ago.  I, the attending physician,  performed the HPI with the patient and updated documentation appropriately.        Comments   Patient here for 4 weeks retina follow up for CRVO w/CME OS. Patient states vision thinks a little better. Hard to tell. No eye pain.       Last edited by [name], MD on 12/13/2020  1:00 PM.     Referring physician: [name], DO [place],  [place] [address]  HISTORICAL INFORMATION:  Selected notes from the MEDICAL RECORD NUMBER Referred by Dr. [name] for decreased vision and macular edema OS   CURRENT MEDICATIONS: No current outpatient medications on file. (Ophthalmic Drugs)   No current facility-administered medications for this visit. (Ophthalmic Drugs)   Current Outpatient Medications (Other)  Medication Sig   acetaminophen (TYLENOL) 325 MG tablet Take by mouth. (Patient not taking: No sig reported)   amLODipine (NORVASC) 10 MG tablet Take 1 tablet (10 mg total) by mouth daily.   atorvastatin (LIPITOR) 40 MG tablet Take 1 tablet (40 mg total) by mouth daily.   busPIRone (BUSPAR) 5 MG tablet Take 1 tablet (5 mg total) by mouth 2 (two) times daily as needed (anxiety). (Patient not taking: No sig reported)   doxycycline (VIBRA-TABS) 100 MG tablet Take 1 tablet (100 mg total) by mouth 2 (two) times daily.   escitalopram (LEXAPRO) 20 MG tablet Take 1 tablet (20 mg total) by mouth at bedtime.   folic acid (FOLVITE) 1 MG tablet Take 1 mg by mouth See admin instructions. Take 1 mg daily except skip dose on Wednesdays (methotrexate day)   furosemide (LASIX) 40 MG tablet Take 1 tablet (40 mg total) by  mouth daily as needed.   gabapentin (NEURONTIN) 100 MG capsule TAKE 1 CAPSULE BY MOUTH ONCE DAILY INCREASE BY 1 CAPSULE EVERY 2-3 DAYS AS TOLERATED UPTO 3X A DAY OR 3 CAPSULESONCE IN EVENING   glipiZIDE (GLUCOTROL) 10 MG tablet Take 1 tablet (10 mg total) by mouth daily before breakfast.   Insulin Pen Needle 31G X 5 MM MISC 1 each by Does not apply route daily.   lisinopril (ZESTRIL) 20 MG tablet Take 1 tablet by mouth daily.   loratadine (CLARITIN) 10 MG tablet Take 10 mg by mouth every morning.   metFORMIN (GLUCOPHAGE) 500 MG tablet Take 2 in morning with breakfast and take 1 tab in evening with dinner   methotrexate (RHEUMATREX) 2.5 MG tablet Take 7.5 mg by mouth every Wednesday.   metoprolol tartrate (LOPRESSOR) 25 MG tablet Take 1 tablet (25 mg total) by mouth 2 (two) times daily.   nitroGLYCERIN (NITROSTAT) 0.4 MG SL tablet Place under the tongue.   oxyCODONE-acetaminophen (PERCOCET) 5-325 MG tablet Take 1 tablet by mouth every 4 (four) hours as needed for severe pain.   tamsulosin (FLOMAX) 0.4 MG CAPS capsule Take 1 capsule (0.4 mg total) by mouth daily.   TRULICITY 1.5 YI/9.4WN SOPN INJECT 1.5MG INTO SKIN ONCE WEEKLY (Patient taking differently: Inject 1.5 mg as directed every Wednesday.)   No current facility-administered medications for  this visit. (Other)   REVIEW OF SYSTEMS: ROS   Positive for: Endocrine, Cardiovascular, Eyes, Psychiatric Negative for: Constitutional, Gastrointestinal, Neurological, Skin, Genitourinary, Musculoskeletal, HENT, Respiratory, Allergic/Imm, Heme/Lymph Last edited by [name], COA on 12/13/2020  9:27 AM.     ALLERGIES Allergies  Allergen Reactions   Naproxen Swelling and Anaphylaxis   PAST MEDICAL HISTORY Past Medical History:  Diagnosis Date   Anxiety    Arthritis    Coronary artery disease    Myocardial infarction [facility])    Postoperative nausea 11/27/2019   Past Surgical History:  Procedure Laterality Date   CHOLECYSTECTOMY      CORONARY ANGIOPLASTY WITH STENT PLACEMENT N/A 2008   FOOT SURGERY Left    DOS 8.1.14 HALLIX IPJ FUSION, SECOND MET OSTEOTOMY W/SCREW, HAM TOE REPAIR 2,,4 , PARTIAL AMP 3RD DIGIT    GALLBLADDER SURGERY     KNEE SURERY Right    LEFT HEART CATH AND CORONARY ANGIOGRAPHY Left 11/17/2019   Procedure: LEFT HEART CATH AND CORONARY ANGIOGRAPHY;  Surgeon: [name], MD;  Location: [facility];  Service: Cardiovascular;  Laterality: Left;   TOTAL KNEE ARTHROPLASTY Right 08/30/2014   Procedure: TOTAL KNEE ARTHROPLASTY;  Surgeon: [name], MD;  Location: [facility] ORS;  Service: Orthopedics;  Laterality: Right;   FAMILY HISTORY History reviewed. No pertinent family history.  SOCIAL HISTORY Social History   Tobacco Use   Smoking status: Former    Types: Cigarettes    Quit date: 08/16/2004    Years since quitting: 16.3   Smokeless tobacco: Former   Tobacco comments:    quit 20 plus years  Vaping Use   Vaping Use: Never used  Substance Use Topics   Alcohol use: Yes    Alcohol/week: 2.0 standard drinks    Types: 2 Cans of beer per week    Comment: SOCIAL   Drug use: No       OPHTHALMIC EXAM:  Base Eye Exam     Visual Acuity (Snellen - Linear)       Right Left   Dist cc 20/25 -2 20/80 -2   Dist ph cc NI NI    Correction: Glasses         Tonometry (Tonopen, 9:25 AM)       Right Left   Pressure 18 16         Pupils       Dark Light Shape React APD   Right 3 2 Round Brisk None   Left 3 2 Round Brisk None         Visual Fields (Counting fingers)       Left Right    Full Full         Extraocular Movement       Right Left    Full Full         Neuro/Psych     Oriented x3: Yes   Mood/Affect: Normal         Dilation     Both eyes: 1.0% Mydriacyl, 2.5% Phenylephrine @ 9:25 AM           Slit Lamp and Fundus Exam     Slit Lamp Exam       Right Left   Lids/Lashes Dermato, mild MGD, telangiectasia Dermato, mild MGD, telangiectasia    Conjunctiva/Sclera White and quiet White and quiet   Cornea Arcus, trace PEE Arcus, trace PEE   Anterior Chamber Deep and quiet Deep and quiet   Iris Round and dilated, no  NVI, TID @ 0300 limbus--patent LPI Round and dilated, no NVI, TID @ 0900 limbus--patent LPI   Lens PCIOL in good position PCIOL in good position   Vitreous Synerisis Synerisis         Fundus Exam       Right Left   Disc Mild pallor, sharp rim Pink, sharp, +fine vascular loops + hyperemia   C/D Ratio 0.4 0.6   Macula Flat, blunted foveal reflex, RPE mottling, no heme or edema Blunted foveal reflex, scattered IRH/DBH greatest temporal to fovea, +edema -- improved, mild ERM w/central lamellar hole   Vessels Attenuated arterioles, +copper wiring, tortuous, dilated venuals Attenuated, tortuous, +AV crossing changs, dilated venules   Periphery Attached, no heme Attached, 360 MA/DBH -- improved           Refraction     Wearing Rx       Sphere Cylinder Axis Add   Right -1.00 +2.25 005 +2.25   Left -1.50 +1.50 170 +2.25           IMAGING AND PROCEDURES  Imaging and Procedures for 12/13/2020  OCT, Retina - OU - Both Eyes       Right Eye Quality was good. Central Foveal Thickness: 244. Progression has been stable. Findings include normal foveal contour, no IRF, no SRF (Partial PVD).   Left Eye Quality was good. Central Foveal Thickness: 287. Progression has improved. Findings include abnormal foveal contour, intraretinal fluid, no SRF, lamellar hole, outer retinal atrophy, intraretinal hyper-reflective material, epiretinal membrane (Interval improvement in temporal cystic changes/edema, ERM w/lamellar hole; central atrophy).   Notes *Images captured and stored on drive  Diagnosis / Impression:  OD: NFP, No IRF/SRF, Partial PVD OS: Interval improvement in temporal cystic changes/edema, ERM w/lamellar hole; central atrophy  Clinical management:  See below  Abbreviations: NFP - Normal foveal profile.  CME - cystoid macular edema. PED - pigment epithelial detachment. IRF - intraretinal fluid. SRF - subretinal fluid. EZ - ellipsoid zone. ERM - epiretinal membrane. ORA - outer retinal atrophy. ORT - outer retinal tubulation. SRHM - subretinal hyper-reflective material. IRHM - intraretinal hyper-reflective material      Intravitreal Injection, Pharmacologic Agent - OS - Left Eye       Time Out 12/13/2020. 10:11 AM. Confirmed correct patient, procedure, site, and patient consented.   Anesthesia Topical anesthesia was used. Anesthetic medications included Proparacaine 0.5%, Lidocaine 2%.   Procedure Preparation included 5% betadine to ocular surface, eyelid speculum. A supplied needle was used.   Injection: 1.25 mg Bevacizumab 1.24m/0.05ml   Route: Intravitreal, Site: Left Eye   NDC: [id number] Lot:: [id number] Expiration date: 01/23/2021, Waste: 0.05 mL   Post-op Post injection exam found visual acuity of at least counting fingers. The patient tolerated the procedure well. There were no complications. The patient received written and verbal post procedure care education. Post injection medications were not given.             ASSESSMENT/PLAN:    ICD-10-CM   1. Central retinal vein occlusion with macular edema of left eye  H34.8120 Intravitreal Injection, Pharmacologic Agent - OS - Left Eye    2. Retinal edema  H35.81 OCT, Retina - OU - Both Eyes    3. Essential hypertension  I10     4. Hypertensive retinopathy of both eyes  H35.033     5. Epiretinal membrane (ERM) of left eye  H35.372     6. Lamellar macular hole of left eye  H35.342     7. Diabetes  mellitus type 2 without retinopathy ([place])  E11.9     8. Pseudophakia of both eyes  Z96.1      1,2. Remote CRVO w/ CME, OS  - pt reports progressive decline in vision OS, first noted ~6-7 mos ago  - waited for regularly scheduled f/u with Dr. Ellin Mayhew for evaluation  - pt w/ significant cardiovascular history -- MI in 2008,  s/p CABG Oct 2021; pt reports spikes in BP ~3 mos ago (180s/110s)             - FA 10.12.22 shows remote RVO w/ CME.  Cluster of perivascular, perifoveal leakage temporal macula--? Partial petaloid pattern  - s/p IVA OS #1 (10.12.22)             - OCT 10.12.22 shows ERM w/lamellar hole; central atrophy, interval improvement in temporal cystic changes/edema  - recommend IVA OS #2 today, 11.09.22  - RBA of procedure discussed, questions answered - informed consent obtained and signed  - see procedure note - f/u 4 wks -- DFE/OCT, possible injection  3,4. Hypertensive retinopathy OU - discussed importance of tight BP control and relation to RVO - monitor   5,6. Epiretinal membrane w/ lamellar hole OS - The natural history, anatomy, potential for loss of vision, and treatment options including vitrectomy techniques and the complications of endophthalmitis, retinal detachment, vitreous hemorrhage, cataract progression and permanent vision loss discussed with the patient. - mild ERM w/ lamellar hole - BCVA 20/80+ -- limited mostly by remote CRVO w/ central atrophy - no indication for surgery at this time - monitor for now  7. Diabetes mellitus, type 2 without retinopathy  - A1c 6.2 on 7.27.22  - OS with scattered IRH/DBH -- mostly from CRVO  - OD w/o heme  - no DME OU - The incidence, risk factors for progression, natural history and treatment options for diabetic retinopathy  were discussed with patient.   - The need for close monitoring of blood glucose, blood pressure, and serum lipids, avoiding cigarette or any type of tobacco, and the need for long term follow up was also discussed with patient. - monitor  8. Pseudophakia OU  - s/p CE/IOL OU  - IOL in good position, doing well  - monitor   Ophthalmic Meds Ordered this visit:  No orders of the defined types were placed in this encounter.    Return in about 4 weeks (around 01/10/2021) for DFE, OCT, possible injection.  There are  no Patient Instructions on file for this visit.  Explained the diagnoses, plan, and follow up with the patient and they expressed understanding.  Patient expressed understanding of the importance of proper follow up care.   This document serves as a record of services personally performed by Gardiner Sleeper, MD, PhD. It was created on their behalf by Orvan Falconer, an ophthalmic technician. The creation of this record is the provider's dictation and/or activities during the visit.    Electronically signed by: Orvan Falconer, OA, 12/13/20  1:00 PM  This document serves as a record of services personally performed by Gardiner Sleeper, MD, PhD. It was created on their behalf by Leonie Ignatz, an ophthalmic technician. The creation of this record is the provider's dictation and/or activities during the visit.    Electronically signed by: Leonie Cinsere COA, 12/13/20  1:00 PM  Gardiner Sleeper, M.D., Ph.D. Diseases & Surgery of the Retina and Vitreous [facility]  I have reviewed the above documentation for accuracy  and completeness, and I agree with the above. Gardiner Sleeper, M.D., Ph.D. 12/13/20 1:04 PM  Abbreviations: M myopia (nearsighted); A astigmatism; H hyperopia (farsighted); P presbyopia; Mrx spectacle prescription;  CTL contact lenses; OD right eye; OS left eye; OU both eyes  XT exotropia; ET esotropia; PEK punctate epithelial keratitis; PEE punctate epithelial erosions; DES dry eye syndrome; MGD meibomian gland dysfunction; ATs artificial tears; PFAT's preservative free artificial tears; [place] nuclear sclerotic cataract; PSC posterior subcapsular cataract; ERM epi-retinal membrane; PVD posterior vitreous detachment; RD retinal detachment; DM diabetes mellitus; DR diabetic retinopathy; NPDR non-proliferative diabetic retinopathy; PDR proliferative diabetic retinopathy; CSME clinically significant macular edema; DME diabetic macular edema; dbh dot blot hemorrhages; CWS  cotton wool spot; POAG primary open angle glaucoma; C/D cup-to-disc ratio; HVF humphrey visual field; GVF goldmann visual field; OCT optical coherence tomography; IOP intraocular pressure; BRVO Branch retinal vein occlusion; CRVO central retinal vein occlusion; CRAO central retinal artery occlusion; BRAO branch retinal artery occlusion; RT retinal tear; SB scleral buckle; PPV pars plana vitrectomy; VH Vitreous hemorrhage; PRP panretinal laser photocoagulation; IVK intravitreal kenalog; VMT vitreomacular traction; MH Macular hole;  NVD neovascularization of the disc; NVE neovascularization elsewhere; AREDS age related eye disease study; ARMD age related macular degeneration; POAG primary open angle glaucoma; EBMD epithelial/anterior basement membrane dystrophy; ACIOL anterior chamber intraocular lens; IOL intraocular lens; PCIOL posterior chamber intraocular lens; Phaco/IOL phacoemulsification with intraocular lens placement; [place] photorefractive keratectomy; LASIK laser assisted in situ keratomileusis; HTN hypertension; DM diabetes mellitus; COPD chronic obstructive pulmonary disease

## 2020-12-11 NOTE — Telephone Encounter (Signed)
  Chronic Care Management   Outreach Note  12/11/2020 Name: Kevin Perry MRN: [id number] DOB: 09-05-1950  Referred by: [name], DO Reason for referral : No chief complaint on file.   Was unable to reach patient via telephone today and have left HIPAA compliant voicemail asking patient to return my call.   Follow Up Plan: Will collaborate with Care Guide to outreach to schedule follow up with me  Wallace Cullens, PharmD, [place] [facility] [phone]

## 2020-12-13 ENCOUNTER — Encounter (INDEPENDENT_AMBULATORY_CARE_PROVIDER_SITE_OTHER): Payer: Self-pay | Admitting: Ophthalmology

## 2020-12-13 ENCOUNTER — Ambulatory Visit (INDEPENDENT_AMBULATORY_CARE_PROVIDER_SITE_OTHER): Payer: PPO | Admitting: Ophthalmology

## 2020-12-13 ENCOUNTER — Other Ambulatory Visit: Payer: Self-pay

## 2020-12-13 DIAGNOSIS — H35342 Macular cyst, hole, or pseudohole, left eye: Secondary | ICD-10-CM

## 2020-12-13 DIAGNOSIS — I10 Essential (primary) hypertension: Secondary | ICD-10-CM

## 2020-12-13 DIAGNOSIS — Z961 Presence of intraocular lens: Secondary | ICD-10-CM

## 2020-12-13 DIAGNOSIS — H35033 Hypertensive retinopathy, bilateral: Secondary | ICD-10-CM

## 2020-12-13 DIAGNOSIS — H3581 Retinal edema: Secondary | ICD-10-CM

## 2020-12-13 DIAGNOSIS — H35372 Puckering of macula, left eye: Secondary | ICD-10-CM

## 2020-12-13 DIAGNOSIS — H34812 Central retinal vein occlusion, left eye, with macular edema: Secondary | ICD-10-CM | POA: Diagnosis not present

## 2020-12-13 DIAGNOSIS — E119 Type 2 diabetes mellitus without complications: Secondary | ICD-10-CM

## 2020-12-13 MED ORDER — BEVACIZUMAB CHEMO INJECTION 1.25MG/0.05ML SYRINGE FOR KALEIDOSCOPE
1.2500 mg | INTRAVITREAL | Status: AC | PRN
  Administered 2020-12-13: 1.25 mg via INTRAVITREAL

## 2020-12-15 ENCOUNTER — Encounter: Payer: PPO | Admitting: Podiatry

## 2020-12-20 ENCOUNTER — Telehealth: Payer: Self-pay

## 2020-12-20 NOTE — Chronic Care Management (AMB) (Signed)
  Care Management   Note  12/20/2020 Name: Kevin Perry MRN: [id number] DOB: 06-12-1950  Kevin Perry is a 70 y.o. year old male who is a primary care patient of [name], DO and is actively engaged with the care management team. I reached out to [employer] by phone today to assist with re-scheduling a follow up visit with the Pharmacist  Follow up plan: Unsuccessful telephone outreach attempt made. A HIPAA compliant phone message was left for the patient providing contact information and requesting a return call.  The care management team will reach out to the patient again over the next 7 days.  If patient returns call to provider office, please advise to call [facility]  at [address], [place], [place], [place] [address] Direct Dial: [phone] [email or web address] Website: [email or web address]

## 2020-12-22 ENCOUNTER — Encounter: Payer: Self-pay | Admitting: Podiatry

## 2020-12-22 ENCOUNTER — Other Ambulatory Visit: Payer: Self-pay

## 2020-12-22 ENCOUNTER — Ambulatory Visit (INDEPENDENT_AMBULATORY_CARE_PROVIDER_SITE_OTHER): Payer: PPO | Admitting: Podiatry

## 2020-12-22 VITALS — Temp 98.4°F

## 2020-12-22 DIAGNOSIS — Z9889 Other specified postprocedural states: Secondary | ICD-10-CM

## 2020-12-22 NOTE — Progress Notes (Signed)
   Subjective:  Patient presents today status post right third partial toe amputation. DOS: 11/30/2020.  Patient is doing very well.  He has been walking just fine.  He did have some slight irritation to the amputation stump he believes it is because of the sutures.  He presents for further treatment and evaluation  Past Medical History:  Diagnosis Date   Anxiety    Arthritis    Coronary artery disease    Myocardial infarction [facility])    Postoperative nausea 11/27/2019      Objective/Physical Exam Neurovascular status intact.  Skin incisions appear to be well coapted with sutures and staples intact. No sign of infectious process noted. No dehiscence. No active bleeding noted.  No edema noted to the surgical extremity.  Radiographic Exam 12/08/2020 RT foot:  Absence of the middle and distal phalanx of the right third toe noted.  Absence of the second toe at the MTP joint.  Appear stable with routine healing  Assessment: 1. s/p right third partial toe amputation. DOS: 11/30/2020 2. H/o RT 2nd toe amputation   Plan of Care:  1. Patient was evaluated.  2.  Sutures removed. 3.  Patient may slowly increase to full activity no restrictions 4.  Return to clinic as needed  [name], DPM [facility]  Dr. [name], DPM    [address], [place] [address]                Office [phone]  Fax [phone]

## 2021-01-02 ENCOUNTER — Encounter: Payer: PPO | Admitting: Podiatry

## 2021-01-03 ENCOUNTER — Telehealth: Payer: Self-pay

## 2021-01-03 NOTE — Chronic Care Management (AMB) (Signed)
  Care Management   Note  01/03/2021 Name: TAUNO FALOTICO MRN: [id number] DOB: 05/25/1950  DONZELL COLLER is a 70 y.o. year old male who is a primary care patient of [name], DO and is actively engaged with the care management team. I reached out to Darcel Bayley by phone today to assist with re-scheduling a follow up visit with the Pharmacist  Follow up plan: Telephone appointment with care management team member scheduled for:01/15/2021  Noreene Larsson, [place], [place],  [id number] Direct Dial: [phone] [email or web address]: [email or web address]

## 2021-01-03 NOTE — Telephone Encounter (Signed)
Pt has been rescheduled. 

## 2021-01-03 NOTE — Telephone Encounter (Signed)
Error trying to find G[facility] information

## 2021-01-05 NOTE — Progress Notes (Signed)
[facility] Note  01/10/2021     CHIEF COMPLAINT Patient presents for Retina Follow Up   HISTORY OF PRESENT ILLNESS: Kevin Perry is a 70 y.o. male who presents to the clinic today for:   HPI     Retina Follow Up   Patient presents with  CRVO/BRVO.  In left eye.  This started months ago.  Severity is moderate.  Duration of 4 weeks.  I, the attending physician,  performed the HPI with the patient and updated documentation appropriately.        Comments   70 y/o male pt here for 4 wk f/u for remote CRVO w/CME OS.  No change in [place] OU.  Denies pain, FOL, floaters.  No gtts.  BS 132 2 days ago.  Last A1C 6.2 7.27.22.      Last edited by [name], MD on 01/10/2021 10:03 AM.     Referring physician: [name], DO [place],  [place] [id number]  HISTORICAL INFORMATION:  Selected notes from the MEDICAL RECORD NUMBER Referred by Dr. [name] for decreased vision and macular edema OS   CURRENT MEDICATIONS: No current outpatient medications on file. (Ophthalmic Drugs)   No current facility-administered medications for this visit. (Ophthalmic Drugs)   Current Outpatient Medications (Other)  Medication Sig   acetaminophen (TYLENOL) 325 MG tablet Take by mouth. (Patient not taking: No sig reported)   amLODipine (NORVASC) 10 MG tablet Take 1 tablet (10 mg total) by mouth daily.   atorvastatin (LIPITOR) 40 MG tablet Take 1 tablet (40 mg total) by mouth daily.   busPIRone (BUSPAR) 5 MG tablet Take 1 tablet (5 mg total) by mouth 2 (two) times daily as needed (anxiety). (Patient not taking: No sig reported)   doxycycline (VIBRA-TABS) 100 MG tablet Take 1 tablet (100 mg total) by mouth 2 (two) times daily.   escitalopram (LEXAPRO) 20 MG tablet Take 1 tablet (20 mg total) by mouth at bedtime.   folic acid (FOLVITE) 1 MG tablet Take 1 mg by mouth See admin instructions. Take 1 mg daily except skip dose on Wednesdays (methotrexate  day)   furosemide (LASIX) 40 MG tablet Take 1 tablet (40 mg total) by mouth daily as needed.   gabapentin (NEURONTIN) 100 MG capsule TAKE 1 CAPSULE BY MOUTH ONCE DAILY INCREASE BY 1 CAPSULE EVERY 2-3 DAYS AS TOLERATED UPTO 3X A DAY OR 3 CAPSULESONCE IN EVENING   glipiZIDE (GLUCOTROL) 10 MG tablet Take 1 tablet (10 mg total) by mouth daily before breakfast.   Insulin Pen Needle 31G X 5 MM MISC 1 each by Does not apply route daily.   lisinopril (ZESTRIL) 20 MG tablet Take 1 tablet by mouth daily.   loratadine (CLARITIN) 10 MG tablet Take 10 mg by mouth every morning.   metFORMIN (GLUCOPHAGE) 500 MG tablet Take 2 in morning with breakfast and take 1 tab in evening with dinner   methotrexate (RHEUMATREX) 2.5 MG tablet Take 7.5 mg by mouth every Wednesday.   metoprolol tartrate (LOPRESSOR) 25 MG tablet Take 1 tablet (25 mg total) by mouth 2 (two) times daily.   nitroGLYCERIN (NITROSTAT) 0.4 MG SL tablet Place under the tongue.   oxyCODONE-acetaminophen (PERCOCET) 5-325 MG tablet Take 1 tablet by mouth every 4 (four) hours as needed for severe pain.   tamsulosin (FLOMAX) 0.4 MG CAPS capsule Take 1 capsule (0.4 mg total) by mouth daily.   TRULICITY 1.5 KM/6.3OT SOPN INJECT 1.$RemoveBefor'5MG'[id number]$  INTO  SKIN ONCE WEEKLY (Patient taking differently: Inject 1.5 mg as directed every Wednesday.)   No current facility-administered medications for this visit. (Other)   REVIEW OF SYSTEMS: ROS   Positive for: Neurological, Musculoskeletal, Endocrine, Cardiovascular, Eyes Negative for: Constitutional, Gastrointestinal, Skin, Genitourinary, HENT, Respiratory, Psychiatric, Allergic/Imm, Heme/Lymph Last edited by Matthew Folks, COA on 01/10/2021  8:34 AM.      ALLERGIES Allergies  Allergen Reactions   Naproxen Swelling and Anaphylaxis   PAST MEDICAL HISTORY Past Medical History:  Diagnosis Date   Anxiety    Arthritis    Coronary artery disease    Hypertensive retinopathy    Myocardial infarction [facility])     Postoperative nausea 11/27/2019   Past Surgical History:  Procedure Laterality Date   CATARACT EXTRACTION     CHOLECYSTECTOMY     CORONARY ANGIOPLASTY WITH STENT PLACEMENT N/A 02/04/2006   EYE SURGERY     FOOT SURGERY Left    DOS 8.1.14 HALLIX IPJ FUSION, SECOND MET OSTEOTOMY W/SCREW, HAM TOE REPAIR 2,,4 , PARTIAL AMP 3RD DIGIT    GALLBLADDER SURGERY     KNEE SURERY Right    LEFT HEART CATH AND CORONARY ANGIOGRAPHY Left 11/17/2019   Procedure: LEFT HEART CATH AND CORONARY ANGIOGRAPHY;  Surgeon: [name], MD;  Location: [facility];  Service: Cardiovascular;  Laterality: Left;   TOTAL KNEE ARTHROPLASTY Right 08/30/2014   Procedure: TOTAL KNEE ARTHROPLASTY;  Surgeon: [name], MD;  Location: [facility];  Service: Orthopedics;  Laterality: Right;   FAMILY HISTORY History reviewed. No pertinent family history.  SOCIAL HISTORY Social History   Tobacco Use   Smoking status: Former    Types: Cigarettes    Quit date: 08/16/2004    Years since quitting: 16.4   Smokeless tobacco: Former   Tobacco comments:    quit 20 plus years  Vaping Use   Vaping Use: Never used  Substance Use Topics   Alcohol use: Yes    Alcohol/week: 2.0 standard drinks    Types: 2 Cans of beer per week    Comment: SOCIAL   Drug use: No       OPHTHALMIC EXAM:  Base Eye Exam     Visual Acuity (Snellen - Linear)       Right Left   Dist cc 20/20 20/30 -2   Dist ph cc  NI    Correction: Glasses         Tonometry (Tonopen, 8:36 AM)       Right Left   Pressure 17 17         Pupils       Dark Light Shape React APD   Right 3 2 Round Brisk None   Left 3 2 Round Brisk None         Visual Fields (Counting fingers)       Left Right    Full Full         Extraocular Movement       Right Left    Full Full         Neuro/Psych     Oriented x3: Yes   Mood/Affect: Normal         Dilation     Both eyes: 1.0% Mydriacyl, 2.5% Phenylephrine @ 8:37 AM            Slit Lamp and Fundus Exam     Slit Lamp Exam       Right Left   Lids/Lashes Dermato, mild MGD, telangiectasia Dermato, mild  MGD, telangiectasia   Conjunctiva/Sclera White and quiet White and quiet   Cornea Arcus, trace PEE Arcus, trace PEE   Anterior Chamber Deep and quiet Deep and quiet   Iris Round and dilated, no NVI, TID @ 0300 limbus--patent LPI Round and dilated, no NVI, TID @ 0900 limbus--patent LPI   Lens PCIOL in good position PCIOL in good position   Anterior Vitreous Synerisis Synerisis         Fundus Exam       Right Left   Disc Mild pallor, sharp rim Pink, sharp, +fine vascular loops +hyperemia - improving   C/D Ratio 0.4 0.6   Macula Flat, good foveal reflex, RPE mottling, no heme or edema Blunted foveal reflex, scattered IRH/DBH greatest temporal to fovea - improved, +edema -- improved, mild ERM w/central lamellar hole   Vessels Attenuated arterioles, +copper wiring, tortuous, dilated venuals Attenuated, tortuous, +AV crossing changs, dilated venules   Periphery Attached, no heme, mild reticular degeneration Attached, mild 360 MA/DBH -- improved           IMAGING AND PROCEDURES  Imaging and Procedures for 01/10/2021  OCT, Retina - OU - Both Eyes       Right Eye Quality was good. Central Foveal Thickness: 238. Progression has been stable. Findings include normal foveal contour, no IRF, no SRF (Partial PVD).   Left Eye Quality was good. Central Foveal Thickness: 284. Progression has been stable. Findings include abnormal foveal contour, intraretinal fluid, no SRF, lamellar hole, outer retinal atrophy, intraretinal hyper-reflective material, epiretinal membrane (Persistent temporal cystic changes/edema -- ?mild interval improvement, ERM w/lamellar hole; central atrophy).   Notes *Images captured and stored on drive  Diagnosis / Impression:  OD: NFP, No IRF/SRF, Partial PVD OS: persistent temporal cystic changes/edema -- ?mild interval improvement, ERM  w/lamellar hole; central atrophy  Clinical management:  See below  Abbreviations: NFP - Normal foveal profile. CME - cystoid macular edema. PED - pigment epithelial detachment. IRF - intraretinal fluid. SRF - subretinal fluid. EZ - ellipsoid zone. ERM - epiretinal membrane. ORA - outer retinal atrophy. ORT - outer retinal tubulation. SRHM - subretinal hyper-reflective material. IRHM - intraretinal hyper-reflective material      Intravitreal Injection, Pharmacologic Agent - OS - Left Eye       Time Out 01/10/2021. 9:04 AM. Confirmed correct patient, procedure, site, and patient consented.   Anesthesia Topical anesthesia was used. Anesthetic medications included Proparacaine 0.5%, Lidocaine 2%.   Procedure Preparation included 5% betadine to ocular surface, eyelid speculum. A supplied needle was used.   Injection: 1.25 mg Bevacizumab 1.44m/0.05ml   Route: Intravitreal, Site: Left Eye   NDC:: 74259-563-87 Lot: [id number] , Expiration date: 02/13/2021, Waste: 0 mL   Post-op Post injection exam found visual acuity of at least counting fingers. The patient tolerated the procedure well. There were no complications. The patient received written and verbal post procedure care education. Post injection medications were not given.            ASSESSMENT/PLAN:    ICD-10-CM   1. Central retinal vein occlusion with macular edema of left eye  H34.8120 Intravitreal Injection, Pharmacologic Agent - OS - Left Eye    Bevacizumab (AVASTIN) SOLN 1.25 mg    2. Retinal edema  H35.81 OCT, Retina - OU - Both Eyes    3. Essential hypertension  I10     4. Hypertensive retinopathy of both eyes  H35.033     5. Epiretinal membrane (ERM) of left eye  H35.372  6. Lamellar macular hole of left eye  H35.342     7. Diabetes mellitus type 2 without retinopathy ([place])  E11.9     8. Pseudophakia of both eyes  Z96.1      1,2. Remote CRVO w/ CME, OS  - pt reports progressive decline in vision OS,  first noted ~6-7 mos ago  - waited for regularly scheduled f/u with Dr. Ellin Mayhew for evaluation  - pt w/ significant cardiovascular history -- MI in 2008, s/p CABG Oct 2021; pt reports spikes in BP ~3 mos ago (180s/110s)             - FA 10.12.22 shows remote RVO w/ CME.  Cluster of perivascular, perifoveal leakage temporal macula--? Partial petaloid pattern  - s/p IVA OS #1 (10.12.22), #2 (11.09.22)  - BCVA 20/30 from 20/80             - OCT 10.12.22 shows ERM w/lamellar hole; central atrophy, persistent temporal cystic changes/edema -- ?mild interval improvement at 4 wks  - recommend IVA OS #3 today, 12.06.22  - RBA of procedure discussed, questions answered - informed consent obtained and signed  - see procedure note - f/u 4 wks -- DFE/OCT, possible injection  3,4. Hypertensive retinopathy OU - discussed importance of tight BP control and relation to RVO - monitor   5,6. Epiretinal membrane w/ lamellar hole OS - mild ERM w/ lamellar hole - BCVA 20/80+ -- limited mostly by remote CRVO w/ central atrophy - no indication for surgery at this time - monitor for now  7. Diabetes mellitus, type 2 without retinopathy  - A1c 6.2 on 7.27.22  - OS with scattered IRH/DBH -- mostly from CRVO  - OD w/o heme  - no DME OU - monitor  8. Pseudophakia OU  - s/p CE/IOL OU  - IOL in good position, doing well  - monitor   Ophthalmic Meds Ordered this visit:  Meds ordered this encounter  Medications   Bevacizumab (AVASTIN) SOLN 1.25 mg     Return in about 4 weeks (around 02/07/2021) for f/u CRVO OS, DFE, OCT.  There are no Patient Instructions on file for this visit.  Explained the diagnoses, plan, and follow up with the patient and they expressed understanding.  Patient expressed understanding of the importance of proper follow up care.   This document serves as a record of services personally performed by Gardiner Sleeper, MD, PhD. It was created on their behalf by Orvan Falconer, an  ophthalmic technician. The creation of this record is the provider's dictation and/or activities during the visit.    Electronically signed by: Orvan Falconer, OA, 01/10/21  10:11 AM  This document serves as a record of services personally performed by Gardiner Sleeper, MD, PhD. It was created on their behalf by San Jetty. Owens Shark, OA an ophthalmic technician. The creation of this record is the provider's dictation and/or activities during the visit.    Electronically signed by: San Jetty. Owens Shark, OA _0 @ 10:11 AM   Gardiner Sleeper, M.D., Ph.D. Diseases & Surgery of the Retina and Vitreous [facility]  I have reviewed the above documentation for accuracy and completeness, and I agree with the above. Gardiner Sleeper, M.D., Ph.D. 01/10/21 10:11 AM   Abbreviations: M myopia (nearsighted); A astigmatism; H hyperopia (farsighted); P presbyopia; Mrx spectacle prescription;  CTL contact lenses; OD right eye; OS left eye; OU both eyes  XT exotropia; ET esotropia; PEK punctate epithelial keratitis; PEE punctate  epithelial erosions; DES dry eye syndrome; MGD meibomian gland dysfunction; ATs artificial tears; PFAT's preservative free artificial tears; [place] nuclear sclerotic cataract; PSC posterior subcapsular cataract; ERM epi-retinal membrane; PVD posterior vitreous detachment; RD retinal detachment; DM diabetes mellitus; DR diabetic retinopathy; NPDR non-proliferative diabetic retinopathy; PDR proliferative diabetic retinopathy; CSME clinically significant macular edema; DME diabetic macular edema; dbh dot blot hemorrhages; CWS cotton wool spot; POAG primary open angle glaucoma; C/D cup-to-disc ratio; HVF humphrey visual field; GVF goldmann visual field; OCT optical coherence tomography; IOP intraocular pressure; BRVO Branch retinal vein occlusion; CRVO central retinal vein occlusion; CRAO central retinal artery occlusion; BRAO branch retinal artery occlusion; RT retinal tear; SB scleral  buckle; PPV pars plana vitrectomy; VH Vitreous hemorrhage; PRP panretinal laser photocoagulation; IVK intravitreal kenalog; VMT vitreomacular traction; MH Macular hole;  NVD neovascularization of the disc; NVE neovascularization elsewhere; AREDS age related eye disease study; ARMD age related macular degeneration; POAG primary open angle glaucoma; EBMD epithelial/anterior basement membrane dystrophy; ACIOL anterior chamber intraocular lens; IOL intraocular lens; PCIOL posterior chamber intraocular lens; Phaco/IOL phacoemulsification with intraocular lens placement; [place] photorefractive keratectomy; LASIK laser assisted in situ keratomileusis; HTN hypertension; DM diabetes mellitus; COPD chronic obstructive pulmonary disease

## 2021-01-10 ENCOUNTER — Encounter (INDEPENDENT_AMBULATORY_CARE_PROVIDER_SITE_OTHER): Payer: Self-pay | Admitting: Ophthalmology

## 2021-01-10 ENCOUNTER — Ambulatory Visit (INDEPENDENT_AMBULATORY_CARE_PROVIDER_SITE_OTHER): Payer: PPO | Admitting: Ophthalmology

## 2021-01-10 ENCOUNTER — Other Ambulatory Visit: Payer: Self-pay

## 2021-01-10 DIAGNOSIS — H35342 Macular cyst, hole, or pseudohole, left eye: Secondary | ICD-10-CM | POA: Diagnosis not present

## 2021-01-10 DIAGNOSIS — Z961 Presence of intraocular lens: Secondary | ICD-10-CM | POA: Diagnosis not present

## 2021-01-10 DIAGNOSIS — E119 Type 2 diabetes mellitus without complications: Secondary | ICD-10-CM

## 2021-01-10 DIAGNOSIS — I10 Essential (primary) hypertension: Secondary | ICD-10-CM | POA: Diagnosis not present

## 2021-01-10 DIAGNOSIS — H34812 Central retinal vein occlusion, left eye, with macular edema: Secondary | ICD-10-CM | POA: Diagnosis not present

## 2021-01-10 DIAGNOSIS — H35033 Hypertensive retinopathy, bilateral: Secondary | ICD-10-CM | POA: Diagnosis not present

## 2021-01-10 DIAGNOSIS — H35372 Puckering of macula, left eye: Secondary | ICD-10-CM

## 2021-01-10 DIAGNOSIS — H3581 Retinal edema: Secondary | ICD-10-CM

## 2021-01-10 MED ORDER — BEVACIZUMAB CHEMO INJECTION 1.25MG/0.05ML SYRINGE FOR KALEIDOSCOPE
1.2500 mg | INTRAVITREAL | Status: AC | PRN
  Administered 2021-01-10: 1.25 mg via INTRAVITREAL

## 2021-01-15 ENCOUNTER — Ambulatory Visit: Payer: PPO | Admitting: Pharmacist

## 2021-01-15 DIAGNOSIS — I10 Essential (primary) hypertension: Secondary | ICD-10-CM

## 2021-01-15 DIAGNOSIS — E1169 Type 2 diabetes mellitus with other specified complication: Secondary | ICD-10-CM

## 2021-01-15 DIAGNOSIS — E785 Hyperlipidemia, unspecified: Secondary | ICD-10-CM

## 2021-01-15 NOTE — Patient Instructions (Signed)
Visit Information  Thank you for taking time to visit with me today. Please don't hesitate to contact me if I can be of assistance to you before our next scheduled telephone appointment.  Following are the goals we discussed today:  Our goal bad cholesterol, or LDL, is less than 70 . This is why it is important to continue taking your atorvastatin   Please check your home blood pressure, keep a log of the results and have this to review during our next appointment.   Feel free to call me with any questions or concerns. I look forward to our next call!  Wallace Cullens, PharmD, [place] [phone]  Our next appointment is by telephone on 12/23 at 11:30 am  Please call the care guide team at [phone] if you need to cancel or reschedule your appointment.    The patient verbalized understanding of instructions, educational materials, and care plan provided today and declined offer to receive copy of patient instructions, educational materials, and care plan.

## 2021-01-15 NOTE — Chronic Care Management (AMB) (Signed)
Chronic Care Management CCM Pharmacy Note  01/15/2021 Name:  Kevin Perry MRN:  [id number] DOB:  02-13-1950   Subjective: Kevin Perry is an 70 y.o. year old male who is a primary patient of [name], DO.  The CCM team was consulted for assistance with disease management and care coordination needs.    Engaged with patient by telephone for follow up visit for pharmacy case management and/or care coordination services.   Objective:  Medications Reviewed Today     Reviewed by [name], MD (Physician) on 01/10/21 at 1003  Med List Status: <None>   Medication Order Taking? Sig Documenting Provider Last Dose Status Informant  acetaminophen (TYLENOL) 325 MG tablet [id number] No Take by mouth.  Patient not taking: No sig reported   [provider] Not Taking Active   amLODipine (NORVASC) 10 MG tablet [id number]  Take 1 tablet (10 mg total) by mouth daily. [name], DO  Active   atorvastatin (LIPITOR) 40 MG tablet [id number] No Take 1 tablet (40 mg total) by mouth daily. [name], DO Taking Active   busPIRone (BUSPAR) 5 MG tablet [id number] No Take 1 tablet (5 mg total) by mouth 2 (two) times daily as needed (anxiety).  Patient not taking: No sig reported   [name], DO Not Taking Active   doxycycline (VIBRA-TABS) 100 MG tablet [id number] No Take 1 tablet (100 mg total) by mouth 2 (two) times daily. Kevin Kins, Perry Taking Active   escitalopram (LEXAPRO) 20 MG tablet [id number] No Take 1 tablet (20 mg total) by mouth at bedtime. [name], DO Taking Active   folic acid (FOLVITE) 1 MG tablet [id number] No Take 1 mg by mouth See admin instructions. Take 1 mg daily except skip dose on Wednesdays (methotrexate day) [provider] Taking Active Self  furosemide (LASIX) 40 MG tablet [id number] No Take 1 tablet (40 mg total) by mouth daily as needed. [name], DO Taking Active    gabapentin (NEURONTIN) 100 MG capsule [id number] No TAKE 1 CAPSULE BY MOUTH ONCE DAILY INCREASE BY 1 CAPSULE EVERY 2-3 DAYS AS TOLERATED UPTO 3X A DAY OR 3 CAPSULESONCE IN EVENING Karamalegos, [name], DO Taking Active   glipiZIDE (GLUCOTROL) 10 MG tablet [id number] No Take 1 tablet (10 mg total) by mouth daily before breakfast. [name], DO Taking Active   Insulin Pen Needle 31G X 5 MM MISC [id number] No 1 each by Does not apply route daily. Luciana Axe, NP Taking Active Self  lisinopril (ZESTRIL) 20 MG tablet [id number] No Take 1 tablet by mouth daily. [provider] Taking Active   loratadine (CLARITIN) 10 MG tablet [id number] No Take 10 mg by mouth every morning. [provider] Taking Active Self  metFORMIN (GLUCOPHAGE) 500 MG tablet [id number] No Take 2 in morning with breakfast and take 1 tab in evening with dinner [name], DO Taking Active   methotrexate (RHEUMATREX) 2.5 MG tablet [id number] No Take 7.5 mg by mouth every Wednesday. [provider] Taking Active Self  metoprolol tartrate (LOPRESSOR) 25 MG tablet [id number] No Take 1 tablet (25 mg total) by mouth 2 (two) times daily. [name], DO Taking Active   nitroGLYCERIN (NITROSTAT) 0.4 MG SL tablet [id number] No Place under the tongue. [provider] Taking Active   oxyCODONE-acetaminophen (PERCOCET) 5-325 MG tablet [id number] No Take 1 tablet by mouth every 4 (four) hours as needed for severe pain. Kevin Kins, Perry  Taking Active   tamsulosin (FLOMAX) 0.4 MG CAPS capsule [id number] No Take 1 capsule (0.4 mg total) by mouth daily. [name], DO Taking Active   TRULICITY 1.5 QM/5.7QI SOPN [id number] No INJECT 1.5MG  INTO SKIN ONCE WEEKLY  Patient taking differently: Inject 1.5 mg as directed every Wednesday.   [name], DO Taking Active             Pertinent Labs:  Lab Results  Component Value Date   HGBA1C 6.4 (A)  12/04/2020   Lab Results  Component Value Date   CHOL 124 03/02/2020   HDL 44 03/02/2020   LDLCALC 54 03/02/2020   TRIG 193 (H) 03/02/2020   CHOLHDL 2.8 03/02/2020   Lab Results  Component Value Date   CREATININE 0.97 03/02/2020   BUN 9 03/02/2020   NA 139 03/02/2020   K 4.0 03/02/2020   CL 100 03/02/2020   CO2 30 03/02/2020   BP Readings from Last 3 Encounters:  12/04/20 (!) 169/79  08/30/20 (!) 158/80  05/05/20 123/74   Pulse Readings from Last 3 Encounters:  12/04/20 (!) 57  08/30/20 (!) 56  05/05/20 67     SDOH:  (Social Determinants of Health) assessments and interventions performed:    [place]  Review of patient past medical history, allergies, medications, health status, including review of consultants reports, laboratory and other test data, was performed as part of comprehensive evaluation and provision of chronic care management services.   Care Plan : PharmD - Med Assistance/Management  Updates made by [name], RPH-CPP since 01/15/2021 12:00 AM     Problem: Disease Progression      Long-Range Goal: Disease Progression Prevented or Minimized   Start Date: 03/27/2020  Expected End Date: 06/25/2020  This Visit's Progress: On track  Recent Progress: On track  Priority: High  Note:   Current Barriers:  Unable to independently afford treatment regimen Patient APPROVED for Trulicity from [facility] Patient Assistance Program 05/03/20-02/03/21  Pharmacist Clinical Goal(s):  Over the next 90 days, patient will verbalize ability to afford treatment regimen through collaboration with PharmD and provider.   Interventions: 1:1 collaboration with [name], DO regarding development and update of comprehensive plan of care as evidenced by provider attestation and co-signature Inter-disciplinary care team collaboration (see longitudinal plan of care) Perform chart review Patient seen for Office Visit with [facility] on 12/7 Office Visit with [facility] on 11/18 Office Visit with PCP on 10/31. Provider advised patient: Trial a higher dose Trulicity 1.5mg  dose x 2 (one on each side) = 3mg  weekly, can try this for 2 weeks to see if you tolerate it and notice any improvement. HOLD Glipizide pill while you do this experiment. To avoid any low sugars. Elevated BP still Dose increase Amlodipine from 5 to 10mg  Reduce Lisinopril 20 from twice a day to ONCE daily Keep Metoprolol same  Hypertension: Current treatment: Amlodipine 10mg  daily Furosemide 40 mg daily Lisinopril 20 mg once daily Metoprolol tartrate 25 mg BID Reports monitoring home BP using upper arm monitor Reports readings improved; recalls recent readings: Yesterday: 118/69, HR 80 Last week: 120/70 Have counseled on impact of salt/sodium on blood pressure Counsel patient to continue to monitor home BP, keep log of results including HR readings and bring this record to medical appointments  T2DM: Controlled; current treatment: Metformin IR 500mg  x 2 in AM / x 1 in PM Trulicity 1.5 mg weekly  on Wednesdays Glipizide 10 mg daily before breakfast Reports recent morning fasting readings ranging: 120-136 Patient denies any recent low blood sugars Have encouraged patient to have regular well-balanced meals throughout the day and limit carbohydrate portion sizes Reports forgot to trial higher dose of Trulicity 3 mg weekly as discussed with PCP (while holding glipizide) on 10/31. Will trial this now, starting with Trulicity injection due on 12/14, while holding glipizide Schedule appointment to follow up regarding medication assistance program re-enrollment for 2023, allowing time to trial Trulicity 3 mg weekly dose first Reports currently has 3 boxes of Trulicity 1.5 mg remaining  Hyperlipidemia: Controlled; current treatment: atorvastatin 40 mg daily   Patient Goals/Self-Care Activities Over the next 90  days, patient will:  - check glucose, document, and provide at future appointments - check blood pressure, document, and provide at future appointments - collaborate with provider on medication access solutions - attend medical appointments as scheduled  Follow Up Plan: Telephone follow up appointment with care management team member scheduled for: 12/23 at 11:30 am       Wallace Cullens, PharmD, BCACP, CPP Clinical Pharmacist [place] [phone]

## 2021-01-24 ENCOUNTER — Ambulatory Visit (INDEPENDENT_AMBULATORY_CARE_PROVIDER_SITE_OTHER): Payer: PPO | Admitting: Family Medicine

## 2021-01-24 ENCOUNTER — Other Ambulatory Visit: Payer: Self-pay

## 2021-01-24 ENCOUNTER — Encounter: Payer: Self-pay | Admitting: Family Medicine

## 2021-01-24 VITALS — BP 150/80 | HR 66 | Ht 75.0 in | Wt 275.8 lb

## 2021-01-24 DIAGNOSIS — M79604 Pain in right leg: Secondary | ICD-10-CM | POA: Diagnosis not present

## 2021-01-24 DIAGNOSIS — S81811A Laceration without foreign body, right lower leg, initial encounter: Secondary | ICD-10-CM

## 2021-01-24 MED ORDER — AMOXICILLIN-POT CLAVULANATE 875-125 MG PO TABS: 1.0000 | ORAL_TABLET | Freq: Two times a day (BID) | ORAL | 0 refills | Status: DC

## 2021-01-24 NOTE — Patient Instructions (Addendum)
No complication of secondary infection Large area and some irregular subcutaneous tissue deformity Not amenable to suture  Replaced dressing today Recommend saline to remove dressing and apply dry non adherent dressing Rx Augmentin antibiotic as prophylaxis If not healing properly in next few days, consider return or can go to urgent care if after hours weekend for eval and otherwise we can refer to wound center for more debridement and assistance if non healing.  If not improving you may need to return for re-evaluation. But if more severe worsening such as spreading redness or streaking redness, significantly larger size, persistent drainage of pus, increased pain, fevers/chills, nausea vomiting and cannot take antibiotic. If significantly worse symptoms or most of these symptoms, would recommend going straight to Hospital Emergency Dept as you may require IV antibiotics instead.   Please schedule a Follow-up Appointment to: Return if symptoms worsen or fail to improve.  If you have any other questions or concerns, please feel free to call the office or send a message through [place]. You may also schedule an earlier appointment if necessary.  Additionally, you may be receiving a survey about your experience at our office within a few days to 1 week by e-mail or mail. We value your feedback.  [name], DO [place]

## 2021-01-24 NOTE — Progress Notes (Signed)
Subjective:    Patient ID: Kevin Perry, male    DOB: November 21, 1950, 70 y.o.   MRN: [id number]  Kevin Perry is a 70 y.o. male presenting on 01/24/2021 for Fall and Leg Injury   HPI  Right Lower Extremity Skin Abrasion Fall injury 2 nights ago on Monday, accidental fall against the bedframe and injured / skin tear of Right lower leg. Initial bleeding from skin tear and it was wrapped. He had some oozing bleeding but it slowed down with pressure. He has thin skin and often has similar issues from skin tears. It is not painful. No spreading redness.   Depression screen [facility] 2/9 12/04/2020 08/30/2020 03/02/2020  Decreased Interest 0 0 0  Down, Depressed, Hopeless 0 0 0  PHQ - 2 Score 0 0 0  Altered sleeping - 0 0  Tired, decreased energy - 0 0  Change in appetite - 0 0  Feeling bad or failure about yourself  - 0 0  Trouble concentrating - 0 0  Moving slowly or fidgety/restless - 0 0  Suicidal thoughts - 0 0  PHQ-9 Score - 0 0  Difficult doing work/chores - Not difficult at all Not difficult at all  Some recent data might be hidden    Social History   Tobacco Use   Smoking status: Former    Types: Cigarettes    Quit date: 08/16/2004    Years since quitting: 16.4   Smokeless tobacco: Former   Tobacco comments:    quit 20 plus years  Vaping Use   Vaping Use: Never used  Substance Use Topics   Alcohol use: Yes    Alcohol/week: 2.0 standard drinks    Types: 2 Cans of beer per week    Comment: SOCIAL   Drug use: No    Review of Systems Per HPI unless specifically indicated above     Objective:    BP (!) 150/80    Pulse 66    Ht 6\' 3"  (1.905 m)    Wt 275 lb 12.8 oz (125.1 kg)    SpO2 96%    BMI 34.47 kg/m   Wt Readings from Last 3 Encounters:  01/24/21 275 lb 12.8 oz (125.1 kg)  12/04/20 268 lb 3.2 oz (121.7 kg)  08/30/20 272 lb (123.4 kg)    Physical Exam Vitals and nursing note reviewed.  Constitutional:      General: He is not in acute distress.     Appearance: Normal appearance. He is well-developed. He is not diaphoretic.     Comments: Well-appearing, comfortable, cooperative  HENT:     Head: Normocephalic and atraumatic.  Eyes:     General:        Right eye: No discharge.        Left eye: No discharge.     Conjunctiva/sclera: Conjunctivae normal.  Cardiovascular:     Rate and Rhythm: Normal rate.  Pulmonary:     Effort: Pulmonary effort is normal.  Musculoskeletal:     Comments: R lower extremity lateral lower leg upper part with large 8 x 8 cm approx area of superficial skin tear, some areas of deeper subcutaneous deformity. Some oozing still after dressing removed but stopped hemostasis in office, new antibiotic ointment placed and non adherent pads and wrap placed.  Skin:    General: Skin is warm and dry.     Findings: No erythema or rash.  Neurological:     Mental Status: He is alert and oriented to person, place,  and time.  Psychiatric:        Mood and Affect: Mood normal.        Behavior: Behavior normal.        Thought Content: Thought content normal.     Comments: Well groomed, good eye contact, normal speech and thoughts   Results for orders placed or performed in visit on 12/04/20  POCT HgB A1C  Result Value Ref Range   Hemoglobin A1C 6.4 (A) 4.0 - 5.6 %  HM DIABETES EYE EXAM  Result Value Ref Range   HM Diabetic Eye Exam No Retinopathy No Retinopathy      Assessment & Plan:   Problem List Items Addressed This Visit   None Visit Diagnoses     Right leg pain    -  Primary   Noninfected skin tear of right lower extremity, initial encounter       Relevant Medications   amoxicillin-clavulanate (AUGMENTIN) 875-125 MG tablet       No complication of secondary infection Large area and some irregular subcutaneous tissue deformity Not amenable to suture  Replaced dressing today Advised daily wet to dry dressing changes Rx Augmentin antibiotic as prophylaxis If not healing properly in next few days,  consider return or can go to urgent care if after hours weekend for eval and otherwise we can refer to wound center for more debridement and assistance if non healing.  Meds ordered this encounter  Medications   amoxicillin-clavulanate (AUGMENTIN) 875-125 MG tablet    Sig: Take 1 tablet by mouth 2 (two) times daily.    Dispense:  20 tablet    Refill:  0      Follow up plan: Return if symptoms worsen or fail to improve.  Kevin Putnam, DO [facility] 01/24/2021, 10:32 AM

## 2021-01-26 ENCOUNTER — Ambulatory Visit (INDEPENDENT_AMBULATORY_CARE_PROVIDER_SITE_OTHER): Payer: PPO | Admitting: Pharmacist

## 2021-01-26 DIAGNOSIS — E1169 Type 2 diabetes mellitus with other specified complication: Secondary | ICD-10-CM

## 2021-01-26 NOTE — Patient Instructions (Signed)
Visit Information  Thank you for taking time to visit with me today. Please don't hesitate to contact me if I can be of assistance to you before our next scheduled telephone appointment.  Following are the goals we discussed today:   Goals Addressed             This Visit's Progress    Pharmacy Goals       Our goal bad cholesterol, or LDL, is less than 70 . This is why it is important to continue taking your atorvastatin   Please check your home blood pressure, keep a log of the results and have this to review during our next appointment.  Please watch the mail for an envelope from [employer] containing the patient assistance program application. Please complete this application and mail back to [place] along with a copy of your Medicare Part D prescription card and a copy of your proof of income document.   Feel free to call me with any questions or concerns. I look forward to our next call!  Wallace Cullens, PharmD, [place] [phone]         Our next appointment is by telephone on 03/12/2021 at 11:15 AM  Please call the care guide team at [phone] if you need to cancel or reschedule your appointment.    The patient verbalized understanding of instructions, educational materials, and care plan provided today and declined offer to receive copy of patient instructions, educational materials, and care plan.

## 2021-01-26 NOTE — Chronic Care Management (AMB) (Signed)
Chronic Care Management CCM Pharmacy Note  01/26/2021 Name:  Kevin Perry MRN:  [id number] DOB:  1950-03-29   Subjective: Kevin Perry is an 70 y.o. year old male who is a primary patient of [name], DO.  The CCM team was consulted for assistance with disease management and care coordination needs.    Engaged with patient by telephone for follow up visit for pharmacy case management and/or care coordination services.   Objective:  Medications Reviewed Today     Reviewed by [name], RPH-CPP (Pharmacist) on 01/26/21 at 1143  Med List Status: <None>   Medication Order Taking? Sig Documenting Provider Last Dose Status Informant  acetaminophen (TYLENOL) 325 MG tablet [id number]  Take by mouth.  Patient not taking: Reported on 11/15/2020   [provider]  Active   amLODipine (NORVASC) 10 MG tablet [id number]  Take 1 tablet (10 mg total) by mouth daily. [name], DO  Active   amoxicillin-clavulanate (AUGMENTIN) 875-125 MG tablet [id number] Yes Take 1 tablet by mouth 2 (two) times daily. [name], DO Taking Active   atorvastatin (LIPITOR) 40 MG tablet [id number]  Take 1 tablet (40 mg total) by mouth daily. Karamalegos, [name], DO  Active   busPIRone (BUSPAR) 5 MG tablet [id number]  Take 1 tablet (5 mg total) by mouth 2 (two) times daily as needed (anxiety).  Patient not taking: Reported on 11/15/2020   [name], DO  Active   escitalopram (LEXAPRO) 20 MG tablet [id number]  Take 1 tablet (20 mg total) by mouth at bedtime. Karamalegos, [name], DO  Active   folic acid (FOLVITE) 1 MG tablet [id number]  Take 1 mg by mouth See admin instructions. Take 1 mg daily except skip dose on Wednesdays (methotrexate day) [provider]  Active Self  furosemide (LASIX) 40 MG tablet [id number]  Take 1 tablet (40 mg total) by mouth daily as needed. [name], DO  Active   gabapentin  (NEURONTIN) 100 MG capsule [id number]  TAKE 1 CAPSULE BY MOUTH ONCE DAILY INCREASE BY 1 CAPSULE EVERY 2-3 DAYS AS TOLERATED UPTO 3X A DAY OR 3 CAPSULESONCE IN EVENING Karamalegos, Alexander J, DO  Active   Insulin Pen Needle 31G X 5 MM MISC [id number]  1 each by Does not apply route daily. Luciana Axe, NP  Active Self  lisinopril (ZESTRIL) 20 MG tablet [id number]  Take 1 tablet by mouth daily. [provider]  Active   loratadine (CLARITIN) 10 MG tablet [id number]  Take 10 mg by mouth every morning. [provider]  Active Self  metFORMIN (GLUCOPHAGE) 500 MG tablet [id number] Yes Take 2 in morning with breakfast and take 1 tab in evening with dinner [name], DO Taking Active   methotrexate (RHEUMATREX) 2.5 MG tablet [id number]  Take 7.5 mg by mouth every Wednesday. [provider]  Active Self  metoprolol tartrate (LOPRESSOR) 25 MG tablet [id number]  Take 1 tablet (25 mg total) by mouth 2 (two) times daily. Karamalegos, [name], DO  Active   nitroGLYCERIN (NITROSTAT) 0.4 MG SL tablet [id number]  Place under the tongue. [provider]  Active   oxyCODONE-acetaminophen (PERCOCET) 5-325 MG tablet [id number]  Take 1 tablet by mouth every 4 (four) hours as needed for severe pain. [name], DPM  Active   tamsulosin (FLOMAX) 0.4 MG CAPS capsule [id number]  Take 1 capsule (0.4 mg total) by mouth daily. [name], DO  Active   TRULICITY  1.5 MG/0.5ML SOPN [id number] Yes INJECT 1.5MG  INTO SKIN ONCE WEEKLY  Patient taking differently: Inject 3 mg as directed once a week.   [name], DO Taking Active             Pertinent Labs:  Lab Results  Component Value Date   HGBA1C 6.4 (A) 12/04/2020   Lab Results  Component Value Date   CHOL 124 03/02/2020   HDL 44 03/02/2020   LDLCALC 54 03/02/2020   TRIG 193 (H) 03/02/2020   CHOLHDL 2.8 03/02/2020   Lab Results  Component Value Date   CREATININE 0.97 03/02/2020    BUN 9 03/02/2020   NA 139 03/02/2020   K 4.0 03/02/2020   CL 100 03/02/2020   CO2 30 03/02/2020    SDOH:  (Social Determinants of Health) assessments and interventions performed:    [place]  Review of patient past medical history, allergies, medications, health status, including review of consultants reports, laboratory and other test data, was performed as part of comprehensive evaluation and provision of chronic care management services.   Care Plan : PharmD - Med Assistance/Management  Updates made by [name], RPH-CPP since 01/26/2021 12:00 AM     Problem: Disease Progression      Long-Range Goal: Disease Progression Prevented or Minimized   Start Date: 03/27/2020  Expected End Date: 06/25/2020  This Visit's Progress: On track  Recent Progress: On track  Priority: High  Note:   Current Barriers:  Unable to independently afford treatment regimen Patient APPROVED for Trulicity from [facility] Patient Assistance Program 05/03/20-02/03/21  Pharmacist Clinical Goal(s):  Over the next 90 days, patient will verbalize ability to afford treatment regimen through collaboration with PharmD and provider.   Interventions: 1:1 collaboration with [name], DO regarding development and update of comprehensive plan of care as evidenced by provider attestation and co-signature Inter-disciplinary care team collaboration (see longitudinal plan of care) Perform chart review. Patient seen for Office Visit with PCP on 12/21 for fall/leg injury. Provider advised patient: Rx Augmentin provided as prophylaxis If not healing properly in next few days, consider return or can go to urgent care if after hours weekend for evaluation Today patient reports leg is healing well and he is taking Augmentin Rx as directed. Confirms taking doses of Augmentin with food. Counsel to complete 10 day course as directed.  T2DM: Controlled; current treatment: Metformin IR 500mg  x 2 in AM  / x 1 in PM Trulicity 3 mg (currently using 2 x 1.5 mg pens (to either side of abdomen) weekly on Wednesdays, started this dose on 12/14 Reports tolerating well Confirms stopped glipizide as planned Reports recent morning fasting readings have been controlled; this morning: 118 Have encouraged patient to have regular well-balanced meals throughout the day and limit carbohydrate portion sizes Plan to continue Trulicity 3 mg weekly (will now apply to receive this strength from assistance program) and current dose of metformin.  Medication Assistance: Patient interested in re-enrolling in patient assistance program for Trulicity through [place] for 2023 calendar year. Will collaborate with [facility] CPhT for aid to patient with re-enrollment Note patient has ~2 boxes of Trulicity 1.5 mg strength pens remaining  Hyperlipidemia: Controlled; current treatment: atorvastatin 40 mg daily   Patient Goals/Self-Care Activities Over the next 90 days, patient will:  - check glucose, document, and provide at future appointments - check blood pressure, document, and provide at future appointments - collaborate with provider on medication access solutions - attend medical appointments as  scheduled  Follow Up Plan: Telephone follow up appointment with care management team member scheduled for: 03/12/2021 at 11:15 AM       Wallace Cullens, PharmD, [place], [place] [phone]

## 2021-01-29 ENCOUNTER — Other Ambulatory Visit: Payer: Self-pay | Admitting: Family Medicine

## 2021-01-29 DIAGNOSIS — I10 Essential (primary) hypertension: Secondary | ICD-10-CM

## 2021-01-30 NOTE — Telephone Encounter (Signed)
Rx written 05/05/2020 #180 with 3 refills. Pt should have enough meds to last until 05/05/2021.

## 2021-01-31 NOTE — Telephone Encounter (Signed)
Requested Prescriptions  Pending Prescriptions Disp Refills   metoprolol tartrate (LOPRESSOR) 25 MG tablet [Pharmacy Med Name: METOPROLOL TARTRATE 25 MG TAB] 180 tablet 3    Sig: TAKE 1 TABLET BY MOUTH TWICE DAILY     Cardiovascular:  Beta Blockers Failed - 01/29/2021 10:25 AM      Failed - Last BP in normal range    BP Readings from Last 1 Encounters:  01/24/21 (!) 150/80         Passed - Last Heart Rate in normal range    Pulse Readings from Last 1 Encounters:  01/24/21 66         Passed - Valid encounter within last 6 months    Recent Outpatient Visits          1 week ago Right leg pain   [place], DO   1 month ago Type 2 diabetes mellitus with other specified complication, without long-term current use of insulin ([place])   [place], DO   5 months ago Type 2 diabetes mellitus with other specified complication, without long-term current use of insulin [facility])   [place], DO   9 months ago Diabetic polyneuropathy associated with type 2 diabetes mellitus [facility])   [facility] [name], DO   11 months ago Annual physical exam   [place], [name], DO      Future Appointments            In 1 month [place], [facility], [facility]

## 2021-02-01 NOTE — Progress Notes (Signed)
[facility] Note  02/07/2021     CHIEF COMPLAINT Patient presents for Retina Follow Up    HISTORY OF PRESENT ILLNESS: Kevin Perry is a 70 y.o. male who presents to the clinic today for:   HPI     Retina Follow Up   Patient presents with  CRVO/BRVO.  In left eye.  This started 4 weeks ago.  I, the attending physician,  performed the HPI with the patient and updated documentation appropriately.        Comments   Patient here for 4 weeks retina follow up for CRVO OS. Patients states vision thinks it is improving. No eye pain.       Last edited by [name], MD on 02/07/2021  9:46 AM.    Pt states he has not noticed any change in vision  Referring physician: [name], DO [place],  [place] [address]  HISTORICAL INFORMATION:  Selected notes from the MEDICAL RECORD NUMBER Referred by Dr. [name] for decreased vision and macular edema OS   CURRENT MEDICATIONS: No current outpatient medications on file. (Ophthalmic Drugs)   No current facility-administered medications for this visit. (Ophthalmic Drugs)   Current Outpatient Medications (Other)  Medication Sig   amLODipine (NORVASC) 10 MG tablet Take 1 tablet (10 mg total) by mouth daily.   amoxicillin-clavulanate (AUGMENTIN) 875-125 MG tablet Take 1 tablet by mouth 2 (two) times daily.   atorvastatin (LIPITOR) 40 MG tablet Take 1 tablet (40 mg total) by mouth daily.   escitalopram (LEXAPRO) 20 MG tablet Take 1 tablet (20 mg total) by mouth at bedtime.   folic acid (FOLVITE) 1 MG tablet Take 1 mg by mouth See admin instructions. Take 1 mg daily except skip dose on Wednesdays (methotrexate day)   furosemide (LASIX) 40 MG tablet Take 1 tablet (40 mg total) by mouth daily as needed.   gabapentin (NEURONTIN) 100 MG capsule TAKE 1 CAPSULE BY MOUTH ONCE DAILY INCREASE BY 1 CAPSULE EVERY 2-3 DAYS AS TOLERATED UPTO 3X A DAY OR 3 CAPSULESONCE IN EVENING   Insulin Pen  Needle 31G X 5 MM MISC 1 each by Does not apply route daily.   lisinopril (ZESTRIL) 20 MG tablet Take 1 tablet by mouth daily.   loratadine (CLARITIN) 10 MG tablet Take 10 mg by mouth every morning.   metFORMIN (GLUCOPHAGE) 500 MG tablet Take 2 in morning with breakfast and take 1 tab in evening with dinner   methotrexate (RHEUMATREX) 2.5 MG tablet Take 7.5 mg by mouth every Wednesday.   metoprolol tartrate (LOPRESSOR) 25 MG tablet Take 1 tablet (25 mg total) by mouth 2 (two) times daily.   nitroGLYCERIN (NITROSTAT) 0.4 MG SL tablet Place under the tongue.   oxyCODONE-acetaminophen (PERCOCET) 5-325 MG tablet Take 1 tablet by mouth every 4 (four) hours as needed for severe pain.   tamsulosin (FLOMAX) 0.4 MG CAPS capsule Take 1 capsule (0.4 mg total) by mouth daily.   TRULICITY 1.5 DH/7.4BU SOPN INJECT 1.5MG INTO SKIN ONCE WEEKLY (Patient taking differently: Inject 3 mg as directed once a week.)   acetaminophen (TYLENOL) 325 MG tablet Take by mouth. (Patient not taking: Reported on 11/15/2020)   busPIRone (BUSPAR) 5 MG tablet Take 1 tablet (5 mg total) by mouth 2 (two) times daily as needed (anxiety). (Patient not taking: Reported on 11/15/2020)   No current facility-administered medications for this visit. (Other)   REVIEW OF SYSTEMS: ROS   Positive for:  Neurological, Musculoskeletal, Endocrine, Cardiovascular, Eyes Negative for: Constitutional, Gastrointestinal, Skin, Genitourinary, HENT, Respiratory, Psychiatric, Allergic/Imm, Heme/Lymph Last edited by [name], COA on 02/07/2021  8:30 AM.      ALLERGIES Allergies  Allergen Reactions   Naproxen Swelling and Anaphylaxis   PAST MEDICAL HISTORY Past Medical History:  Diagnosis Date   Anxiety    Arthritis    Coronary artery disease    Hypertensive retinopathy    Myocardial infarction [facility])    Postoperative nausea 11/27/2019   Past Surgical History:  Procedure Laterality Date   CATARACT EXTRACTION     CHOLECYSTECTOMY      CORONARY ANGIOPLASTY WITH STENT PLACEMENT N/A 02/04/2006   EYE SURGERY     FOOT SURGERY Left    DOS 8.1.14 HALLIX IPJ FUSION, SECOND MET OSTEOTOMY W/SCREW, HAM TOE REPAIR 2,,4 , PARTIAL AMP 3RD DIGIT    GALLBLADDER SURGERY     KNEE SURERY Right    LEFT HEART CATH AND CORONARY ANGIOGRAPHY Left 11/17/2019   Procedure: LEFT HEART CATH AND CORONARY ANGIOGRAPHY;  Surgeon: [name], MD;  Location: [place] [facility];  Service: Cardiovascular;  Laterality: Left;   TOTAL KNEE ARTHROPLASTY Right 08/30/2014   Procedure: TOTAL KNEE ARTHROPLASTY;  Surgeon: [name], MD;  Location: [facility] OR[facility];  Service: Orthopedics;  Laterality: Right;   FAMILY HISTORY History reviewed. No pertinent family history.  SOCIAL HISTORY Social History   Tobacco Use   Smoking status: Former    Types: Cigarettes    Quit date: 08/16/2004    Years since quitting: 16.4   Smokeless tobacco: Former   Tobacco comments:    quit 20 plus years  Vaping Use   Vaping Use: Never used  Substance Use Topics   Alcohol use: Yes    Alcohol/week: 2.0 standard drinks    Types: 2 Cans of beer per week    Comment: SOCIAL   Drug use: No       OPHTHALMIC EXAM:  Base Eye Exam     Visual Acuity (Snellen - Linear)       Right Left   Dist cc 20/20 -2 20/60 -2   Dist ph cc  20/50 +2    Correction: Glasses  Patient on OD cant see the last letter on any line.        Tonometry (Tonopen, 8:27 AM)       Right Left   Pressure 15 13         Pupils       Dark Light Shape React APD   Right 3 2 Round Brisk None   Left 3 2 Round Brisk None         Visual Fields (Counting fingers)       Left Right    Full Full         Extraocular Movement       Right Left    Full, Ortho Full, Ortho         Neuro/Psych     Oriented x3: Yes   Mood/Affect: Normal         Dilation     Both eyes: 1.0% Mydriacyl, 2.5% Phenylephrine @ 8:27 AM           Slit Lamp and Fundus Exam     Slit Lamp Exam        Right Left   Lids/Lashes Dermato, mild MGD, telangiectasia Dermato, mild MGD, telangiectasia   Conjunctiva/Sclera White and quiet White and quiet   Cornea Arcus, trace PEE Arcus,  trace PEE   Anterior Chamber Deep and quiet Deep and quiet   Iris Round and dilated, no NVI, TID @ 0300 limbus--patent LPI Round and dilated, no NVI, TID @ 0900 limbus--patent LPI   Lens PCIOL in good position PCIOL in good position   Anterior Vitreous Synerisis Synerisis         Fundus Exam       Right Left   Disc Mild pallor, sharp rim Pink, sharp, +fine vascular loops +hyperemia - improving   C/D Ratio 0.4 0.6   Macula Flat, good foveal reflex, RPE mottling, no heme or edema Blunted foveal reflex, scattered IRH/DBH greatest temporal to fovea - improved, +edema -- improved, mild ERM w/central lamellar hole   Vessels Attenuated arterioles, +copper wiring, tortuous, dilated venuals Attenuated, tortuous, +AV crossing changs, dilated venules   Periphery Attached, no heme, mild reticular degeneration Attached, mild 360 MA/DBH -- improved           Refraction     Wearing Rx       Sphere Cylinder Axis Add   Right -1.00 +2.25 005 +2.25   Left -1.50 +1.50 170 +2.25         Manifest Refraction       Sphere Cylinder Axis Dist VA   Right       Left -1.00 +1.75 170 20/50+           IMAGING AND PROCEDURES  Imaging and Procedures for 02/07/2021  OCT, Retina - OU - Both Eyes       Right Eye Quality was good. Central Foveal Thickness: 236. Progression has been stable. Findings include normal foveal contour, no IRF, no SRF (Partial PVD).   Left Eye Quality was good. Central Foveal Thickness: 227. Progression has improved. Findings include abnormal foveal contour, intraretinal fluid, no SRF, lamellar hole, outer retinal atrophy, intraretinal hyper-reflective material, epiretinal membrane (Interval improvement in temporal cystic changes/edema, ERM w/lamellar hole -- ?converting to full thickness hole,  central atrophy).   Notes *Images captured and stored on drive  Diagnosis / Impression:  OD: NFP, No IRF/SRF, Partial PVD OS: Interval improvement in temporal cystic changes/edema, ERM w/lamellar hole -- ?converting to full thickness hole, central atrophy  Clinical management:  See below  Abbreviations: NFP - Normal foveal profile. CME - cystoid macular edema. PED - pigment epithelial detachment. IRF - intraretinal fluid. SRF - subretinal fluid. EZ - ellipsoid zone. ERM - epiretinal membrane. ORA - outer retinal atrophy. ORT - outer retinal tubulation. SRHM - subretinal hyper-reflective material. IRHM - intraretinal hyper-reflective material      Intravitreal Injection, Pharmacologic Agent - OS - Left Eye       Time Out 02/07/2021. 8:52 AM. Confirmed correct patient, procedure, site, and patient consented.   Anesthesia Topical anesthesia was used. Anesthetic medications included Proparacaine 0.5%, Lidocaine 2%.   Procedure Preparation included 5% betadine to ocular surface, eyelid speculum. A supplied needle was used.   Injection: 1.25 mg Bevacizumab 1.6m/0.05ml   Route: Intravitreal, Site: Left Eye   NDC:: 25498-264-15 Lot: [id number]'@[id number]' , Expiration date: 03/13/2021, Waste: 0 mL   Post-op Post injection exam found visual acuity of at least counting fingers. The patient tolerated the procedure well. There were no complications. The patient received written and verbal post procedure care education. Post injection medications were not given.            ASSESSMENT/PLAN:    ICD-10-CM   1. Central retinal vein occlusion with macular edema of left eye  H34.8120 OCT, Retina -  OU - Both Eyes    Intravitreal Injection, Pharmacologic Agent - OS - Left Eye    Bevacizumab (AVASTIN) SOLN 1.25 mg    2. Essential hypertension  I10     3. Hypertensive retinopathy of both eyes  H35.033     4. Epiretinal membrane (ERM) of left eye  H35.372     5. Lamellar macular hole of left eye   H35.342     6. Diabetes mellitus type 2 without retinopathy ([place])  E11.9     7. Pseudophakia of both eyes  Z96.1      1. Remote CRVO w/ CME, OS  - pt reports progressive decline in vision OS, first noted ~6-7 mos ago  - waited for regularly scheduled f/u with Dr. Ellin Mayhew for evaluation  - pt w/ significant cardiovascular history -- MI in 2008, s/p CABG Oct 2021; pt reports spikes in BP ~3 mos ago (180s/110s)             - FA 10.12.22 shows remote RVO w/ CME.  Cluster of perivascular, perifoveal leakage temporal macula--?Partial petaloid pattern  - s/p IVA OS #1 (10.12.22), #2 (11.09.22), #3 (12.06.22)  - BCVA 20/50 from 20/30             - OCT 10.12.22 shows interval improvement in temporal cystic changes/edema, ERM w/lamellar hole -- ?converting to full thickness hole, central atrophy  - recommend IVA OS #4 today, 01.04.23  - RBA of procedure discussed, questions answered - informed consent obtained and signed  - see procedure note - f/u 4 wks -- DFE/OCT, possible injection  2,3. Hypertensive retinopathy OU - discussed importance of tight BP control and relation to RVO - monitor   4,5. Epiretinal membrane w/ lamellar hole OS - mild ERM w/ lamellar hole - BCVA 20/50+ -- limited mostly by remote CRVO w/ central atrophy - no indication for surgery at this time - monitor for now  6. Diabetes mellitus, type 2 without retinopathy  - A1c 6.2 on 7.27.22  - OS with scattered IRH/DBH -- mostly from CRVO  - OD w/o heme  - no DME OU - monitor OU  7. Pseudophakia OU  - s/p CE/IOL OU  - IOL in good position, doing well  - monitor   Ophthalmic Meds Ordered this visit:  Meds ordered this encounter  Medications   Bevacizumab (AVASTIN) SOLN 1.25 mg     Return in about 4 weeks (around 03/07/2021) for f/u CRVO OS, DFE, OCT.  There are no Patient Instructions on file for this visit.  Explained the diagnoses, plan, and follow up with the patient and they expressed understanding.   Patient expressed understanding of the importance of proper follow up care.   This document serves as a record of services personally performed by Gardiner Sleeper, MD, PhD. It was created on their behalf by Orvan Falconer, an ophthalmic technician. The creation of this record is the provider's dictation and/or activities during the visit.    Electronically signed by: Orvan Falconer, OA, 02/07/21  9:51 AM  This document serves as a record of services personally performed by Gardiner Sleeper, MD, PhD. It was created on their behalf by San Jetty. Owens Shark, OA an ophthalmic technician. The creation of this record is the provider's dictation and/or activities during the visit.    Electronically signed by: San Jetty. Owens Shark, [place] 01.04.2023 9:51 AM   Gardiner Sleeper, M.D., Ph.D. Diseases & Surgery of the Retina and [place]  I have reviewed the above documentation for accuracy and completeness, and I agree with the above. Gardiner Sleeper, M.D., Ph.D. 02/07/21 9:51 AM   Abbreviations: M myopia (nearsighted); A astigmatism; H hyperopia (farsighted); P presbyopia; Mrx spectacle prescription;  CTL contact lenses; OD right eye; OS left eye; OU both eyes  XT exotropia; ET esotropia; PEK punctate epithelial keratitis; PEE punctate epithelial erosions; DES dry eye syndrome; MGD meibomian gland dysfunction; ATs artificial tears; PFAT's preservative free artificial tears; [place] nuclear sclerotic cataract; PSC posterior subcapsular cataract; ERM epi-retinal membrane; PVD posterior vitreous detachment; RD retinal detachment; DM diabetes mellitus; DR diabetic retinopathy; NPDR non-proliferative diabetic retinopathy; PDR proliferative diabetic retinopathy; CSME clinically significant macular edema; DME diabetic macular edema; dbh dot blot hemorrhages; CWS cotton wool spot; POAG primary open angle glaucoma; C/D cup-to-disc ratio; HVF humphrey visual field; GVF goldmann visual field; OCT  optical coherence tomography; IOP intraocular pressure; BRVO Branch retinal vein occlusion; CRVO central retinal vein occlusion; CRAO central retinal artery occlusion; BRAO branch retinal artery occlusion; RT retinal tear; SB scleral buckle; PPV pars plana vitrectomy; VH Vitreous hemorrhage; PRP panretinal laser photocoagulation; IVK intravitreal kenalog; VMT vitreomacular traction; MH Macular hole;  NVD neovascularization of the disc; NVE neovascularization elsewhere; AREDS age related eye disease study; ARMD age related macular degeneration; POAG primary open angle glaucoma; EBMD epithelial/anterior basement membrane dystrophy; ACIOL anterior chamber intraocular lens; IOL intraocular lens; PCIOL posterior chamber intraocular lens; Phaco/IOL phacoemulsification with intraocular lens placement; [place] photorefractive keratectomy; LASIK laser assisted in situ keratomileusis; HTN hypertension; DM diabetes mellitus; COPD chronic obstructive pulmonary disease

## 2021-02-02 DIAGNOSIS — L0889 Other specified local infections of the skin and subcutaneous tissue: Secondary | ICD-10-CM | POA: Diagnosis not present

## 2021-02-02 DIAGNOSIS — L089 Local infection of the skin and subcutaneous tissue, unspecified: Secondary | ICD-10-CM | POA: Diagnosis not present

## 2021-02-02 DIAGNOSIS — I8311 Varicose veins of right lower extremity with inflammation: Secondary | ICD-10-CM | POA: Diagnosis not present

## 2021-02-02 DIAGNOSIS — S80811A Abrasion, right lower leg, initial encounter: Secondary | ICD-10-CM | POA: Diagnosis not present

## 2021-02-03 DIAGNOSIS — I10 Essential (primary) hypertension: Secondary | ICD-10-CM | POA: Diagnosis not present

## 2021-02-03 DIAGNOSIS — E1169 Type 2 diabetes mellitus with other specified complication: Secondary | ICD-10-CM | POA: Diagnosis not present

## 2021-02-03 DIAGNOSIS — E785 Hyperlipidemia, unspecified: Secondary | ICD-10-CM | POA: Diagnosis not present

## 2021-02-07 ENCOUNTER — Ambulatory Visit (INDEPENDENT_AMBULATORY_CARE_PROVIDER_SITE_OTHER): Payer: Medicare HMO | Admitting: Ophthalmology

## 2021-02-07 ENCOUNTER — Other Ambulatory Visit: Payer: Self-pay

## 2021-02-07 ENCOUNTER — Telehealth: Payer: Self-pay | Admitting: Pharmacy Technician

## 2021-02-07 ENCOUNTER — Encounter (INDEPENDENT_AMBULATORY_CARE_PROVIDER_SITE_OTHER): Payer: Self-pay | Admitting: Ophthalmology

## 2021-02-07 DIAGNOSIS — H35342 Macular cyst, hole, or pseudohole, left eye: Secondary | ICD-10-CM

## 2021-02-07 DIAGNOSIS — H34812 Central retinal vein occlusion, left eye, with macular edema: Secondary | ICD-10-CM | POA: Diagnosis not present

## 2021-02-07 DIAGNOSIS — Z961 Presence of intraocular lens: Secondary | ICD-10-CM | POA: Diagnosis not present

## 2021-02-07 DIAGNOSIS — Z596 Low income: Secondary | ICD-10-CM

## 2021-02-07 DIAGNOSIS — H35372 Puckering of macula, left eye: Secondary | ICD-10-CM

## 2021-02-07 DIAGNOSIS — H35033 Hypertensive retinopathy, bilateral: Secondary | ICD-10-CM

## 2021-02-07 DIAGNOSIS — I10 Essential (primary) hypertension: Secondary | ICD-10-CM

## 2021-02-07 DIAGNOSIS — E119 Type 2 diabetes mellitus without complications: Secondary | ICD-10-CM

## 2021-02-07 MED ORDER — BEVACIZUMAB CHEMO INJECTION 1.25MG/0.05ML SYRINGE FOR KALEIDOSCOPE
1.2500 mg | INTRAVITREAL | Status: AC | PRN
  Administered 2021-02-07: 1.25 mg via INTRAVITREAL

## 2021-02-07 NOTE — Progress Notes (Signed)
[facility])                                            [place] Team    02/07/2021  [place] [facility] 1950-10-11 [id number]                                      Medication Assistance Referral  Referral From: [facility] Embedded RPh Dorthula Perfect   Medication/Company: Danelle Berry / Ralph Leyden Patient application portion:  Mailed Provider application portion: Faxed  to Dr. Parks Ranger Provider address/fax verified via: Office website  Delrick Dehart P. Gualberto Wahlen, [place]  [phone]

## 2021-02-09 DIAGNOSIS — L089 Local infection of the skin and subcutaneous tissue, unspecified: Secondary | ICD-10-CM | POA: Diagnosis not present

## 2021-02-13 ENCOUNTER — Encounter: Payer: Self-pay | Admitting: Podiatry

## 2021-02-13 ENCOUNTER — Other Ambulatory Visit: Payer: Self-pay

## 2021-02-13 ENCOUNTER — Ambulatory Visit: Payer: Medicare HMO | Admitting: Podiatry

## 2021-02-13 VITALS — Temp 98.3°F

## 2021-02-13 DIAGNOSIS — L97512 Non-pressure chronic ulcer of other part of right foot with fat layer exposed: Secondary | ICD-10-CM

## 2021-02-20 ENCOUNTER — Telehealth: Payer: Self-pay | Admitting: Pharmacy Technician

## 2021-02-20 DIAGNOSIS — Z596 Low income: Secondary | ICD-10-CM

## 2021-02-20 NOTE — Progress Notes (Signed)
[facility])                                            [place] Team    02/20/2021  [place] 1950-07-23 [id number]  Received patient and provider portion(s) of patient assistance application(s) for [employer]. Faxed completed application and required documents into [employer].    Iver Fehrenbach P. Brenae Lasecki, [place]  [phone]

## 2021-02-22 ENCOUNTER — Other Ambulatory Visit: Payer: Self-pay

## 2021-02-22 ENCOUNTER — Other Ambulatory Visit: Payer: Self-pay | Admitting: Family Medicine

## 2021-02-22 DIAGNOSIS — E669 Obesity, unspecified: Secondary | ICD-10-CM

## 2021-02-22 DIAGNOSIS — R351 Nocturia: Secondary | ICD-10-CM

## 2021-02-22 DIAGNOSIS — Z0000 Encounter for general adult medical examination without abnormal findings: Secondary | ICD-10-CM

## 2021-02-22 DIAGNOSIS — I10 Essential (primary) hypertension: Secondary | ICD-10-CM | POA: Diagnosis not present

## 2021-02-22 DIAGNOSIS — E1169 Type 2 diabetes mellitus with other specified complication: Secondary | ICD-10-CM | POA: Diagnosis not present

## 2021-02-22 DIAGNOSIS — E66811 Obesity, class 1: Secondary | ICD-10-CM

## 2021-02-22 DIAGNOSIS — E785 Hyperlipidemia, unspecified: Secondary | ICD-10-CM | POA: Diagnosis not present

## 2021-02-23 ENCOUNTER — Other Ambulatory Visit: Payer: Self-pay

## 2021-02-23 LAB — COMPLETE METABOLIC PANEL WITH GFR
AG Ratio: 1.8 (calc) (ref 1.0–2.5)
ALT: 22 U/L (ref 9–46)
AST: 16 U/L (ref 10–35)
Albumin: 4.1 g/dL (ref 3.6–5.1)
Alkaline phosphatase (APISO): 49 U/L (ref 35–144)
BUN: 13 mg/dL (ref 7–25)
CO2: 31 mmol/L (ref 20–32)
Calcium: 9.2 mg/dL (ref 8.6–10.3)
Chloride: 100 mmol/L (ref 98–110)
Creat: 0.88 mg/dL (ref 0.70–1.28)
Globulin: 2.3 g/dL (calc) (ref 1.9–3.7)
Glucose, Bld: 200 mg/dL — ABNORMAL HIGH (ref 65–139)
Potassium: 3.8 mmol/L (ref 3.5–5.3)
Sodium: 137 mmol/L (ref 135–146)
Total Bilirubin: 1 mg/dL (ref 0.2–1.2)
Total Protein: 6.4 g/dL (ref 6.1–8.1)
eGFR: 93 mL/min/{1.73_m2} (ref >=60)

## 2021-02-23 LAB — LIPID PANEL
Cholesterol: 122 mg/dL (ref <200)
HDL: 38 mg/dL — ABNORMAL LOW (ref >=40)
LDL Cholesterol (Calc): 51 mg/dL (calc)
Non-HDL Cholesterol (Calc): 84 mg/dL (calc) (ref <130)
Total CHOL/HDL Ratio: 3.2 (calc) (ref <5.0)
Triglycerides: 315 mg/dL — ABNORMAL HIGH (ref <150)

## 2021-02-23 LAB — CBC WITH DIFFERENTIAL/PLATELET
Absolute Monocytes: 454 cells/uL (ref 200–950)
Basophils Absolute: 50 cells/uL (ref 0–200)
Basophils Relative: 0.8 %
Eosinophils Absolute: 265 cells/uL (ref 15–500)
Eosinophils Relative: 4.2 %
HCT: 43.7 % (ref 38.5–50.0)
Hemoglobin: 15.2 g/dL (ref 13.2–17.1)
Lymphs Abs: 2249 cells/uL (ref 850–3900)
MCH: 33 pg (ref 27.0–33.0)
MCHC: 34.8 g/dL (ref 32.0–36.0)
MCV: 94.8 fL (ref 80.0–100.0)
MPV: 9.8 fL (ref 7.5–12.5)
Monocytes Relative: 7.2 %
Neutro Abs: 3282 cells/uL (ref 1500–7800)
Neutrophils Relative %: 52.1 %
Platelets: 186 10*3/uL (ref 140–400)
RBC: 4.61 10*6/uL (ref 4.20–5.80)
RDW: 13.2 % (ref 11.0–15.0)
Total Lymphocyte: 35.7 %
WBC: 6.3 10*3/uL (ref 3.8–10.8)

## 2021-02-23 LAB — HEMOGLOBIN A1C
Hgb A1c MFr Bld: 7.3 % of total Hgb — ABNORMAL HIGH (ref <5.7)
Mean Plasma Glucose: 163 mg/dL
eAG (mmol/L): 9 mmol/L

## 2021-02-23 LAB — PSA: PSA: 0.32 ng/mL (ref <=4.00)

## 2021-02-25 NOTE — Progress Notes (Signed)
HPI: 71 y.o. male presenting today for new complaint today regarding another wound that is developed to the patient's right third toe.  He does have a history of right third partial toe amputation.  DOS: 11/30/2020.  Patient states that now his fourth toes rubbing against the third toe which may be causing this wound.  Last visit on 12/22/2020 he was doing very well and all wounds had been healed.  He was discharged from the office at that time.  He has noticed this wound developed over the last few weeks.  He immediately made an appointment and presents today for treatment evaluation  Past Medical History:  Diagnosis Date   Anxiety    Arthritis    Coronary artery disease    Hypertensive retinopathy    Myocardial infarction [facility])    Postoperative nausea 11/27/2019    Past Surgical History:  Procedure Laterality Date   CATARACT EXTRACTION     CHOLECYSTECTOMY     CORONARY ANGIOPLASTY WITH STENT PLACEMENT N/A 02/04/2006   EYE SURGERY     FOOT SURGERY Left    DOS 8.1.14 HALLIX IPJ FUSION, SECOND MET OSTEOTOMY W/SCREW, HAM TOE REPAIR 2,,4 , PARTIAL AMP 3RD DIGIT    GALLBLADDER SURGERY     KNEE SURERY Right    LEFT HEART CATH AND CORONARY ANGIOGRAPHY Left 11/17/2019   Procedure: LEFT HEART CATH AND CORONARY ANGIOGRAPHY;  Surgeon: [name], MD;  Location: [place] [facility];  Service: Cardiovascular;  Laterality: Left;   TOTAL KNEE ARTHROPLASTY Right 08/30/2014   Procedure: TOTAL KNEE ARTHROPLASTY;  Surgeon: [name], MD;  Location: [facility];  Service: Orthopedics;  Laterality: Right;    Allergies  Allergen Reactions   Naproxen Swelling and Anaphylaxis     Physical Exam: General: The patient is alert and oriented x3 in no acute distress.  Dermatology: Ulcer noted to the lateral aspect of the right third toe measuring approximately 0.5 x 0.5 x 0.1.  It appears that the wound is being caused by the elongated toenail of the fourth digit.  The fourth digit nail plate is  elongated with hyperkeratosis of the nail  To the noted wound there is no exposed bone muscle tendon ligament or joint.  Granular wound base.  It is somewhat superficial.  Periwound is intact.  There is some moderate serosanguineous drainage.  Vascular: Palpable pedal pulses bilaterally. Capillary refill within normal limits.  Negative for any significant edema or erythema  Neurological: Light touch and protective threshold diminished  Musculoskeletal Exam: History of right third partial toe amputation.  Right second toe amputation.   Assessment: 1.  Ulcer right third toe lateral aspect 2.  Elongated hyperkeratotic nail   Plan of Care:  1. Patient evaluated.  2.  Medically necessary excisional debridement including subcutaneous tissue was performed using a tissue nipper to the ulcer of the right third toe 3.  Mechanical debridement of the toenail to the fourth digit was performed using a nail nipper.  It was debrided back to where it is no longer pressing on the third digit amputation stump 4.  Recommend Betadine ointment daily.  Betadine ointment was provided for the patient 4.  Silicone toe spacer was also provided for the patient to apply to the fourth toe 5.  Return to clinic in 3 weeks     [name], DPM [facility]  Dr. [name], DPM    [address]  [place], [place] [address]                Office [phone]  Fax [phone]

## 2021-02-27 ENCOUNTER — Other Ambulatory Visit: Payer: PPO

## 2021-03-01 NOTE — Progress Notes (Shared)
[facility] Note  03/07/2021     CHIEF COMPLAINT Patient presents for No chief complaint on file.    HISTORY OF PRESENT ILLNESS: Kevin Perry is a 71 y.o. male who presents to the clinic today for:     Referring physician: [name], DO [place],  [place] [address]  HISTORICAL INFORMATION:  Selected notes from the MEDICAL RECORD NUMBER Referred by Dr. [name] for decreased vision and macular edema OS   CURRENT MEDICATIONS: No current outpatient medications on file. (Ophthalmic Drugs)   No current facility-administered medications for this visit. (Ophthalmic Drugs)   Current Outpatient Medications (Other)  Medication Sig   acetaminophen (TYLENOL) 325 MG tablet Take by mouth. (Patient not taking: Reported on 11/15/2020)   amLODipine (NORVASC) 10 MG tablet Take 1 tablet (10 mg total) by mouth daily.   amoxicillin-clavulanate (AUGMENTIN) 875-125 MG tablet Take 1 tablet by mouth 2 (two) times daily.   atorvastatin (LIPITOR) 40 MG tablet Take 1 tablet (40 mg total) by mouth daily.   busPIRone (BUSPAR) 5 MG tablet Take 1 tablet (5 mg total) by mouth 2 (two) times daily as needed (anxiety). (Patient not taking: Reported on 11/15/2020)   escitalopram (LEXAPRO) 20 MG tablet Take 1 tablet (20 mg total) by mouth at bedtime.   folic acid (FOLVITE) 1 MG tablet Take 1 mg by mouth See admin instructions. Take 1 mg daily except skip dose on Wednesdays (methotrexate day)   furosemide (LASIX) 40 MG tablet Take 1 tablet (40 mg total) by mouth daily as needed.   gabapentin (NEURONTIN) 100 MG capsule TAKE 1 CAPSULE BY MOUTH ONCE DAILY INCREASE BY 1 CAPSULE EVERY 2-3 DAYS AS TOLERATED UPTO 3X A DAY OR 3 CAPSULESONCE IN EVENING   Insulin Pen Needle 31G X 5 MM MISC 1 each by Does not apply route daily.   lisinopril (ZESTRIL) 20 MG tablet Take 1 tablet by mouth daily.   loratadine (CLARITIN) 10 MG tablet Take 10 mg by mouth every morning.    metFORMIN (GLUCOPHAGE) 500 MG tablet Take 2 in morning with breakfast and take 1 tab in evening with dinner   methotrexate (RHEUMATREX) 2.5 MG tablet Take 7.5 mg by mouth every Wednesday.   metoprolol tartrate (LOPRESSOR) 25 MG tablet Take 1 tablet (25 mg total) by mouth 2 (two) times daily.   nitroGLYCERIN (NITROSTAT) 0.4 MG SL tablet Place under the tongue.   oxyCODONE-acetaminophen (PERCOCET) 5-325 MG tablet Take 1 tablet by mouth every 4 (four) hours as needed for severe pain.   tamsulosin (FLOMAX) 0.4 MG CAPS capsule Take 1 capsule (0.4 mg total) by mouth daily.   TRULICITY 1.5 UE/4.5WU SOPN INJECT 1.5MG INTO SKIN ONCE WEEKLY (Patient taking differently: Inject 3 mg as directed once a week.)   No current facility-administered medications for this visit. (Other)   REVIEW OF SYSTEMS:    ALLERGIES Allergies  Allergen Reactions   Naproxen Swelling and Anaphylaxis   PAST MEDICAL HISTORY Past Medical History:  Diagnosis Date   Anxiety    Arthritis    Coronary artery disease    Hypertensive retinopathy    Myocardial infarction [facility])    Postoperative nausea 11/27/2019   Past Surgical History:  Procedure Laterality Date   CATARACT EXTRACTION     CHOLECYSTECTOMY     CORONARY ANGIOPLASTY WITH STENT PLACEMENT N/A 02/04/2006   EYE SURGERY     FOOT SURGERY Left    DOS 8.1.14 HALLIX IPJ FUSION, SECOND  MET OSTEOTOMY W/SCREW, HAM TOE REPAIR 2,,4 , PARTIAL AMP 3RD DIGIT    GALLBLADDER SURGERY     KNEE SURERY Right    LEFT HEART CATH AND CORONARY ANGIOGRAPHY Left 11/17/2019   Procedure: LEFT HEART CATH AND CORONARY ANGIOGRAPHY;  Surgeon: [name], MD;  Location: [facility];  Service: Cardiovascular;  Laterality: Left;   TOTAL KNEE ARTHROPLASTY Right 08/30/2014   Procedure: TOTAL KNEE ARTHROPLASTY;  Surgeon: [name], MD;  Location: [facility];  Service: Orthopedics;  Laterality: Right;   FAMILY HISTORY No family history on file.  SOCIAL HISTORY Social History    Tobacco Use   Smoking status: Former    Types: Cigarettes    Quit date: 08/16/2004    Years since quitting: 16.5   Smokeless tobacco: Former   Tobacco comments:    quit 20 plus years  Vaping Use   Vaping Use: Never used  Substance Use Topics   Alcohol use: Yes    Alcohol/week: 2.0 standard drinks    Types: 2 Cans of beer per week    Comment: SOCIAL   Drug use: No       OPHTHALMIC EXAM:  Not recorded    IMAGING AND PROCEDURES  Imaging and Procedures for 03/07/2021          ASSESSMENT/PLAN:  No diagnosis found.  1. Remote CRVO w/ CME, OS  - pt reports progressive decline in vision OS, first noted ~6-7 mos ago  - waited for regularly scheduled f/u with Dr. Ellin Mayhew for evaluation  - pt w/ significant cardiovascular history -- MI in 2008, s/p CABG Oct 2021; pt reports spikes in BP ~3 mos ago (180s/110s)             - FA 10.12.22 shows remote RVO w/ CME.  Cluster of perivascular, perifoveal leakage temporal macula--?Partial petaloid pattern  - s/p IVA OS #1 (10.12.22), #2 (11.09.22), #3 (12.06.22), #4 (01.04.23)  - BCVA 20/50 from 20/30             - OCT 10.12.22 shows interval improvement in temporal cystic changes/edema, ERM w/lamellar hole -- ?converting to full thickness hole, central atrophy  - recommend IVA OS #5 today, 02.01.23  - RBA of procedure discussed, questions answered - informed consent obtained and signed  - see procedure note - f/u 4 wks -- DFE/OCT, possible injection  2,3. Hypertensive retinopathy OU - discussed importance of tight BP control and relation to RVO - monitor   4,5. Epiretinal membrane w/ lamellar hole OS - mild ERM w/ lamellar hole - BCVA 20/50+ -- limited mostly by remote CRVO w/ central atrophy - no indication for surgery at this time - monitor for now  6. Diabetes mellitus, type 2 without retinopathy  - A1c 6.2 on 7.27.22  - OS with scattered IRH/DBH -- mostly from CRVO  - OD w/o heme  - no DME OU - monitor OU  7.  Pseudophakia OU  - s/p CE/IOL OU  - IOL in good position, doing well  - monitor   Ophthalmic Meds Ordered this visit:  No orders of the defined types were placed in this encounter.    No follow-ups on file.  There are no Patient Instructions on file for this visit.  Explained the diagnoses, plan, and follow up with the patient and they expressed understanding.  Patient expressed understanding of the importance of proper follow up care.   This document serves as a record of services personally performed by Gardiner Sleeper, MD,  PhD. It was created on their behalf by Orvan Falconer, an ophthalmic technician. The creation of this record is the provider's dictation and/or activities during the visit.    Electronically signed by: Orvan Falconer, OA, 03/01/21  10:46 AM    Gardiner Sleeper, M.D., Ph.D. Diseases & Surgery of the Retina and Vitreous [facility]  I have reviewed the above documentation for accuracy and completeness, and I agree with the above. Gardiner Sleeper, M.D., Ph.D. 02/07/21 10:46 AM   Abbreviations: M myopia (nearsighted); A astigmatism; H hyperopia (farsighted); P presbyopia; Mrx spectacle prescription;  CTL contact lenses; OD right eye; OS left eye; OU both eyes  XT exotropia; ET esotropia; PEK punctate epithelial keratitis; PEE punctate epithelial erosions; DES dry eye syndrome; MGD meibomian gland dysfunction; ATs artificial tears; PFAT's preservative free artificial tears; [place] nuclear sclerotic cataract; PSC posterior subcapsular cataract; ERM epi-retinal membrane; PVD posterior vitreous detachment; RD retinal detachment; DM diabetes mellitus; DR diabetic retinopathy; NPDR non-proliferative diabetic retinopathy; PDR proliferative diabetic retinopathy; CSME clinically significant macular edema; DME diabetic macular edema; dbh dot blot hemorrhages; CWS cotton wool spot; POAG primary open angle glaucoma; C/D cup-to-disc ratio; HVF humphrey visual  field; GVF goldmann visual field; OCT optical coherence tomography; IOP intraocular pressure; BRVO Branch retinal vein occlusion; CRVO central retinal vein occlusion; CRAO central retinal artery occlusion; BRAO branch retinal artery occlusion; RT retinal tear; SB scleral buckle; PPV pars plana vitrectomy; VH Vitreous hemorrhage; PRP panretinal laser photocoagulation; IVK intravitreal kenalog; VMT vitreomacular traction; MH Macular hole;  NVD neovascularization of the disc; NVE neovascularization elsewhere; AREDS age related eye disease study; ARMD age related macular degeneration; POAG primary open angle glaucoma; EBMD epithelial/anterior basement membrane dystrophy; ACIOL anterior chamber intraocular lens; IOL intraocular lens; PCIOL posterior chamber intraocular lens; Phaco/IOL phacoemulsification with intraocular lens placement; [place] photorefractive keratectomy; LASIK laser assisted in situ keratomileusis; HTN hypertension; DM diabetes mellitus; COPD chronic obstructive pulmonary disease

## 2021-03-06 ENCOUNTER — Other Ambulatory Visit: Payer: Self-pay

## 2021-03-06 ENCOUNTER — Ambulatory Visit: Payer: Medicare HMO | Admitting: Podiatry

## 2021-03-06 ENCOUNTER — Encounter: Payer: PPO | Admitting: Family Medicine

## 2021-03-06 VITALS — Temp 97.7°F

## 2021-03-06 DIAGNOSIS — L97512 Non-pressure chronic ulcer of other part of right foot with fat layer exposed: Secondary | ICD-10-CM | POA: Diagnosis not present

## 2021-03-06 NOTE — Progress Notes (Signed)
° °  HPI: 71 y.o. male presenting today for follow-up evaluation of an ulcer that developed to the lateral aspect of the right third toe.  He states that he has been applying the Betadine ointment and a Band-Aid daily.  He presents for follow-up treatment and evaluation  Past Medical History:  Diagnosis Date   Anxiety    Arthritis    Coronary artery disease    Hypertensive retinopathy    Myocardial infarction [facility])    Postoperative nausea 11/27/2019    Past Surgical History:  Procedure Laterality Date   CATARACT EXTRACTION     CHOLECYSTECTOMY     CORONARY ANGIOPLASTY WITH STENT PLACEMENT N/A 02/04/2006   EYE SURGERY     FOOT SURGERY Left    DOS 8.1.14 HALLIX IPJ FUSION, SECOND MET OSTEOTOMY W/SCREW, HAM TOE REPAIR 2,,4 , PARTIAL AMP 3RD DIGIT    GALLBLADDER SURGERY     KNEE SURERY Right    LEFT HEART CATH AND CORONARY ANGIOGRAPHY Left 11/17/2019   Procedure: LEFT HEART CATH AND CORONARY ANGIOGRAPHY;  Surgeon: [name], MD;  Location: [facility];  Service: Cardiovascular;  Laterality: Left;   TOTAL KNEE ARTHROPLASTY Right 08/30/2014   Procedure: TOTAL KNEE ARTHROPLASTY;  Surgeon: [name], MD;  Location: [facility];  Service: Orthopedics;  Laterality: Right;    Allergies  Allergen Reactions   Naproxen Swelling and Anaphylaxis     Physical Exam: General: The patient is alert and oriented x3 in no acute distress.  Dermatology: Ulcer noted to the lateral aspect of the right third toe measuring approximately 0.5 x 0.6 x 0.1.  Overall the wound appears stable.  To the noted wound there is no exposed bone muscle tendon ligament or joint.  Granular wound base.  It is somewhat superficial.  Periwound is intact.  There is some moderate serosanguineous drainage.  Vascular: Palpable pedal pulses bilaterally. Capillary refill within normal limits.  Negative for any significant edema or erythema  Neurological: Light touch and protective threshold  diminished  Musculoskeletal Exam: History of right third partial toe amputation.  Right second toe amputation.   Assessment: 1.  Ulcer right third toe lateral aspect 2.  Elongated hyperkeratotic nail   Plan of Care:  1. Patient evaluated.  2.  Medically necessary excisional debridement including subcutaneous tissue was performed using a tissue nipper to the ulcer of the right third toe 3.  Discontinue Betadine ointment.  Collagen Prisma Pomagran was applied to the wound with a Band-Aid.  Visional supplies were provided to dress the foot wound daily with the Prisma 4.  Continue wide fitting shoes that do not constrict the toebox area  Return to clinic 3 weeks     [name], DPM [facility]  Dr. [name], DPM    [address], [place] [address]                Office [phone]  Fax [phone]

## 2021-03-07 ENCOUNTER — Encounter (INDEPENDENT_AMBULATORY_CARE_PROVIDER_SITE_OTHER): Payer: PPO | Admitting: Ophthalmology

## 2021-03-07 ENCOUNTER — Other Ambulatory Visit: Payer: Self-pay | Admitting: Family Medicine

## 2021-03-07 DIAGNOSIS — H35342 Macular cyst, hole, or pseudohole, left eye: Secondary | ICD-10-CM

## 2021-03-07 DIAGNOSIS — H34812 Central retinal vein occlusion, left eye, with macular edema: Secondary | ICD-10-CM

## 2021-03-07 DIAGNOSIS — Z961 Presence of intraocular lens: Secondary | ICD-10-CM

## 2021-03-07 DIAGNOSIS — I10 Essential (primary) hypertension: Secondary | ICD-10-CM

## 2021-03-07 DIAGNOSIS — H35033 Hypertensive retinopathy, bilateral: Secondary | ICD-10-CM

## 2021-03-07 DIAGNOSIS — H35372 Puckering of macula, left eye: Secondary | ICD-10-CM

## 2021-03-07 DIAGNOSIS — E119 Type 2 diabetes mellitus without complications: Secondary | ICD-10-CM

## 2021-03-07 NOTE — Telephone Encounter (Signed)
Has refill left at same pharm Requested Prescriptions  Pending Prescriptions Disp Refills   amLODipine (NORVASC) 10 MG tablet [Pharmacy Med Name: AMLODIPINE BESYLATE 10 MG TAB] 90 tablet 1    Sig: TAKE 1 TABLET BY MOUTH ONCE DAILY     Cardiovascular: Calcium Channel Blockers 2 Failed - 03/07/2021  1:18 PM      Failed - Last BP in normal range    BP Readings from Last 1 Encounters:  01/24/21 (!) 150/80         Passed - Last Heart Rate in normal range    Pulse Readings from Last 1 Encounters:  01/24/21 66         Passed - Valid encounter within last 6 months    Recent Outpatient Visits          1 month ago Right leg pain   [place], DO   3 months ago Type 2 diabetes mellitus with other specified complication, without long-term current use of insulin ([place])   [place], DO   6 months ago Type 2 diabetes mellitus with other specified complication, without long-term current use of insulin [facility])   [place], DO   10 months ago Diabetic polyneuropathy associated with type 2 diabetes mellitus ([place])   [facility], [name], DO   1 year ago Annual physical exam   [place], [name], DO      Future Appointments            Tomorrow [facility], [facility]

## 2021-03-08 ENCOUNTER — Encounter: Payer: Self-pay | Admitting: Family Medicine

## 2021-03-08 ENCOUNTER — Other Ambulatory Visit: Payer: Self-pay

## 2021-03-08 ENCOUNTER — Ambulatory Visit (INDEPENDENT_AMBULATORY_CARE_PROVIDER_SITE_OTHER): Payer: Medicare HMO | Admitting: Family Medicine

## 2021-03-08 VITALS — BP 138/84 | HR 73 | Ht 75.0 in | Wt 268.8 lb

## 2021-03-08 DIAGNOSIS — E1142 Type 2 diabetes mellitus with diabetic polyneuropathy: Secondary | ICD-10-CM | POA: Diagnosis not present

## 2021-03-08 DIAGNOSIS — R351 Nocturia: Secondary | ICD-10-CM | POA: Diagnosis not present

## 2021-03-08 DIAGNOSIS — L089 Local infection of the skin and subcutaneous tissue, unspecified: Secondary | ICD-10-CM | POA: Diagnosis not present

## 2021-03-08 DIAGNOSIS — E1169 Type 2 diabetes mellitus with other specified complication: Secondary | ICD-10-CM | POA: Diagnosis not present

## 2021-03-08 DIAGNOSIS — Z0000 Encounter for general adult medical examination without abnormal findings: Secondary | ICD-10-CM | POA: Diagnosis not present

## 2021-03-08 DIAGNOSIS — E113393 Type 2 diabetes mellitus with moderate nonproliferative diabetic retinopathy without macular edema, bilateral: Secondary | ICD-10-CM

## 2021-03-08 DIAGNOSIS — F32A Depression, unspecified: Secondary | ICD-10-CM | POA: Diagnosis not present

## 2021-03-08 DIAGNOSIS — E669 Obesity, unspecified: Secondary | ICD-10-CM

## 2021-03-08 DIAGNOSIS — E785 Hyperlipidemia, unspecified: Secondary | ICD-10-CM

## 2021-03-08 DIAGNOSIS — F325 Major depressive disorder, single episode, in full remission: Secondary | ICD-10-CM

## 2021-03-08 DIAGNOSIS — I480 Paroxysmal atrial fibrillation: Secondary | ICD-10-CM

## 2021-03-08 DIAGNOSIS — N401 Enlarged prostate with lower urinary tract symptoms: Secondary | ICD-10-CM | POA: Diagnosis not present

## 2021-03-08 DIAGNOSIS — Z89421 Acquired absence of other right toe(s): Secondary | ICD-10-CM | POA: Diagnosis not present

## 2021-03-08 DIAGNOSIS — N138 Other obstructive and reflux uropathy: Secondary | ICD-10-CM

## 2021-03-08 DIAGNOSIS — E66811 Obesity, class 1: Secondary | ICD-10-CM

## 2021-03-08 DIAGNOSIS — I10 Essential (primary) hypertension: Secondary | ICD-10-CM

## 2021-03-08 MED ORDER — ESCITALOPRAM OXALATE 20 MG PO TABS: 20.0000 mg | ORAL_TABLET | Freq: Every day | ORAL | 3 refills | Status: DC

## 2021-03-08 MED ORDER — TAMSULOSIN HCL 0.4 MG PO CAPS: 0.4000 mg | ORAL_CAPSULE | Freq: Every day | ORAL | 3 refills | Status: DC

## 2021-03-08 MED ORDER — METFORMIN HCL 500 MG PO TABS: ORAL_TABLET | ORAL | 3 refills | Status: DC

## 2021-03-08 MED ORDER — GABAPENTIN 100 MG PO CAPS: 100.0000 mg | ORAL_CAPSULE | Freq: Two times a day (BID) | ORAL | 3 refills | Status: DC

## 2021-03-08 MED ORDER — AMLODIPINE BESYLATE 10 MG PO TABS: 10.0000 mg | ORAL_TABLET | Freq: Every day | ORAL | 3 refills | Status: DC

## 2021-03-08 MED ORDER — ATORVASTATIN CALCIUM 40 MG PO TABS: 40.0000 mg | ORAL_TABLET | Freq: Every day | ORAL | 3 refills | Status: DC

## 2021-03-08 NOTE — Progress Notes (Signed)
Subjective:    Patient ID: Kevin Perry, male    DOB: 1950-02-18, 71 y.o.   MRN: [id number]  Kevin Perry is a 71 y.o. male presenting on 03/08/2021 for Annual Exam   HPI  Here for Annual Physical and Lab Review.  CHRONIC DM, Type 2:  Doing well. A1c 7.3, some increase but within range On [employer] PAP program for Trulicity 1.6BW x 2 now for dose 53m - awaiting newest shipment  Meds: Trulicity 14.6KZ x2 = 337mweekly, Metformin 2AM and 1 PM Off Glipizide Reports good compliance. Tolerating well w/o side-effects Currently on ACEi Lifestyle: - Diet (trying to improve diet reduce carb intake) - Exercise (walk regularly) Denies hypoglycemia, polyuria, visual changes, numbness or tingling.   PMH Central Retinal Vein Occlusion, Left w/ macular edema  CHRONIC HTN: Paroxysmal AFib Followed by Cardiology KeJefm BryantDr FaUbaldo Glassingetiring will meet new doctor soon Current Meds - Amlodipine 36m54maily, Furosemide 33m136miliy, Lisinopril 20mg36mly, Metoprolol XL 50mg 57my   Reports good compliance, took meds today. Tolerating well, w/o complaints. Denies CP, dyspnea, HA, edema, dizziness / lightheadedness   Hx R 2nd toe removal due to arthritis R 3rd toe removal due to ulceration, per Dr Evans Amalia Haileytry    Anxiety Depression in remission Improved on med.   Health Maintenance: Declines shingles vaccine.  Depression screen PHQ 2/[facility]/03/2021 12/04/2020 08/30/2020  Decreased Interest 0 0 0  Down, Depressed, Hopeless 0 0 0  PHQ - 2 Score 0 0 0  Altered sleeping 0 - 0  Tired, decreased energy 0 - 0  Change in appetite 0 - 0  Feeling bad or failure about yourself  0 - 0  Trouble concentrating 0 - 0  Moving slowly or fidgety/restless 0 - 0  Suicidal thoughts 0 - 0  PHQ-9 Score 0 - 0  Difficult doing work/chores Not difficult at all - Not difficult at all  Some recent data might be hidden    Past Medical History:  Diagnosis Date   Anxiety    Arthritis    Coronary artery  disease    Hypertensive retinopathy    [facility]   Past Surgical History:  Procedure Laterality Date   CATARACT EXTRACTION     CHOLECYSTECTOMY     CORONARY ANGIOPLASTY WITH STENT PLACEMENT N/A 02/04/2006   EYE SURGERY     FOOT SURGERY Left    DOS 8.1.14 HALLIX IPJ FUSION, SECOND MET OSTEOTOMY W/SCREW, HAM TOE REPAIR 2,,4 , PARTIAL AMP 3RD DIGIT    GALLBLADDER SURGERY     KNEE SURERY Right    LEFT HEART CATH AND CORONARY ANGIOGRAPHY Left 11/17/2019   Procedure: LEFT HEART CATH AND CORONARY ANGIOGRAPHY;  Surgeon: Fath, [name] [facility];  Service: Cardiovascular;  Laterality: Left;   TOTAL KNEE ARTHROPLASTY Right 08/30/2014   Procedure: TOTAL KNEE ARTHROPLASTY;  Surgeon: MichaeHessie Knows Location: [facility];  Service: Orthopedics;  Laterality: Right;   Social History   Socioeconomic History   Marital status: Married    Spouse name: Not on file   Number of children: Not on file   Years of education: Not on file   Highest education level: Not on file  Occupational History   Not on file  Tobacco Use   Smoking status: Former    Types: Cigarettes    Quit date: 08/16/2004    Years since quitting: 16.5   Smokeless tobacco: Former  Tobacco comments:    quit 20 plus years  Vaping Use   Vaping Use: Never used  Substance and Sexual Activity   Alcohol use: Yes    Alcohol/week: 2.0 standard drinks    Types: 2 Cans of beer per week    Comment: SOCIAL   Drug use: No   Sexual activity: Not on file  Other Topics Concern   Not on file  Social History Narrative   Not on file   Social Determinants of Health   Financial Resource Strain: Not on file  Food Insecurity: Not on file  Transportation Needs: Not on file  Physical Activity: Not on file  Stress: Not on file  Social Connections: Not on file  Intimate Partner Violence: Not on file   History reviewed. No pertinent family history. Current  Outpatient Medications on File Prior to Visit  Medication Sig   folic acid (FOLVITE) 1 MG tablet Take 1 mg by mouth See admin instructions. Take 1 mg daily except skip dose on Wednesdays (methotrexate day)   furosemide (LASIX) 40 MG tablet Take 1 tablet (40 mg total) by mouth daily as needed.   Insulin Pen Needle 31G X 5 MM MISC 1 each by Does not apply route daily.   lisinopril (ZESTRIL) 20 MG tablet Take 1 tablet by mouth daily.   loratadine (CLARITIN) 10 MG tablet Take 10 mg by mouth every morning.   methotrexate (RHEUMATREX) 2.5 MG tablet Take 7.5 mg by mouth every Wednesday.   metoprolol tartrate (LOPRESSOR) 25 MG tablet Take 1 tablet (25 mg total) by mouth 2 (two) times daily.   nitroGLYCERIN (NITROSTAT) 0.4 MG SL tablet Place under the tongue.   TRULICITY 1.5 QI/3.4VQ SOPN INJECT 1.5MG INTO SKIN ONCE WEEKLY (Patient taking differently: Inject 3 mg as directed once a week.)   acetaminophen (TYLENOL) 325 MG tablet Take by mouth. (Patient not taking: Reported on 11/15/2020)   No current facility-administered medications on file prior to visit.    Review of Systems  Constitutional:  Negative for activity change, appetite change, chills, diaphoresis, fatigue and fever.  HENT:  Negative for congestion and hearing loss.   Eyes:  Negative for visual disturbance.  Respiratory:  Negative for cough, chest tightness, shortness of breath and wheezing.   Cardiovascular:  Negative for chest pain, palpitations and leg swelling.  Gastrointestinal:  Negative for abdominal pain, constipation, diarrhea, nausea and vomiting.  Genitourinary:  Negative for dysuria, frequency and hematuria.  Musculoskeletal:  Negative for arthralgias and neck pain.  Skin:  Negative for rash.  Neurological:  Negative for dizziness, weakness, light-headedness, numbness and headaches.  Hematological:  Negative for adenopathy.  Psychiatric/Behavioral:  Negative for behavioral problems, dysphoric mood and sleep disturbance.    Per HPI unless specifically indicated above      Objective:    BP 138/84 (BP Location: Left Arm, Cuff Size: Normal)    Pulse 73    Ht '6\' 3"'  (1.905 m)    Wt 268 lb 12.8 oz (121.9 kg)    SpO2 97%    BMI 33.60 kg/m   Wt Readings from Last 3 Encounters:  03/08/21 268 lb 12.8 oz (121.9 kg)  01/24/21 275 lb 12.8 oz (125.1 kg)  12/04/20 268 lb 3.2 oz (121.7 kg)    Physical Exam Vitals and nursing note reviewed.  Constitutional:      General: He is not in acute distress.    Appearance: He is well-developed. He is not diaphoretic.     Comments: Well-appearing, comfortable, cooperative  HENT:     Head: Normocephalic and atraumatic.  Eyes:     General:        Right eye: No discharge.        Left eye: No discharge.     Conjunctiva/sclera: Conjunctivae normal.     Pupils: Pupils are equal, round, and reactive to light.  Neck:     Thyroid: No thyromegaly.     Vascular: No carotid bruit.  Cardiovascular:     Rate and Rhythm: Normal rate and regular rhythm.     Pulses: Normal pulses.     Heart sounds: Normal heart sounds. No murmur heard. Pulmonary:     Effort: Pulmonary effort is normal. No respiratory distress.     Breath sounds: Normal breath sounds. No wheezing or rales.  Abdominal:     General: Bowel sounds are normal. There is no distension.     Palpations: Abdomen is soft. There is no mass.     Tenderness: There is no abdominal tenderness.  Musculoskeletal:        General: No tenderness. Normal range of motion.     Cervical back: Normal range of motion and neck supple.     Right lower leg: No edema.     Left lower leg: No edema.     Comments: Upper / Lower Extremities: - Normal muscle tone, strength bilateral upper extremities 5/5, lower extremities 5/5  S/p R 2nd and 3rd Toe amputation, healed.  Lymphadenopathy:     Cervical: No cervical adenopathy.  Skin:    General: Skin is warm and dry.     Findings: No erythema or rash.  Neurological:     Mental Status: He is  alert and oriented to person, place, and time.     Comments: Distal sensation intact to light touch all extremities  Psychiatric:        Mood and Affect: Mood normal.        Behavior: Behavior normal.        Thought Content: Thought content normal.     Comments: Well groomed, good eye contact, normal speech and thoughts     Results for orders placed or performed in visit on 02/22/21  COMPLETE METABOLIC PANEL WITH GFR  Result Value Ref Range   Glucose, Bld 200 (H) 65 - 139 mg/dL   BUN 13 7 - 25 mg/dL   Creat 0.88 0.70 - 1.28 mg/dL   eGFR 93 > OR = 60 mL/min/1.30m   BUN/Creatinine Ratio NOT APPLICABLE 6 - 22 (calc)   Sodium 137 135 - 146 mmol/L   Potassium 3.8 3.5 - 5.3 mmol/L   Chloride 100 98 - 110 mmol/L   CO2 31 20 - 32 mmol/L   Calcium 9.2 8.6 - 10.3 mg/dL   Total Protein 6.4 6.1 - 8.1 g/dL   Albumin 4.1 3.6 - 5.1 g/dL   Globulin 2.3 1.9 - 3.7 g/dL (calc)   AG Ratio 1.8 1.0 - 2.5 (calc)   Total Bilirubin 1.0 0.2 - 1.2 mg/dL   Alkaline phosphatase (APISO) 49 35 - 144 U/L   AST 16 10 - 35 U/L   ALT 22 9 - 46 U/L  Lipid panel  Result Value Ref Range   Cholesterol 122 <200 mg/dL   HDL 38 (L) > OR = 40 mg/dL   Triglycerides 315 (H) <150 mg/dL   LDL Cholesterol (Calc) 51 mg/dL (calc)   Total CHOL/HDL Ratio 3.2 <5.0 (calc)   Non-HDL Cholesterol (Calc) 84 <130 mg/dL (calc)  CBC with Differential/Platelet  Result Value Ref Range   WBC 6.3 3.8 - 10.8 Thousand/uL   RBC 4.61 4.20 - 5.80 Million/uL   Hemoglobin 15.2 13.2 - 17.1 g/dL   HCT 43.7 38.5 - 50.0 %   MCV 94.8 80.0 - 100.0 fL   MCH 33.0 27.0 - 33.0 pg   MCHC 34.8 32.0 - 36.0 g/dL   RDW 13.2 11.0 - 15.0 %   Platelets 186 140 - 400 Thousand/uL   MPV 9.8 7.5 - 12.5 fL   Neutro Abs 3,282 1,500 - 7,800 cells/uL   Lymphs Abs 2,249 850 - 3,900 cells/uL   Absolute Monocytes 454 200 - 950 cells/uL   Eosinophils Absolute 265 15 - 500 cells/uL   Basophils Absolute 50 0 - 200 cells/uL   Neutrophils Relative % 52.1 %    Total Lymphocyte 35.7 %   Monocytes Relative 7.2 %   Eosinophils Relative 4.2 %   Basophils Relative 0.8 %  Hemoglobin A1c  Result Value Ref Range   Hgb A1c MFr Bld 7.3 (H) <5.7 % of total Hgb   Mean Plasma Glucose 163 mg/dL   eAG (mmol/L) 9.0 mmol/L  PSA  Result Value Ref Range   PSA 0.32 < OR = 4.00 ng/mL      Assessment & Plan:   Problem List Items Addressed This Visit     Type 2 diabetes mellitus with other specified complication (HCC)    Q4O 7.3 elevated but within range Complicated by Hyperglycemia, Hyperlipidemia, Mood, Cataract (s/p surgery), DM Retinopathy - increase risk of cardiovascular abnormality w/ known CAD - Prior Meds: Onglyza (ins/cost), Metformin XR 750 (GI intolerance) Off Glipizide  Plan:  1. Continue increased Metformin IR 575m x 2 in AM / x 1 in PM - CONTINUE current meds - Trulicity 19.6EXx2 = 390mweekly [place] inj - awaiting PAP shipment of 84m30mose - Counseling on risk hypoglycemia on sulfonylurea  We discussed option in future of increase Trulicity to 3.05.2WUekly and reduce or Stop Glipizide - may do this in future if indicated. His Trulicity is from PAP from manufacturer would need to submit change of dose form. Reconsider in 3 months.  2. Encourage improved lifestyle - low carb, low sugar diet, reduce portion size, goal for regular balanced 3 x daily meal instead of skipping lunch, continue improving regular exercise 3. Check CBG, bring log to next visit for review - check 3x weekly 4. Continue ASA, ACEi, Statin      Relevant Medications   atorvastatin (LIPITOR) 40 MG tablet   metFORMIN (GLUCOPHAGE) 500 MG tablet   Paroxysmal atrial fibrillation (HCC)    History of PAF Followed by Cardiology Dr FatUbaldo Glassingas had negative holter Taken off anticoag / amio      Relevant Medications   amLODipine (NORVASC) 10 MG tablet   atorvastatin (LIPITOR) 40 MG tablet   Obesity (BMI 30.0-34.9)   Moderate non-proliferative diabetic retinopathy (HCC)    Followed  by Ophtho      Relevant Medications   atorvastatin (LIPITOR) 40 MG tablet   metFORMIN (GLUCOPHAGE) 500 MG tablet   Major depression in remission (HCC)    Stable, controlled in remission on SSRI Continue current therapy      Relevant Medications   escitalopram (LEXAPRO) 20 MG tablet   Hyperlipidemia associated with type 2 diabetes mellitus ([place]  Well controlled cholesterol on statin and lifestyle - now has some high TG still Calculated ASCVD 10 yr risk score elevated  Plan: 1. Continue current meds -  Rosuvastatin 44m daily 2. Continue ASA 82mfor primary ASCVD risk reduction 3. Encourage improved lifestyle - low carb/cholesterol, reduce portion size, continue improving regular exercise      Relevant Medications   amLODipine (NORVASC) 10 MG tablet   atorvastatin (LIPITOR) 40 MG tablet   metFORMIN (GLUCOPHAGE) 500 MG tablet   History of complete ray amputation of second toe of right foot (HCC)   Essential hypertension    Improved BP - Home BP readings normal previously History of MI / CAD. Peripheral edema   Plan:  Continue current BP regimen - Amlodipine 1032maily, Metoprolol 25 BID, Furosemide 33m63mily, Lisinopril 20mg13mly 2. Encourage improved lifestyle - low sodium diet, regular exercise 3. Continue monitor BP outside office, bring readings to next visit, if persistently >140/90 or new symptoms notify office sooner       Relevant Medications   amLODipine (NORVASC) 10 MG tablet   atorvastatin (LIPITOR) 40 MG tablet   Other Visit Diagnoses     Annual physical exam    -  Primary   Nocturia       Depression, unspecified depression type       Relevant Medications   escitalopram (LEXAPRO) 20 MG tablet   Diabetic polyneuropathy associated with type 2 diabetes mellitus (HCC)       Relevant Medications   atorvastatin (LIPITOR) 40 MG tablet   escitalopram (LEXAPRO) 20 MG tablet   gabapentin (NEURONTIN) 100 MG capsule   metFORMIN (GLUCOPHAGE) 500 MG tablet    BPH with obstruction/lower urinary tract symptoms       Relevant Medications   tamsulosin (FLOMAX) 0.4 MG CAPS capsule       Updated Health Maintenance information Reviewed recent lab results with patient Encouraged improvement to lifestyle with diet and exercise Goal of weight loss   Meds ordered this encounter  Medications   amLODipine (NORVASC) 10 MG tablet    Sig: Take 1 tablet (10 mg total) by mouth daily.    Dispense:  90 tablet    Refill:  3   atorvastatin (LIPITOR) 40 MG tablet    Sig: Take 1 tablet (40 mg total) by mouth daily.    Dispense:  90 tablet    Refill:  3   escitalopram (LEXAPRO) 20 MG tablet    Sig: Take 1 tablet (20 mg total) by mouth at bedtime.    Dispense:  90 tablet    Refill:  3    Add refills on file   gabapentin (NEURONTIN) 100 MG capsule    Sig: Take 1 capsule (100 mg total) by mouth 2 (two) times daily.    Dispense:  180 capsule    Refill:  3   metFORMIN (GLUCOPHAGE) 500 MG tablet    Sig: Take 2 in morning with breakfast and take 1 tab in evening with dinner    Dispense:  270 tablet    Refill:  3   tamsulosin (FLOMAX) 0.4 MG CAPS capsule    Sig: Take 1 capsule (0.4 mg total) by mouth daily.    Dispense:  90 capsule    Refill:  3    Add refills on file      Follow up plan: Return in about 6 months (around 09/05/2021) for 6 month follow-up DM A1c, HTN.  AlexaNobie PutnamSouthStonewallp 03/08/2021, 9:37 AM

## 2021-03-08 NOTE — Assessment & Plan Note (Signed)
History of PAF Followed by Cardiology Dr Ubaldo Glassing, has had negative holter Taken off anticoag / amio

## 2021-03-08 NOTE — Assessment & Plan Note (Signed)
Well controlled cholesterol on statin and lifestyle - now has some high TG still Calculated ASCVD 10 yr risk score elevated  Plan: 1. Continue current meds - Rosuvastatin 10mg  daily 2. Continue ASA 81mg  for primary ASCVD risk reduction 3. Encourage improved lifestyle - low carb/cholesterol, reduce portion size, continue improving regular exercise

## 2021-03-08 NOTE — Assessment & Plan Note (Signed)
Stable, controlled in remission on SSRI Continue current therapy

## 2021-03-08 NOTE — Assessment & Plan Note (Signed)
A1c 7.3 elevated but within range Complicated by Hyperglycemia, Hyperlipidemia, Mood, Cataract (s/p surgery), DM Retinopathy - increase risk of cardiovascular abnormality w/ known CAD - Prior Meds: Onglyza (ins/cost), Metformin XR 750 (GI intolerance) Off Glipizide  Plan:  1. Continue increased Metformin IR 500mg  x 2 in AM / x 1 in PM - CONTINUE current meds - Trulicity 1.5mg  x2 = 3mg  weekly [place] inj - awaiting PAP shipment of 3mg  dose - Counseling on risk hypoglycemia on sulfonylurea  We discussed option in future of increase Trulicity to 3.0mg  weekly and reduce or Stop Glipizide - may do this in future if indicated. His Trulicity is from PAP from manufacturer would need to submit change of dose form. Reconsider in 3 months.  2. Encourage improved lifestyle - low carb, low sugar diet, reduce portion size, goal for regular balanced 3 x daily meal instead of skipping lunch, continue improving regular exercise 3. Check CBG, bring log to next visit for review - check 3x weekly 4. Continue ASA, ACEi, Statin

## 2021-03-08 NOTE — Assessment & Plan Note (Signed)
Followed by Kevin Perry

## 2021-03-08 NOTE — Patient Instructions (Addendum)
Thank you for coming to the office today.  All meds refilled  Stay tuned for Trulicity 3mg  dose in the mail.  Keep up the great work  Okay with A1c 7 range.   Please schedule a Follow-up Appointment to: Return in about 6 months (around 09/05/2021) for 6 month follow-up DM A1c, HTN.  If you have any other questions or concerns, please feel free to call the office or send a message through [place]. You may also schedule an earlier appointment if necessary.  Additionally, you may be receiving a survey about your experience at our office within a few days to 1 week by e-mail or mail. We value your feedback.  [name], DO [place]

## 2021-03-08 NOTE — Assessment & Plan Note (Signed)
Improved BP - Home BP readings normal previously History of MI / CAD. Peripheral edema   Plan:  Continue current BP regimen - Amlodipine 10mg  daily, Metoprolol 25 BID, Furosemide 40mg  daily, Lisinopril 20mg  daily 2. Encourage improved lifestyle - low sodium diet, regular exercise 3. Continue monitor BP outside office, bring readings to next visit, if persistently >140/90 or new symptoms notify office sooner

## 2021-03-12 ENCOUNTER — Telehealth: Payer: PPO

## 2021-03-12 ENCOUNTER — Telehealth: Payer: Self-pay | Admitting: Pharmacist

## 2021-03-12 NOTE — Telephone Encounter (Signed)
°  Chronic Care Management   Outreach Note  03/12/2021 Name: Kevin Perry MRN: [id number] DOB: 04/29/1950  Referred by: [name], DO Reason for referral : No chief complaint on file.  Was unable to reach patient via telephone today and have left HIPAA compliant voicemail asking patient to return my call.    Follow Up Plan: Will collaborate with Care Guide to outreach to schedule follow up with me  Wallace Cullens, PharmD, [place] [facility] [phone]

## 2021-03-22 ENCOUNTER — Telehealth: Payer: Self-pay | Admitting: Pharmacy Technician

## 2021-03-22 DIAGNOSIS — Z596 Low income: Secondary | ICD-10-CM

## 2021-03-22 NOTE — Progress Notes (Signed)
[facility])                                            [place] Team    03/22/2021  [place] [birth year] [phone]  Care coordination call placed to [place] in regard to Trulicity application.  Spoke to Mardene Celeste who informs patient is APPROVED 03/09/21-02/03/22. She informs medication will auto fill and ship to his home based on last fill date in 2022 and going forward in 2023.  Zoii Florer P. Bernise Sylvain, [place]  [phone]

## 2021-03-27 ENCOUNTER — Ambulatory Visit: Payer: Medicare HMO | Admitting: Podiatry

## 2021-03-28 ENCOUNTER — Ambulatory Visit (INDEPENDENT_AMBULATORY_CARE_PROVIDER_SITE_OTHER): Payer: Medicare HMO | Admitting: Pharmacist

## 2021-03-28 DIAGNOSIS — E785 Hyperlipidemia, unspecified: Secondary | ICD-10-CM

## 2021-03-28 DIAGNOSIS — E1169 Type 2 diabetes mellitus with other specified complication: Secondary | ICD-10-CM

## 2021-03-28 DIAGNOSIS — I10 Essential (primary) hypertension: Secondary | ICD-10-CM

## 2021-03-28 NOTE — Patient Instructions (Signed)
Visit Information  Thank you for taking time to visit with me today. Please don't hesitate to contact me if I can be of assistance to you before our next scheduled telephone appointment.  Following are the goals we discussed today:   Goals Addressed             This Visit's Progress    Pharmacy Goals       Our goal bad cholesterol, or LDL, is less than 70 . This is why it is important to continue taking your atorvastatin   Please check your home blood pressure, keep a log of the results and have this to review during our next appointment.    Feel free to call me with any questions or concerns. I look forward to our next call!  Wallace Cullens, PharmD, [place] [phone]         Our next appointment is by telephone on 04/25/2021 at 11:15 am  Please call the care guide team at [phone] if you need to cancel or reschedule your appointment.    Patient verbalizes understanding of instructions and care plan provided today and agrees to view in [place]. Active MyChart status confirmed with patient.

## 2021-03-28 NOTE — Chronic Care Management (AMB) (Signed)
Chronic Care Management CCM Pharmacy Note  03/28/2021 Name:  Kevin Perry MRN:  [id number] DOB:  Nov 05, 1950   Subjective: Kevin Perry is an 71 y.o. year old male who is a primary patient of Kevin Hauser, DO.  The CCM team was consulted for assistance with disease management and care coordination needs.    Engaged with patient by telephone for follow up visit for pharmacy case management and/or care coordination services.   Objective:  Medications Reviewed Today     Reviewed by Kevin Perry, RPH-CPP (Pharmacist) on 03/28/21 at 1157  Med List Status: <None>   Medication Order Taking? Sig Documenting Provider Last Dose Status Informant  acetaminophen (TYLENOL) 325 MG tablet [id number]  Take by mouth.  Patient not taking: Reported on 11/15/2020   [provider]  Active   amLODipine (NORVASC) 10 MG tablet [id number] Yes Take 1 tablet (10 mg total) by mouth daily. Kevin Hauser, DO Taking Active   atorvastatin (LIPITOR) 40 MG tablet [id number] Yes Take 1 tablet (40 mg total) by mouth daily. Kevin Hauser, DO Taking Active   Dulaglutide (TRULICITY) 3 TU/8.8KC [name] [id number] Yes Inject 3 mg into the skin once a week. [provider] Taking Active   escitalopram (LEXAPRO) 20 MG tablet [id number]  Take 1 tablet (20 mg total) by mouth at bedtime. Karamalegos, [name], DO  Active   folic acid (FOLVITE) 1 MG tablet [id number]  Take 1 mg by mouth See admin instructions. Take 1 mg daily except skip dose on Wednesdays (methotrexate day) [provider]  Active Self  furosemide (LASIX) 40 MG tablet [id number] Yes Take 1 tablet (40 mg total) by mouth daily as needed. Kevin Hauser, DO Taking Active   gabapentin (NEURONTIN) 100 MG capsule [id number]  Take 1 capsule (100 mg total) by mouth 2 (two) times daily. Karamalegos, [name], DO  Active   lisinopril (ZESTRIL) 20 MG tablet [id number] Yes Take 1 tablet by mouth  daily. [provider] Taking Active   loratadine (CLARITIN) 10 MG tablet [id number]  Take 10 mg by mouth every morning. [provider]  Active Self  metFORMIN (GLUCOPHAGE) 500 MG tablet [id number] Yes Take 2 in morning with breakfast and take 1 tab in evening with dinner Kevin Hauser, DO Taking Active   methotrexate (RHEUMATREX) 2.5 MG tablet [id number]  Take 7.5 mg by mouth every Wednesday. [provider]  Active Self  metoprolol tartrate (LOPRESSOR) 25 MG tablet [id number] Yes Take 1 tablet (25 mg total) by mouth 2 (two) times daily. Kevin Hauser, DO Taking Active   nitroGLYCERIN (NITROSTAT) 0.4 MG SL tablet [id number]  Place under the tongue. [provider]  Active   tamsulosin (FLOMAX) 0.4 MG CAPS capsule [id number]  Take 1 capsule (0.4 mg total) by mouth daily. Kevin Hauser, DO  Active             Pertinent Labs:  Lab Results  Component Value Date   HGBA1C 7.3 (H) 02/22/2021   Lab Results  Component Value Date   CHOL 122 02/22/2021   HDL 38 (L) 02/22/2021   LDLCALC 51 02/22/2021   TRIG 315 (H) 02/22/2021   CHOLHDL 3.2 02/22/2021   Lab Results  Component Value Date   CREATININE 0.88 02/22/2021   BUN 13 02/22/2021   NA 137 02/22/2021   K 3.8 02/22/2021   CL 100 02/22/2021   CO2 31 02/22/2021   BP Readings from Last 3 Encounters:  03/08/21 138/84  01/24/21 (!) 150/80  12/04/20 (!) 169/79   Pulse Readings from Last 3 Encounters:  03/08/21 73  01/24/21 66  12/04/20 (!) 57     SDOH:  (Social Determinants of Health) assessments and interventions performed:    [place]  Review of patient past medical history, allergies, medications, health status, including review of consultants reports, laboratory and other test data, was performed as part of comprehensive evaluation and provision of chronic care management services.   Care Plan : PharmD - Med Assistance/Management  Updates made by Kevin Perry, RPH-CPP since 03/28/2021 12:00 AM     Problem: Disease Progression      Long-Range Goal: Disease Progression Prevented or Minimized   Start Date: 03/27/2020  Expected End Date: 06/25/2020  This Visit's Progress: On track  Recent Progress: On track  Priority: High  Note:   Current Barriers:  Unable to independently afford treatment regimen Patient APPROVED for Trulicity from [facility] Patient Assistance Program 05/03/20-02/03/21  Pharmacist Clinical Goal(s):  Over the next 90 days, patient will verbalize ability to afford treatment regimen through collaboration with PharmD and provider.   Interventions: 1:1 collaboration with Kevin Hauser, DO regarding development and update of comprehensive plan of care as evidenced by provider attestation and co-signature Inter-disciplinary care team collaboration (see longitudinal plan of care) Perform chart review. Patient seen for Office Visit with PCP on 03/08/2021 for annual physical exam Today patient reports he is doing well  Medication Assistance: Collaborated with [facility] CPhT for aid to patient with re-enrollment for Trulicity through [place] for 2023 calendar year Received a message from [place] patient [place] 03/09/21-02/03/22 for re-enrollment in [place] patient assistance program for Trulicity and per program representative, medication was delivered to patient on 2/14 and setup to auto-ship for refills Today patient confirms received shipment of medication from program  T2DM: Current treatment: Metformin IR 500mg  x 2 in AM / x 1 in PM Trulicity 3 mg weekly Reports tolerating well Previous therapies: glipizide Reports recent morning fasting readings ranging 140-160 Denies symptoms of hypoglycemia Encourage patient to have regular well-balanced meals throughout the day and limit carbohydrate portion sizes Exercise: walking ~12 minutes x 6 days/week Reports plans to become more active as weather  improving Encourage patient to increase walking/physical activity eventually to target goal of 150 minutes/week  Hypertension: Current treatment: Amlodipine 10mg  daily Furosemide 40 mg daily Lisinopril 20 mg once daily Metoprolol tartrate 25 mg BID Reports monitoring home BP using upper arm monitor Recalls recent readings running ~130/80 Have counseled on impact of salt/sodium on blood pressure Counsel patient to continue to monitor home BP, keep log of results including HR readings and bring this record to medical appointments  Hyperlipidemia: Controlled; current treatment: atorvastatin 40 mg daily   Patient Goals/Self-Care Activities Over the next 90 days, patient will:  - check glucose, document, and provide at future appointments - check blood pressure, document, and provide at future appointments - collaborate with provider on medication access solutions - attend medical appointments as scheduled  Follow Up Plan: Telephone follow up appointment with care management team member scheduled for: 04/25/2021 at 11:15 am       Wallace Cullens, PharmD, [place], [place] [phone]

## 2021-03-30 ENCOUNTER — Telehealth: Payer: Self-pay | Admitting: Family Medicine

## 2021-03-30 NOTE — Telephone Encounter (Signed)
Left message for patient to call back and schedule Medicare Annual Wellness Visit (AWV) to be done virtually or by telephone.  No hx of AWV eligible as of 05/06/19  Please schedule at anytime with [facility].      40 Minutes appointment   Any questions, please call me at [phone]

## 2021-04-03 DIAGNOSIS — I10 Essential (primary) hypertension: Secondary | ICD-10-CM | POA: Diagnosis not present

## 2021-04-03 DIAGNOSIS — E1169 Type 2 diabetes mellitus with other specified complication: Secondary | ICD-10-CM

## 2021-04-03 DIAGNOSIS — E785 Hyperlipidemia, unspecified: Secondary | ICD-10-CM

## 2021-04-13 DIAGNOSIS — L089 Local infection of the skin and subcutaneous tissue, unspecified: Secondary | ICD-10-CM | POA: Diagnosis not present

## 2021-04-25 ENCOUNTER — Ambulatory Visit (INDEPENDENT_AMBULATORY_CARE_PROVIDER_SITE_OTHER): Payer: Medicare HMO | Admitting: Pharmacist

## 2021-04-25 DIAGNOSIS — E1169 Type 2 diabetes mellitus with other specified complication: Secondary | ICD-10-CM

## 2021-04-25 DIAGNOSIS — I10 Essential (primary) hypertension: Secondary | ICD-10-CM

## 2021-04-25 NOTE — Patient Instructions (Signed)
Visit Information ? ?Thank you for taking time to visit with me today. Please don't hesitate to contact me if I can be of assistance to you before our next scheduled telephone appointment. ? ?Following are the goals we discussed today:  ? Goals Addressed   ? ?  ?  ?  ?  ? This Visit's Progress  ?  Pharmacy Goals     ?  Our goal A1c is less than 7%. This corresponds with fasting sugars less than 130 and 2 hour after meal sugars less than 180. Please check your blood sugar and keep log of the results ? ?Our goal bad cholesterol, or LDL, is less than 70 . This is why it is important to continue taking your atorvastatin ?  ?Please check your home blood pressure, keep a log of the results and have this to review during our next appointment. ? ?Feel free to call me with any questions or concerns. I look forward to our next call! ? ?Wallace Cullens, PharmD, BCACP ?Clinical Pharmacist ?[facility] ?[place] ?[phone] ?  ? ?  ? ? ? ?Our next appointment is by telephone on 07/25/2021 at 11:15 am ? ?Please call the care guide team at [phone] if you need to cancel or reschedule your appointment.  ? ? ?Patient verbalizes understanding of instructions and care plan provided today and agrees to view in [place]. Active MyChart status confirmed with patient.   ? ?

## 2021-04-25 NOTE — Chronic Care Management (AMB) (Signed)
? ?Chronic Care Management ?CCM Pharmacy Note ? ?04/25/2021 ?Name:  Kevin Perry MRN:  [id number] DOB:  January 21, 1951 ? ? ?Subjective: ?Kevin Perry is an 71 y.o. year old male who is a primary patient of [name], DO.  The CCM team was consulted for assistance with disease management and care coordination needs.   ? ?Engaged with patient by telephone for follow up visit for pharmacy case management and/or care coordination services.  ? ?Objective: ? ?Medications Reviewed Today   ? ? Reviewed by [name], RPH-CPP (Pharmacist) on 04/25/21 at 1143  Med List Status: <None>  ? ?Medication Order Taking? Sig Documenting Provider Last Dose Status Informant  ?acetaminophen (TYLENOL) 325 MG tablet [id number]  Take by mouth.  ?Patient not taking: Reported on 11/15/2020  ? [provider]  Active   ?amLODipine (NORVASC) 10 MG tablet [id number] Yes Take 1 tablet (10 mg total) by mouth daily. [name], DO Taking Active   ?atorvastatin (LIPITOR) 40 MG tablet [id number]  Take 1 tablet (40 mg total) by mouth daily. [name], DO  Active   ?Dulaglutide (TRULICITY) 3 EF/0.0FH SOPN [id number] Yes Inject 3 mg into the skin once a week. [provider] Taking Active   ?escitalopram (LEXAPRO) 20 MG tablet [id number]  Take 1 tablet (20 mg total) by mouth at bedtime. [name], DO  Active   ?folic acid (FOLVITE) 1 MG tablet [id number]  Take 1 mg by mouth See admin instructions. Take 1 mg daily except skip dose on Wednesdays (methotrexate day) [provider]  Active Self  ?furosemide (LASIX) 40 MG tablet [id number] Yes Take 1 tablet (40 mg total) by mouth daily as needed. [name], DO Taking Active   ?gabapentin (NEURONTIN) 100 MG capsule [id number]  Take 1 capsule (100 mg total) by mouth 2 (two) times daily. [name], DO  Active   ?lisinopril (ZESTRIL) 20 MG tablet [id number] Yes Take 1 tablet by mouth daily.  [provider] Taking Active   ?loratadine (CLARITIN) 10 MG tablet [id number]  Take 10 mg by mouth every morning. [provider]  Active Self  ?metFORMIN (GLUCOPHAGE) 500 MG tablet [id number] Yes Take 2 in morning with breakfast and take 1 tab in evening with dinner [name], DO Taking Active   ?methotrexate (RHEUMATREX) 2.5 MG tablet [id number]  Take 7.5 mg by mouth every Wednesday. [provider]  Active Self  ?metoprolol tartrate (LOPRESSOR) 25 MG tablet [id number] Yes Take 1 tablet (25 mg total) by mouth 2 (two) times daily. [name], DO Taking Active   ?nitroGLYCERIN (NITROSTAT) 0.4 MG SL tablet [id number]  Place under the tongue. [provider]  Active   ?tamsulosin (FLOMAX) 0.4 MG CAPS capsule [id number]  Take 1 capsule (0.4 mg total) by mouth daily. [name], DO  Active   ? ?  ?  ? ?  ? ? ?Pertinent Labs:  ?Lab Results  ?Component Value Date  ? HGBA1C 7.3 (H) 02/22/2021  ? ?Lab Results  ?Component Value Date  ? CHOL 122 02/22/2021  ? HDL 38 (L) 02/22/2021  ? [place] 51 02/22/2021  ? TRIG 315 (H) 02/22/2021  ? CHOLHDL 3.2 02/22/2021  ? ?Lab Results  ?Component Value Date  ? CREATININE 0.88 02/22/2021  ? BUN 13 02/22/2021  ? NA 137 02/22/2021  ? K 3.8 02/22/2021  ? CL 100 02/22/2021  ? CO2 31 02/22/2021  ? ?BP Readings from Last 3 Encounters:  ?03/08/21 138/84  ?  01/24/21 (!) 150/80  ?12/04/20 (!) 169/79  ? ?Pulse Readings from Last 3 Encounters:  ?03/08/21 73  ?01/24/21 66  ?12/04/20 (!) 57  ? ? ? ?SDOH:  (Social Determinants of Health) assessments and interventions performed:  ? ? ?CCM Care Plan ? ?Review of patient past medical history, allergies, medications, health status, including review of consultants reports, laboratory and other test data, was performed as part of comprehensive evaluation and provision of chronic care management services.  ? ?Care Plan : PharmD - Med Assistance/Management  ?Updates made by [name], RPH-CPP since 04/25/2021 12:00 AM  ?  ? ?Problem: Disease Progression   ?  ? ?Long-Range Goal: Disease Progression Prevented or Minimized   ?Start Date: 03/27/2020  ?Expected End Date: 06/25/2020  ?This Visit's Progress: On track  ?Recent Progress: On track  ?Priority: High  ?Note:   ?Current Barriers:  ?Unable to independently afford treatment regimen ?Patient APPROVED for Trulicity from [facility] Patient Assistance Program through 02/03/22 ? ?Pharmacist Clinical Goal(s):  ?Over the next 90 days, patient will verbalize ability to afford treatment regimen through collaboration with PharmD and provider.  ? ?Interventions: ?1:1 collaboration with [name], DO regarding development and update of comprehensive plan of care as evidenced by provider attestation and co-signature ?Inter-disciplinary care team collaboration (see longitudinal plan of care) ?Today patient states "I feel good" ? ?T2DM: ?Current treatment: ?Metformin IR '500mg'$  x 2 in AM / x 1 in PM ?Trulicity 3 mg weekly ?Reports tolerating well ?Previous therapies: glipizide ?Reports recent morning fasting readings ranging 115-130 ?Denies symptoms of hypoglycemia ?Encourage patient to have regular well-balanced meals throughout the day and limit carbohydrate portion sizes ?Exercise: walking increased to ~20-35 minutes x 6 days/week ? ?Hypertension: ?Current treatment: ?Amlodipine '10mg'$  daily ?Furosemide 40 mg daily ?Lisinopril 20 mg once daily ?Metoprolol tartrate 25 mg BID ?Reports monitoring home BP using upper arm monitor ?Recalls yesterday evening reading: 122/78, HR 79 ?Have counseled on impact of salt/sodium on blood pressure ?Counsel patient to continue to monitor home BP, keep log of results including HR readings and bring this record to medical appointments ? ?Hyperlipidemia: ?Controlled; current treatment: atorvastatin 40 mg daily ? ? ?Patient Goals/Self-Care Activities ?Over the next 90 days, patient will:  ?- check glucose,  document, and provide at future appointments ?- check blood pressure, document, and provide at future appointments ?- collaborate with provider on medication access solutions ?- attend medical appointments as scheduled ? ?Follow Up Plan: Telephone follow up appointment with care management team member scheduled for: 07/25/2021 at 11:15 am ? ?  ?  ? ?Wallace Cullens, PharmD, BCACP, CPP ?Clinical Pharmacist ?[facility] ?[place] ?[phone] ? ? ? ? ? ?

## 2021-04-26 DIAGNOSIS — I2510 Atherosclerotic heart disease of native coronary artery without angina pectoris: Secondary | ICD-10-CM | POA: Diagnosis not present

## 2021-04-26 DIAGNOSIS — I480 Paroxysmal atrial fibrillation: Secondary | ICD-10-CM | POA: Diagnosis not present

## 2021-04-26 DIAGNOSIS — Z951 Presence of aortocoronary bypass graft: Secondary | ICD-10-CM | POA: Diagnosis not present

## 2021-04-26 DIAGNOSIS — I10 Essential (primary) hypertension: Secondary | ICD-10-CM | POA: Diagnosis not present

## 2021-05-04 DIAGNOSIS — E1169 Type 2 diabetes mellitus with other specified complication: Secondary | ICD-10-CM | POA: Diagnosis not present

## 2021-05-04 DIAGNOSIS — E785 Hyperlipidemia, unspecified: Secondary | ICD-10-CM | POA: Diagnosis not present

## 2021-05-04 DIAGNOSIS — I10 Essential (primary) hypertension: Secondary | ICD-10-CM

## 2021-05-08 ENCOUNTER — Other Ambulatory Visit: Payer: Self-pay | Admitting: Family Medicine

## 2021-05-08 DIAGNOSIS — I10 Essential (primary) hypertension: Secondary | ICD-10-CM

## 2021-05-09 NOTE — Telephone Encounter (Signed)
Requested Prescriptions  ?Pending Prescriptions Disp Refills  ?? metoprolol tartrate (LOPRESSOR) 25 MG tablet [Pharmacy Med Name: METOPROLOL TARTRATE 25 MG TAB] 180 tablet 3  ?  Sig: TAKE 1 TABLET BY MOUTH TWICE DAILY  ?  ? Cardiovascular:  Beta Blockers Passed - 05/08/2021 10:57 AM  ?  ?  Passed - Last BP in normal range  ?  BP Readings from Last 1 Encounters:  ?03/08/21 138/84  ?   ?  ?  Passed - Last Heart Rate in normal range  ?  Pulse Readings from Last 1 Encounters:  ?03/08/21 73  ?   ?  ?  Passed - Valid encounter within last 6 months  ?  Recent Outpatient Visits   ?      ? 2 months ago Annual physical exam  ? [place], DO  ? 3 months ago Right leg pain  ? [place], DO  ? 5 months ago Type 2 diabetes mellitus with other specified complication, without long-term current use of insulin ([place])  ? [place], DO  ? 8 months ago Type 2 diabetes mellitus with other specified complication, without long-term current use of insulin ([place])  ? [place], DO  ? 1 year ago Diabetic polyneuropathy associated with type 2 diabetes mellitus ([place])  ? [place], DO  ?  ?  ? ?  ?  ?  ? ?

## 2021-05-15 ENCOUNTER — Telehealth: Payer: Self-pay | Admitting: Family Medicine

## 2021-05-15 NOTE — Telephone Encounter (Signed)
Left message for patient to call back and schedule Medicare Annual Wellness Visit (AWV) to be done virtually or by telephone. ? ?No hx of AWV eligible as of 05/06/19 ? ?Please schedule at anytime with [facility].     ? ?Any questions, please call me at [phone]  ?

## 2021-05-30 ENCOUNTER — Other Ambulatory Visit: Payer: Self-pay | Admitting: Family Medicine

## 2021-05-30 DIAGNOSIS — I872 Venous insufficiency (chronic) (peripheral): Secondary | ICD-10-CM

## 2021-05-31 NOTE — Telephone Encounter (Signed)
Requested Prescriptions  ?Pending Prescriptions Disp Refills  ?? furosemide (LASIX) 40 MG tablet [Pharmacy Med Name: FUROSEMIDE 40 MG TAB] 90 tablet 3  ?  Sig: TAKE 1 TABLET BY MOUTH ONCE DAILY AS NEEDED  ?  ? Cardiovascular:  Diuretics - Loop Failed - 05/30/2021 10:56 AM  ?  ?  Failed - Mg Level in normal range and within 180 days  ?  No results found for: MG   ?  ?  Passed - K in normal range and within 180 days  ?  Potassium  ?Date Value Ref Range Status  ?02/22/2021 3.8 3.5 - 5.3 mmol/L Final  ?09/19/2011 3.7 3.5 - 5.1 mmol/L Final  ?   ?  ?  Passed - Ca in normal range and within 180 days  ?  Calcium  ?Date Value Ref Range Status  ?02/22/2021 9.2 8.6 - 10.3 mg/dL Final  ? ?Calcium, Total  ?Date Value Ref Range Status  ?09/19/2011 8.8 8.5 - 10.1 mg/dL Final  ?   ?  ?  Passed - Na in normal range and within 180 days  ?  Sodium  ?Date Value Ref Range Status  ?02/22/2021 137 135 - 146 mmol/L Final  ?09/19/2011 136 136 - 145 mmol/L Final  ?   ?  ?  Passed - Cr in normal range and within 180 days  ?  Creat  ?Date Value Ref Range Status  ?02/22/2021 0.88 0.70 - 1.28 mg/dL Final  ? ?Creatinine, POC  ?Date Value Ref Range Status  ?03/09/2015 0 mg/dL Final  ?   ?  ?  Passed - Cl in normal range and within 180 days  ?  Chloride  ?Date Value Ref Range Status  ?02/22/2021 100 98 - 110 mmol/L Final  ?09/19/2011 101 98 - 107 mmol/L Final  ?   ?  ?  Passed - Last BP in normal range  ?  BP Readings from Last 1 Encounters:  ?03/08/21 138/84  ?   ?  ?  Passed - Valid encounter within last 6 months  ?  Recent Outpatient Visits   ?      ? 2 months ago Annual physical exam  ? [place], DO  ? 4 months ago Right leg pain  ? [place], DO  ? 5 months ago Type 2 diabetes mellitus with other specified complication, without long-term current use of insulin ([place])  ? [place], DO  ? 9 months ago Type 2 diabetes  mellitus with other specified complication, without long-term current use of insulin ([place])  ? [place], DO  ? 1 year ago Diabetic polyneuropathy associated with type 2 diabetes mellitus ([place])  ? [place], DO  ?  ?  ? ?  ?  ?  ? ? ?

## 2021-06-20 ENCOUNTER — Ambulatory Visit (INDEPENDENT_AMBULATORY_CARE_PROVIDER_SITE_OTHER): Payer: Medicare HMO | Admitting: Family Medicine

## 2021-06-20 ENCOUNTER — Encounter: Payer: Self-pay | Admitting: Family Medicine

## 2021-06-20 VITALS — BP 140/78 | HR 53 | Ht 75.0 in | Wt 267.8 lb

## 2021-06-20 DIAGNOSIS — F331 Major depressive disorder, recurrent, moderate: Secondary | ICD-10-CM | POA: Diagnosis not present

## 2021-06-20 MED ORDER — BUPROPION HCL ER (XL) 150 MG PO TB24: 150.0000 mg | ORAL_TABLET | Freq: Every day | ORAL | 1 refills | Status: DC

## 2021-06-20 NOTE — Progress Notes (Signed)
? ?Subjective:  ? ? Patient ID: Kevin Perry, male    DOB: February 13, 1950, 71 y.o.   MRN: [id number] ? ?Kevin Perry is a 71 y.o. male presenting on 06/20/2021 for Depression and Anxiety ? ? ?HPI ? ?Major Depression, recurrent ?Background history in 2016 PCP started him on Escitalopram 83m daily and he did fairly well for years, now about 7 years on the medicine seems to have less effect and faded away. Still taking it without missed doses. ?He has no stress currently. He does admit his daughter moved in back home. It has changed a dynamic but not the source of his problem. ?He admits reduced energy and motivation. ?Feeling need to sleep more. ?Emotional lability with crying spells spontaneous ?He is no longer anxious and not worrying about anything. ? ? ? ?  06/20/2021  ?  9:01 AM 03/08/2021  ?  9:46 AM 12/04/2020  ? 11:20 AM  ?Depression screen PHQ 2/9  ?Decreased Interest 3 0 0  ?Down, Depressed, Hopeless 3 0 0  ?PHQ - 2 Score 6 0 0  ?Altered sleeping 2 0   ?Tired, decreased energy 2 0   ?Change in appetite 1 0   ?Feeling bad or failure about yourself  1 0   ?Trouble concentrating 1 0   ?Moving slowly or fidgety/restless 0 0   ?Suicidal thoughts 0 0   ?PHQ-9 Score 13 0   ?Difficult doing work/chores Somewhat difficult Not difficult at all   ? ? ?  06/20/2021  ?  9:02 AM 08/30/2020  ? 10:56 AM 07/17/2018  ?  1:23 PM 03/12/2017  ?  1:21 PM  ?GAD 7 : Generalized Anxiety Score  ?Nervous, Anxious, on Edge 0 1 0 0  ?Control/stop worrying 0 0 0 0  ?Worry too much - different things 0 0 0 0  ?Trouble relaxing 0 1 0 0  ?Restless 0 0 0 0  ?Easily annoyed or irritable 1 1 0 0  ?Afraid - awful might happen 0 0 0 0  ?Total GAD 7 Score 1 3 0 0  ?Anxiety Difficulty Somewhat difficult Not difficult at all Not difficult at all Not difficult at all  ? ? ? ? ?Social History  ? ?Tobacco Use  ? Smoking status: Former  ?  Types: Cigarettes  ?  Quit date: 08/16/2004  ?  Years since quitting: 16.8  ? Smokeless tobacco: Former  ? Tobacco  comments:  ?  quit 20 plus years  ?Vaping Use  ? Vaping Use: Never used  ?Substance Use Topics  ? Alcohol use: Yes  ?  Alcohol/week: 2.0 standard drinks  ?  Types: 2 Cans of beer per week  ?  Comment: SOCIAL  ? Drug use: No  ? ? ?Review of Systems ?Per HPI unless specifically indicated above ? ?   ?Objective:  ?  ?BP 140/78   Pulse (!) 53   Ht '6\' 3"'  (1.905 m)   Wt 267 lb 12.8 oz (121.5 kg)   SpO2 98%   BMI 33.47 kg/m?   ?Wt Readings from Last 3 Encounters:  ?06/20/21 267 lb 12.8 oz (121.5 kg)  ?03/08/21 268 lb 12.8 oz (121.9 kg)  ?01/24/21 275 lb 12.8 oz (125.1 kg)  ?  ?Physical Exam ?Vitals and nursing note reviewed.  ?Constitutional:   ?   General: He is not in acute distress. ?   Appearance: Normal appearance. He is well-developed. He is not diaphoretic.  ?   Comments: Well-appearing, comfortable, cooperative  ?HENT:  ?  Head: Normocephalic and atraumatic.  ?Eyes:  ?   General:     ?   Right eye: No discharge.     ?   Left eye: No discharge.  ?   Conjunctiva/sclera: Conjunctivae normal.  ?Cardiovascular:  ?   Rate and Rhythm: Normal rate.  ?Pulmonary:  ?   Effort: Pulmonary effort is normal.  ?Skin: ?   General: Skin is warm and dry.  ?   Findings: No erythema or rash.  ?Neurological:  ?   Mental Status: He is alert and oriented to person, place, and time.  ?Psychiatric:     ?   Mood and Affect: Mood normal.     ?   Behavior: Behavior normal.     ?   Thought Content: Thought content normal.  ?   Comments: Well groomed, good eye contact, normal speech and thoughts  ? ? ? ?Results for orders placed or performed in visit on 02/22/21  ?COMPLETE METABOLIC PANEL WITH GFR  ?Result Value Ref Range  ? Glucose, Bld 200 (H) 65 - 139 mg/dL  ? BUN 13 7 - 25 mg/dL  ? Creat 0.88 0.70 - 1.28 mg/dL  ? eGFR 93 > OR = 60 mL/min/1.86m  ? BUN/Creatinine Ratio NOT APPLICABLE 6 - 22 (calc)  ? Sodium 137 135 - 146 mmol/L  ? Potassium 3.8 3.5 - 5.3 mmol/L  ? Chloride 100 98 - 110 mmol/L  ? CO2 31 20 - 32 mmol/L  ? Calcium 9.2 8.6  - 10.3 mg/dL  ? Total Protein 6.4 6.1 - 8.1 g/dL  ? Albumin 4.1 3.6 - 5.1 g/dL  ? Globulin 2.3 1.9 - 3.7 g/dL (calc)  ? AG Ratio 1.8 1.0 - 2.5 (calc)  ? Total Bilirubin 1.0 0.2 - 1.2 mg/dL  ? Alkaline phosphatase (APISO) 49 35 - 144 U/L  ? AST 16 10 - 35 U/L  ? ALT 22 9 - 46 U/L  ?Lipid panel  ?Result Value Ref Range  ? Cholesterol 122 <200 mg/dL  ? HDL 38 (L) > OR = 40 mg/dL  ? Triglycerides 315 (H) <150 mg/dL  ? LDL Cholesterol (Calc) 51 mg/dL (calc)  ? Total CHOL/HDL Ratio 3.2 <5.0 (calc)  ? Non-HDL Cholesterol (Calc) 84 <130 mg/dL (calc)  ?CBC with Differential/Platelet  ?Result Value Ref Range  ? WBC 6.3 3.8 - 10.8 Thousand/uL  ? RBC 4.61 4.20 - 5.80 Million/uL  ? Hemoglobin 15.2 13.2 - 17.1 g/dL  ? HCT 43.7 38.5 - 50.0 %  ? MCV 94.8 80.0 - 100.0 fL  ? MCH 33.0 27.0 - 33.0 pg  ? MCHC 34.8 32.0 - 36.0 g/dL  ? RDW 13.2 11.0 - 15.0 %  ? Platelets 186 140 - 400 Thousand/uL  ? MPV 9.8 7.5 - 12.5 fL  ? Neutro Abs 3,282 1,500 - 7,800 cells/uL  ? Lymphs Abs 2,249 850 - 3,900 cells/uL  ? Absolute Monocytes 454 200 - 950 cells/uL  ? Eosinophils Absolute 265 15 - 500 cells/uL  ? Basophils Absolute 50 0 - 200 cells/uL  ? Neutrophils Relative % 52.1 %  ? Total Lymphocyte 35.7 %  ? Monocytes Relative 7.2 %  ? Eosinophils Relative 4.2 %  ? Basophils Relative 0.8 %  ?Hemoglobin A1c  ?Result Value Ref Range  ? Hgb A1c MFr Bld 7.3 (H) <5.7 % of total Hgb  ? Mean Plasma Glucose 163 mg/dL  ? eAG (mmol/L) 9.0 mmol/L  ?PSA  ?Result Value Ref Range  ? PSA 0.32 < OR =  4.00 ng/mL  ? ?   ?Assessment & Plan:  ? ?Problem List Items Addressed This Visit   ? ? Major depressive disorder, recurrent, moderate ([place]) - Primary  ? Relevant Medications  ? buPROPion (WELLBUTRIN XL) 150 MG 24 hr tablet  ?  ?Significant reduced mood with worsening major depression now ?Previously in remission now worsening ?No significant acute stressor or trigger ?Previous SSRI for 7 years Lexapro just less effective now ?No anxiety component ?See PHQ GAD  scores ? ?For now we discussed timeline, we agree to add new med today for quicker effect. Then we can look at SSRI adjust ? ?Start Wellbutrin XL 122m daily today, reviewed benefit risk side effect. Take in AM avoid insomnia. May help him with hypersomnia ? ?Continue Lexapro 144mdaily in PM ? ?Next visit 4 weeks we can phase off Lexapro and switch to Zoloft is what I would recommend. And keep Wellbutrin if doing well. ? ?Has declined CBT therapy ? ? ?Meds ordered this encounter  ?Medications  ? buPROPion (WELLBUTRIN XL) 150 MG 24 hr tablet  ?  Sig: Take 1 tablet (150 mg total) by mouth daily.  ?  Dispense:  90 tablet  ?  Refill:  1  ? ? ? ? ?Follow up plan: ?Return in about 4 weeks (around 07/18/2021) for 4 weeks follow-up Depression med adjust. ? ? ?AlNobie PutnamDO ?[facility] ?06/20/2021, 8:56 AM ?

## 2021-06-20 NOTE — Patient Instructions (Addendum)
Thank you for coming to the office today. ? ?Start Wellbutrin generic '150mg'$  XL daily, take in morning ?May continue generic Lexapro Escitalopram '10mg'$  nightly ? ?At next visit in 4 weeks we can discuss shift from Lexapro over to the Zoloft phase down and start the new one at next visit. ? ? ?Please schedule a Follow-up Appointment to: Return in about 4 weeks (around 07/18/2021) for 4 weeks follow-up Depression med adjust. ? ?If you have any other questions or concerns, please feel free to call the office or send a message through [place]. You may also schedule an earlier appointment if necessary. ? ?Additionally, you may be receiving a survey about your experience at our office within a few days to 1 week by e-mail or mail. We value your feedback. ? ?[name], DO ?[facility] ?

## 2021-06-29 ENCOUNTER — Ambulatory Visit (INDEPENDENT_AMBULATORY_CARE_PROVIDER_SITE_OTHER): Payer: Medicare HMO

## 2021-06-29 ENCOUNTER — Ambulatory Visit: Payer: Medicare HMO | Admitting: Podiatry

## 2021-06-29 ENCOUNTER — Encounter: Payer: Self-pay | Admitting: Podiatry

## 2021-06-29 VITALS — Temp 98.1°F

## 2021-06-29 DIAGNOSIS — L97512 Non-pressure chronic ulcer of other part of right foot with fat layer exposed: Secondary | ICD-10-CM | POA: Diagnosis not present

## 2021-06-29 DIAGNOSIS — L97511 Non-pressure chronic ulcer of other part of right foot limited to breakdown of skin: Secondary | ICD-10-CM | POA: Diagnosis not present

## 2021-06-29 MED ORDER — DOXYCYCLINE HYCLATE 100 MG PO TABS: 100.0000 mg | ORAL_TABLET | Freq: Two times a day (BID) | ORAL | 0 refills | Status: DC

## 2021-06-29 NOTE — Progress Notes (Signed)
HPI: 71 y.o. male presenting today for follow-up evaluation of an ulcer that developed to the lateral aspect of the right third toe.  Patient was last seen in the office on 03/06/2021.  He says that he has not been able to make it back into the office since then for follow-up.  He is concerned for the deterioration of the wound on the right third toe.  He presents for further treatment and evaluation  Past Medical History:  Diagnosis Date   Anxiety    Arthritis    Coronary artery disease    Hypertensive retinopathy    Myocardial infarction [facility])    Postoperative nausea 11/27/2019    Past Surgical History:  Procedure Laterality Date   CATARACT EXTRACTION     CHOLECYSTECTOMY     CORONARY ANGIOPLASTY WITH STENT PLACEMENT N/A 02/04/2006   EYE SURGERY     FOOT SURGERY Left    DOS 8.1.14 HALLIX IPJ FUSION, SECOND MET OSTEOTOMY W/SCREW, HAM TOE REPAIR 2,,4 , PARTIAL AMP 3RD DIGIT    GALLBLADDER SURGERY     KNEE SURERY Right    LEFT HEART CATH AND CORONARY ANGIOGRAPHY Left 11/17/2019   Procedure: LEFT HEART CATH AND CORONARY ANGIOGRAPHY;  Surgeon: [name], MD;  Location: [place] [facility];  Service: Cardiovascular;  Laterality: Left;   TOTAL KNEE ARTHROPLASTY Right 08/30/2014   Procedure: TOTAL KNEE ARTHROPLASTY;  Surgeon: [name], MD;  Location: [facility] ORS;  Service: Orthopedics;  Laterality: Right;    Allergies  Allergen Reactions   Naproxen Swelling and Anaphylaxis     Physical Exam: General: The patient is alert and oriented x3 in no acute distress.  Dermatology: Ulcer noted to the lateral aspect of the right third toe measuring approximately 1.0 x 1.0 x 0.3 cm.  Unfortunately there is exposed bone.  Healthy bleeding noted.  Mild erythema localized to the toe  Vascular: Palpable pedal pulses bilaterally. Capillary refill within normal limits.  Negative for any significant edema   Neurological: Light touch and protective threshold absent  Musculoskeletal Exam:  History of right third partial toe amputation.  Right second toe amputation.  Radiographic exam RT foot 06/29/2021: Deterioration of the distal portion of the phalanx of the right third toe consistent with findings of osteomyelitis.     Assessment: 1.  Ulcer right third toe lateral aspect 2.  Osteomyelitis right third toe   Plan of Care:  1. Patient evaluated.  X-rays reviewed 2.  Unfortunately the toe has deteriorated since last seen here in the office on 03/06/2021.  X-rays consistent with osteomyelitis isolated to the toe 3.  Discussed toe amputation of the right third digit.  Patient agrees.  All possible complications and details the procedure were explained.  No guarantees were expressed or implied.  I do believe this is the best treatment option for the patient to prevent spread of infection and more proximal possible limb loss.  Patient is in agreement 4.  Cultures taken and sent to pathology for culture and sensitivity 5.  Prescription for doxycycline 100 mg 2 times daily #20 6.  Authorization for surgery was initiated today.  Surgery will consist of right third toe amputation 7.  Return to clinic 1 week postop   [name], DPM [facility]  Dr. [name], DPM    [address]  [place], [place] [address]                Office [phone]  Fax [phone]

## 2021-06-29 NOTE — Addendum Note (Signed)
Addended by: Lolita Rieger on: 06/29/2021 12:14 PM   Modules accepted: Orders

## 2021-07-02 LAB — WOUND CULTURE

## 2021-07-02 LAB — SPECIMEN STATUS REPORT

## 2021-07-04 ENCOUNTER — Telehealth: Payer: Self-pay

## 2021-07-04 ENCOUNTER — Telehealth: Payer: Self-pay | Admitting: Podiatry

## 2021-07-04 NOTE — Telephone Encounter (Signed)
Pts wife called and states that the patient is in a lot of pain with his toe that was partially removed. He has been scheduled for next week 6/8 but the wife wants to know if he could be added to the sx schedule tomorrow so that he doesn't have to be in this much pain for a whole week.   Please advise.

## 2021-07-04 NOTE — Telephone Encounter (Signed)
DOS 07/05/2021  AMPUTATION TOE 3RD RT - 28820  [employer]  The following codes do not require a pre-authorization Created on 07/04/2021  Plan Year 02/04/2021 - 02/03/9998  Service info [phone] Amputation, toe; metatarsophalangeal joint

## 2021-07-04 NOTE — Telephone Encounter (Signed)
This is up to [facility] if we can add him to the schedule or not. Shelly I'm up for it if you are. - Dr. Amalia Hailey

## 2021-07-05 ENCOUNTER — Encounter: Payer: Self-pay | Admitting: Podiatry

## 2021-07-05 ENCOUNTER — Other Ambulatory Visit: Payer: Self-pay | Admitting: Podiatry

## 2021-07-05 DIAGNOSIS — E119 Type 2 diabetes mellitus without complications: Secondary | ICD-10-CM | POA: Diagnosis not present

## 2021-07-05 DIAGNOSIS — L97512 Non-pressure chronic ulcer of other part of right foot with fat layer exposed: Secondary | ICD-10-CM | POA: Diagnosis not present

## 2021-07-05 DIAGNOSIS — Z0100 Encounter for examination of eyes and vision without abnormal findings: Secondary | ICD-10-CM | POA: Diagnosis not present

## 2021-07-05 DIAGNOSIS — H26493 Other secondary cataract, bilateral: Secondary | ICD-10-CM | POA: Diagnosis not present

## 2021-07-05 DIAGNOSIS — E113312 Type 2 diabetes mellitus with moderate nonproliferative diabetic retinopathy with macular edema, left eye: Secondary | ICD-10-CM | POA: Diagnosis not present

## 2021-07-05 DIAGNOSIS — H02889 Meibomian gland dysfunction of unspecified eye, unspecified eyelid: Secondary | ICD-10-CM | POA: Diagnosis not present

## 2021-07-05 DIAGNOSIS — M869 Osteomyelitis, unspecified: Secondary | ICD-10-CM | POA: Diagnosis not present

## 2021-07-05 DIAGNOSIS — M86171 Other acute osteomyelitis, right ankle and foot: Secondary | ICD-10-CM | POA: Diagnosis not present

## 2021-07-05 MED ORDER — OXYCODONE-ACETAMINOPHEN 5-325 MG PO TABS: 1.0000 | ORAL_TABLET | ORAL | 0 refills | Status: DC | PRN

## 2021-07-05 NOTE — Progress Notes (Signed)
PRN postop 

## 2021-07-13 ENCOUNTER — Ambulatory Visit: Payer: Medicare HMO

## 2021-07-13 ENCOUNTER — Ambulatory Visit (INDEPENDENT_AMBULATORY_CARE_PROVIDER_SITE_OTHER): Payer: Medicare HMO

## 2021-07-13 ENCOUNTER — Ambulatory Visit (INDEPENDENT_AMBULATORY_CARE_PROVIDER_SITE_OTHER): Payer: Medicare HMO | Admitting: Podiatry

## 2021-07-13 ENCOUNTER — Encounter: Payer: Self-pay | Admitting: Podiatry

## 2021-07-13 DIAGNOSIS — L97512 Non-pressure chronic ulcer of other part of right foot with fat layer exposed: Secondary | ICD-10-CM | POA: Diagnosis not present

## 2021-07-13 DIAGNOSIS — Z9889 Other specified postprocedural states: Secondary | ICD-10-CM

## 2021-07-13 MED ORDER — DOXYCYCLINE HYCLATE 100 MG PO TABS
100.0000 mg | ORAL_TABLET | Freq: Two times a day (BID) | ORAL | 0 refills | Status: DC
Start: 2021-07-13 — End: 2021-08-01

## 2021-07-13 NOTE — Progress Notes (Signed)
   Subjective:  Patient presents today status post right third toe amputation. DOS: 07/05/2021.  Patient states that he is doing well.  He has kept the dressings clean dry and intact.  Weightbearing in the surgical shoe.  No new complaints at this time  Past Medical History:  Diagnosis Date   Anxiety    Arthritis    Coronary artery disease    Hypertensive retinopathy    Myocardial infarction [facility])    Postoperative nausea 11/27/2019   Past Surgical History:  Procedure Laterality Date   CATARACT EXTRACTION     CHOLECYSTECTOMY     CORONARY ANGIOPLASTY WITH STENT PLACEMENT N/A 02/04/2006   EYE SURGERY     FOOT SURGERY Left    DOS 8.1.14 HALLIX IPJ FUSION, SECOND MET OSTEOTOMY W/SCREW, HAM TOE REPAIR 2,,4 , PARTIAL AMP 3RD DIGIT    GALLBLADDER SURGERY     KNEE SURERY Right    LEFT HEART CATH AND CORONARY ANGIOGRAPHY Left 11/17/2019   Procedure: LEFT HEART CATH AND CORONARY ANGIOGRAPHY;  Surgeon: [name], MD;  Location: [place] [facility];  Service: Cardiovascular;  Laterality: Left;   TOTAL KNEE ARTHROPLASTY Right 08/30/2014   Procedure: TOTAL KNEE ARTHROPLASTY;  Surgeon: [name], MD;  Location: [facility] ORS;  Service: Orthopedics;  Laterality: Right;   Allergies  Allergen Reactions   Naproxen Swelling and Anaphylaxis     Objective/Physical Exam Neurovascular status intact.  Skin incisions appear to be well coapted with sutures intact. No sign of infectious process noted. No dehiscence. No active bleeding noted. Moderate edema noted to the surgical extremity.  Radiographic Exam:  Normal osseous mineralization.  Hallux valgus deformity noted.  Absence of the second and third digits at the level of the MTP joint noted.  No erosions.  No concern radiographically for osteomyelitis.  No fractures.  Assessment: 1. s/p right third toe amputation. DOS: 07/05/2021   Plan of Care:  1. Patient was evaluated. X-rays reviewed 2.  Dressings changed.  CDI x1 week 3.  Continue  minimal weightbearing in the surgical shoe as tolerated 4.  Continue oral antibiotics as prescribed.  Refill prescription for doxycycline 100 mg 2 times daily #20 5.  Return to clinic in 1 week   [name], DPM [facility]  Dr. [name], DPM    [address], [place] [address]                Office [phone]  Fax [phone]

## 2021-07-18 ENCOUNTER — Encounter: Payer: Self-pay | Admitting: Family Medicine

## 2021-07-18 ENCOUNTER — Ambulatory Visit (INDEPENDENT_AMBULATORY_CARE_PROVIDER_SITE_OTHER): Payer: Medicare HMO | Admitting: Family Medicine

## 2021-07-18 DIAGNOSIS — F331 Major depressive disorder, recurrent, moderate: Secondary | ICD-10-CM

## 2021-07-18 MED ORDER — SERTRALINE HCL 50 MG PO TABS: 50.0000 mg | ORAL_TABLET | Freq: Every day | ORAL | 1 refills | Status: DC

## 2021-07-18 NOTE — Progress Notes (Signed)
Subjective:    Patient ID: Kevin Perry, male    DOB: March 09, 1950, 71 y.o.   MRN: [id number]  Kevin Perry is a 71 y.o. male presenting on 07/18/2021 for Depression   HPI  Major Depression, recurrent  Last visit Background history in 2016 PCP started him on Escitalopram '10mg'$  daily and he did fairly well for years, now about 7 years on the medicine seems to have less effect and faded away. Still taking it without missed doses. He has no stress currently. He does admit his daughter moved in back home. It has changed a dynamic but not the source of his problem. He admits reduced energy and motivation. Feeling need to sleep more. Emotional lability with crying spells spontaneous He is no longer anxious and not worrying about anything.  He was started on Wellbutrin XL 150 daily last visit 4 weeks ago Now today report no significant improvement, no change. Not worse or better Interested in other med change today.      07/18/2021    8:53 AM 06/20/2021    9:01 AM 03/08/2021    9:46 AM  Depression screen PHQ 2/9  Decreased Interest 3 3 0  Down, Depressed, Hopeless 2 3 0  PHQ - 2 Score 5 6 0  Altered sleeping 2 2 0  Tired, decreased energy 2 2 0  Change in appetite 0 1 0  Feeling bad or failure about yourself  0 1 0  Trouble concentrating 1 1 0  Moving slowly or fidgety/restless 0 0 0  Suicidal thoughts 0 0 0  PHQ-9 Score 10 13 0  Difficult doing work/chores Somewhat difficult Somewhat difficult Not difficult at all      07/18/2021    8:53 AM 06/20/2021    9:02 AM 08/30/2020   10:56 AM 07/17/2018    1:23 PM  GAD 7 : Generalized Anxiety Score  Nervous, Anxious, on Edge 0 0 1 0  Control/stop worrying 0 0 0 0  Worry too much - different things 0 0 0 0  Trouble relaxing 0 0 1 0  Restless 0 0 0 0  Easily annoyed or irritable 0 1 1 0  Afraid - awful might happen 0 0 0 0  Total GAD 7 Score 0 1 3 0  Anxiety Difficulty Not difficult at all Somewhat difficult Not difficult at  all Not difficult at all      Social History   Tobacco Use   Smoking status: Former    Types: Cigarettes    Quit date: 08/16/2004    Years since quitting: 16.9   Smokeless tobacco: Former   Tobacco comments:    quit 20 plus years  Vaping Use   Vaping Use: Never used  Substance Use Topics   Alcohol use: Not Currently    Alcohol/week: 2.0 standard drinks of alcohol    Types: 2 Cans of beer per week    Comment: SOCIAL   Drug use: No    Review of Systems Per HPI unless specifically indicated above     Objective:    BP 140/80   Pulse 61   Ht '6\' 3"'$  (1.905 m)   Wt 268 lb 12.8 oz (121.9 kg)   SpO2 95%   BMI 33.60 kg/m   Wt Readings from Last 3 Encounters:  07/18/21 268 lb 12.8 oz (121.9 kg)  06/20/21 267 lb 12.8 oz (121.5 kg)  03/08/21 268 lb 12.8 oz (121.9 kg)    Physical Exam Vitals and nursing note reviewed.  Constitutional:      General: He is not in acute distress.    Appearance: Normal appearance. He is well-developed. He is not diaphoretic.     Comments: Well-appearing, comfortable, cooperative  HENT:     Head: Normocephalic and atraumatic.  Eyes:     General:        Right eye: No discharge.        Left eye: No discharge.     Conjunctiva/sclera: Conjunctivae normal.  Cardiovascular:     Rate and Rhythm: Normal rate.  Pulmonary:     Effort: Pulmonary effort is normal.  Skin:    General: Skin is warm and dry.     Findings: No erythema or rash.  Neurological:     Mental Status: He is alert and oriented to person, place, and time.  Psychiatric:        Mood and Affect: Mood normal.        Behavior: Behavior normal.        Thought Content: Thought content normal.     Comments: Well groomed, good eye contact, normal speech and thoughts    Results for orders placed or performed in visit on 06/29/21  WOUND CULTURE   Specimen: Wound   Wound SENSITIVITY  Result Value Ref Range   Gram Stain Result Final report    Organism ID, Bacteria Comment     Organism ID, Bacteria Comment    Aerobic Bacterial Culture Final report    Organism ID, Bacteria Mixed skin flora   Specimen status report  Result Value Ref Range   specimen status report Comment       Assessment & Plan:   Problem List Items Addressed This Visit     Major depressive disorder, recurrent, moderate ([facility])   Relevant Medications   buPROPion (WELLBUTRIN XL) 300 MG 24 hr tablet   sertraline (ZOLOFT) 50 MG tablet    First, Double the Wellbutrin XL from 150 daily to 2 pills at once for '300mg'$  daily. If this is successful and you run low on supply, message or call request new order of the '300mg'$  dose 1 tab daily  ----------------------------------  Medication Taper Off Instructions Current med - Escitalopram '20mg'$   Week 1-2: 10 mg daily (HALF tablet) Week 3: 10 mg every OTHER day (HALF tablet) - alternating day is NO medicine (skip dose) STOP  completely  If you need to add a week 4 that is fine.  After finished  - then start new medication Sertraline (Zoloft) '50mg'$  daily every day.  Meds ordered this encounter  Medications   sertraline (ZOLOFT) 50 MG tablet    Sig: Take 1 tablet (50 mg total) by mouth daily.    Dispense:  90 tablet    Refill:  1      Follow up plan: Return if symptoms worsen or fail to improve.    [name], [facility] 07/18/2021, 9:06 AM

## 2021-07-18 NOTE — Patient Instructions (Addendum)
Thank you for coming to the office today.  First, Double the Wellbutrin XL from 150 daily to 2 pills at once for '300mg'$  daily. If this is successful and you run low on supply, message or call request new order of the '300mg'$  dose 1 tab daily  ----------------------------------  Medication Taper Off Instructions Current med - Escitalopram '20mg'$   Week 1-2: 10 mg daily (HALF tablet) Week 3: 10 mg every OTHER day (HALF tablet) - alternating day is NO medicine (skip dose) STOP  completely  If you need to add a week 4 that is fine.  After finished  - then start new medication Sertraline (Zoloft) '50mg'$  daily every day.   Please schedule a Follow-up Appointment to: Return if symptoms worsen or fail to improve.  If you have any other questions or concerns, please feel free to call the office or send a message through [place]. You may also schedule an earlier appointment if necessary.  Additionally, you may be receiving a survey about your experience at our office within a few days to 1 week by e-mail or mail. We value your feedback.  [name], DO [place]

## 2021-07-20 ENCOUNTER — Ambulatory Visit (INDEPENDENT_AMBULATORY_CARE_PROVIDER_SITE_OTHER): Payer: Medicare HMO | Admitting: Podiatry

## 2021-07-20 ENCOUNTER — Encounter: Payer: Medicare HMO | Admitting: Podiatry

## 2021-07-20 DIAGNOSIS — Z9889 Other specified postprocedural states: Secondary | ICD-10-CM

## 2021-07-25 ENCOUNTER — Telehealth: Payer: Medicare HMO

## 2021-07-26 ENCOUNTER — Ambulatory Visit (INDEPENDENT_AMBULATORY_CARE_PROVIDER_SITE_OTHER): Payer: Medicare HMO | Admitting: Internal Medicine

## 2021-07-26 ENCOUNTER — Encounter: Payer: Self-pay | Admitting: Internal Medicine

## 2021-07-26 VITALS — BP 128/74 | HR 61 | Temp 96.9°F | Wt 262.0 lb

## 2021-07-26 DIAGNOSIS — M542 Cervicalgia: Secondary | ICD-10-CM

## 2021-07-26 DIAGNOSIS — H6121 Impacted cerumen, right ear: Secondary | ICD-10-CM

## 2021-07-26 MED ORDER — KETOROLAC TROMETHAMINE 30 MG/ML IJ SOLN
30.0000 mg | Freq: Once | INTRAMUSCULAR | Status: AC
  Administered 2021-07-26: 30 mg via INTRAMUSCULAR

## 2021-07-26 MED ORDER — TIZANIDINE HCL 4 MG PO CAPS: 4.0000 mg | ORAL_CAPSULE | Freq: Every evening | ORAL | 0 refills | Status: DC | PRN

## 2021-07-26 NOTE — Patient Instructions (Signed)
Neck Exercises Ask your health care provider which exercises are safe for you. Do exercises exactly as told by your health care provider and adjust them as directed. It is normal to feel mild stretching, pulling, tightness, or discomfort as you do these exercises. Stop right away if you feel sudden pain or your pain gets worse. Do not begin these exercises until told by your health care provider. Neck exercises can be important for many reasons. They can improve strength and maintain flexibility in your neck, which will help your upper back and prevent neck pain. Stretching exercises Rotation neck stretching  Sit in a chair or stand up. Place your feet flat on the floor, shoulder-width apart. Slowly turn your head (rotate) to the right until a slight stretch is felt. Turn it all the way to the right so you can look over your right shoulder. Do not tilt or tip your head. Hold this position for 10-30 seconds. Slowly turn your head (rotate) to the left until a slight stretch is felt. Turn it all the way to the left so you can look over your left shoulder. Do not tilt or tip your head. Hold this position for 10-30 seconds. Repeat __________ times. Complete this exercise __________ times a day. Neck retraction  Sit in a sturdy chair or stand up. Look straight ahead. Do not bend your neck. Use your fingers to push your chin backward (retraction). Do not bend your neck for this movement. Continue to face straight ahead. If you are doing the exercise properly, you will feel a slight sensation in your throat and a stretch at the back of your neck. Hold the stretch for 1-2 seconds. Repeat __________ times. Complete this exercise __________ times a day. Strengthening exercises Neck press  Lie on your back on a firm bed or on the floor with a pillow under your head. Use your neck muscles to push your head down on the pillow and straighten your spine. Hold the position as well as you can. Keep your head  facing up (in a neutral position) and your chin tucked. Slowly count to 5 while holding this position. Repeat __________ times. Complete this exercise __________ times a day. Isometrics These are exercises in which you strengthen the muscles in your neck while keeping your neck still (isometrics). Sit in a supportive chair and place your hand on your forehead. Keep your head and face facing straight ahead. Do not flex or extend your neck while doing isometrics. Push forward with your head and neck while pushing back with your hand. Hold for 10 seconds. Do the sequence again, this time putting your hand against the back of your head. Use your head and neck to push backward against the hand pressure. Finally, do the same exercise on either side of your head, pushing sideways against the pressure of your hand. Repeat __________ times. Complete this exercise __________ times a day. Prone head lifts  Lie face-down (prone position), resting on your elbows so that your chest and upper back are raised. Start with your head facing downward, near your chest. Position your chin either on or near your chest. Slowly lift your head upward. Lift until you are looking straight ahead. Then continue lifting your head as far back as you can comfortably stretch. Hold your head up for 5 seconds. Then slowly lower it to your starting position. Repeat __________ times. Complete this exercise __________ times a day. Supine head lifts  Lie on your back (supine position), bending your knees   to point to the ceiling and keeping your feet flat on the floor. Lift your head slowly off the floor, raising your chin toward your chest. Hold for 5 seconds. Repeat __________ times. Complete this exercise __________ times a day. Scapular retraction  Stand with your arms at your sides. Look straight ahead. Slowly pull both shoulders (scapulae) backward and downward (retraction) until you feel a stretch between your shoulder  blades in your upper back. Hold for 10-30 seconds. Relax and repeat. Repeat __________ times. Complete this exercise __________ times a day. Contact a health care provider if: Your neck pain or discomfort gets worse when you do an exercise. Your neck pain or discomfort does not improve within 2 hours after you exercise. If you have any of these problems, stop exercising right away. Do not do the exercises again unless your health care provider says that you can. Get help right away if: You develop sudden, severe neck pain. If this happens, stop exercising right away. Do not do the exercises again unless your health care provider says that you can. This information is not intended to replace advice given to you by your health care provider. Make sure you discuss any questions you have with your health care provider. Document Revised: 07/18/2020 Document Reviewed: 07/18/2020 Elsevier Patient Education  2023 Elsevier Inc.  

## 2021-07-26 NOTE — Addendum Note (Signed)
Addended by: Kizzie Furnish on: 07/26/2021 03:38 PM   Modules accepted: Orders

## 2021-07-26 NOTE — Progress Notes (Signed)
Subjective:    Patient ID: Kevin Perry, male    DOB: [birth year], 71 y.o.   MRN: [id number]  HPI  Patient presents to clinic today with complaint of right side neck pain. He reports this started 3 days ago. He describes the pain as stiffness. He reports associated right side ear pain and nasal congestion. He describes the pain as achy. He denies ringing in the ear, fullness or drainage.  He denies headache, runny nose, sore throat, cough or shortness of breath.  He denies any injury to the neck that he is aware of.  He has not tried anything OTC for this.  Review of Systems   Past Medical History:  Diagnosis Date   Anxiety    Arthritis    Coronary artery disease    Hypertensive retinopathy    Myocardial infarction [facility])    Postoperative nausea 11/27/2019    Current Outpatient Medications  Medication Sig Dispense Refill   acetaminophen (TYLENOL) 325 MG tablet Take by mouth. (Patient not taking: Reported on 11/15/2020)     amLODipine (NORVASC) 10 MG tablet Take 1 tablet (10 mg total) by mouth daily. 90 tablet 3   atorvastatin (LIPITOR) 40 MG tablet Take 1 tablet (40 mg total) by mouth daily. 90 tablet 3   buPROPion (WELLBUTRIN XL) 300 MG 24 hr tablet Take 1 tablet (300 mg total) by mouth daily. 90 tablet 1   doxycycline (VIBRA-TABS) 100 MG tablet Take 1 tablet (100 mg total) by mouth 2 (two) times daily. 20 tablet 0   Dulaglutide (TRULICITY) 3 ZD/6.3OV SOPN Inject 3 mg into the skin once a week.     folic acid (FOLVITE) 1 MG tablet Take 1 mg by mouth See admin instructions. Take 1 mg daily except skip dose on Wednesdays (methotrexate day)     furosemide (LASIX) 40 MG tablet TAKE 1 TABLET BY MOUTH ONCE DAILY AS NEEDED 90 tablet 1   gabapentin (NEURONTIN) 100 MG capsule Take 1 capsule (100 mg total) by mouth 2 (two) times daily. 180 capsule 3   lisinopril (ZESTRIL) 20 MG tablet Take 1 tablet by mouth daily.     loratadine (CLARITIN) 10 MG tablet Take 10 mg by mouth every morning.      metFORMIN (GLUCOPHAGE) 500 MG tablet Take 2 in morning with breakfast and take 1 tab in evening with dinner 270 tablet 3   methotrexate (RHEUMATREX) 2.5 MG tablet Take 7.5 mg by mouth every Wednesday.     metoprolol tartrate (LOPRESSOR) 25 MG tablet TAKE 1 TABLET BY MOUTH TWICE DAILY 180 tablet 1   nitroGLYCERIN (NITROSTAT) 0.4 MG SL tablet Place under the tongue.     oxyCODONE-acetaminophen (PERCOCET) 5-325 MG tablet Take 1 tablet by mouth every 4 (four) hours as needed for severe pain. 20 tablet 0   sertraline (ZOLOFT) 50 MG tablet Take 1 tablet (50 mg total) by mouth daily. 90 tablet 1   tamsulosin (FLOMAX) 0.4 MG CAPS capsule Take 1 capsule (0.4 mg total) by mouth daily. 90 capsule 3   No current facility-administered medications for this visit.    Allergies  Allergen Reactions   Naproxen Swelling and Anaphylaxis    No family history on file.  Social History   Socioeconomic History   Marital status: Married    Spouse name: Not on file   Number of children: Not on file   Years of education: Not on file   Highest education level: Not on file  Occupational History   Not on file  Tobacco Use   Smoking status: Former    Types: Cigarettes    Quit date: 08/16/2004    Years since quitting: 16.9   Smokeless tobacco: Former   Tobacco comments:    quit 20 plus years  Vaping Use   Vaping Use: Never used  Substance and Sexual Activity   Alcohol use: Not Currently    Alcohol/week: 2.0 standard drinks of alcohol    Types: 2 Cans of beer per week    Comment: SOCIAL   Drug use: No   Sexual activity: Not on file  Other Topics Concern   Not on file  Social History Narrative   Not on file   Social Determinants of Health   Financial Resource Strain: Not on file  Food Insecurity: Not on file  Transportation Needs: Not on file  Physical Activity: Not on file  Stress: Not on file  Social Connections: Not on file  Intimate Partner Violence: Not on file     Constitutional:  Denies fever, malaise, fatigue, headache or abrupt weight changes.  HEENT: Patient reports right ear pain.  Denies eye pain, eye redness, ringing in the ears, wax buildup, runny nose, nasal congestion, bloody nose, or sore throat. Respiratory: Denies difficulty breathing, shortness of breath, cough or sputum production.   Cardiovascular: Denies chest pain, chest tightness, palpitations or swelling in the hands or feet.  Musculoskeletal: Patient reports right-sided neck pain, decrease in range of motion.  Denies difficulty with gait, or joint pain and swelling.  Skin: Denies redness, rashes, lesions or ulcercations.    No other specific complaints in a complete review of systems (except as listed in HPI above).   Objective:   Physical Exam  BP 128/74 (BP Location: Left Arm, Patient Position: Sitting, Cuff Size: Large)   Pulse 61   Temp (!) 96.9 F (36.1 C) (Temporal)   Wt 262 lb (118.8 kg)   SpO2 98%   BMI 32.75 kg/m   Wt Readings from Last 3 Encounters:  07/18/21 268 lb 12.8 oz (121.9 kg)  06/20/21 267 lb 12.8 oz (121.5 kg)  03/08/21 268 lb 12.8 oz (121.9 kg)    General: Appears his stated age, obese, in NAD. Skin: Warm, dry and intact. No rashes noted of the right-sided neck. HEENT: Head: normal shape and size; Eyes: sclera white, and EOMs intact; Right Ear: Cerumen impaction;  Neck: No adenopathy noted. Cardiovascular: Normal rate and rhythm. Pulmonary/Chest: Normal effort and positive vesicular breath sounds. No respiratory distress. No wheezes, rales or ronchi noted.  Musculoskeletal: Normal flexion and rotation to the left of the cervical spine.  Decreased extension, rotation to the right and lateral bending bilaterally secondary to pain.  No bony tenderness noted over the cervical spine.  Pain with palpation over the right sternocleidomastoid.  Slight swelling of the right side of the neck.  Shoulder shrug equal. Neurological: Alert and oriented.    BMET    Component  Value Date/Time   NA 137 02/22/2021 1049   NA 136 09/19/2011 1203   K 3.8 02/22/2021 1049   K 3.7 09/19/2011 1203   CL 100 02/22/2021 1049   CL 101 09/19/2011 1203   CO2 31 02/22/2021 1049   CO2 26 09/19/2011 1203   GLUCOSE 200 (H) 02/22/2021 1049   GLUCOSE 185 (H) 09/19/2011 1203   BUN 13 02/22/2021 1049   BUN 14 09/19/2011 1203   CREATININE 0.88 02/22/2021 1049   CALCIUM 9.2 02/22/2021 1049   CALCIUM 8.8 09/19/2011 1203   GFRNONAA 79  03/02/2020 1130   GFRAA 92 03/02/2020 1130    Lipid Panel     Component Value Date/Time   CHOL 122 02/22/2021 1049   CHOL 153 07/27/2012 1335   TRIG 315 (H) 02/22/2021 1049   TRIG 212 (H) 07/27/2012 1335   HDL 38 (L) 02/22/2021 1049   HDL 34 (L) 07/27/2012 1335   CHOLHDL 3.2 02/22/2021 1049   VLDL 29 09/19/2015 1335   VLDL 42 (H) 07/27/2012 1335   LDLCALC 51 02/22/2021 1049   LDLCALC 77 07/27/2012 1335    CBC    Component Value Date/Time   WBC 6.3 02/22/2021 1049   RBC 4.61 02/22/2021 1049   HGB 15.2 02/22/2021 1049   HGB 14.9 06/20/2011 1445   HCT 43.7 02/22/2021 1049   HCT 42.8 06/20/2011 1445   PLT 186 02/22/2021 1049   PLT 170 06/20/2011 1445   MCV 94.8 02/22/2021 1049   MCV 93 06/20/2011 1445   MCH 33.0 02/22/2021 1049   MCHC 34.8 02/22/2021 1049   RDW 13.2 02/22/2021 1049   RDW 13.2 06/20/2011 1445   LYMPHSABS 2,249 02/22/2021 1049   LYMPHSABS 2.0 06/20/2011 1445   MONOABS 0.6 06/20/2011 1445   EOSABS 265 02/22/2021 1049   EOSABS 0.3 06/20/2011 1445   BASOSABS 50 02/22/2021 1049   BASOSABS 0.1 06/20/2011 1445    Hgb A1C Lab Results  Component Value Date   HGBA1C 7.3 (H) 02/22/2021           Assessment & Plan:   Right-Sided Neck Pain:  Seems muscular in origin Toradol 30 mg IM x1 Rx for Zanaflex 4 mg nightly as needed-sedation caution given Encouraged heat and massage Neck exercises given  Cerumen Impaction Right Ear:  Manual lavage by CMA Can try Debrox OTC to prevent further wax  buildup  Return precautions discussed   Webb Silversmith, NP

## 2021-07-27 ENCOUNTER — Encounter: Payer: Medicare HMO | Admitting: Podiatry

## 2021-07-29 DIAGNOSIS — I10 Essential (primary) hypertension: Secondary | ICD-10-CM | POA: Diagnosis not present

## 2021-07-29 DIAGNOSIS — E119 Type 2 diabetes mellitus without complications: Secondary | ICD-10-CM | POA: Diagnosis not present

## 2021-07-29 DIAGNOSIS — M542 Cervicalgia: Secondary | ICD-10-CM | POA: Diagnosis not present

## 2021-07-29 DIAGNOSIS — E785 Hyperlipidemia, unspecified: Secondary | ICD-10-CM | POA: Diagnosis not present

## 2021-07-29 DIAGNOSIS — R519 Headache, unspecified: Secondary | ICD-10-CM | POA: Diagnosis not present

## 2021-07-30 NOTE — Progress Notes (Signed)
   Subjective:  Patient presents today status post right third toe amputation. DOS: 07/05/2021.  Patient continues to do well.  With he has been weightbearing in the postsurgical shoe.  No new complaints at this time  Past Medical History:  Diagnosis Date   Anxiety    Arthritis    Coronary artery disease    Hypertensive retinopathy    Myocardial infarction [facility])    Postoperative nausea 11/27/2019   Past Surgical History:  Procedure Laterality Date   CATARACT EXTRACTION     CHOLECYSTECTOMY     CORONARY ANGIOPLASTY WITH STENT PLACEMENT N/A 02/04/2006   EYE SURGERY     FOOT SURGERY Left    DOS 8.1.14 HALLIX IPJ FUSION, SECOND MET OSTEOTOMY W/SCREW, HAM TOE REPAIR 2,,4 , PARTIAL AMP 3RD DIGIT    GALLBLADDER SURGERY     KNEE SURERY Right    LEFT HEART CATH AND CORONARY ANGIOGRAPHY Left 11/17/2019   Procedure: LEFT HEART CATH AND CORONARY ANGIOGRAPHY;  Surgeon: Dalia Heading, MD;  Location: [facility] INVASIVE CV LAB;  Service: Cardiovascular;  Laterality: Left;   TOTAL KNEE ARTHROPLASTY Right 08/30/2014   Procedure: TOTAL KNEE ARTHROPLASTY;  Surgeon: Kennedy Bucker, MD;  Location: [facility] ORS;  Service: Orthopedics;  Laterality: Right;   Allergies  Allergen Reactions   Naproxen Swelling and Anaphylaxis     Objective/Physical Exam Neurovascular status intact.  Skin incisions appear to be well coapted with sutures intact.  Moderate edema noted to the surgical amputation site.  Mild erythema  Radiographic Exam RT foot 07/13/2021:  Normal osseous mineralization.  Hallux valgus deformity noted.  Absence of the second and third digits at the level of the MTP joint noted.  No erosions.  No concern radiographically for osteomyelitis.  No fractures.  Assessment: 1. s/p right third toe amputation. DOS: 07/05/2021   Plan of Care:  1. Patient was evaluated.  2.  Sutures removed 3.  Continue oral doxycycline as prescribed until completed 4.  Patient may begin to transition out of the postsurgical shoe  into good supportive sneakers 5.  Return to clinic 2 weeks for follow-up x-ray  Felecia Shelling, DPM [facility]  Dr. Felecia Shelling, DPM    [address] [place], [place] [address]                Office [phone]  Fax [phone]

## 2021-08-01 ENCOUNTER — Ambulatory Visit (INDEPENDENT_AMBULATORY_CARE_PROVIDER_SITE_OTHER): Payer: Medicare HMO | Admitting: Pharmacist

## 2021-08-01 DIAGNOSIS — I10 Essential (primary) hypertension: Secondary | ICD-10-CM

## 2021-08-01 DIAGNOSIS — E1169 Type 2 diabetes mellitus with other specified complication: Secondary | ICD-10-CM

## 2021-08-01 NOTE — Chronic Care Management (AMB) (Signed)
Chronic Care Management CCM Pharmacy Note  08/01/2021 Name:  Kevin Perry MRN:  [id number] DOB:  January 31, 1951   Subjective: Kevin Perry is an 71 y.o. year old male who is a primary patient of Kevin Hauser, DO.  The CCM team was consulted for assistance with disease management and care coordination needs.    Engaged with patient by telephone for follow up visit for pharmacy case management and/or care coordination services.   Objective:  Medications Reviewed Today     Reviewed by Kevin Perry, RPH-CPP (Pharmacist) on 08/01/21 at 1333  Med List Status: <None>   Medication Order Taking? Sig Documenting Provider Last Dose Status Informant  acetaminophen (TYLENOL) 325 MG tablet [id number]  Take by mouth.  Patient not taking: Reported on 11/15/2020   [provider]  Active   amLODipine (NORVASC) 10 MG tablet [id number] Yes Take 1 tablet (10 mg total) by mouth daily. Kevin Hauser, DO Taking Active   atorvastatin (LIPITOR) 40 MG tablet [id number]  Take 1 tablet (40 mg total) by mouth daily. Karamalegos, Kevin Doughty, DO  Active   buPROPion (WELLBUTRIN XL) 300 MG 24 hr tablet [id number] Yes Take 1 tablet (300 mg total) by mouth daily. Kevin Hauser, DO Taking Active   Dulaglutide (TRULICITY) 3 IR/6.7EL [name] [id number] Yes Inject 3 mg into the skin once a week. [provider] Taking Active   folic acid (FOLVITE) 1 MG tablet [id number]  Take 1 mg by mouth See admin instructions. Take 1 mg daily except skip dose on Wednesdays (methotrexate day) [provider]  Active Self  furosemide (LASIX) 40 MG tablet [id number] Yes TAKE 1 TABLET BY MOUTH ONCE DAILY AS NEEDED [place] Ranger, Kevin Doughty, DO Taking Active   gabapentin (NEURONTIN) 100 MG capsule [id number]  Take 1 capsule (100 mg total) by mouth 2 (two) times daily. Karamalegos, Kevin Doughty, DO  Active   lisinopril (ZESTRIL) 20 MG tablet [id number] Yes Take 1 tablet by mouth daily.  [provider] Taking Active   loratadine (CLARITIN) 10 MG tablet [id number]  Take 10 mg by mouth every morning. [provider]  Active Self  meloxicam (MOBIC) 15 MG tablet [id number] Yes Take 15 mg by mouth daily. [provider] Taking Active   metFORMIN (GLUCOPHAGE) 500 MG tablet [id number] Yes Take 2 in morning with breakfast and take 1 tab in evening with dinner Kevin Hauser, DO Taking Active   methotrexate (RHEUMATREX) 2.5 MG tablet [id number]  Take 7.5 mg by mouth every Wednesday. [provider]  Active Self  metoprolol tartrate (LOPRESSOR) 25 MG tablet [id number] Yes TAKE 1 TABLET BY MOUTH TWICE DAILY Karamalegos, Kevin Doughty, DO Taking Active   nitroGLYCERIN (NITROSTAT) 0.4 MG SL tablet [id number]  Place under the tongue. [provider]  Active   oxyCODONE-acetaminophen (PERCOCET) 5-325 MG tablet [id number]  Take 1 tablet by mouth every 4 (four) hours as needed for severe pain. Kevin Perry, DPM  Active   sertraline (ZOLOFT) 50 MG tablet [id number]  Take 1 tablet (50 mg total) by mouth daily. Kevin Hauser, DO  Active   tamsulosin (FLOMAX) 0.4 MG CAPS capsule [id number]  Take 1 capsule (0.4 mg total) by mouth daily. Karamalegos, Kevin Doughty, DO  Active   tiZANidine (ZANAFLEX) 4 MG capsule [id number] Yes Take 1 capsule (4 mg total) by mouth at bedtime as needed for muscle spasms. Kevin Fenton, NP Taking Active             Pertinent  Labs:   Lab Results  Component Value Date   HGBA1C 7.3 (H) 02/22/2021   Lab Results  Component Value Date   CHOL 122 02/22/2021   HDL 38 (L) 02/22/2021   LDLCALC 51 02/22/2021   TRIG 315 (H) 02/22/2021   CHOLHDL 3.2 02/22/2021   Lab Results  Component Value Date   CREATININE 0.88 02/22/2021   BUN 13 02/22/2021   NA 137 02/22/2021   K 3.8 02/22/2021   CL 100 02/22/2021   CO2 31 02/22/2021   BP Readings from Last 3 Encounters:  07/26/21 128/74  07/18/21 140/80  06/20/21  140/78   Pulse Readings from Last 3 Encounters:  07/26/21 61  07/18/21 61  06/20/21 (!) 53     SDOH:  (Social Determinants of Health) assessments and interventions performed:    [place]  Review of patient past medical history, allergies, medications, health status, including review of consultants reports, laboratory and other test data, was performed as part of comprehensive evaluation and provision of chronic care management services.   Care Plan : PharmD - Med Assistance/Management  Updates made by Kevin Perry, RPH-CPP since 08/01/2021 12:00 AM     Problem: Disease Progression      Long-Range Goal: Disease Progression Prevented or Minimized   Start Date: 03/27/2020  Expected End Date: 06/25/2020  This Visit's Progress: On track  Recent Progress: On track  Priority: High  Note:   Current Barriers:  Unable to independently afford treatment regimen Patient APPROVED for Trulicity from [facility] Patient Assistance Program through 02/03/22  Pharmacist Clinical Goal(s):  Over the next 90 days, patient will verbalize ability to afford treatment regimen through collaboration with PharmD and provider.   Interventions: 1:1 collaboration with Kevin Hauser, DO regarding development and update of comprehensive plan of care as evidenced by provider attestation and co-signature Inter-disciplinary care team collaboration (see longitudinal plan of care) Perform chart review Patient seen in [facility] ED on 6/25 related to neck pain. Discharging provider advised patient: Rx sent for diazepam 5 MG - take 0.5-1 tablets every 8 hours as needed for muscle spasms for up to 5 days - not taking Rx sent for meloxicam 15 MG - take 1 tablet daily for 10 days Follow up with PCP Urgent Care Visit on 6/25 related to headache and dizziness. Patient sent to ED Office Visit with NP Webb Silversmith on 6/22 related to neck and ear pain Patient given Toradol 30 mg IM x1 Rx given for  Zanaflex 4 mg nightly as needed-sedation caution given Encouraged heat and massage Neck exercises given Office Visit with [facility] on 6/16 Office Visit with PCP on 6/14. Provider advised patient: First, Double the Wellbutrin XL from 150 daily to 2 pills at once for '300mg'$  daily. If this is successful and you run low on supply, message or call request new order of the '300mg'$  dose 1 tab daily Medication Taper Off Instructions for Current med - Escitalopram '20mg'$  Week 1-2: 10 mg daily (HALF tablet) Week 3: 10 mg every OTHER day (HALF tablet) - alternating day is NO medicine (skip dose) STOP completely After finished  - then start new medication Sertraline (Zoloft) '50mg'$  daily every day Today patient reports noticing some improvement in neck pain with taking meloxicam 15 mg daily as directed Denies taking diazepam due to preference/concern for side effects Reports also taking Zanaflex 4 mg nightly as needed for muscle spasm. Patient aware to use caution for potential sedation/dizziness Reports  his foot is healing well Reports following taper instruction for escitalopram as provided by PCP and plans to start sertraline as directed when taper off of escitalopram is complete  T2DM: Current treatment: Metformin IR '500mg'$  x 2 in AM / x 1 in PM Trulicity 3 mg weekly Reports tolerating well Previous therapies: glipizide Reports recent morning fasting readings ranging 110-130; today: 112 Denies symptoms of hypoglycemia Encourage patient to have regular well-balanced meals throughout the day and limit carbohydrate portion sizes Exercise: walking increased to ~30 minutes x twice daily x 6 days/week  Hypertension: Current treatment: Amlodipine '10mg'$  daily Furosemide 40 mg daily Lisinopril 20 mg once daily Metoprolol tartrate 25 mg BID Reports monitoring home BP using upper arm monitor. Recalls recent readings ranging: 120-130/78-83 Have counseled on impact of  salt/sodium on blood pressure Counsel patient to continue to monitor home BP, keep log of results including HR readings and bring this record to medical appointments  Hyperlipidemia: Controlled; current treatment: atorvastatin 40 mg daily   Patient Goals/Self-Care Activities Over the next 90 days, patient will:  - check glucose, document, and provide at future appointments - check blood pressure, document, and provide at future appointments - collaborate with provider on medication access solutions - attend medical appointments as scheduled  Follow Up Plan: Telephone follow up appointment with care management team member scheduled for: 10/31/2021 at 1:45 PM       Wallace Cullens, PharmD, [place], [place] [phone]

## 2021-08-01 NOTE — Patient Instructions (Signed)
Visit Information  Thank you for taking time to visit with me today. Please don't hesitate to contact me if I can be of assistance to you before our next scheduled telephone appointment.  Following are the goals we discussed today:   Goals Addressed             This Visit's Progress    Pharmacy Goals       Our goal A1c is less than 7%. This corresponds with fasting sugars less than 130 and 2 hour after meal sugars less than 180. Please check your blood sugar and keep log of the results  Our goal bad cholesterol, or LDL, is less than 70 . This is why it is important to continue taking your atorvastatin   Please check your home blood pressure, keep a log of the results and have this to review during our next appointment.  Feel free to call me with any questions or concerns. I look forward to our next call!  Wallace Cullens, PharmD, [place] [phone]         Our next appointment is by telephone on 10/31/2021 at 1:45 PM  Please call the care guide team at [phone] if you need to cancel or reschedule your appointment.    Patient verbalizes understanding of instructions and care plan provided today and agrees to view in [place]. Active MyChart status and patient understanding of how to access instructions and care plan via MyChart confirmed with patient.

## 2021-08-02 ENCOUNTER — Ambulatory Visit (INDEPENDENT_AMBULATORY_CARE_PROVIDER_SITE_OTHER): Payer: Medicare HMO | Admitting: Podiatry

## 2021-08-02 ENCOUNTER — Ambulatory Visit: Payer: Medicare HMO

## 2021-08-02 DIAGNOSIS — Z89421 Acquired absence of other right toe(s): Secondary | ICD-10-CM | POA: Diagnosis not present

## 2021-08-02 DIAGNOSIS — Z9889 Other specified postprocedural states: Secondary | ICD-10-CM

## 2021-08-03 DIAGNOSIS — E785 Hyperlipidemia, unspecified: Secondary | ICD-10-CM | POA: Diagnosis not present

## 2021-08-03 DIAGNOSIS — E1159 Type 2 diabetes mellitus with other circulatory complications: Secondary | ICD-10-CM

## 2021-08-03 DIAGNOSIS — Z7984 Long term (current) use of oral hypoglycemic drugs: Secondary | ICD-10-CM

## 2021-08-03 DIAGNOSIS — I10 Essential (primary) hypertension: Secondary | ICD-10-CM | POA: Diagnosis not present

## 2021-08-08 ENCOUNTER — Other Ambulatory Visit: Payer: Self-pay | Admitting: Family Medicine

## 2021-08-08 DIAGNOSIS — I10 Essential (primary) hypertension: Secondary | ICD-10-CM

## 2021-08-09 NOTE — Progress Notes (Signed)
   Subjective:  Patient presents today status post right third toe amputation. DOS: 07/05/2021.  Patient continues to do well.  With he has been weightbearing in the postsurgical shoe.  No new complaints at this time  Past Medical History:  Diagnosis Date   Anxiety    Arthritis    Coronary artery disease    Hypertensive retinopathy    Myocardial infarction [facility])    Postoperative nausea 11/27/2019   Past Surgical History:  Procedure Laterality Date   CATARACT EXTRACTION     CHOLECYSTECTOMY     CORONARY ANGIOPLASTY WITH STENT PLACEMENT N/A 02/04/2006   EYE SURGERY     FOOT SURGERY Left    DOS 8.1.14 HALLIX IPJ FUSION, SECOND MET OSTEOTOMY W/SCREW, HAM TOE REPAIR 2,,4 , PARTIAL AMP 3RD DIGIT    GALLBLADDER SURGERY     KNEE SURERY Right    LEFT HEART CATH AND CORONARY ANGIOGRAPHY Left 11/17/2019   Procedure: LEFT HEART CATH AND CORONARY ANGIOGRAPHY;  Surgeon: [name], MD;  Location: [facility];  Service: Cardiovascular;  Laterality: Left;   TOTAL KNEE ARTHROPLASTY Right 08/30/2014   Procedure: TOTAL KNEE ARTHROPLASTY;  Surgeon: [name], MD;  Location: [facility] ORS;  Service: Orthopedics;  Laterality: Right;   Allergies  Allergen Reactions   Naproxen Swelling and Anaphylaxis     Objective/Physical Exam Neurovascular status intact.  Skin incisions appear to be well coapted with sutures intact.  Moderate edema noted to the surgical amputation site.  Mild erythema  Radiographic Exam RT foot 07/13/2021:  Normal osseous mineralization.  Hallux valgus deformity noted.  Absence of the second and third digits at the level of the MTP joint noted.  No erosions.  No concern radiographically for osteomyelitis.  No fractures.  Assessment: 1. s/p right third toe amputation. DOS: 07/05/2021   Plan of Care:  1. Patient was evaluated.  2.  Sutures removed 3.  No further antibiotics as there is no clinical signs of infection. 4 4.  X-rays are within normal limits patient is  officially discharged from Dr. Amalia Hailey care if any foot and ankle issues on future of asked him to come back and see me.  He states understanding

## 2021-08-09 NOTE — Telephone Encounter (Signed)
Refilled 05/09/21 # 180 with 1 refill. Requested Prescriptions  Refused Prescriptions Disp Refills  . metoprolol tartrate (LOPRESSOR) 25 MG tablet [Pharmacy Med Name: METOPROLOL TARTRATE 25 MG TAB] 180 tablet 1    Sig: TAKE 1 TABLET BY MOUTH TWICE DAILY     Cardiovascular:  Beta Blockers Passed - 08/08/2021 10:57 AM      Passed - Last BP in normal range    BP Readings from Last 1 Encounters:  07/26/21 128/74         Passed - Last Heart Rate in normal range    Pulse Readings from Last 1 Encounters:  07/26/21 61         Passed - Valid encounter within last 6 months    Recent Outpatient Visits          2 weeks ago Neck pain on right side   [place], [place], [place]   3 weeks ago Major depressive disorder, recurrent, moderate ([place])   [place], DO   1 month ago Major depressive disorder, recurrent, moderate ([place])   [place], DO   5 months ago Annual physical exam   [place], DO   6 months ago Right leg pain   [place], [name], [place]

## 2021-08-10 ENCOUNTER — Encounter: Payer: Medicare HMO | Admitting: Podiatry

## 2021-08-19 DIAGNOSIS — U071 COVID-19: Secondary | ICD-10-CM | POA: Diagnosis not present

## 2021-08-27 ENCOUNTER — Ambulatory Visit (INDEPENDENT_AMBULATORY_CARE_PROVIDER_SITE_OTHER): Payer: Medicare HMO | Admitting: Family Medicine

## 2021-08-27 ENCOUNTER — Encounter: Payer: Self-pay | Admitting: Family Medicine

## 2021-08-27 VITALS — Ht 75.0 in | Wt 262.0 lb

## 2021-08-27 DIAGNOSIS — U099 Post covid-19 condition, unspecified: Secondary | ICD-10-CM

## 2021-08-27 DIAGNOSIS — J9801 Acute bronchospasm: Secondary | ICD-10-CM

## 2021-08-27 DIAGNOSIS — J0110 Acute frontal sinusitis, unspecified: Secondary | ICD-10-CM | POA: Diagnosis not present

## 2021-08-27 MED ORDER — GUAIFENESIN-CODEINE 100-10 MG/5ML PO SYRP: 5.0000 mL | ORAL_SOLUTION | Freq: Four times a day (QID) | ORAL | 0 refills | Status: DC | PRN

## 2021-08-27 MED ORDER — AMOXICILLIN-POT CLAVULANATE 875-125 MG PO TABS: 1.0000 | ORAL_TABLET | Freq: Two times a day (BID) | ORAL | 0 refills | Status: DC

## 2021-08-27 NOTE — Patient Instructions (Addendum)
   Please schedule a Follow-up Appointment to: Return if symptoms worsen or fail to improve.  If you have any other questions or concerns, please feel free to call the office or send a message through MyChart. You may also schedule an earlier appointment if necessary.  Additionally, you may be receiving a survey about your experience at our office within a few days to 1 week by e-mail or mail. We value your feedback.  Havannah Streat, DO [facility], [facility] 

## 2021-08-27 NOTE — Progress Notes (Signed)
Virtual Visit via Telephone The purpose of this virtual visit is to provide medical care while limiting exposure to the novel coronavirus (COVID19) for both patient and office staff.  Consent was obtained for phone visit:  Yes.   Answered questions that patient had about telehealth interaction:  Yes.   I discussed the limitations, risks, security and privacy concerns of performing an evaluation and management service by telephone. I also discussed with the patient that there may be a patient responsible charge related to this service. The patient expressed understanding and agreed to proceed.  Patient Location: Home Provider Location: [facility] (Office)  Participants in virtual visit: - Patient: [place]: Orinda Kenner, CMA - Provider: Dr Parks Ranger  ---------------------------------------------------------------------- Chief Complaint  Patient presents with   Nasal Congestion    S: Reviewed CMA documentation. I have called patient and gathered additional HPI as follows:  Post COVID 19 Syndrome Sinusitis Reports that symptoms started 08/16/21 while on vacation, cough congestion, dyspnea. Went to urgent care 08/19/21 treated with Paxlovid and cough medicine, nasal spray Now more persistent sinus symptoms, now for past 1 week with thicker yellow sputum - Tried OTC mucinex Sick contact also with COVID   Denies any fevers, chills, sweats, body ache, shortness of breath, headache, abdominal pain, diarrhea  Past Medical History:  Diagnosis Date   Anxiety    Arthritis    Coronary artery disease    Hypertensive retinopathy    Myocardial infarction [facility])    Postoperative nausea 11/27/2019   Social History   Tobacco Use   Smoking status: Former    Types: Cigarettes    Quit date: 08/16/2004    Years since quitting: 17.0   Smokeless tobacco: Former   Tobacco comments:    quit 20 plus years  Vaping Use   Vaping Use: Never used  Substance Use  Topics   Alcohol use: Not Currently    Alcohol/week: 2.0 standard drinks of alcohol    Types: 2 Cans of beer per week    Comment: SOCIAL   Drug use: No    Current Outpatient Medications:    amLODipine (NORVASC) 10 MG tablet, Take 1 tablet (10 mg total) by mouth daily., Disp: 90 tablet, Rfl: 3   amoxicillin-clavulanate (AUGMENTIN) 875-125 MG tablet, Take 1 tablet by mouth 2 (two) times daily., Disp: 20 tablet, Rfl: 0   atorvastatin (LIPITOR) 40 MG tablet, Take 1 tablet (40 mg total) by mouth daily., Disp: 90 tablet, Rfl: 3   buPROPion (WELLBUTRIN XL) 300 MG 24 hr tablet, Take 1 tablet (300 mg total) by mouth daily., Disp: 90 tablet, Rfl: 1   Dulaglutide (TRULICITY) 3 TI/4.5YK SOPN, Inject 3 mg into the skin once a week., Disp: , Rfl:    folic acid (FOLVITE) 1 MG tablet, Take 1 mg by mouth See admin instructions. Take 1 mg daily except skip dose on Wednesdays (methotrexate day), Disp: , Rfl:    furosemide (LASIX) 40 MG tablet, TAKE 1 TABLET BY MOUTH ONCE DAILY AS NEEDED, Disp: 90 tablet, Rfl: 1   gabapentin (NEURONTIN) 100 MG capsule, Take 1 capsule (100 mg total) by mouth 2 (two) times daily., Disp: 180 capsule, Rfl: 3   guaiFENesin-codeine (ROBITUSSIN AC) 100-10 MG/5ML syrup, Take 5-10 mLs by mouth 4 (four) times daily as needed for cough., Disp: 200 mL, Rfl: 0   lisinopril (ZESTRIL) 20 MG tablet, Take 1 tablet by mouth daily., Disp: , Rfl:    loratadine (CLARITIN) 10 MG tablet, Take 10  mg by mouth every morning., Disp: , Rfl:    meloxicam (MOBIC) 15 MG tablet, Take 15 mg by mouth daily., Disp: , Rfl:    metFORMIN (GLUCOPHAGE) 500 MG tablet, Take 2 in morning with breakfast and take 1 tab in evening with dinner, Disp: 270 tablet, Rfl: 3   methotrexate (RHEUMATREX) 2.5 MG tablet, Take 7.5 mg by mouth every Wednesday., Disp: , Rfl:    metoprolol tartrate (LOPRESSOR) 25 MG tablet, TAKE 1 TABLET BY MOUTH TWICE DAILY, Disp: 180 tablet, Rfl: 1   nitroGLYCERIN (NITROSTAT) 0.4 MG SL tablet, Place  under the tongue., Disp: , Rfl:    oxyCODONE-acetaminophen (PERCOCET) 5-325 MG tablet, Take 1 tablet by mouth every 4 (four) hours as needed for severe pain., Disp: 20 tablet, Rfl: 0   sertraline (ZOLOFT) 50 MG tablet, Take 1 tablet (50 mg total) by mouth daily., Disp: 90 tablet, Rfl: 1   tamsulosin (FLOMAX) 0.4 MG CAPS capsule, Take 1 capsule (0.4 mg total) by mouth daily., Disp: 90 capsule, Rfl: 3   tiZANidine (ZANAFLEX) 4 MG capsule, Take 1 capsule (4 mg total) by mouth at bedtime as needed for muscle spasms., Disp: 15 capsule, Rfl: 0   acetaminophen (TYLENOL) 325 MG tablet, Take by mouth. (Patient not taking: Reported on 11/15/2020), Disp: , Rfl:      07/18/2021    8:53 AM 06/20/2021    9:01 AM 03/08/2021    9:46 AM  Depression screen PHQ 2/9  Decreased Interest 3 3 0  Down, Depressed, Hopeless 2 3 0  PHQ - 2 Score 5 6 0  Altered sleeping 2 2 0  Tired, decreased energy 2 2 0  Change in appetite 0 1 0  Feeling bad or failure about yourself  0 1 0  Trouble concentrating 1 1 0  Moving slowly or fidgety/restless 0 0 0  Suicidal thoughts 0 0 0  PHQ-9 Score 10 13 0  Difficult doing work/chores Somewhat difficult Somewhat difficult Not difficult at all       07/18/2021    8:53 AM 06/20/2021    9:02 AM 08/30/2020   10:56 AM 07/17/2018    1:23 PM  GAD 7 : Generalized Anxiety Score  Nervous, Anxious, on Edge 0 0 1 0  Control/stop worrying 0 0 0 0  Worry too much - different things 0 0 0 0  Trouble relaxing 0 0 1 0  Restless 0 0 0 0  Easily annoyed or irritable 0 1 1 0  Afraid - awful might happen 0 0 0 0  Total GAD 7 Score 0 1 3 0  Anxiety Difficulty Not difficult at all Somewhat difficult Not difficult at all Not difficult at all    -------------------------------------------------------------------------- O: No physical exam performed due to remote telephone encounter.  Lab results reviewed.  Recent Results (from the past 2160 hour(s))  WOUND CULTURE     Status: None    Collection Time: 06/29/21 12:00 AM   Specimen: Wound   Wound SENSITIVITY  Result Value Ref Range   Gram Stain Result Final report    Organism ID, Bacteria Comment     Comment: No white blood cells seen.   Organism ID, Bacteria Comment     Comment: Few gram positive rods.   Aerobic Bacterial Culture Final report    Organism ID, Bacteria Mixed skin flora     Comment: Heavy growth  Specimen status report     Status: None   Collection Time: 06/29/21 12:00 AM  Result Value Ref  Range   specimen status report Comment     Comment: Please note Please note The date and/or time of collection was not indicated on the requisition as required by state and federal law.  The date of receipt of the specimen was used as the collection date if not supplied.     -------------------------------------------------------------------------- A&P:  Problem List Items Addressed This Visit   None Visit Diagnoses     Acute non-recurrent frontal sinusitis    -  Primary   Relevant Medications   amoxicillin-clavulanate (AUGMENTIN) 875-125 MG tablet   guaiFENesin-codeine (ROBITUSSIN AC) 100-10 MG/5ML syrup   Bronchospasm, acute       Relevant Medications   guaiFENesin-codeine (ROBITUSSIN AC) 100-10 MG/5ML syrup   Post-COVID syndrome          Post COVID 19 Syndrome Treated for COVID w Paxlovid 1 week ago Improved but now has secondary sinusitis Still with bronchospasm cough worse episodic Order Augmentin course for sinuses Rx codeine cough Syrup PRN now Continue symptomatic relief F/u if not improve   Meds ordered this encounter  Medications   amoxicillin-clavulanate (AUGMENTIN) 875-125 MG tablet    Sig: Take 1 tablet by mouth 2 (two) times daily.    Dispense:  20 tablet    Refill:  0   guaiFENesin-codeine (ROBITUSSIN AC) 100-10 MG/5ML syrup    Sig: Take 5-10 mLs by mouth 4 (four) times daily as needed for cough.    Dispense:  200 mL    Refill:  0    Follow-up: - Return in 1 week  PRN  Patient verbalizes understanding with the above medical recommendations including the limitation of remote medical advice.  Specific follow-up and call-back criteria were given for patient to follow-up or seek medical care more urgently if needed.   - Time spent in direct consultation with patient on phone: 8 minutes  [name], [facility] 08/27/2021, 11:36 AM

## 2021-08-28 ENCOUNTER — Telehealth: Payer: Self-pay

## 2021-08-28 NOTE — Telephone Encounter (Signed)
I left a message for the patient to call back and schedule their Medicare Annual Wellness Visit (AWV) virtually, by telephone, or face-to-face.   Lylah Lantis, CMA [phone]  

## 2021-09-03 ENCOUNTER — Other Ambulatory Visit: Payer: Self-pay | Admitting: Family Medicine

## 2021-09-03 DIAGNOSIS — F331 Major depressive disorder, recurrent, moderate: Secondary | ICD-10-CM

## 2021-09-03 DIAGNOSIS — I872 Venous insufficiency (chronic) (peripheral): Secondary | ICD-10-CM

## 2021-09-05 NOTE — Telephone Encounter (Signed)
Requested medication (s) are due for refill today: no  Requested medication (s) are on the active medication list: yes  Last refill:  05/31/21 and 07/18/21  Future visit scheduled:yes  Notes to clinic:  Unable to refill per protocol, requests are too soon. Last refill for Lasix is4/27/23 for 90 days and 1 refill. Last refill for Wellbutrin was on 07/18/21 for 90 and 1 refill.      Requested Prescriptions  Pending Prescriptions Disp Refills   furosemide (LASIX) 40 MG tablet [Pharmacy Med Name: FUROSEMIDE 40 MG TAB] 90 tablet 1    Sig: TAKE 1 TABLET BY MOUTH ONCE DAILY AS NEEDED     Cardiovascular:  Diuretics - Loop Failed - 09/03/2021  5:36 PM      Failed - K in normal range and within 180 days    Potassium  Date Value Ref Range Status  02/22/2021 3.8 3.5 - 5.3 mmol/L Final  09/19/2011 3.7 3.5 - 5.1 mmol/L Final         Failed - Ca in normal range and within 180 days    Calcium  Date Value Ref Range Status  02/22/2021 9.2 8.6 - 10.3 mg/dL Final   Calcium, Total  Date Value Ref Range Status  09/19/2011 8.8 8.5 - 10.1 mg/dL Final         Failed - Na in normal range and within 180 days    Sodium  Date Value Ref Range Status  02/22/2021 137 135 - 146 mmol/L Final  09/19/2011 136 136 - 145 mmol/L Final         Failed - Cr in normal range and within 180 days    Creat  Date Value Ref Range Status  02/22/2021 0.88 0.70 - 1.28 mg/dL Final   Creatinine, POC  Date Value Ref Range Status  03/09/2015 0 mg/dL Final         Failed - Cl in normal range and within 180 days    Chloride  Date Value Ref Range Status  02/22/2021 100 98 - 110 mmol/L Final  09/19/2011 101 98 - 107 mmol/L Final         Failed - Mg Level in normal range and within 180 days    No results found for: "MG"       Passed - Last BP in normal range    BP Readings from Last 1 Encounters:  07/26/21 128/74         Passed - Valid encounter within last 6 months    Recent Outpatient Visits           1  week ago Acute non-recurrent frontal sinusitis   [place], DO   1 month ago Neck pain on right side   [facility] [place], [place], [place]   1 month ago Major depressive disorder, recurrent, moderate ([place])   [place], DO   2 months ago Major depressive disorder, recurrent, moderate ([place])   [place], DO   6 months ago Annual physical exam   [facility] [name], DO               buPROPion (WELLBUTRIN XL) 150 MG 24 hr tablet [Pharmacy Med Name: BUPROPION HCL ER (XL) 150 MG TAB] 90 tablet 1    Sig: TAKE 1 TABLET BY MOUTH ONCE DAILY     Psychiatry: Antidepressants - bupropion Passed - 09/03/2021  5:36  PM      Passed - Cr in normal range and within 360 days    Creat  Date Value Ref Range Status  02/22/2021 0.88 0.70 - 1.28 mg/dL Final   Creatinine, POC  Date Value Ref Range Status  03/09/2015 0 mg/dL Final         Passed - AST in normal range and within 360 days    AST  Date Value Ref Range Status  02/22/2021 16 10 - 35 U/L Final   SGOT(AST)  Date Value Ref Range Status  09/19/2011 30 15 - 37 Unit/L Final         Passed - ALT in normal range and within 360 days    ALT  Date Value Ref Range Status  02/22/2021 22 9 - 46 U/L Final   SGPT (ALT)  Date Value Ref Range Status  09/19/2011 53 12 - 78 U/L Final         Passed - Completed PHQ-2 or PHQ-9 in the last 360 days      Passed - Last BP in normal range    BP Readings from Last 1 Encounters:  07/26/21 128/74         Passed - Valid encounter within last 6 months    Recent Outpatient Visits           1 week ago Acute non-recurrent frontal sinusitis   [place], DO   1 month ago Neck pain on right side   [place], [place], NP   1 month ago Major depressive disorder,  recurrent, moderate ([place])   [place], DO   2 months ago Major depressive disorder, recurrent, moderate ([place])   [place], [name], DO   6 months ago Annual physical exam   [place], [name], DO

## 2021-10-01 DIAGNOSIS — H90A31 Mixed conductive and sensorineural hearing loss, unilateral, right ear with restricted hearing on the contralateral side: Secondary | ICD-10-CM | POA: Diagnosis not present

## 2021-10-01 DIAGNOSIS — H6981 Other specified disorders of Eustachian tube, right ear: Secondary | ICD-10-CM | POA: Diagnosis not present

## 2021-10-01 DIAGNOSIS — H6121 Impacted cerumen, right ear: Secondary | ICD-10-CM | POA: Diagnosis not present

## 2021-10-01 DIAGNOSIS — J301 Allergic rhinitis due to pollen: Secondary | ICD-10-CM | POA: Diagnosis not present

## 2021-10-01 DIAGNOSIS — J342 Deviated nasal septum: Secondary | ICD-10-CM | POA: Diagnosis not present

## 2021-10-17 DIAGNOSIS — H6981 Other specified disorders of Eustachian tube, right ear: Secondary | ICD-10-CM | POA: Diagnosis not present

## 2021-10-18 ENCOUNTER — Encounter: Payer: Self-pay | Admitting: Otolaryngology

## 2021-10-18 NOTE — Discharge Instructions (Signed)
[facility] DISCHARGE INSTRUCTIONS FOR MYRINGOTOMY AND TUBE INSERTION  [facility] PAUL JUENGEL, M.D.  Diet:   After surgery, the patient should take only liquids and foods as tolerated.  The patient may then have a regular diet after the effects of anesthesia have worn off, usually about four to six hours after surgery.  Activities:   The patient should rest until the effects of anesthesia have worn off.  After this, there are no restrictions on the normal daily activities.  Medications:   You will be given a prescription for antibiotic drops to be used in the ears postoperatively.  It is recommended to use 3 drops 3 times a day for 3 days, then the drops should be saved for possible future use.  The tubes should not cause any discomfort to the patient, but if there is any question, Tylenol should be given according to the instructions for the age of the patient.  Other medications should be continued normally.  Precautions:   Should there be recurrent drainage after the tubes are placed, the drops should be used for approximately 3-4 days.  If it does not clear, you should call the ENT office.  Earplugs:   Earplugs are only needed for those who are going to be submerged under water.  When taking a bath or shower and using a cup or showerhead to rinse hair, it is not necessary to wear earplugs.  These come in a variety of fashions, all of which can be obtained at our office.  However, if one is not able to come by the office, then silicone plugs can be found at most pharmacies.  It is not advised to stick anything in the ear that is not approved as an earplug.  Silly putty is not to be used as an earplug.  Swimming is allowed in patients after ear tubes are inserted, however, they must wear earplugs if they are going to be submerged under water.  For those children who are going to be swimming a lot, it is recommended to use a fitted ear mold, which can be made by  our audiologist.  If discharge is noticed from the ears, this most likely represents an ear infection.  We would recommend getting your eardrops and using them as indicated above.  If it does not clear, then you should call the ENT office.  For follow up, the patient should return to the ENT office three weeks postoperatively and then every six months as required by the doctor. 

## 2021-10-19 ENCOUNTER — Other Ambulatory Visit: Payer: Self-pay

## 2021-10-19 MED ORDER — CIPROFLOXACIN-DEXAMETHASONE 0.3-0.1 % OT SUSP
OTIC | 0 refills | Status: DC
  Filled 2021-10-19: qty 7.5, 10d supply, fill #0

## 2021-10-22 ENCOUNTER — Other Ambulatory Visit: Payer: Self-pay

## 2021-10-25 ENCOUNTER — Encounter: Payer: Self-pay | Admitting: Otolaryngology

## 2021-10-25 ENCOUNTER — Ambulatory Visit (AMBULATORY_SURGERY_CENTER): Payer: Medicare HMO | Admitting: Anesthesiology

## 2021-10-25 ENCOUNTER — Ambulatory Visit
Admission: RE | Admit: 2021-10-25 | Discharge: 2021-10-25 | Disposition: A | Discharge status: Home / Self Care | Payer: Medicare HMO | Attending: Otolaryngology | Admitting: Otolaryngology

## 2021-10-25 ENCOUNTER — Other Ambulatory Visit: Payer: Self-pay

## 2021-10-25 ENCOUNTER — Ambulatory Visit: Payer: Medicare HMO | Admitting: Anesthesiology

## 2021-10-25 ENCOUNTER — Encounter
Admission: RE | Disposition: A | Discharge status: Home / Self Care | Payer: Self-pay | Source: Home / Self Care | Attending: Otolaryngology

## 2021-10-25 DIAGNOSIS — Z96651 Presence of right artificial knee joint: Secondary | ICD-10-CM | POA: Diagnosis not present

## 2021-10-25 DIAGNOSIS — H6991 Unspecified Eustachian tube disorder, right ear: Secondary | ICD-10-CM | POA: Insufficient documentation

## 2021-10-25 DIAGNOSIS — H73891 Other specified disorders of tympanic membrane, right ear: Secondary | ICD-10-CM | POA: Diagnosis not present

## 2021-10-25 DIAGNOSIS — Z9049 Acquired absence of other specified parts of digestive tract: Secondary | ICD-10-CM | POA: Diagnosis not present

## 2021-10-25 DIAGNOSIS — I10 Essential (primary) hypertension: Secondary | ICD-10-CM | POA: Diagnosis not present

## 2021-10-25 DIAGNOSIS — Z7985 Long-term (current) use of injectable non-insulin antidiabetic drugs: Secondary | ICD-10-CM | POA: Insufficient documentation

## 2021-10-25 DIAGNOSIS — I2510 Atherosclerotic heart disease of native coronary artery without angina pectoris: Secondary | ICD-10-CM | POA: Insufficient documentation

## 2021-10-25 DIAGNOSIS — F419 Anxiety disorder, unspecified: Secondary | ICD-10-CM

## 2021-10-25 DIAGNOSIS — H35039 Hypertensive retinopathy, unspecified eye: Secondary | ICD-10-CM | POA: Insufficient documentation

## 2021-10-25 DIAGNOSIS — H6521 Chronic serous otitis media, right ear: Secondary | ICD-10-CM

## 2021-10-25 DIAGNOSIS — H6981 Other specified disorders of Eustachian tube, right ear: Secondary | ICD-10-CM | POA: Diagnosis not present

## 2021-10-25 DIAGNOSIS — Z7984 Long term (current) use of oral hypoglycemic drugs: Secondary | ICD-10-CM | POA: Insufficient documentation

## 2021-10-25 DIAGNOSIS — E119 Type 2 diabetes mellitus without complications: Secondary | ICD-10-CM | POA: Insufficient documentation

## 2021-10-25 DIAGNOSIS — I252 Old myocardial infarction: Secondary | ICD-10-CM | POA: Diagnosis not present

## 2021-10-25 HISTORY — PX: MYRINGOTOMY WITH TUBE PLACEMENT: SHX5663

## 2021-10-25 LAB — GLUCOSE, CAPILLARY
Glucose-Capillary: 176 mg/dL — ABNORMAL HIGH (ref 70–99)
Glucose-Capillary: 169 mg/dL — ABNORMAL HIGH (ref 70–99)

## 2021-10-25 SURGERY — MYRINGOTOMY WITH TUBE PLACEMENT
Anesthesia: General | Site: Ear | Laterality: Right

## 2021-10-25 MED ORDER — CIPROFLOXACIN-DEXAMETHASONE 0.3-0.1 % OT SUSP
OTIC | Status: DC | PRN
  Administered 2021-10-25: 1 [drp] via OTIC

## 2021-10-25 MED ORDER — LACTATED RINGERS IV SOLN: INTRAVENOUS | Status: DC

## 2021-10-25 MED ORDER — PROPOFOL 10 MG/ML IV BOLUS
INTRAVENOUS | Status: DC | PRN
  Administered 2021-10-25 (×2): 100 mg via INTRAVENOUS

## 2021-10-25 MED ORDER — LIDOCAINE HCL (CARDIAC) PF 100 MG/5ML IV SOSY
PREFILLED_SYRINGE | INTRAVENOUS | Status: DC | PRN
  Administered 2021-10-25: 30 mg via INTRAVENOUS

## 2021-10-25 MED ORDER — ONDANSETRON HCL 4 MG/2ML IJ SOLN
INTRAMUSCULAR | Status: DC | PRN
  Administered 2021-10-25: 4 mg via INTRAVENOUS

## 2021-10-25 SURGICAL SUPPLY — 10 items
BALL CTTN LRG ABS STRL LF (GAUZE/BANDAGES/DRESSINGS) ×1
BLADE MYRINGOTOMY 6 SPEAR HDL (BLADE) ×1 IMPLANT
CANISTER SUCT 1200ML W/VALVE (MISCELLANEOUS) ×1 IMPLANT
COTTONBALL LRG STERILE PKG (GAUZE/BANDAGES/DRESSINGS) ×1 IMPLANT
GLOVE SURG GAMMEX PI TX LF 7.5 (GLOVE) ×1 IMPLANT
RICHARDS MODIFIED T-TUBE IMPLANT
STRAP BODY AND KNEE 60X3 (MISCELLANEOUS) ×1 IMPLANT
TOWEL OR 17X26 4PK STRL BLUE (TOWEL DISPOSABLE) ×1 IMPLANT
TUBING CONN 6MMX3.1M (TUBING) ×1
TUBING SUCTION CONN 0.25 STRL (TUBING) ×1 IMPLANT

## 2021-10-25 NOTE — Anesthesia Preprocedure Evaluation (Addendum)
Anesthesia Evaluation  Patient identified by MRN, date of birth, ID band Patient awake    Reviewed: Allergy & Precautions, NPO status , Patient's Chart, lab work & pertinent test results, reviewed documented beta blocker date and time   Airway Mallampati: III  TM Distance: >3 FB Neck ROM: full    Dental  (+) Upper Dentures, Lower Dentures   Pulmonary neg pulmonary ROS, former smoker,    Pulmonary exam normal        Cardiovascular Exercise Tolerance: Good hypertension, Pt. on medications and Pt. on home beta blockers + CAD, + Past MI and + CABG (S/P CABG x 4)  + dysrhythmias Atrial Fibrillation  Rhythm:Regular Rate:Normal     Neuro/Psych PSYCHIATRIC DISORDERS Anxiety Moderate non-proliferative diabetic retinopathy    GI/Hepatic negative GI ROS, Neg liver ROS,   Endo/Other  diabetes, Well Controlled, Type 2  Renal/GU      Musculoskeletal   Abdominal (+) + obese,   Peds  Hematology negative hematology ROS (+)   Anesthesia Other Findings RIGHT EUSTACHIAN TUBE DYSFUNCTION  Past Medical History: No date: Anxiety No date: Arthritis No date: Coronary artery disease No date: Diabetes mellitus without complication (HCC) No date: Hypertension No date: Hypertensive retinopathy No date: Myocardial infarction (HCC) 11/27/2019: Postoperative nausea  Past Surgical History: No date: CATARACT EXTRACTION No date: CHOLECYSTECTOMY 02/04/2006: CORONARY ANGIOPLASTY WITH STENT PLACEMENT; N/A No date: EYE SURGERY No date: FOOT SURGERY; Left     Comment:  DOS 8.1.14 HALLIX IPJ FUSION, SECOND MET OSTEOTOMY               W/SCREW, HAM TOE REPAIR 2,,4 , PARTIAL AMP 3RD DIGIT  No date: GALLBLADDER SURGERY No date: KNEE SURERY; Right 11/17/2019: LEFT HEART CATH AND CORONARY ANGIOGRAPHY; Left     Comment:  Procedure: LEFT HEART CATH AND CORONARY ANGIOGRAPHY;                Surgeon: Fath, Kenneth A, MD;  Location: [facility] INVASIVE CV               LAB;  Service: Cardiovascular;  Laterality: Left; 08/30/2014: TOTAL KNEE ARTHROPLASTY; Right     Comment:  Procedure: TOTAL KNEE ARTHROPLASTY;  Surgeon: Michael               Menz, MD;  Location: [facility] ORS;  Service: Orthopedics;                Laterality: Right;  BMI    Body Mass Index: 34.30 kg/m      Reproductive/Obstetrics negative OB ROS                            Anesthesia Physical Anesthesia Plan  ASA: 3  Anesthesia Plan: General   Post-op Pain Management: Minimal or no pain anticipated   Induction: Intravenous  PONV Risk Score and Plan: Treatment may vary due to age or medical condition, Propofol infusion and TIVA  Airway Management Planned: Natural Airway and Mask  Additional Equipment:   Intra-op Plan:   Post-operative Plan:   Informed Consent: I have reviewed the patients History and Physical, chart, labs and discussed the procedure including the risks, benefits and alternatives for the proposed anesthesia with the patient or authorized representative who has indicated his/her understanding and acceptance.     Dental Advisory Given  Plan Discussed with: Anesthesiologist, CRNA and Surgeon  Anesthesia Plan Comments:        Anesthesia Quick Evaluation  

## 2021-10-25 NOTE — H&P (Signed)
H&P has been reviewed and patient reevaluated, no changes necessary. To be downloaded later.  

## 2021-10-25 NOTE — Transfer of Care (Signed)
Immediate Anesthesia Transfer of Care Note  Patient: [place]  Procedure(s) Performed: MYRINGOTOMY WITH BUTTERFLY TUBE PLACEMENT (Right: Ear)  Patient Location: PACU  Anesthesia Type: General  Level of Consciousness: awake, alert  and patient cooperative  Airway and Oxygen Therapy: Patient Spontanous Breathing and Patient connected to supplemental oxygen  Post-op Assessment: Post-op Vital signs reviewed, Patient's Cardiovascular Status Stable, Respiratory Function Stable, Patent Airway and No signs of Nausea or vomiting  Post-op Vital Signs: Reviewed and stable  Complications: No notable events documented.

## 2021-10-25 NOTE — Op Note (Signed)
10/25/2021  9:24 AM    Cherylann Parr  [id number]   Pre-Op Dx: Verdie Drown tube dysfunction of the right ear with chronic serous otitis media  Post-op Dx: Eustachian tube dysfunction of the right ear, with chronic serous otitis media and an atelectatic eardrum  Proc: Right myringotomy with placement of T-tube   Surg: Huey Romans  Anes:  General by mask  EBL:  None  Comp: None  Findings: The eardrum was very thin and stretched out and lying against the promontory and overlying the bones of the middle ear.  The incision had to be made very anteriorly where some of the eardrum was pulling up onto the eardrum and there was a small space there.  I suctioned out the fluid and worked hard to make sure that both wings of the T-tube were covered over by the thin eardrum so it would stay in there longer.  Procedure: With the patient in a comfortable supine position, general mask anesthesia was administered.  At an appropriate level, microscope and speculum were used to examine and clean the RIGHT ear canal.  The findings were as described above.  An anterior inferior radial myringotomy incision was sharply executed.  Middle ear contents were suctioned clear.  A T tube was placed without difficulty.  Ciprodex otic solution was instilled into the external canal, and insufflated into the middle ear.  A cotton ball was placed at the external meatus. Hemostasis was observed.  This side was completed.    Following this  The patient was returned to anesthesia, awakened, and transferred to recovery in stable condition.  Dispo:  PACU to home  Plan: Routine drop use and water precautions.  Recheck my office 2 or three weeks with audiogram.   Huey Romans 9:24 AM 10/25/2021

## 2021-10-25 NOTE — Anesthesia Postprocedure Evaluation (Signed)
Anesthesia Post Note  Patient: Kevin Perry  Procedure(s) Performed: MYRINGOTOMY WITH BUTTERFLY TUBE PLACEMENT (Right: Ear)     Patient location during evaluation: PACU Anesthesia Type: General Level of consciousness: awake and alert Pain management: pain level controlled Vital Signs Assessment: post-procedure vital signs reviewed and stable Respiratory status: spontaneous breathing, nonlabored ventilation and respiratory function stable Cardiovascular status: blood pressure returned to baseline and stable Postop Assessment: no apparent nausea or vomiting Anesthetic complications: no   No notable events documented.  [place] Ouch

## 2021-10-26 ENCOUNTER — Encounter: Payer: Self-pay | Admitting: Otolaryngology

## 2021-10-31 ENCOUNTER — Telehealth: Payer: Medicare HMO

## 2021-10-31 ENCOUNTER — Telehealth: Payer: Self-pay | Admitting: Pharmacist

## 2021-10-31 NOTE — Telephone Encounter (Signed)
  Chronic Care Management   Outreach Note  10/31/2021 Name: Kevin Perry MRN: [id number] DOB: [birth year]  Referred by: [name], DO Reason for referral : No chief complaint on file.   Was unable to reach patient via telephone today and have left HIPAA compliant voicemail asking patient to return my call.    Follow Up Plan: Will collaborate with Care Guide to outreach to schedule follow up with me  Wallace Cullens, PharmD, [facility] [phone]

## 2021-11-02 ENCOUNTER — Telehealth: Payer: Self-pay

## 2021-11-02 NOTE — Chronic Care Management (AMB) (Signed)
  Care Coordination Note  11/02/2021 Name: Kevin Perry MRN: [id number] DOB: 05-Oct-1950  Kevin Perry is a 71 y.o. year old male who is a primary care patient of [name], DO and is actively engaged with the care management team. I reached out to [employer] by phone today to assist with re-scheduling a follow up visit with the Pharmacist  Follow up plan: Unsuccessful telephone outreach attempt made. A HIPAA compliant phone message was left for the patient providing contact information and requesting a return call.  The care management team will reach out to the patient again over the next 7 days.  If patient returns call to provider office, please advise to call [facility]  at [place], [place], [place] [address] Direct Dial: [phone] [email or web address]

## 2021-11-08 NOTE — Chronic Care Management (AMB) (Signed)
  Care Coordination Note  11/08/2021 Name: LOUDEN HOUSEWORTH MRN: [id number] DOB: 09/09/1950  CALVYN KURTZMAN is a 71 y.o. year old male who is a primary care patient of [name], DO and is actively engaged with the care management team. I reached out to [employer] by phone today to assist with re-scheduling a follow up visit with the Pharmacist  Follow up plan: Unsuccessful telephone outreach attempt made. A HIPAA compliant phone message was left for the patient providing contact information and requesting a return call.  The care management team will reach out to the patient again over the next 7 days.  If patient returns call to provider office, please advise to call [place]  at [place], [place],  [address] Direct Dial: [phone] [email or web address]

## 2021-11-12 ENCOUNTER — Other Ambulatory Visit: Payer: Self-pay | Admitting: Family Medicine

## 2021-11-12 DIAGNOSIS — H6981 Other specified disorders of Eustachian tube, right ear: Secondary | ICD-10-CM | POA: Diagnosis not present

## 2021-11-12 DIAGNOSIS — I10 Essential (primary) hypertension: Secondary | ICD-10-CM

## 2021-11-12 DIAGNOSIS — H90A31 Mixed conductive and sensorineural hearing loss, unilateral, right ear with restricted hearing on the contralateral side: Secondary | ICD-10-CM | POA: Diagnosis not present

## 2021-11-13 NOTE — Telephone Encounter (Signed)
Requested medication (s) are due for refill today: yes  Requested medication (s) are on the active medication list: yes  Last refill:  05/09/21 #180/1  Future visit scheduled: no  Notes to clinic:  pt is due for fu appt for HTN back in 08/23. Pt called, LVMTCB     Requested Prescriptions  Pending Prescriptions Disp Refills   metoprolol tartrate (LOPRESSOR) 25 MG tablet [Pharmacy Med Name: METOPROLOL TARTRATE 25 MG TAB] 180 tablet 1    Sig: TAKE 1 TABLET BY MOUTH TWICE DAILY     Cardiovascular:  Beta Blockers Failed - 11/12/2021 10:39 AM      Failed - Last BP in normal range    BP Readings from Last 1 Encounters:  10/25/21 (!) 154/80         Passed - Last Heart Rate in normal range    Pulse Readings from Last 1 Encounters:  10/25/21 64         Passed - Valid encounter within last 6 months    Recent Outpatient Visits           2 months ago Acute non-recurrent frontal sinusitis   [place], DO   3 months ago Neck pain on right side   [facility] [place], [place] W, NP   3 months ago Major depressive disorder, recurrent, moderate ([place])   [place], DO   4 months ago Major depressive disorder, recurrent, moderate ([place])   [facility] [place], [name], DO   8 months ago Annual physical exam   [place], [name], DO

## 2021-11-13 NOTE — Telephone Encounter (Signed)
Patient called, left VM to return the call to the office to scheduled an appt for medication refill request.   

## 2021-11-16 NOTE — Chronic Care Management (AMB) (Signed)
  Care Coordination Note  11/16/2021 Name: Kevin Perry MRN: [id number] DOB: 1950/09/22  Kevin Perry is a 71 y.o. year old male who is a primary care patient of Kevin Hauser, DO and is actively engaged with the care management team. I reached out to [employer] by phone today to assist with re-scheduling a follow up visit with the Pharmacist  Follow up plan: Unable to make contact on outreach attempts x 3. PCP Kevin Hauser, DO notified via routed documentation in medical record.   Noreene Larsson, [place],  [address] Direct Dial: [phone] [email or web address]

## 2021-11-26 ENCOUNTER — Telehealth: Payer: Self-pay | Admitting: Pharmacist

## 2021-11-26 NOTE — Telephone Encounter (Signed)
   Outreach Note  11/26/2021 Name: Kevin Perry MRN: [id number] DOB: [birth year]  Referred by: [name], DO Reason for referral : No chief complaint on file.   Reach patient by telephone today. Reports that he is currently not feeling well - feeling achy and tired, but attributes this to having received influenza and RSV vaccinations over the weekend. Reports will contact office if symptoms do not improve or new symptoms occur.  Reschedule missed appointment with Clinical Pharmacist.  Follow Up Plan: Will outreach to patient by telephone on 12/03/2021 at 1:45 pm  Wallace Cullens, PharmD, [facility] [place] [phone]

## 2021-11-29 DIAGNOSIS — I10 Essential (primary) hypertension: Secondary | ICD-10-CM | POA: Diagnosis not present

## 2021-11-29 DIAGNOSIS — E118 Type 2 diabetes mellitus with unspecified complications: Secondary | ICD-10-CM | POA: Diagnosis not present

## 2021-11-29 DIAGNOSIS — I2510 Atherosclerotic heart disease of native coronary artery without angina pectoris: Secondary | ICD-10-CM | POA: Diagnosis not present

## 2021-11-29 DIAGNOSIS — Z951 Presence of aortocoronary bypass graft: Secondary | ICD-10-CM | POA: Diagnosis not present

## 2021-12-03 ENCOUNTER — Telehealth: Payer: Medicare HMO

## 2021-12-03 ENCOUNTER — Telehealth: Payer: Self-pay | Admitting: Pharmacist

## 2021-12-03 NOTE — Telephone Encounter (Signed)
   Outreach Note  12/03/2021 Name: Kevin Perry MRN: [id number] DOB: 07-Nov-1950  Referred by: [name], DO Reason for referral : No chief complaint on file.   Was unable to reach patient via telephone today and have left HIPAA compliant voicemail asking patient to return my call.    Follow Up Plan: Will collaborate with Care Guide to outreach to schedule follow up with me  Wallace Cullens, PharmD, [place] [phone]

## 2021-12-11 ENCOUNTER — Other Ambulatory Visit: Payer: Self-pay | Admitting: Family Medicine

## 2021-12-11 DIAGNOSIS — F331 Major depressive disorder, recurrent, moderate: Secondary | ICD-10-CM

## 2021-12-11 NOTE — Telephone Encounter (Signed)
Requested Prescriptions  Pending Prescriptions Disp Refills   buPROPion (WELLBUTRIN XL) 150 MG 24 hr tablet [Pharmacy Med Name: BUPROPION HCL ER (XL) 150 MG TAB] 90 tablet 1    Sig: TAKE 1 TABLET BY MOUTH ONCE DAILY     Psychiatry: Antidepressants - bupropion Failed - 12/11/2021 10:47 AM      Failed - Last BP in normal range    BP Readings from Last 1 Encounters:  10/25/21 (!) 154/80         Passed - Cr in normal range and within 360 days    Creat  Date Value Ref Range Status  02/22/2021 0.88 0.70 - 1.28 mg/dL Final   Creatinine, POC  Date Value Ref Range Status  03/09/2015 0 mg/dL Final         Passed - AST in normal range and within 360 days    AST  Date Value Ref Range Status  02/22/2021 16 10 - 35 U/L Final   SGOT(AST)  Date Value Ref Range Status  09/19/2011 30 15 - 37 Unit/L Final         Passed - ALT in normal range and within 360 days    ALT  Date Value Ref Range Status  02/22/2021 22 9 - 46 U/L Final   SGPT (ALT)  Date Value Ref Range Status  09/19/2011 53 12 - 78 U/L Final         Passed - Completed PHQ-2 or PHQ-9 in the last 360 days      Passed - Valid encounter within last 6 months    Recent Outpatient Visits           3 months ago Acute non-recurrent frontal sinusitis   [place], DO   4 months ago Neck pain on right side   [facility] [place], [place] W, NP   4 months ago Major depressive disorder, recurrent, moderate ([place])   [place], DO   5 months ago Major depressive disorder, recurrent, moderate ([place])   [facility] [name], DO   9 months ago Annual physical exam   [place], [name], DO

## 2021-12-12 ENCOUNTER — Other Ambulatory Visit: Payer: Self-pay | Admitting: Family Medicine

## 2021-12-12 ENCOUNTER — Ambulatory Visit: Payer: Medicare HMO | Admitting: Pharmacist

## 2021-12-12 DIAGNOSIS — I10 Essential (primary) hypertension: Secondary | ICD-10-CM

## 2021-12-12 DIAGNOSIS — E1169 Type 2 diabetes mellitus with other specified complication: Secondary | ICD-10-CM

## 2021-12-12 DIAGNOSIS — F331 Major depressive disorder, recurrent, moderate: Secondary | ICD-10-CM

## 2021-12-12 MED ORDER — BUPROPION HCL ER (XL) 300 MG PO TB24: 300.0000 mg | ORAL_TABLET | Freq: Every day | ORAL | 1 refills | Status: DC

## 2021-12-12 NOTE — Patient Instructions (Signed)
Goals Addressed             This Visit's Progress    Pharmacy Goals       Our goal A1c is less than 7%. This corresponds with fasting sugars less than 130 and 2 hour after meal sugars less than 180. Please check your blood sugar and keep log of the results  Our goal bad cholesterol, or LDL, is less than 70 . This is why it is important to continue taking your atorvastatin   Please check your home blood pressure, keep a log of the results and have this to review during our next appointment.  Feel free to call me with any questions or concerns. I look forward to our next call!   Wallace Cullens, PharmD, [place] [phone]

## 2021-12-12 NOTE — Telephone Encounter (Signed)
Can you clarify which pharmacy he needs Trulicity sent to?  It is listed as a [employer] in [place]?  [address] [phone]   Do they need me to send it there or somewhere else?  [name], DO [facility] 12/12/2021, 12:02 PM

## 2021-12-12 NOTE — Chronic Care Management (AMB) (Signed)
12/12/2021 Name: Kevin Perry MRN: [id number] DOB: [birth year]  Chief Complaint  Patient presents with   Medication Assistance   Medication Management    Kevin Perry is a 71 y.o. year old male who presented for a telephone visit.   They were referred to the pharmacist by their PCP for assistance in managing diabetes, hypertension, hyperlipidemia, and medication access.   Subjective:  Care Team: Primary Care Provider: [name], DO Cardiologist: Flossie Dibble, MD  Medication Access/Adherence  Current Pharmacy:  [facility], [place] [address] Phone: [phone] Fax: [phone]  [facility] [address] [place] - [place] [address] Phone: [phone] Fax: [phone]  [facility], [place] to Registered [address] [place] [address] Phone: [phone] Fax: [place], [place] [place] [address] Phone: [phone] Fax: [phone]   Patient reports affordability concerns with their medications: No  Patient reports access/transportation concerns to their pharmacy: No  Patient reports adherence concerns with their medications:  No     Diabetes:  Current medications:  Metformin IR '500mg'$  x 2 in AM / x 1 in PM Trulicity 3 mg weekly Medications tried in the past: glipizide     Current glucose readings: this morning: 117  Patient denies hypoglycemic s/sx including dizziness, shakiness, sweating.  Current physical activity: walking 30-60 minutes x 6 days/week  Statin: atorvastatin 40 mg daily  Current medication access support: APPROVED for Trulicity from [facility] Patient Assistance Program through 02/03/22  Hypertension:  Current medications:   Amlodipine '10mg'$  daily Furosemide 40 mg daily Lisinopril 20 mg twice daily Metoprolol tartrate 25 mg twice daily  From review of chart, note at office visit with [facility] on 10/26, BP 120/70, HR 64  Patient has an automated, upper arm home BP cuff  Patient denies hypotensive s/sx including dizziness, lightheadedness.   Current physical activity: walking 30-60 minutes x 6 days/week   Objective: Lab Results  Component Value Date   HGBA1C 7.3 (H) 02/22/2021    Lab Results  Component Value Date   CREATININE 0.88 02/22/2021   BUN 13 02/22/2021   NA 137 02/22/2021   K 3.8 02/22/2021   CL 100 02/22/2021   CO2 31 02/22/2021    Lab Results  Component Value Date   CHOL 122 02/22/2021   HDL 38 (L) 02/22/2021   LDLCALC 51 02/22/2021   TRIG 315 (H) 02/22/2021   CHOLHDL 3.2 02/22/2021   BP Readings from Last 3 Encounters:  10/25/21 (!) 154/80  07/26/21 128/74  07/18/21 140/80   Pulse Readings from Last 3 Encounters:  10/25/21 64  07/26/21 61  07/18/21 61     Medications Reviewed Today     Reviewed by [name], RPH-CPP (Pharmacist) on 12/12/21 at 1530  Med List Status: <None>   Medication Order Taking? Sig Documenting Provider Last Dose Status Informant  acetaminophen (TYLENOL) 325 MG tablet [id number] Yes Take by mouth. [provider] Taking Active   amLODipine (NORVASC) 10 MG tablet [id number] Yes Take 1 tablet (10 mg total) by mouth daily. [name], DO Taking Active   aspirin EC 81 MG tablet [id number] Yes Take 81 mg by mouth daily. Swallow whole. [provider]  Active  atorvastatin (LIPITOR) 40 MG tablet [id number] Yes Take 1 tablet (40 mg total) by mouth daily. [name], DO Taking Active   buPROPion (WELLBUTRIN XL) 300 MG 24 hr tablet [id number]  Take 1 tablet (300 mg total) by mouth daily. [name], DO  Active   folic acid (FOLVITE) 1 MG tablet [id number] Yes Take 1 mg by  mouth See admin instructions. Take 1 mg daily except skip dose on Wednesdays (methotrexate day) [provider] Taking Active Self  furosemide (LASIX) 40 MG tablet [id number] Yes TAKE 1 TABLET BY MOUTH ONCE DAILY AS NEEDED  Patient taking differently: Take 40 mg by mouth daily.   [name], DO Taking Active   gabapentin (NEURONTIN) 100 MG capsule [id number] Yes Take 1 capsule (100 mg total) by mouth 2 (two) times daily. [name], DO Taking Active   lisinopril (ZESTRIL) 20 MG tablet [id number] Yes Take 1 tablet by mouth daily. [provider] Taking Active   loratadine (CLARITIN) 10 MG tablet [id number] Yes Take 10 mg by mouth every morning. [provider] Taking Active Self  metFORMIN (GLUCOPHAGE) 500 MG tablet [id number] Yes Take 2 in morning with breakfast and take 1 tab in evening with dinner [name], DO Taking Active   metoprolol tartrate (LOPRESSOR) 25 MG tablet [id number] Yes TAKE 1 TABLET BY MOUTH TWICE DAILY [place], [name], DO Taking Active   nitroGLYCERIN (NITROSTAT) 0.4 MG SL tablet [id number]  Place under the tongue. [provider]  Active   tamsulosin (FLOMAX) 0.4 MG CAPS capsule [id number] Yes Take 1 capsule (0.4 mg total) by mouth daily. [name], DO Taking Active   TRULICITY 3 XI/3.3AS [name] [id number] Yes INJECT 3 MG (0.5 ML) UNDER THE SKIN ONCE A WEEK Karamalegos, [name], DO Taking Active               Assessment/Plan:   Comprehensive medication review performed; medication list updated in electronic medical record - Reports he has almost used up his supply of bupropion ER 150 mg Rx by doubling up and requesting now Rx for the bupropion ER 300 mg strength  Will send message to PCP  Diabetes: - Recommend to continue to check glucose and keep a log of the results - Meets financial criteria for Trulicity patient assistance program through [place]. Will collaborate with  provider, CPhT, and patient to pursue assistance program renewal for 2024.    Hypertension: - Currently controlled - Have reviewed appropriate blood pressure monitoring technique and reviewed goal blood pressure.  - Recommend patient to continue to monitor home BP, keep log of results including HR readings and bring this record to medical appointments    Follow Up Plan: Clinical Pharmacist to follow up with patient by telephone on 02/13/2022 at 3 pm  Wallace Cullens, PharmD, [place], [place] [phone]

## 2021-12-12 NOTE — Telephone Encounter (Signed)
Requested medication (s) are due for refill today: yes  Requested medication (s) are on the active medication list: yes  Last refill:  03/28/21  Future visit scheduled: yes  Notes to clinic:  Unable to refill per protocol, last refill by another provider.      Requested Prescriptions  Pending Prescriptions Disp Refills   TRULICITY 3 ZO/1.0RU SOPN [Pharmacy Med Name: Trulicity Subcutaneous Solution Pen-injector 3 MG/0.5ML] 8 mL 0    Sig: INJECT 3 MG (0.5 ML) UNDER THE SKIN ONCE A WEEK     Endocrinology:  Diabetes - GLP-1 Receptor Agonists Failed - 12/12/2021  4:55 AM      Failed - HBA1C is between 0 and 7.9 and within 180 days    Hemoglobin A1C  Date Value Ref Range Status  07/27/2012 7.6 (H) 4.2 - 6.3 % Final    Comment:    The American Diabetes Association recommends that a primary goal of therapy should be <7% and that physicians should reevaluate the treatment regimen in patients with HbA1c values consistently >8%.    Hgb A1c MFr Bld  Date Value Ref Range Status  02/22/2021 7.3 (H) <5.7 % of total Hgb Final    Comment:    For someone without known diabetes, a hemoglobin A1c value of 6.5% or greater indicates that they may have  diabetes and this should be confirmed with a follow-up  test. . For someone with known diabetes, a value <7% indicates  that their diabetes is well controlled and a value  greater than or equal to 7% indicates suboptimal  control. A1c targets should be individualized based on  duration of diabetes, age, comorbid conditions, and  other considerations. . Currently, no consensus exists regarding use of hemoglobin A1c for diagnosis of diabetes for children. Renella Cunas - Valid encounter within last 6 months    Recent Outpatient Visits           3 months ago Acute non-recurrent frontal sinusitis   [place], DO   4 months ago Neck pain on right side   [facility] [place],  [place] W, NP   4 months ago Major depressive disorder, recurrent, moderate [facility])   [place], DO   5 months ago Major depressive disorder, recurrent, moderate ([place])   [facility] [name], DO   9 months ago Annual physical exam   [place], [name], DO

## 2021-12-14 ENCOUNTER — Telehealth: Payer: Self-pay | Admitting: Pharmacy Technician

## 2021-12-14 DIAGNOSIS — Z596 Low income: Secondary | ICD-10-CM

## 2021-12-14 NOTE — Progress Notes (Signed)
[place] [facility] [facility])                                            [place] Team    12/14/2021  [place] 1951-01-24 [id number]                                      Medication Assistance Referral-FOR 2024 RE ENROLLMENT  Referral From: [facility] Embedded RPh Dorthula Perfect   Medication/Company: Danelle Berry / Ralph Leyden Patient application portion:  Mailed Provider application portion: Faxed  to Dr. [name] Provider address/fax verified via: Office website  Kathrynne Kulinski P. Kayce Chismar, [place]  [phone]

## 2021-12-17 DIAGNOSIS — D2239 Melanocytic nevi of other parts of face: Secondary | ICD-10-CM | POA: Diagnosis not present

## 2021-12-17 DIAGNOSIS — D485 Neoplasm of uncertain behavior of skin: Secondary | ICD-10-CM | POA: Diagnosis not present

## 2021-12-17 DIAGNOSIS — L298 Other pruritus: Secondary | ICD-10-CM | POA: Diagnosis not present

## 2021-12-17 DIAGNOSIS — C44311 Basal cell carcinoma of skin of nose: Secondary | ICD-10-CM | POA: Diagnosis not present

## 2021-12-17 DIAGNOSIS — Z08 Encounter for follow-up examination after completed treatment for malignant neoplasm: Secondary | ICD-10-CM | POA: Diagnosis not present

## 2021-12-17 DIAGNOSIS — Z85828 Personal history of other malignant neoplasm of skin: Secondary | ICD-10-CM | POA: Diagnosis not present

## 2021-12-17 DIAGNOSIS — L209 Atopic dermatitis, unspecified: Secondary | ICD-10-CM | POA: Diagnosis not present

## 2021-12-17 DIAGNOSIS — L2089 Other atopic dermatitis: Secondary | ICD-10-CM | POA: Diagnosis not present

## 2021-12-17 DIAGNOSIS — Z79899 Other long term (current) drug therapy: Secondary | ICD-10-CM | POA: Diagnosis not present

## 2021-12-17 DIAGNOSIS — R58 Hemorrhage, not elsewhere classified: Secondary | ICD-10-CM | POA: Diagnosis not present

## 2022-01-01 ENCOUNTER — Other Ambulatory Visit: Payer: Self-pay | Admitting: Family Medicine

## 2022-01-01 DIAGNOSIS — D225 Melanocytic nevi of trunk: Secondary | ICD-10-CM | POA: Diagnosis not present

## 2022-01-01 DIAGNOSIS — N401 Enlarged prostate with lower urinary tract symptoms: Secondary | ICD-10-CM

## 2022-01-01 DIAGNOSIS — L814 Other melanin hyperpigmentation: Secondary | ICD-10-CM | POA: Diagnosis not present

## 2022-01-01 DIAGNOSIS — R208 Other disturbances of skin sensation: Secondary | ICD-10-CM | POA: Diagnosis not present

## 2022-01-01 DIAGNOSIS — L738 Other specified follicular disorders: Secondary | ICD-10-CM | POA: Diagnosis not present

## 2022-01-01 DIAGNOSIS — L821 Other seborrheic keratosis: Secondary | ICD-10-CM | POA: Diagnosis not present

## 2022-01-01 DIAGNOSIS — I872 Venous insufficiency (chronic) (peripheral): Secondary | ICD-10-CM | POA: Diagnosis not present

## 2022-01-01 DIAGNOSIS — D485 Neoplasm of uncertain behavior of skin: Secondary | ICD-10-CM | POA: Diagnosis not present

## 2022-01-01 DIAGNOSIS — L2089 Other atopic dermatitis: Secondary | ICD-10-CM | POA: Diagnosis not present

## 2022-01-01 DIAGNOSIS — L089 Local infection of the skin and subcutaneous tissue, unspecified: Secondary | ICD-10-CM | POA: Diagnosis not present

## 2022-01-01 DIAGNOSIS — L298 Other pruritus: Secondary | ICD-10-CM | POA: Diagnosis not present

## 2022-01-01 NOTE — Telephone Encounter (Signed)
Refilled 03/08/2021 #90 3 rf Requested Prescriptions  Pending Prescriptions Disp Refills   tamsulosin (FLOMAX) 0.4 MG CAPS capsule [Pharmacy Med Name: TAMSULOSIN HCL 0.4 MG CAP] 90 capsule 3    Sig: TAKE 1 CAPSULE BY MOUTH ONCE DAILY 30 MINUTES AFTER LARGEST MEAL.     Urology: Alpha-Adrenergic Blocker Failed - 01/01/2022  9:02 AM      Failed - Last BP in normal range    BP Readings from Last 1 Encounters:  10/25/21 (!) 154/80         Passed - PSA in normal range and within 360 days    PSA  Date Value Ref Range Status  02/22/2021 0.32 < OR = 4.00 ng/mL Final    Comment:    The total PSA value from this assay system is  standardized against the WHO standard. The test  result will be approximately 20% lower when compared  to the equimolar-standardized total PSA (Beckman  Coulter). Comparison of serial PSA results should be  interpreted with this fact in mind. . This test was performed using the Siemens  chemiluminescent method. Values obtained from  different assay methods cannot be used interchangeably. PSA levels, regardless of value, should not be interpreted as absolute evidence of the presence or absence of disease.          Passed - Valid encounter within last 12 months    Recent Outpatient Visits           2 weeks ago Type 2 diabetes mellitus with other specified complication, without long-term current use of insulin ([place])   [facility], [place] A, RPH-CPP   4 months ago Acute non-recurrent frontal sinusitis   [place], DO   5 months ago Neck pain on right side   [facility] [place], [place] W, NP   5 months ago Major depressive disorder, recurrent, moderate ([place])   [place], DO   6 months ago Major depressive disorder, recurrent, moderate ([place])   [facility] [place], [name], DO

## 2022-01-13 ENCOUNTER — Telehealth: Payer: Self-pay | Admitting: Pharmacy Technician

## 2022-01-13 DIAGNOSIS — Z596 Low income: Secondary | ICD-10-CM

## 2022-01-13 NOTE — Progress Notes (Signed)
[place] [facility])                                            [place] Team    01/13/2022  [place] [birth year] [id number]  Received both patient and provider portion(s) of patient assistance application(s) for Trulicity. Faxed completed application and required documents into [facility].    Shalah Estelle P. Elior Robinette, [place]  [phone]

## 2022-01-18 DIAGNOSIS — H26493 Other secondary cataract, bilateral: Secondary | ICD-10-CM | POA: Diagnosis not present

## 2022-01-18 DIAGNOSIS — E113312 Type 2 diabetes mellitus with moderate nonproliferative diabetic retinopathy with macular edema, left eye: Secondary | ICD-10-CM | POA: Diagnosis not present

## 2022-01-22 ENCOUNTER — Telehealth: Payer: Self-pay | Admitting: Family Medicine

## 2022-01-22 NOTE — Telephone Encounter (Signed)
Left message for patient to call back and schedule Medicare Annual Wellness Visit (AWV) to be done virtually or by telephone.  No hx of AWV eligible as of 05/06/19  Please schedule at anytime with [facility].      66 Minutes appointment   Any questions, please call me at [phone]

## 2022-01-25 ENCOUNTER — Encounter: Payer: Self-pay | Admitting: Family Medicine

## 2022-01-25 DIAGNOSIS — F331 Major depressive disorder, recurrent, moderate: Secondary | ICD-10-CM

## 2022-01-30 MED ORDER — ESCITALOPRAM OXALATE 20 MG PO TABS: 20.0000 mg | ORAL_TABLET | Freq: Every day | ORAL | 1 refills | Status: DC

## 2022-02-05 ENCOUNTER — Other Ambulatory Visit: Payer: Self-pay | Admitting: Family Medicine

## 2022-02-05 DIAGNOSIS — I10 Essential (primary) hypertension: Secondary | ICD-10-CM

## 2022-02-06 NOTE — Telephone Encounter (Signed)
Rx was sent to pharmacy 11/13/21 #180/1.  Requested Prescriptions  Pending Prescriptions Disp Refills   metoprolol tartrate (LOPRESSOR) 25 MG tablet [Pharmacy Med Name: METOPROLOL TARTRATE 25 MG TAB] 180 tablet 1    Sig: TAKE 1 TABLET BY MOUTH TWICE DAILY     Cardiovascular:  Beta Blockers Failed - 02/05/2022 10:04 AM      Failed - Last BP in normal range    BP Readings from Last 1 Encounters:  10/25/21 (!) 154/80         Failed - Valid encounter within last 6 months    Recent Outpatient Visits           1 month ago Type 2 diabetes mellitus with other specified complication, without long-term current use of insulin ([place])   [facility], Grayland Ormond A, RPH-CPP   5 months ago Acute non-recurrent frontal sinusitis   [facility] [place], [name], DO   6 months ago Neck pain on right side   [facility] [place], [place] W, NP   6 months ago Major depressive disorder, recurrent, moderate ([place])   [facility], [name], DO   7 months ago Major depressive disorder, recurrent, moderate ([place])   [facility], [name], DO              Passed - Last Heart Rate in normal range    Pulse Readings from Last 1 Encounters:  10/25/21 64          atorvastatin (LIPITOR) 40 MG tablet [Pharmacy Med Name: ATORVASTATIN CALCIUM 40 MG TAB] 90 tablet 3    Sig: TAKE 1 TABLET BY MOUTH ONCE EVERY EVENING     Cardiovascular:  Antilipid - Statins Failed - 02/05/2022 10:04 AM      Failed - Lipid Panel in normal range within the last 12 months    Cholesterol  Date Value Ref Range Status  02/22/2021 122 <200 mg/dL Final  07/27/2012 153 0 - 200 mg/dL Final   Ldl Cholesterol, Calc  Date Value Ref Range Status  07/27/2012 77 0 - 100 mg/dL Final   LDL Cholesterol (Calc)  Date Value Ref Range Status  02/22/2021 51 mg/dL (calc) Final    Comment:    Reference range: <100 . Desirable range  <100 mg/dL for primary prevention;   <70 mg/dL for patients with CHD or diabetic patients  with > or = 2 CHD risk factors. Marland Kitchen LDL-C is now calculated using the Martin-Hopkins  calculation, which is a validated novel method providing  better accuracy than the Friedewald equation in the  estimation of LDL-C.  Cresenciano Genre et al. Annamaria Helling. 8850;277(41): 2061-2068  (http://education.Quest[employer].com/faq/FAQ164)    HDL Cholesterol  Date Value Ref Range Status  07/27/2012 34 (L) 40 - 60 mg/dL Final   HDL  Date Value Ref Range Status  02/22/2021 38 (L) > OR = 40 mg/dL Final   Triglycerides  Date Value Ref Range Status  02/22/2021 315 (H) <150 mg/dL Final    Comment:    . If a non-fasting specimen was collected, consider repeat triglyceride testing on a fasting specimen if clinically indicated.  Yates Decamp et al. J. of Clin. Lipidol. 2878;6:767-209. .   07/27/2012 212 (H) 0 - 200 mg/dL Final         Passed - Patient is not pregnant      Passed - Valid encounter within last 12 months    Recent Outpatient Visits  1 month ago Type 2 diabetes mellitus with other specified complication, without long-term current use of insulin ([place])   [facility] Grayland Ormond A, RPH-CPP   5 months ago Acute non-recurrent frontal sinusitis   [place], DO   6 months ago Neck pain on right side   [facility] [place], [place] W, NP   6 months ago Major depressive disorder, recurrent, moderate [facility])   [place], DO   7 months ago Major depressive disorder, recurrent, moderate ([place])   [facility] [place], [name], DO

## 2022-02-06 NOTE — Telephone Encounter (Signed)
Patient called, left VM to return the call to the office to scheduled an appt for medication refill request.   

## 2022-02-08 ENCOUNTER — Ambulatory Visit (INDEPENDENT_AMBULATORY_CARE_PROVIDER_SITE_OTHER): Payer: Medicare HMO | Admitting: Family Medicine

## 2022-02-08 ENCOUNTER — Encounter: Payer: Self-pay | Admitting: Family Medicine

## 2022-02-08 VITALS — BP 142/88 | HR 70 | Wt 254.4 lb

## 2022-02-08 DIAGNOSIS — R5383 Other fatigue: Secondary | ICD-10-CM

## 2022-02-08 DIAGNOSIS — R058 Other specified cough: Secondary | ICD-10-CM | POA: Diagnosis not present

## 2022-02-08 DIAGNOSIS — R051 Acute cough: Secondary | ICD-10-CM

## 2022-02-08 DIAGNOSIS — J9801 Acute bronchospasm: Secondary | ICD-10-CM | POA: Diagnosis not present

## 2022-02-08 DIAGNOSIS — J40 Bronchitis, not specified as acute or chronic: Secondary | ICD-10-CM

## 2022-02-08 DIAGNOSIS — R0981 Nasal congestion: Secondary | ICD-10-CM | POA: Diagnosis not present

## 2022-02-08 LAB — POC INFLUENZA A&B (BINAX/QUICKVUE)
Influenza A, POC: NEGATIVE
Influenza B, POC: NEGATIVE

## 2022-02-08 MED ORDER — AZITHROMYCIN 250 MG PO TABS: ORAL_TABLET | ORAL | 0 refills | Status: DC

## 2022-02-08 MED ORDER — PREDNISONE 20 MG PO TABS: ORAL_TABLET | ORAL | 0 refills | Status: DC

## 2022-02-08 NOTE — Patient Instructions (Addendum)
Thank you for coming to the office today.  Likely viral syndrome initially now post viral issue with lingering fatigue and symptoms, concern for lower respiratory infection.  Flu test negative today  Start Azithromycin Z pak (antibiotic) 2 tabs day 1, then 1 tab x 4 days, complete entire course even if improved  Start Prednisone taper  Keep on Albuterol inhaler, cough syrup, may use mucinex, continue Flonase  Return to [facility] for Chest X-ray if not improved early next week  Please schedule a Follow-up Appointment to: Return if symptoms worsen or fail to improve.  If you have any other questions or concerns, please feel free to call the office or send a message through [place]. You may also schedule an earlier appointment if necessary.  Additionally, you may be receiving a survey about your experience at our office within a few days to 1 week by e-mail or mail. We value your feedback.  [name], DO [place]

## 2022-02-08 NOTE — Progress Notes (Signed)
Subjective:    Patient ID: Kevin Perry, male    DOB: [birth year], 72 y.o.   MRN: [id number]  Kevin Perry is a 73 y.o. male presenting on 02/08/2022 for Cough  Patient presents for a same day appointment.   HPI  Acute Viral Syndrome Cough Bronchospasm Reports about 7-10 days ago onset with cough and body ache and sinus congestion and dry cough. He had sick family member. They did testing and negative for COVID. He was asking about Flu Testing. - Taking Cough medicine as needed and Ibuprofen. No relief from Tylenol - Using Flonase and Virtussein codeine cough syrup - Has ALbuterol inhaler AS NEEDED      07/18/2021    8:53 AM 06/20/2021    9:01 AM 03/08/2021    9:46 AM  Depression screen PHQ 2/9  Decreased Interest 3 3 0  Down, Depressed, Hopeless 2 3 0  PHQ - 2 Score 5 6 0  Altered sleeping 2 2 0  Tired, decreased energy 2 2 0  Change in appetite 0 1 0  Feeling bad or failure about yourself  0 1 0  Trouble concentrating 1 1 0  Moving slowly or fidgety/restless 0 0 0  Suicidal thoughts 0 0 0  PHQ-9 Score 10 13 0  Difficult doing work/chores Somewhat difficult Somewhat difficult Not difficult at all    Social History   Tobacco Use   Smoking status: Former    Types: Cigarettes    Quit date: 08/16/2004    Years since quitting: 17.4   Smokeless tobacco: Former   Tobacco comments:    quit 20 plus years  Vaping Use   Vaping Use: Never used  Substance Use Topics   Alcohol use: Not Currently    Alcohol/week: 2.0 standard drinks of alcohol    Types: 2 Cans of beer per week    Comment: SOCIAL   Drug use: No    Review of Systems Per HPI unless specifically indicated above     Objective:    BP (!) 142/88   Pulse 70   Wt 254 lb 6.4 oz (115.4 kg)   SpO2 99%   BMI 33.56 kg/m   Wt Readings from Last 3 Encounters:  02/08/22 254 lb 6.4 oz (115.4 kg)  10/25/21 266 lb (120.7 kg)  08/27/21 262 lb (118.8 kg)    Physical Exam Vitals and nursing note reviewed.   Constitutional:      General: He is not in acute distress.    Appearance: He is well-developed. He is not diaphoretic.     Comments: Well-appearing, comfortable, cooperative  HENT:     Head: Normocephalic and atraumatic.  Eyes:     General:        Right eye: No discharge.        Left eye: No discharge.     Conjunctiva/sclera: Conjunctivae normal.  Neck:     Thyroid: No thyromegaly.  Cardiovascular:     Rate and Rhythm: Normal rate and regular rhythm.     Pulses: Normal pulses.     Heart sounds: Normal heart sounds. No murmur heard. Pulmonary:     Effort: Pulmonary effort is normal. No respiratory distress.     Breath sounds: Normal breath sounds. No wheezing or rales.     Comments: Cough Musculoskeletal:        General: Normal range of motion.     Cervical back: Normal range of motion and neck supple.  Lymphadenopathy:     Cervical: No cervical  adenopathy.  Skin:    General: Skin is warm and dry.     Findings: No erythema or rash.  Neurological:     Mental Status: He is alert and oriented to person, place, and time. Mental status is at baseline.  Psychiatric:        Behavior: Behavior normal.     Comments: Well groomed, good eye contact, normal speech and thoughts       Results for orders placed or performed in visit on 02/08/22  POC Influenza A&B (Binax test)  Result Value Ref Range   Influenza A, POC Negative Negative   Influenza B, POC Negative Negative      Assessment & Plan:   Problem List Items Addressed This Visit   None Visit Diagnoses     Acute cough    -  Primary   Relevant Medications   predniSONE (DELTASONE) 20 MG tablet   azithromycin (ZITHROMAX Z-PAK) 250 MG tablet   Other Relevant Orders   POC Influenza A&B (Binax test) (Completed)   DG Chest 2 View   Bronchitis       Relevant Medications   predniSONE (DELTASONE) 20 MG tablet   azithromycin (ZITHROMAX Z-PAK) 250 MG tablet   Other Relevant Orders   DG Chest 2 View   Post-viral cough  syndrome       Relevant Orders   DG Chest 2 View   Bronchospasm, acute       Relevant Medications   predniSONE (DELTASONE) 20 MG tablet       Likely viral syndrome initially now post viral issue with lingering fatigue and symptoms, concern for lower respiratory infection.  Flu test negative today. Close contact also sick had negative COVID test this week.  Start Azithromycin Z pak (antibiotic) 2 tabs day 1, then 1 tab x 4 days, complete entire course even if improved  Start Prednisone taper  Keep on Albuterol inhaler, cough syrup, may use mucinex, continue Flonase  Return to [facility] for [facility] if not improved early next week  Meds ordered this encounter  Medications   predniSONE (DELTASONE) 20 MG tablet    Sig: Take daily with food. Start with '60mg'$  (3 pills) x 2 days, then reduce to '40mg'$  (2 pills) x 2 days, then '20mg'$  (1 pill) x 3 days    Dispense:  13 tablet    Refill:  0   azithromycin (ZITHROMAX Z-PAK) 250 MG tablet    Sig: Take 2 tabs ('500mg'$  total) on Day 1. Take 1 tab ('250mg'$ ) daily for next 4 days.    Dispense:  6 tablet    Refill:  0      Follow up plan: Return if symptoms worsen or fail to improve.   [name], [facility] 02/08/2022, 10:39 AM

## 2022-02-11 ENCOUNTER — Telehealth: Payer: Self-pay | Admitting: Pharmacy Technician

## 2022-02-11 DIAGNOSIS — Z596 Low income: Secondary | ICD-10-CM

## 2022-02-11 NOTE — Progress Notes (Signed)
[facility])                                            [place] Team    02/11/2022  [place] 06-Mar-1950 [id number]  Care coordination call placed to [place] in regard to [place].  Spoke to [place] who informs patient is APPROVED 02/04/22-02/04/23. He informs the medication will auto refill and ship to patient's home.  Rexanna Louthan P. Maurie Musco, [place]  [phone]

## 2022-02-13 ENCOUNTER — Telehealth: Payer: Self-pay | Admitting: Pharmacist

## 2022-02-13 ENCOUNTER — Telehealth: Payer: Medicare HMO

## 2022-02-13 NOTE — Telephone Encounter (Signed)
   Outreach Note  02/13/2022 Name: Kevin Perry MRN: [id number] DOB: 1950/04/11  Referred by: [name], DO Reason for referral : No chief complaint on file.   Was unable to reach patient via telephone today and have left HIPAA compliant voicemail asking patient to return my call.    Follow Up Plan: Will attempt to reach patient by telephone again within the next 30 days  Wallace Cullens, PharmD, [facility] [place] [phone]

## 2022-03-04 ENCOUNTER — Encounter: Payer: Self-pay | Admitting: Family Medicine

## 2022-03-04 DIAGNOSIS — R1013 Epigastric pain: Secondary | ICD-10-CM

## 2022-03-04 DIAGNOSIS — R110 Nausea: Secondary | ICD-10-CM

## 2022-03-06 NOTE — Addendum Note (Signed)
Addended by: [name] on: 03/06/2022 01:25 PM   Modules accepted: Orders

## 2022-03-08 ENCOUNTER — Ambulatory Visit: Payer: Medicare HMO | Admitting: Pharmacist

## 2022-03-08 DIAGNOSIS — I10 Essential (primary) hypertension: Secondary | ICD-10-CM

## 2022-03-08 DIAGNOSIS — E1169 Type 2 diabetes mellitus with other specified complication: Secondary | ICD-10-CM

## 2022-03-08 NOTE — Patient Instructions (Signed)
Goals Addressed             This Visit's Progress    Pharmacy Goals       Our goal A1c is less than 7%. This corresponds with fasting sugars less than 130 and 2 hour after meal sugars less than 180. Please check your blood sugar and keep log of the results  Our goal bad cholesterol, or LDL, is less than 70 . This is why it is important to continue taking your atorvastatin   Please check your home blood pressure, keep a log of the results and have this to review during our next appointment.  Feel free to call me with any questions or concerns. I look forward to our next call!  Wallace Cullens, PharmD, [place] [phone]

## 2022-03-08 NOTE — Progress Notes (Signed)
03/08/2022 Name: Kevin Perry MRN: [id number] DOB: [birth year]  Chief Complaint  Patient presents with   Medication Management   Medication Assistance    Kevin Perry is a 72 y.o. year old male who presented for a telephone visit.   They were referred to the pharmacist by their PCP for assistance in managing diabetes, hypertension, hyperlipidemia, and medication access.    Subjective:  Care Team: Primary Care Provider: [name], DO Cardiologist: Flossie Dibble, MD Gastroenterology: Alice Reichert, Malena Catholic, MD; Initial Visit Scheduled for: 04/04/2022  Medication Access/Adherence  Current Pharmacy:  [place], [place] [address] Phone: [phone] Fax: [place] [address], [place] [address] Phone: [phone] Fax: [phone]  [facility] [place] [place] [address] [place] [address] Phone: [phone] Fax: [place], [place] [address] [place] [address] Phone: [phone] Fax: [phone]   Patient reports affordability concerns with their medications: No  Patient reports access/transportation concerns to their pharmacy: No  Patient reports adherence concerns with their medications:  No    From review of chart, note patient contacted PCP office regarding referral to GI specialist related to stomach symptoms (scheduled for 04/04/2022) - Patient denies timing of symptoms correlating with start or dose changes of Trulicity - Still today review with patient strategies for managing side effects when related to Trulicity, including having smaller meals spread throughout the day, avoiding fatty foods, avoiding eating when full and eating  more bland foods  Diabetes:   Current medications:  Metformin IR '500mg'$  x 2 in AM / x 1 in PM Trulicity 3 mg weekly Medications tried in the past: glipizide     Current glucose readings ranging 120-132   Patient denies hypoglycemic s/sx including dizziness, shakiness, sweating.  Current physical activity: walking 30-60 minutes x 6 days/week   Statin: atorvastatin 40 mg daily   Current medication access support: APPROVED for Trulicity from [facility] Patient Assistance Program through 02/04/23 - Reports received shipment of Trulicity in January   Hypertension:   Current medications:  Amlodipine '10mg'$  daily Furosemide 40 mg daily Lisinopril 20 mg twice daily Metoprolol tartrate 25 mg twice daily   Patient has an automated, upper arm home BP cuff  Recalls recent home blood pressure readings ranging: 120-125/75-80   Patient denies hypotensive s/sx including dizziness, lightheadedness  Denies symptoms of swelling   Current physical activity: walking 30-60 minutes x 6 days/week     Objective:  Lab Results  Component Value Date   HGBA1C 7.3 (H) 02/22/2021    Lab Results  Component Value Date   CREATININE 0.88 02/22/2021   BUN 13 02/22/2021   NA 137 02/22/2021   K 3.8 02/22/2021   CL 100 02/22/2021   CO2 31 02/22/2021    Lab Results  Component Value Date   CHOL 122 02/22/2021   HDL 38 (L) 02/22/2021   LDLCALC 51 02/22/2021   TRIG 315 (H) 02/22/2021   CHOLHDL 3.2 02/22/2021   BP Readings from Last 3 Encounters:  02/08/22 (!) 142/88  10/25/21 (!) 154/80  07/26/21 128/74   Pulse Readings from Last 3 Encounters:  02/08/22 70  10/25/21 64  07/26/21 61     Medications Reviewed Today  Reviewed by [name], RPH-CPP (Pharmacist) on 03/08/22 at 1406  Med List Status: <None>   Medication Order Taking? Sig Documenting Provider Last Dose Status Informant  acetaminophen (TYLENOL) 325 MG tablet [id number]  Take by mouth. [provider]  Active    amLODipine (NORVASC) 10 MG tablet [id number] Yes Take 1 tablet (10 mg total) by mouth daily. [name], DO Taking Active   aspirin EC 81 MG tablet [id number]  Take 81 mg by mouth daily. Swallow whole. [provider]  Active   atorvastatin (LIPITOR) 40 MG tablet [id number] Yes TAKE 1 TABLET BY MOUTH ONCE EVERY EVENING Karamalegos, Alexander Lenna Sciara, DO Taking Active   buPROPion (WELLBUTRIN XL) 300 MG 24 hr tablet [id number]  Take 1 tablet (300 mg total) by mouth daily. [name], DO  Active   escitalopram (LEXAPRO) 20 MG tablet [id number]  Take 1 tablet (20 mg total) by mouth daily. Karamalegos, [name], DO  Active   folic acid (FOLVITE) 1 MG tablet [id number]  Take 1 mg by mouth See admin instructions. Take 1 mg daily except skip dose on Wednesdays (methotrexate day) [provider]  Active Self  furosemide (LASIX) 40 MG tablet [id number] Yes TAKE 1 TABLET BY MOUTH ONCE DAILY AS NEEDED  Patient taking differently: Take 40 mg by mouth daily.   [name], DO Taking Active   gabapentin (NEURONTIN) 100 MG capsule [id number]  Take 1 capsule (100 mg total) by mouth 2 (two) times daily. Karamalegos, [name], DO  Active   lisinopril (ZESTRIL) 20 MG tablet [id number] Yes Take 1 tablet by mouth 2 (two) times daily. [provider] Taking Active   loratadine (CLARITIN) 10 MG tablet [id number]  Take 10 mg by mouth every morning. [provider]  Active Self  metFORMIN (GLUCOPHAGE) 500 MG tablet [id number] Yes Take 2 in morning with breakfast and take 1 tab in evening with dinner [name], DO Taking Active   metoprolol tartrate (LOPRESSOR) 25 MG tablet [id number] Yes TAKE 1 TABLET BY MOUTH TWICE DAILY Karamalegos, [name], DO Taking Active   nitroGLYCERIN (NITROSTAT) 0.4 MG SL tablet [id number]  Place under the tongue. [provider]  Active   tamsulosin (FLOMAX) 0.4 MG CAPS capsule [id number]  Take 1 capsule  (0.4 mg total) by mouth daily. [name], DO  Active   TRULICITY 3 TW/6.5KC [name] [id number] Yes INJECT 3 MG (0.5 ML) UNDER THE SKIN ONCE A WEEK Karamalegos, [name], DO Taking Active               Assessment/Plan:   Diabetes: - Counsel on importance of having well-balanced meals, while controlling carbohydrate portion sizes - Recommend to continue to check glucose and keep a log of the results - Review with patient strategies for managing side effects when related to Trulicity, including having smaller meals spread throughout the day, avoiding fatty foods, avoiding eating when full and eating more bland foods   Hypertension: - Have reviewed appropriate blood pressure monitoring technique and reviewed goal blood pressure.  - Recommend patient to continue to monitor home BP, keep log of results including HR readings and bring this record to medical appointments      Follow Up Plan: Clinical Pharmacist to follow up with patient by telephone on 06/07/2022 at 11 am   Wallace Cullens, PharmD, [place], [place] [phone]

## 2022-03-12 DIAGNOSIS — C44311 Basal cell carcinoma of skin of nose: Secondary | ICD-10-CM | POA: Diagnosis not present

## 2022-03-18 ENCOUNTER — Other Ambulatory Visit: Payer: Self-pay | Admitting: Family Medicine

## 2022-03-18 DIAGNOSIS — I10 Essential (primary) hypertension: Secondary | ICD-10-CM

## 2022-03-19 NOTE — Telephone Encounter (Signed)
Requested Prescriptions  Pending Prescriptions Disp Refills   amLODipine (NORVASC) 10 MG tablet [Pharmacy Med Name: AMLODIPINE BESYLATE 10 MG TAB] 90 tablet 0    Sig: TAKE 1 TABLET BY MOUTH ONCE DAILY     Cardiovascular: Calcium Channel Blockers 2 Failed - 03/18/2022  9:19 AM      Failed - Last BP in normal range    BP Readings from Last 1 Encounters:  02/08/22 (!) 142/88         Failed - Valid encounter within last 6 months    Recent Outpatient Visits           1 week ago Type 2 diabetes mellitus with other specified complication, without long-term current use of insulin [facility])   [facility] [place], [place] Ormond A, RPH-CPP   1 month ago Acute cough   [facility] [place], [name], DO   3 months ago Type 2 diabetes mellitus with other specified complication, without long-term current use of insulin [facility])   [facility] [place], Grayland Ormond A, RPH-CPP   6 months ago Acute non-recurrent frontal sinusitis   [place], DO   7 months ago Neck pain on right side   [facility] [place], [place], [place]              Passed - Last Heart Rate in normal range    Pulse Readings from Last 1 Encounters:  02/08/22 70

## 2022-04-29 ENCOUNTER — Other Ambulatory Visit: Payer: Self-pay | Admitting: Family Medicine

## 2022-04-29 DIAGNOSIS — F331 Major depressive disorder, recurrent, moderate: Secondary | ICD-10-CM

## 2022-04-30 NOTE — Telephone Encounter (Signed)
Rx was sent to pharmacy on 01/30/22 #90/1 RF.  Requested Prescriptions  Pending Prescriptions Disp Refills   escitalopram (LEXAPRO) 20 MG tablet [Pharmacy Med Name: ESCITALOPRAM OXALATE 20 MG TAB] 90 tablet 1    Sig: TAKE 1 TABLET BY MOUTH ONCE DAILY AS DIRECTED TAPER AS DIRECTED. OFF ZOLOFT AND ON TO ESCITALOPRAM     Psychiatry:  Antidepressants - SSRI Failed - 04/29/2022  3:29 PM      Failed - Valid encounter within last 6 months    Recent Outpatient Visits           1 month ago Type 2 diabetes mellitus with other specified complication, without long-term current use of insulin [facility])   [facility] [place], Grayland Ormond A, RPH-CPP   2 months ago Acute cough   [facility] [place], [name], DO   4 months ago Type 2 diabetes mellitus with other specified complication, without long-term current use of insulin [facility]   [place], Grayland Ormond A, RPH-CPP   8 months ago Acute non-recurrent frontal sinusitis   [place], DO   9 months ago Neck pain on right side   [facility] [place], [employer], [place]              Passed - Completed PHQ-2 or PHQ-9 in the last 360 days

## 2022-05-22 ENCOUNTER — Other Ambulatory Visit: Payer: Self-pay | Admitting: Family Medicine

## 2022-05-22 DIAGNOSIS — I10 Essential (primary) hypertension: Secondary | ICD-10-CM

## 2022-05-22 DIAGNOSIS — E1169 Type 2 diabetes mellitus with other specified complication: Secondary | ICD-10-CM

## 2022-05-23 NOTE — Telephone Encounter (Signed)
Requested Prescriptions  Pending Prescriptions Disp Refills   metFORMIN (GLUCOPHAGE) 500 MG tablet [Pharmacy Med Name: METFORMIN HCL 500 MG TAB] 270 tablet 0    Sig: TAKE 2 TABLETS BY MOUTH ONCE EVERY MORNING WITH BREAKFAST AND 1 TABLET ONCEEVERY EVENING WITH DINNER     Endocrinology:  Diabetes - Biguanides Failed - 05/22/2022  9:48 AM      Failed - Cr in normal range and within 360 days    Creat  Date Value Ref Range Status  02/22/2021 0.88 0.70 - 1.28 mg/dL Final   Creatinine, POC  Date Value Ref Range Status  03/09/2015 0 mg/dL Final         Failed - HBA1C is between 0 and 7.9 and within 180 days    Hemoglobin A1C  Date Value Ref Range Status  07/27/2012 7.6 (H) 4.2 - 6.3 % Final    Comment:    The American Diabetes Association recommends that a primary goal of therapy should be <7% and that physicians should reevaluate the treatment regimen in patients with HbA1c values consistently >8%.    Hgb A1c MFr Bld  Date Value Ref Range Status  02/22/2021 7.3 (H) <5.7 % of total Hgb Final    Comment:    For someone without known diabetes, a hemoglobin A1c value of 6.5% or greater indicates that they may have  diabetes and this should be confirmed with a follow-up  test. . For someone with known diabetes, a value <7% indicates  that their diabetes is well controlled and a value  greater than or equal to 7% indicates suboptimal  control. A1c targets should be individualized based on  duration of diabetes, age, comorbid conditions, and  other considerations. . Currently, no consensus exists regarding use of hemoglobin A1c for diagnosis of diabetes for children. .          Failed - eGFR in normal range and within 360 days    GFR, Est African American  Date Value Ref Range Status  03/02/2020 92 > OR = 60 mL/min/1.68m2 Final   GFR, Est Non African American  Date Value Ref Range Status  03/02/2020 79 > OR = 60 mL/min/1.56m2 Final   eGFR  Date Value Ref Range Status   02/22/2021 93 > OR = 60 mL/min/1.85m2 Final    Comment:    The eGFR is based on the CKD-EPI 2021 equation. To calculate  the new eGFR from a previous Creatinine or Cystatin C result, go to https://www.kidney.org/professionals/ kdoqi/gfr%5Fcalculator          Failed - B12 Level in normal range and within 720 days    No results found for: "VITAMINB12"       Failed - Valid encounter within last 6 months    Recent Outpatient Visits           2 months ago Type 2 diabetes mellitus with other specified complication, without long-term current use of insulin [facility])   [place] [facility] Delles, Gentry Fitz A, RPH-CPP   3 months ago Acute cough   [place] [place], Netta Neat, DO   5 months ago Type 2 diabetes mellitus with other specified complication, without long-term current use of insulin [facility])   [place] [facility] Delles, Gentry Fitz A, RPH-CPP   8 months ago Acute non-recurrent frontal sinusitis   [place] [facility] Smitty Cords, DO   10 months ago Neck pain on right side   [facility] [place], [place], NP              Failed - CBC within normal limits and completed in the last 12 months    WBC  Date Value Ref Range Status  02/22/2021 6.3 3.8 - 10.8 Thousand/uL Final   RBC  Date Value Ref Range Status  02/22/2021 4.61 4.20 - 5.80 Million/uL Final   Hemoglobin  Date Value Ref Range Status  02/22/2021 15.2 13.2 - 17.1 g/dL Final   HGB  Date Value Ref Range Status  06/20/2011 14.9 13.0 - 18.0 g/dL Final   HCT  Date Value Ref Range Status  02/22/2021 43.7 38.5 - 50.0 % Final  06/20/2011 42.8 40.0 - 52.0 % Final   MCHC  Date Value Ref Range Status  02/22/2021 34.8 32.0 - 36.0 g/dL Final   [facility]  Date Value Ref Range Status  02/22/2021 33.0 27.0 - 33.0 pg Final   MCV  Date Value Ref Range Status  02/22/2021 94.8 80.0 - 100.0 fL Final   06/20/2011 93 80 - 100 fL Final   No results found for: "PLTCOUNTKUC", "LABPLAT", "POCPLA" RDW  Date Value Ref Range Status  02/22/2021 13.2 11.0 - 15.0 % Final  06/20/2011 13.2 11.5 - 14.5 % Final          metoprolol tartrate (LOPRESSOR) 25 MG tablet [Pharmacy Med Name: METOPROLOL TARTRATE 25 MG TAB] 180 tablet 0    Sig: TAKE 1 TABLET BY MOUTH TWICE DAILY     Cardiovascular:  Beta Blockers Failed - 05/22/2022  9:48 AM      Failed - Last BP in normal range    BP Readings from Last 1 Encounters:  02/08/22 (!) 142/88         Failed - Valid encounter within last 6 months    Recent Outpatient Visits           2 months ago Type 2 diabetes mellitus with other specified complication, without long-term current use of insulin (HCC)   [place] [facility] Delles, [place] A, RPH-CPP   3 months ago Acute cough   [place] [facility] Smitty Cords, DO   5 months ago Type 2 diabetes mellitus with other specified complication, without long-term current use of insulin (HCC)   [place] [facility] Delles, Gentry Fitz A, RPH-CPP   8 months ago Acute non-recurrent frontal sinusitis   [place] [facility] Smitty Cords, DO   10 months ago Neck pain on right side   [place] [facility] [place], [place], NP              Passed - Last Heart Rate in normal range    Pulse Readings from Last 1 Encounters:  02/08/22 70          atorvastatin (LIPITOR) 40 MG tablet [Pharmacy Med Name: ATORVASTATIN CALCIUM 40 MG TAB] 90 tablet 0    Sig: TAKE 1 TABLET BY MOUTH ONCE EVERY EVENING     Cardiovascular:  Antilipid - Statins Failed - 05/22/2022  9:48 AM      Failed - Lipid Panel in normal range within the last 12 months    Cholesterol  Date Value Ref Range Status  02/22/2021 122 <200 mg/dL Final  16/11/9602 540 0 - 200 mg/dL Final   Ldl Cholesterol, Calc  Date Value Ref Range  Status  07/27/2012 77 0 - 100 mg/dL Final  LDL Cholesterol (Calc)  Date Value Ref Range Status  02/22/2021 51 mg/dL (calc) Final    Comment:    Reference range: <100 . Desirable range <100 mg/dL for primary prevention;   <70 mg/dL for patients with CHD or diabetic patients  with > or = 2 CHD risk factors. Marland Kitchen LDL-C is now calculated using the Martin-Hopkins  calculation, which is a validated novel method providing  better accuracy than the Friedewald equation in the  estimation of LDL-C.  Horald Pollen et al. Lenox Ahr. 1610;960(45): 2061-2068  (http://education.QuestDiagnostics.com/faq/FAQ164)    HDL Cholesterol  Date Value Ref Range Status  07/27/2012 34 (L) 40 - 60 mg/dL Final   HDL  Date Value Ref Range Status  02/22/2021 38 (L) > OR = 40 mg/dL Final   Triglycerides  Date Value Ref Range Status  02/22/2021 315 (H) <150 mg/dL Final    Comment:    . If a non-fasting specimen was collected, consider repeat triglyceride testing on a fasting specimen if clinically indicated.  Perry Mount et al. J. of Clin. Lipidol. 2015;9:129-169. .   07/27/2012 212 (H) 0 - 200 mg/dL Final         Passed - Patient is not pregnant      Passed - Valid encounter within last 12 months    Recent Outpatient Visits           2 months ago Type 2 diabetes mellitus with other specified complication, without long-term current use of insulin [facility])   [place] [facility] Delles, Gentry Fitz A, RPH-CPP   3 months ago Acute cough   [place] [facility] [place], Netta Neat, DO   5 months ago Type 2 diabetes mellitus with other specified complication, without long-term current use of insulin [facility])   [place] [facility] Delles, Gentry Fitz A, RPH-CPP   8 months ago Acute non-recurrent frontal sinusitis   [facility] Smitty Cords, DO   10 months ago Neck pain on right side   [facility] [place], [place], [place]

## 2022-05-28 DIAGNOSIS — R197 Diarrhea, unspecified: Secondary | ICD-10-CM | POA: Diagnosis not present

## 2022-05-28 DIAGNOSIS — R634 Abnormal weight loss: Secondary | ICD-10-CM | POA: Diagnosis not present

## 2022-05-28 DIAGNOSIS — R1310 Dysphagia, unspecified: Secondary | ICD-10-CM | POA: Diagnosis not present

## 2022-05-28 DIAGNOSIS — R112 Nausea with vomiting, unspecified: Secondary | ICD-10-CM | POA: Diagnosis not present

## 2022-05-29 ENCOUNTER — Other Ambulatory Visit: Payer: Self-pay | Admitting: Nurse Practitioner

## 2022-05-29 DIAGNOSIS — R112 Nausea with vomiting, unspecified: Secondary | ICD-10-CM

## 2022-05-30 ENCOUNTER — Ambulatory Visit
Admission: RE | Admit: 2022-05-30 | Discharge: 2022-05-30 | Disposition: A | Discharge status: Home / Self Care | Payer: Medicare HMO | Source: Ambulatory Visit | Attending: Nurse Practitioner | Admitting: Nurse Practitioner

## 2022-05-30 DIAGNOSIS — R634 Abnormal weight loss: Secondary | ICD-10-CM | POA: Diagnosis not present

## 2022-05-30 DIAGNOSIS — R112 Nausea with vomiting, unspecified: Secondary | ICD-10-CM | POA: Insufficient documentation

## 2022-05-30 DIAGNOSIS — R1310 Dysphagia, unspecified: Secondary | ICD-10-CM | POA: Diagnosis not present

## 2022-05-30 DIAGNOSIS — N200 Calculus of kidney: Secondary | ICD-10-CM | POA: Diagnosis not present

## 2022-05-30 DIAGNOSIS — R197 Diarrhea, unspecified: Secondary | ICD-10-CM | POA: Diagnosis not present

## 2022-05-30 LAB — POCT I-STAT CREATININE: Creatinine, Ser: 1.1 mg/dL (ref 0.61–1.24)

## 2022-05-30 MED ORDER — IOHEXOL 300 MG/ML  SOLN
100.0000 mL | Freq: Once | INTRAMUSCULAR | Status: AC | PRN
  Administered 2022-05-30: 100 mL via INTRAVENOUS

## 2022-06-05 ENCOUNTER — Other Ambulatory Visit: Payer: Self-pay | Admitting: Family Medicine

## 2022-06-05 DIAGNOSIS — I10 Essential (primary) hypertension: Secondary | ICD-10-CM

## 2022-06-05 NOTE — Telephone Encounter (Signed)
Requested Prescriptions  Pending Prescriptions Disp Refills   amLODipine (NORVASC) 10 MG tablet [Pharmacy Med Name: AMLODIPINE BESYLATE 10 MG TAB] 90 tablet 0    Sig: TAKE 1 TABLET BY MOUTH ONCE DAILY     Cardiovascular: Calcium Channel Blockers 2 Failed - 06/05/2022 10:02 AM      Failed - Last BP in normal range    BP Readings from Last 1 Encounters:  02/08/22 (!) 142/88         Failed - Valid encounter within last 6 months    Recent Outpatient Visits           2 months ago Type 2 diabetes mellitus with other specified complication, without long-term current use of insulin [facility])   [place] [facility] Delles, Gentry Fitz A, RPH-CPP   3 months ago Acute cough   [place] [facility] [place], Netta Neat, DO   5 months ago Type 2 diabetes mellitus with other specified complication, without long-term current use of insulin [facility])   [place] [facility] Delles, Gentry Fitz A, RPH-CPP   9 months ago Acute non-recurrent frontal sinusitis   [place] [facility] Smitty Cords, DO   10 months ago Neck pain on right side   [place] [facility] [place], [place], [place]              Passed - Last Heart Rate in normal range    Pulse Readings from Last 1 Encounters:  02/08/22 70

## 2022-06-07 ENCOUNTER — Telehealth: Payer: Medicare HMO

## 2022-06-07 ENCOUNTER — Telehealth: Payer: Self-pay | Admitting: Pharmacist

## 2022-06-07 NOTE — Telephone Encounter (Signed)
   Outreach Note  06/07/2022 Name: JESSY REDHOUSE MRN: [id number] DOB: 1951/01/10  Referred by: Smitty Cords, DO Reason for referral : No chief complaint on file.   Was unable to reach patient via telephone today and have left HIPAA compliant voicemail asking patient to return my call.    Follow Up Plan: Will attempt to reach patient by telephone again within the next 30 days  Estelle Grumbles, PharmD, [facility] Clinical Pharmacist [facility] [phone]

## 2022-06-27 ENCOUNTER — Other Ambulatory Visit: Payer: Self-pay | Admitting: Family Medicine

## 2022-06-27 DIAGNOSIS — I10 Essential (primary) hypertension: Secondary | ICD-10-CM

## 2022-06-27 DIAGNOSIS — F331 Major depressive disorder, recurrent, moderate: Secondary | ICD-10-CM

## 2022-06-27 NOTE — Telephone Encounter (Signed)
Requested medication (s) are due for refill today:   Yes Wellbutrin,   No for atorvastatin - requested too soon and refused earlier today.    Requested medication (s) are on the active medication list:   Yes for both  Future visit scheduled:   Yes with Pharmacist Estelle Grumbles for 07/08/2022.   No appt. Noted with Dr. Althea Charon.   Last ordered: Wellbutrin 12/12/2021 #90, 1 refill;  Atorvastatin 05/23/2022 #90, 0 refills.    Returned because not sure if Gentry Fitz is regulating his meds if so pt. Has an appt with her for 07/08/2022.    Pharmacist/provider to review for refills.

## 2022-07-02 DIAGNOSIS — Z85828 Personal history of other malignant neoplasm of skin: Secondary | ICD-10-CM | POA: Diagnosis not present

## 2022-07-02 DIAGNOSIS — D225 Melanocytic nevi of trunk: Secondary | ICD-10-CM | POA: Diagnosis not present

## 2022-07-02 DIAGNOSIS — Z86007 Personal history of in-situ neoplasm of skin: Secondary | ICD-10-CM | POA: Diagnosis not present

## 2022-07-02 DIAGNOSIS — S80811A Abrasion, right lower leg, initial encounter: Secondary | ICD-10-CM | POA: Diagnosis not present

## 2022-07-02 DIAGNOSIS — L814 Other melanin hyperpigmentation: Secondary | ICD-10-CM | POA: Diagnosis not present

## 2022-07-02 DIAGNOSIS — L570 Actinic keratosis: Secondary | ICD-10-CM | POA: Diagnosis not present

## 2022-07-02 DIAGNOSIS — I788 Other diseases of capillaries: Secondary | ICD-10-CM | POA: Diagnosis not present

## 2022-07-02 DIAGNOSIS — Z08 Encounter for follow-up examination after completed treatment for malignant neoplasm: Secondary | ICD-10-CM | POA: Diagnosis not present

## 2022-07-04 ENCOUNTER — Other Ambulatory Visit: Payer: Self-pay | Admitting: Family Medicine

## 2022-07-04 DIAGNOSIS — I872 Venous insufficiency (chronic) (peripheral): Secondary | ICD-10-CM

## 2022-07-05 DIAGNOSIS — E119 Type 2 diabetes mellitus without complications: Secondary | ICD-10-CM | POA: Diagnosis not present

## 2022-07-05 DIAGNOSIS — H26493 Other secondary cataract, bilateral: Secondary | ICD-10-CM | POA: Diagnosis not present

## 2022-07-05 DIAGNOSIS — Z0100 Encounter for examination of eyes and vision without abnormal findings: Secondary | ICD-10-CM | POA: Diagnosis not present

## 2022-07-05 DIAGNOSIS — E113312 Type 2 diabetes mellitus with moderate nonproliferative diabetic retinopathy with macular edema, left eye: Secondary | ICD-10-CM | POA: Diagnosis not present

## 2022-07-05 LAB — HM DIABETES EYE EXAM

## 2022-07-05 NOTE — Telephone Encounter (Signed)
Requested medications are due for refill today.  yes  Requested medications are on the active medications list.  yes  Last refill. 09/05/2021 #90 1 rf  Future visit scheduled.   With pharmacy  Notes to clinic.  Labs are expired.    Requested Prescriptions  Pending Prescriptions Disp Refills   furosemide (LASIX) 40 MG tablet [Pharmacy Med Name: FUROSEMIDE 40 MG TAB] 90 tablet 1    Sig: TAKE 1 TABLET BY MOUTH ONCE DAILY AS NEEDED     Cardiovascular:  Diuretics - Loop Failed - 07/04/2022  4:31 PM      Failed - K in normal range and within 180 days    Potassium  Date Value Ref Range Status  02/22/2021 3.8 3.5 - 5.3 mmol/L Final  09/19/2011 3.7 3.5 - 5.1 mmol/L Final         Failed - Ca in normal range and within 180 days    Calcium  Date Value Ref Range Status  02/22/2021 9.2 8.6 - 10.3 mg/dL Final   Calcium, Total  Date Value Ref Range Status  09/19/2011 8.8 8.5 - 10.1 mg/dL Final         Failed - Na in normal range and within 180 days    Sodium  Date Value Ref Range Status  02/22/2021 137 135 - 146 mmol/L Final  09/19/2011 136 136 - 145 mmol/L Final         Failed - Cl in normal range and within 180 days    Chloride  Date Value Ref Range Status  02/22/2021 100 98 - 110 mmol/L Final  09/19/2011 101 98 - 107 mmol/L Final         Failed - Mg Level in normal range and within 180 days    No results found for: "MG"       Failed - Last BP in normal range    BP Readings from Last 1 Encounters:  02/08/22 (!) 142/88         Failed - Valid encounter within last 6 months    Recent Outpatient Visits           3 months ago Type 2 diabetes mellitus with other specified complication, without long-term current use of insulin [facility])   [place] [facility] Delles, [address] A, RPH-CPP   4 months ago Acute cough   [place] [facility] Smitty Cords, DO   6 months ago Type 2 diabetes mellitus with other specified complication,  without long-term current use of insulin [facility])   [place] [facility] Delles, Gentry Fitz A, RPH-CPP   10 months ago Acute non-recurrent frontal sinusitis   [place] [facility] Smitty Cords, DO   11 months ago Neck pain on right side   [place] [facility] [place], [place], NP              Passed - Cr in normal range and within 180 days    Creat  Date Value Ref Range Status  02/22/2021 0.88 0.70 - 1.28 mg/dL Final   Creatinine, Ser  Date Value Ref Range Status  05/30/2022 1.10 0.61 - 1.24 mg/dL Final   Creatinine, POC  Date Value Ref Range Status  03/09/2015 0 mg/dL Final

## 2022-07-08 ENCOUNTER — Ambulatory Visit: Payer: Medicare HMO | Admitting: Pharmacist

## 2022-07-08 DIAGNOSIS — E1169 Type 2 diabetes mellitus with other specified complication: Secondary | ICD-10-CM

## 2022-07-08 DIAGNOSIS — I10 Essential (primary) hypertension: Secondary | ICD-10-CM

## 2022-07-08 NOTE — Progress Notes (Signed)
07/08/2022 Name: Kevin Perry MRN: [id number] DOB: 1950/05/02  Chief Complaint  Patient presents with   Medication Management   Medication Assistance    EMMERT NICHTER is a 72 y.o. year old male who presented for a telephone visit.   They were referred to the pharmacist by their PCP for assistance in managing diabetes, hypertension, hyperlipidemia, and medication access.      Subjective:   Care Team: Primary Care Provider: Smitty Cords, DO Cardiologist: Olena Heckle, MD; Next Scheduled Visit: 11/25/2022 Gastroenterology: Upcoming Procedure: Colonoscopy on 08/27/2022   Medication Access/Adherence  Current Pharmacy:  [facility] [place], [place] - [address] [place] [address] Phone: [phone] Fax: [phone]  [facility] [address] [place] - [address] [place] [address] Phone: [phone] Fax: [phone]  [facility] [place], [place] - [facility] AT Portal to Registered [address] [place] [address] Phone: [phone] Fax: [phone]  [facility] [place], [place] - [address] [place] [address] Phone: [phone] Fax: [phone]   Patient reports affordability concerns with their medications: No  Patient reports access/transportation concerns to their pharmacy: No  Patient reports adherence concerns with their medications:  No     Today reports that his GI symptoms have significantly improved since completed course of Levaquin as ordered by GI Specialist on 4/29   Diabetes:   Current medications:  Metformin IR 500mg  x 2 in AM / x 1 in PM Trulicity 3 mg weekly Medications tried in the past: glipizide     Morning fasting glucose today: 118   Patient denies hypoglycemic s/sx including dizziness, shakiness, sweating.    Statin: atorvastatin 40 mg  daily   Current medication access support: APPROVED for Trulicity from [facility] Patient Assistance Program through 02/04/23     Hypertension:   Current medications:  Amlodipine 10mg  daily Furosemide 40 mg daily Lisinopril 20 mg twice daily Metoprolol tartrate 25 mg twice daily   Patient has an automated, upper arm home BP cuff   Reports home blood pressure reading today: 122/81   Objective:  Lab Results  Component Value Date   HGBA1C 7.3 (H) 02/22/2021    Lab Results  Component Value Date   CREATININE 1.10 05/30/2022   BUN 13 02/22/2021   NA 137 02/22/2021   K 3.8 02/22/2021   CL 100 02/22/2021   CO2 31 02/22/2021    Lab Results  Component Value Date   CHOL 122 02/22/2021   HDL 38 (L) 02/22/2021   LDLCALC 51 02/22/2021   TRIG 315 (H) 02/22/2021   CHOLHDL 3.2 02/22/2021   BP Readings from Last 3 Encounters:  02/08/22 (!) 142/88  10/25/21 (!) 154/80  07/26/21 128/74   Pulse Readings from Last 3 Encounters:  02/08/22 70  10/25/21 64  07/26/21 61     Medications Reviewed Today     Reviewed by Manuela Neptune, RPH-CPP (Pharmacist) on 03/08/22 at 1406  Med List Status: <None>   Medication Order Taking? Sig Documenting Provider Last Dose Status Informant  acetaminophen (TYLENOL) 325 MG tablet [id number]  Take by mouth. [provider]  Active   amLODipine (NORVASC) 10 MG tablet [id number] Yes Take 1 tablet (10 mg total) by mouth daily. Smitty Cords, DO Taking Active   aspirin EC 81 MG tablet [id number]  Take 81 mg by mouth  daily. Swallow whole. [provider]  Active   atorvastatin (LIPITOR) 40 MG tablet [id number] Yes TAKE 1 TABLET BY MOUTH ONCE EVERY EVENING Karamalegos, Alexander Shela [place], DO Taking Active   buPROPion (WELLBUTRIN XL) 300 MG 24 hr tablet [id number]  Take 1 tablet (300 mg total) by mouth daily. Smitty Cords, DO  Active   escitalopram (LEXAPRO) 20 MG tablet [id number]  Take 1 tablet (20 mg total) by mouth daily.  Karamalegos, Netta Neat, DO  Active   folic acid (FOLVITE) 1 MG tablet [id number]  Take 1 mg by mouth See admin instructions. Take 1 mg daily except skip dose on Wednesdays (methotrexate day) [provider]  Active Self  furosemide (LASIX) 40 MG tablet [id number] Yes TAKE 1 TABLET BY MOUTH ONCE DAILY AS NEEDED  Patient taking differently: Take 40 mg by mouth daily.   Smitty Cords, DO Taking Active   gabapentin (NEURONTIN) 100 MG capsule [id number]  Take 1 capsule (100 mg total) by mouth 2 (two) times daily. Karamalegos, Netta Neat, DO  Active   lisinopril (ZESTRIL) 20 MG tablet [id number] Yes Take 1 tablet by mouth 2 (two) times daily. [provider] Taking Active   loratadine (CLARITIN) 10 MG tablet [id number]  Take 10 mg by mouth every morning. [provider]  Active Self  metFORMIN (GLUCOPHAGE) 500 MG tablet [id number] Yes Take 2 in morning with breakfast and take 1 tab in evening with dinner Smitty Cords, DO Taking Active   metoprolol tartrate (LOPRESSOR) 25 MG tablet [id number] Yes TAKE 1 TABLET BY MOUTH TWICE DAILY Karamalegos, Netta Neat, DO Taking Active   nitroGLYCERIN (NITROSTAT) 0.4 MG SL tablet [id number]  Place under the tongue. [provider]  Active   tamsulosin (FLOMAX) 0.4 MG CAPS capsule [id number]  Take 1 capsule (0.4 mg total) by mouth daily. Smitty Cords, DO  Active   TRULICITY 3 MG/0.5ML Namon Cirri [id number] Yes INJECT 3 MG (0.5 ML) UNDER THE SKIN ONCE A WEEK Karamalegos, Netta Neat, DO Taking Active               Assessment/Plan:     Diabetes: - Have counseled on importance of having well-balanced meals, while controlling carbohydrate portion sizes - Recommend to continue to check glucose and keep a log of the results   Hypertension: - Have reviewed appropriate blood pressure monitoring technique and reviewed goal blood pressure.  - Recommend patient to continue to monitor home BP, keep log of  results including HR readings and bring this record to medical appointments      Follow Up Plan: Clinical Pharmacist to follow up with patient by telephone on 12/09/2022 at 1:45 PM for medication assistance program renewal   Estelle Grumbles, PharmD, [place], CPP Clinical Pharmacist [facility] [phone]

## 2022-07-08 NOTE — Patient Instructions (Addendum)
Goals Addressed             This Visit's Progress    Pharmacy Goals       Our goal A1c is less than 7%. This corresponds with fasting sugars less than 130 and 2 hour after meal sugars less than 180. Please check your blood sugar and keep log of the results  Our goal bad cholesterol, or LDL, is less than 70 . This is why it is important to continue taking your atorvastatin   Please check your home blood pressure, keep a log of the results and have this to review during our next appointment.  Feel free to call me with any questions or concerns. I look forward to our next call!  Deshonna Trnka Omarri Eich, PharmD, BCACP Clinical Pharmacist [facility] [phone]        

## 2022-07-11 ENCOUNTER — Other Ambulatory Visit: Payer: Self-pay | Admitting: Family Medicine

## 2022-07-11 DIAGNOSIS — I872 Venous insufficiency (chronic) (peripheral): Secondary | ICD-10-CM

## 2022-07-11 NOTE — Telephone Encounter (Signed)
Refused by office 07/05/22- needs appointment- duplicate request Requested Prescriptions  Pending Prescriptions Disp Refills   furosemide (LASIX) 40 MG tablet [Pharmacy Med Name: FUROSEMIDE 40 MG TAB] 90 tablet 1    Sig: TAKE 1 TABLET BY MOUTH ONCE DAILY AS NEEDED     Cardiovascular:  Diuretics - Loop Failed - 07/11/2022 11:44 AM      Failed - K in normal range and within 180 days    Potassium  Date Value Ref Range Status  02/22/2021 3.8 3.5 - 5.3 mmol/L Final  09/19/2011 3.7 3.5 - 5.1 mmol/L Final         Failed - Ca in normal range and within 180 days    Calcium  Date Value Ref Range Status  02/22/2021 9.2 8.6 - 10.3 mg/dL Final   Calcium, Total  Date Value Ref Range Status  09/19/2011 8.8 8.5 - 10.1 mg/dL Final         Failed - Na in normal range and within 180 days    Sodium  Date Value Ref Range Status  02/22/2021 137 135 - 146 mmol/L Final  09/19/2011 136 136 - 145 mmol/L Final         Failed - Cl in normal range and within 180 days    Chloride  Date Value Ref Range Status  02/22/2021 100 98 - 110 mmol/L Final  09/19/2011 101 98 - 107 mmol/L Final         Failed - Mg Level in normal range and within 180 days    No results found for: "MG"       Failed - Last BP in normal range    BP Readings from Last 1 Encounters:  02/08/22 (!) 142/88         Failed - Valid encounter within last 6 months    Recent Outpatient Visits           3 days ago Type 2 diabetes mellitus with other specified complication, without long-term current use of insulin (HCC)   [place] [facility] Delles, Gentry Fitz A, RPH-CPP   4 months ago Type 2 diabetes mellitus with other specified complication, without long-term current use of insulin [facility])   [place] [facility] Delles, Gentry Fitz A, RPH-CPP   5 months ago Acute cough   [place] [facility] Smitty Cords, DO   7 months ago Type 2 diabetes mellitus with other specified  complication, without long-term current use of insulin [facility])   [place] [facility] Delles, Gentry Fitz A, RPH-CPP   10 months ago Acute non-recurrent frontal sinusitis   [place] [facility] Smitty Cords, DO              Passed - Cr in normal range and within 180 days    Creat  Date Value Ref Range Status  02/22/2021 0.88 0.70 - 1.28 mg/dL Final   Creatinine, Ser  Date Value Ref Range Status  05/30/2022 1.10 0.61 - 1.24 mg/dL Final   Creatinine, POC  Date Value Ref Range Status  03/09/2015 0 mg/dL Final

## 2022-07-12 ENCOUNTER — Other Ambulatory Visit: Payer: Self-pay

## 2022-07-12 DIAGNOSIS — I872 Venous insufficiency (chronic) (peripheral): Secondary | ICD-10-CM

## 2022-07-12 MED ORDER — FUROSEMIDE 40 MG PO TABS: 40.0000 mg | ORAL_TABLET | Freq: Every day | ORAL | 0 refills | Status: DC

## 2022-07-15 ENCOUNTER — Other Ambulatory Visit: Payer: Self-pay | Admitting: Family Medicine

## 2022-07-15 ENCOUNTER — Encounter: Payer: Self-pay | Admitting: Family Medicine

## 2022-07-15 ENCOUNTER — Ambulatory Visit (INDEPENDENT_AMBULATORY_CARE_PROVIDER_SITE_OTHER): Payer: Medicare HMO | Admitting: Family Medicine

## 2022-07-15 VITALS — BP 136/80 | HR 89 | Temp 97.9°F | Resp 17 | Ht 73.0 in | Wt 259.0 lb

## 2022-07-15 DIAGNOSIS — E1169 Type 2 diabetes mellitus with other specified complication: Secondary | ICD-10-CM

## 2022-07-15 DIAGNOSIS — N138 Other obstructive and reflux uropathy: Secondary | ICD-10-CM

## 2022-07-15 DIAGNOSIS — E1142 Type 2 diabetes mellitus with diabetic polyneuropathy: Secondary | ICD-10-CM

## 2022-07-15 DIAGNOSIS — E785 Hyperlipidemia, unspecified: Secondary | ICD-10-CM

## 2022-07-15 DIAGNOSIS — R351 Nocturia: Secondary | ICD-10-CM

## 2022-07-15 DIAGNOSIS — E669 Obesity, unspecified: Secondary | ICD-10-CM

## 2022-07-15 DIAGNOSIS — I10 Essential (primary) hypertension: Secondary | ICD-10-CM

## 2022-07-15 DIAGNOSIS — E291 Testicular hypofunction: Secondary | ICD-10-CM

## 2022-07-15 DIAGNOSIS — Z0000 Encounter for general adult medical examination without abnormal findings: Secondary | ICD-10-CM

## 2022-07-15 LAB — POCT GLYCOSYLATED HEMOGLOBIN (HGB A1C): Hemoglobin A1C: 6.8 % — AB (ref 4.0–5.6)

## 2022-07-15 NOTE — Patient Instructions (Addendum)
Thank you for coming to the office today.  Recent Labs    07/15/22 1027  HGBA1C 6.8*   Keep up with Colonoscopy plans.  Keep on Trulicity 3mg  and Metformin  DUE for FASTING BLOOD WORK (no food or drink after midnight before the lab appointment, only water or coffee without cream/sugar on the morning of)  SCHEDULE "Lab Only" visit in the morning at the clinic for lab draw in 6 MONTHS   - Make sure Lab Only appointment is at about 1 week before your next appointment, so that results will be available  For Lab Results, once available within 2-3 days of blood draw, you can can log in to MyChart online to view your results and a brief explanation. Also, we can discuss results at next follow-up visit.   Please schedule a Follow-up Appointment to: Return in about 6 months (around 01/14/2023) for 6 month fasting lab only then 1 week later Annual Physical .  If you have any other questions or concerns, please feel free to call the office or send a message through MyChart. You may also schedule an earlier appointment if necessary.  Additionally, you may be receiving a survey about your experience at our office within a few days to 1 week by e-mail or mail. We value your feedback.  Saralyn Pilar, DO [facility], [place]

## 2022-07-15 NOTE — Assessment & Plan Note (Signed)
A1c improved to 6.8  Complicated by Hyperglycemia, Hyperlipidemia, Mood, Cataract (s/p surgery), DM Retinopathy - increase risk of cardiovascular abnormality w/ known CAD - Prior Meds: Onglyza (ins/cost), Metformin XR 750 (GI intolerance) Off Glipizide  Plan:  1. Continue increased Metformin IR 500mg  x 2 in AM / x 1 in PM - CONTINUE current meds - Trulicity 3mg  weekly [place] inj - on PAP shipment - Counseling on risk hypoglycemia on sulfonylurea  Off Glipizide  2. Encourage improved lifestyle - low carb, low sugar diet, reduce portion size, goal for regular balanced 3 x daily meal instead of skipping lunch, continue improving regular exercise 3. Check CBG, bring log to next visit for review - check 3x weekly 4. Continue ASA, ACEi, Statin  DM Foot check today, Urine Microalbumin

## 2022-07-15 NOTE — Progress Notes (Signed)
Subjective:    Patient ID: Kevin Perry, male    DOB: [birth year], 72 y.o.   MRN: [id number]  Kevin Perry is a 72 y.o. male presenting on 07/15/2022 for Diabetes   HPI  CHRONIC DM, Type 2:  Last visit for Diabetes earlier in 2023. Last A1c 7.3 (02/2021)  Today due for A1c POC, result is 6.8, with improvement  On [employer] PAP program for Trulicity 3mg  weekly Meds: Trulicity 3mg  weekly, Metformin 2AM and 1 PM Off Glipizide Reports good compliance. Tolerating well w/o side-effects Currently on ACEi Lifestyle: - Diet (trying to improve diet reduce carb intake) - Exercise (walk regularly) Denies hypoglycemia, polyuria, visual changes, numbness or tingling.  CHRONIC HTN: Reports continues on meds doing well Current Meds - Current meds unchanged   Reports good compliance, took meds today. Tolerating well, w/o complaints. Denies CP, dyspnea, HA, edema, dizziness / lightheadedness  HYPERLIPIDEMIA: - Reports no concerns. Last lipid panel 2023, controlled on statin - Currently taking Atorvastatin 40mg , tolerating well without side effects or myalgias  Upcoming Colonoscopy July 2024  Fatigue / request Testosterone test next time     07/15/2022   10:19 AM 07/18/2021    8:53 AM 06/20/2021    9:01 AM  Depression screen PHQ 2/9  Decreased Interest 0 3 3  Down, Depressed, Hopeless 0 2 3  PHQ - 2 Score 0 5 6  Altered sleeping 0 2 2  Tired, decreased energy 1 2 2   Change in appetite 0 0 1  Feeling bad or failure about yourself  0 0 1  Trouble concentrating 0 1 1  Moving slowly or fidgety/restless 0 0 0  Suicidal thoughts 0 0 0  PHQ-9 Score 1 10 13   Difficult doing work/chores Not difficult at all Somewhat difficult Somewhat difficult    Social History   Tobacco Use   Smoking status: Former    Types: Cigarettes    Quit date: 08/16/2004    Years since quitting: 17.9   Smokeless tobacco: Former   Tobacco comments:    quit 20 plus years  Vaping Use   Vaping Use:  Never used  Substance Use Topics   Alcohol use: Not Currently    Alcohol/week: 2.0 standard drinks of alcohol    Types: 2 Cans of beer per week    Comment: SOCIAL   Drug use: No    Review of Systems Per HPI unless specifically indicated above     Objective:    BP 136/80 (BP Location: Right Arm, Patient Position: Sitting, Cuff Size: Normal)   Pulse 89   Temp 97.9 F (36.6 C) (Oral)   Resp 17   Ht 6\' 1"  (1.854 m)   Wt 259 lb (117.5 kg)   SpO2 96%   BMI 34.17 kg/m   Wt Readings from Last 3 Encounters:  07/15/22 259 lb (117.5 kg)  02/08/22 254 lb 6.4 oz (115.4 kg)  10/25/21 266 lb (120.7 kg)    Physical Exam Vitals and nursing note reviewed.  Constitutional:      General: He is not in acute distress.    Appearance: Normal appearance. He is well-developed. He is not diaphoretic.     Comments: Well-appearing, comfortable, cooperative  HENT:     Head: Normocephalic and atraumatic.  Eyes:     General:        Right eye: No discharge.        Left eye: No discharge.     Conjunctiva/sclera: Conjunctivae normal.  Cardiovascular:  Rate and Rhythm: Normal rate.  Pulmonary:     Effort: Pulmonary effort is normal.  Skin:    General: Skin is warm and dry.     Findings: No erythema or rash.  Neurological:     Mental Status: He is alert and oriented to person, place, and time.  Psychiatric:        Mood and Affect: Mood normal.        Behavior: Behavior normal.        Thought Content: Thought content normal.     Comments: Well groomed, good eye contact, normal speech and thoughts     Diabetic Foot Exam - Simple   Simple Foot Form Diabetic Foot exam was performed with the following findings: Yes 07/15/2022 10:35 AM  Visual Inspection See comments: Yes Sensation Testing Intact to touch and monofilament testing bilaterally: Yes Pulse Check Posterior Tibialis and Dorsalis pulse intact bilaterally: Yes Comments S/p complete Right toes 2nd and 3rd amputation, Left foot  partial 3rd toe amputation. Healed. He has callus formation, abnormality deformity of toes. He has thickened great toenails.     Recent Labs    07/15/22 1027  HGBA1C 6.8*    Results for orders placed or performed in visit on 07/15/22  POCT glycosylated hemoglobin (Hb A1C)  Result Value Ref Range   Hemoglobin A1C 6.8 (A) 4.0 - 5.6 %      Assessment & Plan:   Problem List Items Addressed This Visit     Essential hypertension    Improved BP - Home BP readings normal previously History of MI / CAD. Peripheral edema   Plan:  Continue current BP regimen - Amlodipine 10mg  daily, Metoprolol 25 BID, Furosemide 40mg  daily, Lisinopril 20mg  daily 2. Encourage improved lifestyle - low sodium diet, regular exercise 3. Continue monitor BP outside office, bring readings to next visit, if persistently >140/90 or new symptoms notify office sooner       Hyperlipidemia associated with type 2 diabetes mellitus (HCC)    Well controlled cholesterol on statin and lifestyle - now has some high TG still Calculated ASCVD 10 yr risk score elevated  Plan: 1. Continue current meds - Rosuvastatin 10mg  daily 2. Continue ASA 81mg  for primary ASCVD risk reduction 3. Encourage improved lifestyle - low carb/cholesterol, reduce portion size, continue improving regular exercise      Type 2 diabetes mellitus with other specified complication (HCC) - Primary    A1c improved to 6.8  Complicated by Hyperglycemia, Hyperlipidemia, Mood, Cataract (s/p surgery), DM Retinopathy - increase risk of cardiovascular abnormality w/ known CAD - Prior Meds: Onglyza (ins/cost), Metformin XR 750 (GI intolerance) Off Glipizide  Plan:  1. Continue increased Metformin IR 500mg  x 2 in AM / x 1 in PM - CONTINUE current meds - Trulicity 3mg  weekly  inj - on PAP shipment - Counseling on risk hypoglycemia on sulfonylurea  Off Glipizide  2. Encourage improved lifestyle - low carb, low sugar diet, reduce portion size, goal  for regular balanced 3 x daily meal instead of skipping lunch, continue improving regular exercise 3. Check CBG, bring log to next visit for review - check 3x weekly 4. Continue ASA, ACEi, Statin  DM Foot check today, Urine Microalbumin      Relevant Orders   POCT glycosylated hemoglobin (Hb A1C) (Completed)   Urine Microalbumin w/creat. ratio    No orders of the defined types were placed in this encounter.   Orders Placed This Encounter  Procedures   Urine Microalbumin w/creat. ratio  POCT glycosylated hemoglobin (Hb A1C)    Handicap placard form completed today for patient.   Follow up plan: Return in about 6 months (around 01/14/2023) for 6 month fasting lab only then 1 week later Annual Physical .  Future labs ordered for 01/2023 add testosterone 6 month lab.  Saralyn Pilar, DO [facility] 07/15/2022, 10:26 AM

## 2022-07-15 NOTE — Assessment & Plan Note (Signed)
Well controlled cholesterol on statin and lifestyle - now has some high TG still °Calculated ASCVD 10 yr risk score elevated ° °Plan: °1. Continue current meds - Rosuvastatin 10mg daily °2. Continue ASA 81mg for primary ASCVD risk reduction °3. Encourage improved lifestyle - low carb/cholesterol, reduce portion size, continue improving regular exercise °

## 2022-07-15 NOTE — Assessment & Plan Note (Signed)
Improved BP °- Home BP readings normal previously °History of MI / CAD. Peripheral edema °  °Plan:  °Continue current BP regimen - Amlodipine 10mg daily, Metoprolol 25 BID, Furosemide 40mg daily, Lisinopril 20mg daily °2. Encourage improved lifestyle - low sodium diet, regular exercise °3. Continue monitor BP outside office, bring readings to next visit, if persistently >140/90 or new symptoms notify office sooner ° °

## 2022-07-16 ENCOUNTER — Telehealth: Payer: Self-pay | Admitting: Podiatry

## 2022-07-16 ENCOUNTER — Ambulatory Visit: Payer: Medicare HMO | Admitting: Podiatry

## 2022-07-16 DIAGNOSIS — E0843 Diabetes mellitus due to underlying condition with diabetic autonomic (poly)neuropathy: Secondary | ICD-10-CM

## 2022-07-16 DIAGNOSIS — L97522 Non-pressure chronic ulcer of other part of left foot with fat layer exposed: Secondary | ICD-10-CM

## 2022-07-16 MED ORDER — MUPIROCIN 2 % EX OINT: 1.0000 | TOPICAL_OINTMENT | Freq: Two times a day (BID) | CUTANEOUS | 1 refills | Status: DC

## 2022-07-16 MED ORDER — DOXYCYCLINE HYCLATE 100 MG PO TABS
100.0000 mg | ORAL_TABLET | Freq: Two times a day (BID) | ORAL | 0 refills | Status: DC
Start: 2022-07-16 — End: 2022-08-29

## 2022-07-16 NOTE — Progress Notes (Signed)
Chief Complaint  Patient presents with   Foot Ulcer    Patient came in today for left foot 3rd toe ulcer, started 2 weeks ago,     Subjective:  72 y.o. male with PMHx of diabetes mellitus presenting today for new ulcer development to the left third toe.  He first noticed it about 2 weeks ago.  Last seen in the office on 07/20/2021.  Patient believes that the adjacent toenail may be creating the wound.  He has been using a silicone toe cap to offload pressure from the area.  Presenting as an urgent work in today   Past Medical History:  Diagnosis Date   Anxiety    Arthritis    Coronary artery disease    Diabetes mellitus without complication (HCC)    Hypertension    Hypertensive retinopathy    Myocardial infarction [facility])    Postoperative nausea 11/27/2019    Past Surgical History:  Procedure Laterality Date   CATARACT EXTRACTION     CHOLECYSTECTOMY     CORONARY ANGIOPLASTY WITH STENT PLACEMENT N/A 02/04/2006   EYE SURGERY     FOOT SURGERY Left    DOS 8.1.14 HALLIX IPJ FUSION, SECOND MET OSTEOTOMY W/SCREW, HAM TOE REPAIR 2,,4 , PARTIAL AMP 3RD DIGIT    GALLBLADDER SURGERY     KNEE SURERY Right    LEFT HEART CATH AND CORONARY ANGIOGRAPHY Left 11/17/2019   Procedure: LEFT HEART CATH AND CORONARY ANGIOGRAPHY;  Surgeon: Dalia Heading, MD;  Location: [facility] INVASIVE CV LAB;  Service: Cardiovascular;  Laterality: Left;   MYRINGOTOMY WITH TUBE PLACEMENT Right 10/25/2021   Procedure: MYRINGOTOMY WITH BUTTERFLY TUBE PLACEMENT;  Surgeon: Vernie Murders, MD;  Location: [facility];  Service: ENT;  Laterality: Right;   TOTAL KNEE ARTHROPLASTY Right 08/30/2014   Procedure: TOTAL KNEE ARTHROPLASTY;  Surgeon: Kennedy Bucker, MD;  Location: [facility] ORS;  Service: Orthopedics;  Laterality: Right;    Allergies  Allergen Reactions   Naproxen Swelling and Anaphylaxis     Objective/Physical Exam General: The patient is alert and oriented x3 in no acute distress.  Dermatology:  Wound #1  noted to the left third toe measuring approximately 1.0 x 1.0 x 0.1 cm.  There is some epithelialization intermixed throughout the wound bed.  Mostly granular wound base.  No purulence.  No drainage or malodor.  Periwound is intact.  The ulcer does extend into subcutaneous tissue but there is no exposed bone muscle tendon ligament or joint.  No erythema or edema around the area   Vascular: Palpable pedal pulses bilaterally. Capillary refill within normal limits.  Clinically there is no concern for vascular compromise  Neurological: Diminished via light touch  Musculoskeletal Exam: History of prior toe amputations  Assessment: 1.  Ulcer left third toe secondary to diabetes mellitus 2. diabetes mellitus w/ peripheral neuropathy   Plan of Care:  -Patient was evaluated. -Medically necessary excisional debridement including subcutaneous tissue was performed using a tissue nipper and a chisel blade. Excisional debridement of all the necrotic nonviable tissue down to healthy bleeding viable tissue was performed with post-debridement measurements same as pre-. -The wound was cleansed and dry sterile dressing applied. -Prescription for doxycycline 100 mg 2 times daily #20 -Prescription for mupirocin ointment apply daily with a Band-Aid -Silicone toe spacers were dispensed to alleviate pressure between the adjacent digit and the ulcer -Return to clinic 2 weeks for follow-up   Felecia Shelling, DPM [facility]  Dr. Felecia Shelling, DPM  [address] [place], [place] [address]                Office [phone]  Fax [phone]

## 2022-07-16 NOTE — Telephone Encounter (Signed)
Pt's daughter stated that dad has an infection on left, 3rd toe. Spoke with Dr. Logan Bores stated for pt to come in & be seen

## 2022-07-18 LAB — MICROALBUMIN / CREATININE URINE RATIO
Creatinine, Urine: 99 mg/dL (ref 20–320)
Microalb Creat Ratio: 28 mg/g creat (ref <30)
Microalb, Ur: 2.8 mg/dL

## 2022-07-29 DIAGNOSIS — L853 Xerosis cutis: Secondary | ICD-10-CM | POA: Diagnosis not present

## 2022-07-29 DIAGNOSIS — L2089 Other atopic dermatitis: Secondary | ICD-10-CM | POA: Diagnosis not present

## 2022-07-29 DIAGNOSIS — Z79899 Other long term (current) drug therapy: Secondary | ICD-10-CM | POA: Diagnosis not present

## 2022-07-29 DIAGNOSIS — L810 Postinflammatory hyperpigmentation: Secondary | ICD-10-CM | POA: Diagnosis not present

## 2022-07-29 DIAGNOSIS — L298 Other pruritus: Secondary | ICD-10-CM | POA: Diagnosis not present

## 2022-07-31 ENCOUNTER — Other Ambulatory Visit: Payer: Self-pay | Admitting: Family Medicine

## 2022-07-31 DIAGNOSIS — F331 Major depressive disorder, recurrent, moderate: Secondary | ICD-10-CM

## 2022-07-31 DIAGNOSIS — I872 Venous insufficiency (chronic) (peripheral): Secondary | ICD-10-CM

## 2022-08-01 NOTE — Telephone Encounter (Signed)
Has physical 07/15/2022.  Next visit for 01/20/2023.  Per note these labs in protocol will be checked at the visit in Dec. 2024 so based on this refills given,  Requested Prescriptions  Pending Prescriptions Disp Refills   furosemide (LASIX) 40 MG tablet [Pharmacy Med Name: FUROSEMIDE 40 MG TAB] 90 tablet 1    Sig: TAKE 1 TABLET BY MOUTH ONCE DAILY     Cardiovascular:  Diuretics - Loop Failed - 07/31/2022  2:39 PM      Failed - K in normal range and within 180 days    Potassium  Date Value Ref Range Status  02/22/2021 3.8 3.5 - 5.3 mmol/L Final  09/19/2011 3.7 3.5 - 5.1 mmol/L Final         Failed - Ca in normal range and within 180 days    Calcium  Date Value Ref Range Status  02/22/2021 9.2 8.6 - 10.3 mg/dL Final   Calcium, Total  Date Value Ref Range Status  09/19/2011 8.8 8.5 - 10.1 mg/dL Final         Failed - Na in normal range and within 180 days    Sodium  Date Value Ref Range Status  02/22/2021 137 135 - 146 mmol/L Final  09/19/2011 136 136 - 145 mmol/L Final         Failed - Cl in normal range and within 180 days    Chloride  Date Value Ref Range Status  02/22/2021 100 98 - 110 mmol/L Final  09/19/2011 101 98 - 107 mmol/L Final         Failed - Mg Level in normal range and within 180 days    No results found for: "MG"       Passed - Cr in normal range and within 180 days    Creat  Date Value Ref Range Status  02/22/2021 0.88 0.70 - 1.28 mg/dL Final   Creatinine, Ser  Date Value Ref Range Status  05/30/2022 1.10 0.61 - 1.24 mg/dL Final   Creatinine, POC  Date Value Ref Range Status  03/09/2015 0 mg/dL Final   Creatinine, Urine  Date Value Ref Range Status  07/15/2022 99 20 - 320 mg/dL Final         Passed - Last BP in normal range    BP Readings from Last 1 Encounters:  07/15/22 136/80         Passed - Valid encounter within last 6 months    Recent Outpatient Visits           2 weeks ago Type 2 diabetes mellitus with other specified  complication, without long-term current use of insulin (HCC)   [place] [facility] P[place], Netta Neat, DO   3 weeks ago Type 2 diabetes mellitus with other specified complication, without long-term current use of insulin [facility])   [place] [facility] Delles, Gentry Fitz A, RPH-CPP   4 months ago Type 2 diabetes mellitus with other specified complication, without long-term current use of insulin [facility])   [place] [facility] Delles, Gentry Fitz A, RPH-CPP   5 months ago Acute cough   [place] [facility] Smitty Cords, DO   7 months ago Type 2 diabetes mellitus with other specified complication, without long-term current use of insulin (HCC)   [place] [facility] Delles, Jackelyn Poling, RPH-CPP       Future Appointments  In 5 months Althea Charon, Netta Neat, DO [place] [facility]             escitalopram (LEXAPRO) 20 MG tablet [Pharmacy Med Name: ESCITALOPRAM OXALATE 20 MG TAB] 90 tablet 1    Sig: TAKE 1 TABLET BY MOUTH ONCE DAILY AS DIRECTED TAPER AS DIRECTED. OFF ZOLOFT AND ON TO ESCITALOPRAM     Psychiatry:  Antidepressants - SSRI Passed - 07/31/2022  2:39 PM      Passed - Completed PHQ-2 or PHQ-9 in the last 360 days      Passed - Valid encounter within last 6 months    Recent Outpatient Visits           2 weeks ago Type 2 diabetes mellitus with other specified complication, without long-term current use of insulin [facility])   [place] [facility] Rosita, Netta Neat, DO   3 weeks ago Type 2 diabetes mellitus with other specified complication, without long-term current use of insulin [facility])   [place] [facility] Delles, Gentry Fitz A, RPH-CPP   4 months ago Type 2 diabetes mellitus with other specified complication, without long-term current use of insulin [facility])   [place] [facility] Delles, Gentry Fitz A, RPH-CPP   5 months ago Acute cough   [place] [facility] [place], Netta Neat, DO   7 months ago Type 2 diabetes mellitus with other specified complication, without long-term current use of insulin (HCC)   [place] [facility] Delles, Jackelyn Poling, RPH-CPP       Future Appointments             In 5 months Althea Charon, Netta Neat, DO [place] [facility]

## 2022-08-06 ENCOUNTER — Ambulatory Visit: Payer: Medicare HMO | Admitting: Podiatry

## 2022-08-06 DIAGNOSIS — L97522 Non-pressure chronic ulcer of other part of left foot with fat layer exposed: Secondary | ICD-10-CM | POA: Diagnosis not present

## 2022-08-06 DIAGNOSIS — E0843 Diabetes mellitus due to underlying condition with diabetic autonomic (poly)neuropathy: Secondary | ICD-10-CM | POA: Diagnosis not present

## 2022-08-06 NOTE — Progress Notes (Signed)
Today the  Chief Complaint  Patient presents with   Foot Ulcer    Left foot toe ulcer, 3rd toe, follow-up, rate of pain 4 out of 10, "doing better" A1c- 6.7     Subjective:  72 y.o. male with PMHx of diabetes mellitus presenting today for follow-up evaluation of an ulcer to the left third toe.  Patient has been doing significantly better.  He applied antibiotic cream and a Band-Aid and he uses the toe spacer with significant improvement.  No new complaints   Past Medical History:  Diagnosis Date   Anxiety    Arthritis    Coronary artery disease    Diabetes mellitus without complication (HCC)    Hypertension    Hypertensive retinopathy    Myocardial infarction [facility])    Postoperative nausea 11/27/2019    Past Surgical History:  Procedure Laterality Date   CATARACT EXTRACTION     CHOLECYSTECTOMY     CORONARY ANGIOPLASTY WITH STENT PLACEMENT N/A 02/04/2006   EYE SURGERY     FOOT SURGERY Left    DOS 8.1.14 HALLIX IPJ FUSION, SECOND MET OSTEOTOMY W/SCREW, HAM TOE REPAIR 2,,4 , PARTIAL AMP 3RD DIGIT    GALLBLADDER SURGERY     KNEE SURERY Right    LEFT HEART CATH AND CORONARY ANGIOGRAPHY Left 11/17/2019   Procedure: LEFT HEART CATH AND CORONARY ANGIOGRAPHY;  Surgeon: Dalia Heading, MD;  Location: [facility] INVASIVE CV LAB;  Service: Cardiovascular;  Laterality: Left;   MYRINGOTOMY WITH TUBE PLACEMENT Right 10/25/2021   Procedure: MYRINGOTOMY WITH BUTTERFLY TUBE PLACEMENT;  Surgeon: Vernie Murders, MD;  Location: [facility];  Service: ENT;  Laterality: Right;   TOTAL KNEE ARTHROPLASTY Right 08/30/2014   Procedure: TOTAL KNEE ARTHROPLASTY;  Surgeon: Kennedy Bucker, MD;  Location: [facility] ORS;  Service: Orthopedics;  Laterality: Right;    Allergies  Allergen Reactions   Naproxen Swelling and Anaphylaxis    LT third toe 08/06/2022  Objective/Physical Exam General: The patient is alert and oriented x3 in no acute distress.  Dermatology:  Wound #1 noted to the left third toe with  significant improvement.  There is only partial thickness breakdown.  Please see above noted photo.  Vascular: Palpable pedal pulses bilaterally. Capillary refill within normal limits.  Clinically there is no concern for vascular compromise  Neurological: Diminished via light touch  Musculoskeletal Exam: History of prior toe amputations  Assessment: 1.  Ulcer left third toe secondary to diabetes mellitus 2. diabetes mellitus w/ peripheral neuropathy   Plan of Care:  -Patient was evaluated. - Light debridement of the area was performed today using a tissue nipper.  Overall significant improvement. -Continue mupirocin ointment with a Band-Aid -Continue silicone toe spacers -Continue to advise against going barefoot -Return to clinic 3 months for routine footcare   Felecia Shelling, DPM [facility]  Dr. Felecia Shelling, DPM    [address] [place], [place] [address]                Office [phone]  Fax [phone]

## 2022-08-14 ENCOUNTER — Encounter: Payer: Self-pay | Admitting: Podiatry

## 2022-08-14 ENCOUNTER — Ambulatory Visit: Payer: Medicare HMO | Admitting: Podiatry

## 2022-08-14 ENCOUNTER — Ambulatory Visit (INDEPENDENT_AMBULATORY_CARE_PROVIDER_SITE_OTHER): Payer: Medicare HMO

## 2022-08-14 VITALS — BP 155/77 | HR 56

## 2022-08-14 DIAGNOSIS — L97522 Non-pressure chronic ulcer of other part of left foot with fat layer exposed: Secondary | ICD-10-CM

## 2022-08-14 DIAGNOSIS — E0843 Diabetes mellitus due to underlying condition with diabetic autonomic (poly)neuropathy: Secondary | ICD-10-CM

## 2022-08-14 NOTE — Progress Notes (Signed)
No chief complaint on file.   Subjective:  72 y.o. male with PMHx of diabetes mellitus presenting today for follow-up evaluation of an ulcer to the left third toe.  Patient has been doing significantly better.  He applied antibiotic cream and a Band-Aid and he uses the toe spacer with significant improvement.  No new complaints   Past Medical History:  Diagnosis Date   Anxiety    Arthritis    Coronary artery disease    Diabetes mellitus without complication (HCC)    Hypertension    Hypertensive retinopathy    Myocardial infarction [facility])    Postoperative nausea 11/27/2019    Past Surgical History:  Procedure Laterality Date   CATARACT EXTRACTION     CHOLECYSTECTOMY     CORONARY ANGIOPLASTY WITH STENT PLACEMENT N/A 02/04/2006   EYE SURGERY     FOOT SURGERY Left    DOS 8.1.14 HALLIX IPJ FUSION, SECOND MET OSTEOTOMY W/SCREW, HAM TOE REPAIR 2,,4 , PARTIAL AMP 3RD DIGIT    GALLBLADDER SURGERY     KNEE SURERY Right    LEFT HEART CATH AND CORONARY ANGIOGRAPHY Left 11/17/2019   Procedure: LEFT HEART CATH AND CORONARY ANGIOGRAPHY;  Surgeon: Dalia Heading, MD;  Location: [facility] INVASIVE CV LAB;  Service: Cardiovascular;  Laterality: Left;   MYRINGOTOMY WITH TUBE PLACEMENT Right 10/25/2021   Procedure: MYRINGOTOMY WITH BUTTERFLY TUBE PLACEMENT;  Surgeon: Vernie Murders, MD;  [facility]: [facility];  Service: ENT;  Laterality: Right;   TOTAL KNEE ARTHROPLASTY Right 08/30/2014   Procedure: TOTAL KNEE ARTHROPLASTY;  Surgeon: Kennedy Bucker, MD;  Location: [facility] ORS;  Service: Orthopedics;  Laterality: Right;    Allergies  Allergen Reactions   Naproxen Swelling and Anaphylaxis    LT third toe 08/06/2022  Objective/Physical Exam General: The patient is alert and oriented x3 in no acute distress.  Dermatology:  Persistent wound #1 noted to the left third toe at the distal amputation stump.  Associated pain with palpation.  No purulence.  Vascular: Palpable pedal pulses bilaterally.  Capillary refill within normal limits.  Clinically there is no concern for vascular compromise  Neurological: Diminished via light touch  Musculoskeletal Exam: History of prior toe amputations Pain associated to the distal amputation stump of the left third toe  Radiographic exam LT foot 08/14/2022: No indication of osseous erosions or concern for underlying osteomyelitis.  There is significant contracture of the IPJ noted to the left third toe possibly contributing to increased pressure to the distal end of the middle phalanx.  Assessment: 1.  Ulcer left third toe secondary to diabetes mellitus with associated tenderness secondary to pressure 2. diabetes mellitus w/ peripheral neuropathy   Plan of Care:  -Patient was evaluated. - Light debridement of the area was performed today using a tissue nipper.  Overall significant improvement. -Continue mupirocin ointment with a Band-Aid -Continue silicone toe spacers -Continue to advise against going barefoot - Unfortunately the patient continues to have pain and tenderness associated to the distal tip of the third toe despite offloading techniques and shoes.  I do believe the patient would benefit from removing the middle phalanx which is contributing to the pressure to the distal tip of the toe.  This should also alleviate the ulcer and pain.  Risk benefits advantages and disadvantages were explained in detail to the patient.  No guarantees were expressed or implied.  The patient is familiar with amputation surgery and he consents and agrees will like to proceed with surgery. -Authorization for surgery was initiated today.  Surgery will consist of partial toe amputation left third toe -Return to clinic 1 week postop   Felecia Shelling, DPM [facility]  Dr. Felecia Shelling, DPM    [address] [place], [place] [address]                Office [phone]  Fax [phone]

## 2022-08-16 ENCOUNTER — Encounter: Payer: Self-pay | Admitting: Podiatry

## 2022-08-21 ENCOUNTER — Telehealth: Payer: Self-pay | Admitting: Podiatry

## 2022-08-21 ENCOUNTER — Encounter: Payer: Self-pay | Admitting: *Deleted

## 2022-08-21 NOTE — Telephone Encounter (Signed)
DOS 08/29/22  AMPUTATION TOE INTERPHALANGEAL - [address]  [employer] MEDICARE EFFECTIVE DATE - 02/04/2021  OOP - $3600 W/ 3260 REMAINING COINS 0%  SPOKE WITH MARK B AT [employer] MEDICARE. PER MARK CPT CODE 60454 DOES NOT REQUIRE PRIOR AUTHORIZATION.   REFERENCE NUMBER [id number]

## 2022-08-26 ENCOUNTER — Encounter: Payer: Self-pay | Admitting: *Deleted

## 2022-08-27 ENCOUNTER — Other Ambulatory Visit: Payer: Self-pay | Admitting: Family Medicine

## 2022-08-27 ENCOUNTER — Encounter
Admission: RE | Disposition: A | Discharge status: Home / Self Care | Payer: Self-pay | Source: Home / Self Care | Attending: Gastroenterology

## 2022-08-27 ENCOUNTER — Ambulatory Visit: Payer: Medicare HMO | Admitting: Anesthesiology

## 2022-08-27 ENCOUNTER — Encounter: Payer: Self-pay | Admitting: *Deleted

## 2022-08-27 ENCOUNTER — Other Ambulatory Visit: Payer: Self-pay

## 2022-08-27 ENCOUNTER — Ambulatory Visit
Admission: RE | Admit: 2022-08-27 | Discharge: 2022-08-27 | Disposition: A | Discharge status: Home / Self Care | Payer: Medicare HMO | Attending: Gastroenterology | Admitting: Gastroenterology

## 2022-08-27 DIAGNOSIS — R112 Nausea with vomiting, unspecified: Secondary | ICD-10-CM | POA: Diagnosis not present

## 2022-08-27 DIAGNOSIS — K529 Noninfective gastroenteritis and colitis, unspecified: Secondary | ICD-10-CM | POA: Diagnosis not present

## 2022-08-27 DIAGNOSIS — K6389 Other specified diseases of intestine: Secondary | ICD-10-CM | POA: Diagnosis not present

## 2022-08-27 DIAGNOSIS — E1169 Type 2 diabetes mellitus with other specified complication: Secondary | ICD-10-CM

## 2022-08-27 DIAGNOSIS — K635 Polyp of colon: Secondary | ICD-10-CM | POA: Diagnosis not present

## 2022-08-27 DIAGNOSIS — Z87891 Personal history of nicotine dependence: Secondary | ICD-10-CM | POA: Insufficient documentation

## 2022-08-27 DIAGNOSIS — K5730 Diverticulosis of large intestine without perforation or abscess without bleeding: Secondary | ICD-10-CM | POA: Insufficient documentation

## 2022-08-27 DIAGNOSIS — R634 Abnormal weight loss: Secondary | ICD-10-CM | POA: Diagnosis not present

## 2022-08-27 DIAGNOSIS — Z9049 Acquired absence of other specified parts of digestive tract: Secondary | ICD-10-CM | POA: Insufficient documentation

## 2022-08-27 DIAGNOSIS — K640 First degree hemorrhoids: Secondary | ICD-10-CM | POA: Insufficient documentation

## 2022-08-27 DIAGNOSIS — D120 Benign neoplasm of cecum: Secondary | ICD-10-CM | POA: Diagnosis not present

## 2022-08-27 DIAGNOSIS — D171 Benign lipomatous neoplasm of skin and subcutaneous tissue of trunk: Secondary | ICD-10-CM | POA: Diagnosis not present

## 2022-08-27 DIAGNOSIS — Z955 Presence of coronary angioplasty implant and graft: Secondary | ICD-10-CM | POA: Insufficient documentation

## 2022-08-27 DIAGNOSIS — K449 Diaphragmatic hernia without obstruction or gangrene: Secondary | ICD-10-CM | POA: Insufficient documentation

## 2022-08-27 DIAGNOSIS — Z79899 Other long term (current) drug therapy: Secondary | ICD-10-CM | POA: Insufficient documentation

## 2022-08-27 DIAGNOSIS — D123 Benign neoplasm of transverse colon: Secondary | ICD-10-CM | POA: Diagnosis not present

## 2022-08-27 DIAGNOSIS — I2510 Atherosclerotic heart disease of native coronary artery without angina pectoris: Secondary | ICD-10-CM | POA: Insufficient documentation

## 2022-08-27 DIAGNOSIS — D122 Benign neoplasm of ascending colon: Secondary | ICD-10-CM | POA: Insufficient documentation

## 2022-08-27 DIAGNOSIS — K3189 Other diseases of stomach and duodenum: Secondary | ICD-10-CM | POA: Diagnosis not present

## 2022-08-27 DIAGNOSIS — R1310 Dysphagia, unspecified: Secondary | ICD-10-CM | POA: Insufficient documentation

## 2022-08-27 DIAGNOSIS — I480 Paroxysmal atrial fibrillation: Secondary | ICD-10-CM | POA: Diagnosis not present

## 2022-08-27 DIAGNOSIS — I252 Old myocardial infarction: Secondary | ICD-10-CM | POA: Diagnosis not present

## 2022-08-27 DIAGNOSIS — Z6832 Body mass index (BMI) 32.0-32.9, adult: Secondary | ICD-10-CM | POA: Insufficient documentation

## 2022-08-27 DIAGNOSIS — E113399 Type 2 diabetes mellitus with moderate nonproliferative diabetic retinopathy without macular edema, unspecified eye: Secondary | ICD-10-CM | POA: Diagnosis not present

## 2022-08-27 DIAGNOSIS — K2960 Other gastritis without bleeding: Secondary | ICD-10-CM | POA: Diagnosis not present

## 2022-08-27 DIAGNOSIS — Z951 Presence of aortocoronary bypass graft: Secondary | ICD-10-CM | POA: Diagnosis not present

## 2022-08-27 DIAGNOSIS — I10 Essential (primary) hypertension: Secondary | ICD-10-CM

## 2022-08-27 DIAGNOSIS — Z96651 Presence of right artificial knee joint: Secondary | ICD-10-CM | POA: Diagnosis not present

## 2022-08-27 DIAGNOSIS — D175 Benign lipomatous neoplasm of intra-abdominal organs: Secondary | ICD-10-CM | POA: Diagnosis not present

## 2022-08-27 DIAGNOSIS — K2950 Unspecified chronic gastritis without bleeding: Secondary | ICD-10-CM | POA: Diagnosis not present

## 2022-08-27 DIAGNOSIS — K2970 Gastritis, unspecified, without bleeding: Secondary | ICD-10-CM | POA: Diagnosis not present

## 2022-08-27 HISTORY — PX: SUBMUCOSAL LIFTING INJECTION: SHX6855

## 2022-08-27 HISTORY — PX: COLONOSCOPY WITH PROPOFOL: SHX5780

## 2022-08-27 HISTORY — PX: HEMOSTASIS CLIP PLACEMENT: SHX6857

## 2022-08-27 HISTORY — PX: ESOPHAGOGASTRODUODENOSCOPY (EGD) WITH PROPOFOL: SHX5813

## 2022-08-27 HISTORY — PX: POLYPECTOMY: SHX5525

## 2022-08-27 HISTORY — PX: BIOPSY: SHX5522

## 2022-08-27 LAB — GLUCOSE, CAPILLARY: Glucose-Capillary: 156 mg/dL — ABNORMAL HIGH (ref 70–99)

## 2022-08-27 SURGERY — COLONOSCOPY WITH PROPOFOL
Anesthesia: General

## 2022-08-27 MED ORDER — SODIUM CHLORIDE 0.9 % IV SOLN: INTRAVENOUS | Status: DC

## 2022-08-27 MED ORDER — PROPOFOL 10 MG/ML IV BOLUS
INTRAVENOUS | Status: AC
  Filled 2022-08-27: qty 20

## 2022-08-27 MED ORDER — LIDOCAINE HCL (CARDIAC) PF 100 MG/5ML IV SOSY
PREFILLED_SYRINGE | INTRAVENOUS | Status: DC | PRN
  Administered 2022-08-27: 50 mg via INTRAVENOUS

## 2022-08-27 MED ORDER — DEXMEDETOMIDINE HCL IN NACL 80 MCG/20ML IV SOLN
INTRAVENOUS | Status: DC | PRN
  Administered 2022-08-27: 20 ug via INTRAVENOUS

## 2022-08-27 MED ORDER — PROPOFOL 10 MG/ML IV BOLUS
INTRAVENOUS | Status: DC | PRN
  Administered 2022-08-27: 20 mg via INTRAVENOUS
  Administered 2022-08-27: 100 mg via INTRAVENOUS

## 2022-08-27 MED ORDER — GLYCOPYRROLATE 0.2 MG/ML IJ SOLN
INTRAMUSCULAR | Status: AC
  Filled 2022-08-27: qty 1

## 2022-08-27 MED ORDER — LIDOCAINE HCL (PF) 2 % IJ SOLN
INTRAMUSCULAR | Status: AC
  Filled 2022-08-27: qty 5

## 2022-08-27 MED ORDER — GLYCOPYRROLATE 0.2 MG/ML IJ SOLN
INTRAMUSCULAR | Status: DC | PRN
  Administered 2022-08-27: .1 mg via INTRAVENOUS

## 2022-08-27 MED ORDER — PROPOFOL 500 MG/50ML IV EMUL
INTRAVENOUS | Status: DC | PRN
  Administered 2022-08-27: 150 ug/kg/min via INTRAVENOUS

## 2022-08-27 NOTE — Op Note (Signed)
[facility] Gastroenterology Patient Name: Kevin Perry Procedure Date: 08/27/2022 9:11 AM MRN: [id number] Account #: [id number] Date of Birth: [birth year] Admit Type: Outpatient Age: 72 Room: [facility] ENDO ROOM 1 Gender: Male Note Status: Finalized Instrument Name: Colonoscope [id number] Procedure:             Colonoscopy Indications:           Chronic diarrhea Providers:             Eather Colas MD, MD Referring MD:          Smitty Cords (Referring MD) Medicines:             Monitored Anesthesia Care Complications:         No immediate complications. Estimated blood loss:                         Minimal. Procedure:             Pre-Anesthesia Assessment:                        - Prior to the procedure, a History and Physical was                         performed, and patient medications and allergies were                         reviewed. The patient is competent. The risks and                         benefits of the procedure and the sedation options and                         risks were discussed with the patient. All questions                         were answered and informed consent was obtained.                         Patient identification and proposed procedure were                         verified by the physician, the nurse, the                         anesthesiologist, the anesthetist and the technician                         in the endoscopy suite. Mental Status Examination:                         alert and oriented. Airway Examination: normal                         oropharyngeal airway and neck mobility. Respiratory                         Examination: clear to auscultation. CV Examination:  normal. Prophylactic Antibiotics: The patient does not                         require prophylactic antibiotics. Prior                         Anticoagulants: The patient has taken no anticoagulant                         or  antiplatelet agents. ASA Grade Assessment: III - A                         patient with severe systemic disease. After reviewing                         the risks and benefits, the patient was deemed in                         satisfactory condition to undergo the procedure. The                         anesthesia plan was to use monitored anesthesia care                         (MAC). Immediately prior to administration of                         medications, the patient was re-assessed for adequacy                         to receive sedatives. The heart rate, respiratory                         rate, oxygen saturations, blood pressure, adequacy of                         pulmonary ventilation, and response to care were                         monitored throughout the procedure. The physical                         status of the patient was re-assessed after the                         procedure.                        After obtaining informed consent, the colonoscope was                         passed under direct vision. Throughout the procedure,                         the patient's blood pressure, pulse, and oxygen                         saturations were monitored continuously. The  Colonoscope was introduced through the anus and                         advanced to the the terminal ileum. The colonoscopy                         was somewhat difficult due to significant looping.                         Successful completion of the procedure was aided by                         applying abdominal pressure. The patient tolerated the                         procedure well. The quality of the bowel preparation                         was inadequate. The terminal ileum, ileocecal valve,                         appendiceal orifice, and rectum were photographed. Findings:      The perianal and digital rectal examinations were normal.      The terminal ileum appeared  normal.      A 5 mm polyp was found in the cecum. The polyp was sessile. The polyp       was removed with a cold snare. Resection and retrieval were complete.       Estimated blood loss was minimal.      There was a medium-sized lipoma, 15 mm in diameter, in the ascending       colon.      A 7 mm polyp was found in the ascending colon. The polyp was sessile.       The polyp was removed with a cold snare. The polyp was removed with a       piecemeal technique using a cold snare. Resection and retrieval were       complete. Estimated blood loss was minimal.      A 13 mm polyp was found in the mid ascending colon. The polyp was       sessile. The polyp was removed with a saline injection-lift technique       using a hot snare. Resection and retrieval were complete. To prevent       bleeding post-intervention, two hemostatic clips were successfully       placed. There was no bleeding during, or at the end, of the procedure.      A 7 mm polyp was found in the transverse colon. The polyp was sessile.       The polyp was removed with a cold snare. Resection and retrieval were       complete. Estimated blood loss was minimal.      Biopsies for histology were taken with a cold forceps from the entire       colon for evaluation of microscopic colitis.      Multiple large-mouthed and small-mouthed diverticula were found in the       sigmoid colon, descending colon and transverse colon.      Internal hemorrhoids were found during retroflexion. The hemorrhoids       were Grade  I (internal hemorrhoids that do not prolapse).      The exam was otherwise without abnormality on direct and retroflexion       views. Impression:            - Preparation of the colon was inadequate.                        - The examined portion of the ileum was normal.                        - One 5 mm polyp in the cecum, removed with a cold                         snare. Resected and retrieved.                        -  Medium-sized lipoma in the ascending colon.                        - One 7 mm polyp in the ascending colon, removed with                         a cold snare and removed piecemeal using a cold snare.                         Resected and retrieved.                        - One 13 mm polyp in the mid ascending colon, removed                         using injection-lift and a hot snare. Resected and                         retrieved. Clips were placed.                        - One 7 mm polyp in the transverse colon, removed with                         a cold snare. Resected and retrieved.                        - Diverticulosis in the sigmoid colon, in the                         descending colon and in the transverse colon.                        - Internal hemorrhoids.                        - The examination was otherwise normal on direct and                         retroflexion views.                        - Biopsies were taken  with a cold forceps from the                         entire colon for evaluation of microscopic colitis. Recommendation:        - Discharge patient to home.                        - Resume previous diet.                        - Continue present medications.                        - Await pathology results.                        - Repeat colonoscopy in 6-12 months because the bowel                         preparation was suboptimal.                        - Return to referring physician as previously                         scheduled. Procedure Code(s):     --- Professional ---                        [phone], Colonoscopy, flexible; with removal of                         tumor(s), polyp(s), or other lesion(s) by snare                         technique                        45380, 59, Colonoscopy, flexible; with biopsy, single                         or multiple                        45381, Colonoscopy, flexible; with directed submucosal                          injection(s), any substance Diagnosis Code(s):     --- Professional ---                        K64.0, First degree hemorrhoids                        D12.0, Benign neoplasm of cecum                        D12.2, Benign neoplasm of ascending colon                        D12.3, Benign neoplasm of transverse colon (hepatic                         flexure  or splenic flexure)                        D17.5, Benign lipomatous neoplasm of intra-abdominal                         organs                        K52.9, Noninfective gastroenteritis and colitis,                         unspecified                        K57.30, Diverticulosis of large intestine without                         perforation or abscess without bleeding CPT copyright 2022 American Medical Association. All rights reserved. The codes documented in this report are preliminary and upon coder review may  be revised to meet current compliance requirements. Eather Colas MD, MD 08/27/2022 10:17:26 AM Number of Addenda: 0 Note Initiated On: 08/27/2022 9:11 AM Scope Withdrawal Time: 0 hours 26 minutes 59 seconds  Total Procedure Duration: 0 hours 38 minutes 2 seconds  Estimated Blood Loss:  Estimated blood loss was minimal.      [facility]

## 2022-08-27 NOTE — Transfer of Care (Signed)
Immediate Anesthesia Transfer of Care Note  Patient: Deloy Archey [facility]  Procedure(s) Performed: COLONOSCOPY WITH PROPOFOL ESOPHAGOGASTRODUODENOSCOPY (EGD) WITH PROPOFOL BIOPSY POLYPECTOMY SUBMUCOSAL LIFTING INJECTION  Patient Location: PACU  Anesthesia Type:General  Level of Consciousness: drowsy, patient cooperative, and responds to stimulation  Airway & Oxygen Therapy: Patient Spontanous Breathing  Post-op Assessment: Report given to RN and Post -op Vital signs reviewed and stable  Post vital signs: Reviewed and stable  Last Vitals:  Vitals Value Taken Time  BP 99/61 08/27/22 1007  Temp 36.4 C 08/27/22 1006  Pulse 52 08/27/22 1008  Resp 20 08/27/22 1008  SpO2 95 % 08/27/22 1008  Vitals shown include unfiled device data.  Last Pain:  Vitals:   08/27/22 1006  TempSrc: Temporal  PainSc: 0-No pain         Complications: No notable events documented.

## 2022-08-27 NOTE — Interval H&P Note (Signed)
History and Physical Interval Note:  08/27/2022 9:04 AM  Kevin Perry  has presented today for surgery, with the diagnosis of nausea and vomiting diarrhea dysphagia weight loss.  The various methods of treatment have been discussed with the patient and family. After consideration of risks, benefits and other options for treatment, the patient has consented to  Procedure(s): COLONOSCOPY WITH PROPOFOL (N/A) ESOPHAGOGASTRODUODENOSCOPY (EGD) WITH PROPOFOL (N/A) as a surgical intervention.  The patient's history has been reviewed, patient examined, no change in status, stable for surgery.  I have reviewed the patient's chart and labs.  Questions were answered to the patient's satisfaction.     Regis Bill  Ok to proceed with EGD/Colonoscopy

## 2022-08-27 NOTE — Op Note (Signed)
[facility] Gastroenterology Patient Name: Kevin Perry Procedure Date: 08/27/2022 9:12 AM MRN: [id number] Account #: [id number] Date of Birth: 06-05-1950 Admit Type: Outpatient Age: 72 Room: [facility] ENDO ROOM 1 Gender: Male Note Status: Finalized Instrument Name: Upper Endoscope [id number] Procedure:             Upper GI endoscopy Indications:           Nausea with vomiting Providers:             Eather Colas MD, MD Referring MD:          Smitty Cords (Referring MD) Medicines:             Monitored Anesthesia Care Complications:         No immediate complications. Estimated blood loss:                         Minimal. Procedure:             Pre-Anesthesia Assessment:                        - Prior to the procedure, a History and Physical was                         performed, and patient medications and allergies were                         reviewed. The patient is competent. The risks and                         benefits of the procedure and the sedation options and                         risks were discussed with the patient. All questions                         were answered and informed consent was obtained.                         Patient identification and proposed procedure were                         verified by the physician, the nurse, the                         anesthesiologist, the anesthetist and the technician                         in the endoscopy suite. Mental Status Examination:                         alert and oriented. Airway Examination: normal                         oropharyngeal airway and neck mobility. Respiratory                         Examination: clear to auscultation. CV Examination:  normal. Prophylactic Antibiotics: The patient does not                         require prophylactic antibiotics. Prior                         Anticoagulants: The patient has taken no anticoagulant                          or antiplatelet agents. ASA Grade Assessment: III - A                         patient with severe systemic disease. After reviewing                         the risks and benefits, the patient was deemed in                         satisfactory condition to undergo the procedure. The                         anesthesia plan was to use monitored anesthesia care                         (MAC). Immediately prior to administration of                         medications, the patient was re-assessed for adequacy                         to receive sedatives. The heart rate, respiratory                         rate, oxygen saturations, blood pressure, adequacy of                         pulmonary ventilation, and response to care were                         monitored throughout the procedure. The physical                         status of the patient was re-assessed after the                         procedure.                        After obtaining informed consent, the endoscope was                         passed under direct vision. Throughout the procedure,                         the patient's blood pressure, pulse, and oxygen                         saturations were monitored continuously. The Endoscope  was introduced through the mouth, and advanced to the                         second part of duodenum. The upper GI endoscopy was                         accomplished without difficulty. The patient tolerated                         the procedure well. Findings:      The Z-line was irregular.      A small hiatal hernia was present.      The exam of the esophagus was otherwise normal.      Patchy mild inflammation characterized by erythema was found in the       gastric antrum. Biopsies were taken with a cold forceps for Helicobacter       pylori testing. Estimated blood loss was minimal.      The examined duodenum was normal. Impression:            - Z-line  irregular.                        - Small hiatal hernia.                        - Gastritis. Biopsied.                        - Normal examined duodenum. Recommendation:        - Discharge patient to home.                        - Resume previous diet.                        - Continue present medications.                        - Await pathology results.                        - Return to referring physician as previously                         scheduled. Procedure Code(s):     --- Professional ---                        [phone], Esophagogastroduodenoscopy, flexible,                         transoral; with biopsy, single or multiple Diagnosis Code(s):     --- Professional ---                        K22.89, Other specified disease of esophagus                        K44.9, Diaphragmatic hernia without obstruction or                         gangrene  K29.70, Gastritis, unspecified, without bleeding                        R11.2, Nausea with vomiting, unspecified CPT copyright 2022 American Medical Association. All rights reserved. The codes documented in this report are preliminary and upon coder review may  be revised to meet current compliance requirements. Eather Colas MD, MD 08/27/2022 10:07:43 AM Number of Addenda: 0 Note Initiated On: 08/27/2022 9:12 AM Estimated Blood Loss:  Estimated blood loss was minimal.      [facility]

## 2022-08-27 NOTE — H&P (Signed)
Outpatient short stay form Pre-procedure 08/27/2022  Regis Bill, MD  Primary Physician: Smitty Cords, DO  Reason for visit:  Nausea/Vomiting/Diarrhea  History of present illness:    72 y/o gentleman with history of hypertension, HLD, CAD, and DM II here for EGD/Colonoscopy for nausea/vomiting/diarrhea. He has never had these procedures before. No blood thinners except aspirin. History of cholecystectomy.   Current Facility-Administered Medications:    0.9 %  sodium chloride infusion, , Intravenous, Continuous, Purl Claytor, Rossie Muskrat, MD, Last Rate: 20 mL/hr at 08/27/22 0812, New Bag at 08/27/22 4098     Allergies  Allergen Reactions   Naproxen Swelling and Anaphylaxis     Past Medical History:  Diagnosis Date   Anxiety    Arthritis    Coronary artery disease    Diabetes mellitus without complication (HCC)    Hypertension    Hypertensive retinopathy    Myocardial infarction [facility])    Postoperative nausea 11/27/2019    Review of systems:  Otherwise negative.    Physical Exam  Gen: Alert, oriented. Appears stated age.  HEENT: PERRLA. Lungs: No respiratory distress CV: RRR Abd: soft, benign, no masses Ext: No edema    Planned procedures: Proceed with EGD/colonoscopy. The patient understands the nature of the planned procedure, indications, risks, alternatives and potential complications including but not limited to bleeding, infection, perforation, damage to internal organs and possible oversedation/side effects from anesthesia. The patient agrees and gives consent to proceed.  Please refer to procedure notes for findings, recommendations and patient disposition/instructions.     Regis Bill, MD [facility] Gastroenterology

## 2022-08-27 NOTE — Anesthesia Preprocedure Evaluation (Signed)
Anesthesia Evaluation  Patient identified by MRN, date of birth, ID band Patient awake    Reviewed: Allergy & Precautions, NPO status , Patient's Chart, lab work & pertinent test results, reviewed documented beta blocker date and time   History of Anesthesia Complications (+) PONV and history of anesthetic complications  Airway Mallampati: III  TM Distance: >3 FB Neck ROM: full    Dental  (+) Upper Dentures, Lower Dentures, Dental Advidsory Given   Pulmonary neg pulmonary ROS, former smoker   Pulmonary exam normal        Cardiovascular Exercise Tolerance: Good hypertension, Pt. on medications and Pt. on home beta blockers (-) angina + CAD, + Past MI and + CABG (S/P CABG x 4)  + dysrhythmias Atrial Fibrillation (-) Valvular Problems/Murmurs Rhythm:Regular Rate:Normal     Neuro/Psych  PSYCHIATRIC DISORDERS Anxiety Depression    Moderate non-proliferative diabetic retinopathy negative neurological ROS     GI/Hepatic negative GI ROS, Neg liver ROS,,,  Endo/Other  diabetes, Well Controlled, Type 2    Renal/GU      Musculoskeletal   Abdominal  (+) + obese  Peds  Hematology negative hematology ROS (+)   Anesthesia Other Findings RIGHT EUSTACHIAN TUBE DYSFUNCTION  Past Medical History: No date: Anxiety No date: Arthritis No date: Coronary artery disease No date: Diabetes mellitus without complication (HCC) No date: Hypertension No date: Hypertensive retinopathy No date: Myocardial infarction (HCC) 11/27/2019: Postoperative nausea  Past Surgical History: No date: CATARACT EXTRACTION No date: CHOLECYSTECTOMY 02/04/2006: CORONARY ANGIOPLASTY WITH STENT PLACEMENT; N/A No date: EYE SURGERY No date: FOOT SURGERY; Left     Comment:  DOS 8.1.14 HALLIX IPJ FUSION, SECOND MET OSTEOTOMY               W/SCREW, HAM TOE REPAIR 2,,4 , PARTIAL AMP 3RD DIGIT  No date: GALLBLADDER SURGERY No date: KNEE SURERY;  Right 11/17/2019: LEFT HEART CATH AND CORONARY ANGIOGRAPHY; Left     Comment:  Procedure: LEFT HEART CATH AND CORONARY ANGIOGRAPHY;                Surgeon: Dalia Heading, MD;  Location: [facility] INVASIVE CV              LAB;  Service: Cardiovascular;  Laterality: Left; 08/30/2014: TOTAL KNEE ARTHROPLASTY; Right     Comment:  Procedure: TOTAL KNEE ARTHROPLASTY;  Surgeon: Kennedy Bucker, MD;  Location: [facility] ORS;  Service: Orthopedics;                Laterality: Right;  BMI    Body Mass Index: 34.30 kg/m      Reproductive/Obstetrics negative OB ROS                             Anesthesia Physical Anesthesia Plan  ASA: 3  Anesthesia Plan: General   Post-op Pain Management: Minimal or no pain anticipated   Induction: Intravenous  PONV Risk Score and Plan: Treatment may vary due to age or medical condition, Propofol infusion and TIVA  Airway Management Planned: Natural Airway and Mask  Additional Equipment:   Intra-op Plan:   Post-operative Plan:   Informed Consent: I have reviewed the patients History and Physical, chart, labs and discussed the procedure including the risks, benefits and alternatives for the proposed anesthesia with the patient or authorized representative who has indicated his/her understanding and acceptance.  Dental Advisory Given  Plan Discussed with: Anesthesiologist, CRNA and Surgeon  Anesthesia Plan Comments:         Anesthesia Quick Evaluation

## 2022-08-28 ENCOUNTER — Encounter: Payer: Self-pay | Admitting: Gastroenterology

## 2022-08-28 NOTE — Telephone Encounter (Signed)
Requested Prescriptions  Pending Prescriptions Disp Refills   metFORMIN (GLUCOPHAGE) 500 MG tablet [Pharmacy Med Name: METFORMIN HCL 500 MG TAB] 270 tablet     Sig: TAKE 2 TABLETS BY MOUTH ONCE EVERY MORNING WITH BREAKFAST AND 1 TABLET ONCEEVERY EVENING WITH DINNER     Endocrinology:  Diabetes - Biguanides Failed - 08/27/2022  4:06 PM      Failed - eGFR in normal range and within 360 days    GFR, Est African American  Date Value Ref Range Status  03/02/2020 92 > OR = 60 mL/min/1.13m2 Final   GFR, Est Non African American  Date Value Ref Range Status  03/02/2020 79 > OR = 60 mL/min/1.28m2 Final   eGFR  Date Value Ref Range Status  02/22/2021 93 > OR = 60 mL/min/1.106m2 Final    Comment:    The eGFR is based on the CKD-EPI 2021 equation. To calculate  the new eGFR from a previous Creatinine or Cystatin C result, go to https://www.kidney.org/professionals/ kdoqi/gfr%5Fcalculator          Failed - B12 Level in normal range and within 720 days    No results found for: "VITAMINB12"       Failed - CBC within normal limits and completed in the last 12 months    WBC  Date Value Ref Range Status  02/22/2021 6.3 3.8 - 10.8 Thousand/uL Final   RBC  Date Value Ref Range Status  02/22/2021 4.61 4.20 - 5.80 Million/uL Final   Hemoglobin  Date Value Ref Range Status  02/22/2021 15.2 13.2 - 17.1 g/dL Final   HGB  Date Value Ref Range Status  06/20/2011 14.9 13.0 - 18.0 g/dL Final   HCT  Date Value Ref Range Status  02/22/2021 43.7 38.5 - 50.0 % Final  06/20/2011 42.8 40.0 - 52.0 % Final   MCHC  Date Value Ref Range Status  02/22/2021 34.8 32.0 - 36.0 g/dL Final   [facility]  Date Value Ref Range Status  02/22/2021 33.0 27.0 - 33.0 pg Final   MCV  Date Value Ref Range Status  02/22/2021 94.8 80.0 - 100.0 fL Final  06/20/2011 93 80 - 100 fL Final   No results found for: "PLTCOUNTKUC", "LABPLAT", "POCPLA" RDW  Date Value Ref Range Status  02/22/2021 13.2 11.0 - 15.0 % Final   06/20/2011 13.2 11.5 - 14.5 % Final         Passed - Cr in normal range and within 360 days    Creat  Date Value Ref Range Status  02/22/2021 0.88 0.70 - 1.28 mg/dL Final   Creatinine, Ser  Date Value Ref Range Status  05/30/2022 1.10 0.61 - 1.24 mg/dL Final   Creatinine, POC  Date Value Ref Range Status  03/09/2015 0 mg/dL Final   Creatinine, Urine  Date Value Ref Range Status  07/15/2022 99 20 - 320 mg/dL Final         Passed - HBA1C is between 0 and 7.9 and within 180 days    Hemoglobin A1C  Date Value Ref Range Status  07/15/2022 6.8 (A) 4.0 - 5.6 % Final  07/27/2012 7.6 (H) 4.2 - 6.3 % Final    Comment:    The American Diabetes Association recommends that a primary goal of therapy should be <7% and that physicians should reevaluate the treatment regimen in patients with HbA1c values consistently >8%.    Hgb A1c MFr Bld  Date Value Ref Range Status  02/22/2021 7.3 (H) <5.7 % of total Hgb Final  Comment:    For someone without known diabetes, a hemoglobin A1c value of 6.5% or greater indicates that they may have  diabetes and this should be confirmed with a follow-up  test. . For someone with known diabetes, a value <7% indicates  that their diabetes is well controlled and a value  greater than or equal to 7% indicates suboptimal  control. A1c targets should be individualized based on  duration of diabetes, age, comorbid conditions, and  other considerations. . Currently, no consensus exists regarding use of hemoglobin A1c for diagnosis of diabetes for children. Verna Czech - Valid encounter within last 6 months    Recent Outpatient Visits           1 month ago Type 2 diabetes mellitus with other specified complication, without long-term current use of insulin [facility])   [place] [facility] [place], Netta Neat, DO   1 month ago Type 2 diabetes mellitus with other specified complication, without long-term current use of  insulin (HCC)   [place] [facility] Delles, Gentry Fitz A, RPH-CPP   5 months ago Type 2 diabetes mellitus with other specified complication, without long-term current use of insulin [facility])   [place] [facility] Delles, Gentry Fitz A, RPH-CPP   6 months ago Acute cough   [place] [facility] Smitty Cords, DO   8 months ago Type 2 diabetes mellitus with other specified complication, without long-term current use of insulin (HCC)   [place] [facility] Delles, Jackelyn Poling, RPH-CPP       Future Appointments             In 4 months Althea Charon, Netta Neat, DO [place] [facility]             metoprolol tartrate (LOPRESSOR) 25 MG tablet [Pharmacy Med Name: METOPROLOL TARTRATE 25 MG TAB] 180 tablet 0    Sig: TAKE 1 TABLET BY MOUTH TWICE DAILY     Cardiovascular:  Beta Blockers Passed - 08/27/2022  4:06 PM      Passed - Last BP in normal range    BP Readings from Last 1 Encounters:  08/27/22 138/87         Passed - Last Heart Rate in normal range    Pulse Readings from Last 1 Encounters:  08/27/22 (!) 45         Passed - Valid encounter within last 6 months    Recent Outpatient Visits           1 month ago Type 2 diabetes mellitus with other specified complication, without long-term current use of insulin (HCC)   [place] [facility] [place], Netta Neat, DO   1 month ago Type 2 diabetes mellitus with other specified complication, without long-term current use of insulin (HCC)   [place] [facility] Delles, Gentry Fitz A, RPH-CPP   5 months ago Type 2 diabetes mellitus with other specified complication, without long-term current use of insulin [facility])   [place] [facility] Delles, Gentry Fitz A, RPH-CPP   6 months ago Acute cough   [place] [facility] [place], Netta Neat, DO   8 months ago  Type 2 diabetes mellitus with other specified complication, without long-term current use of insulin [facility])    [facility] Delles, Gentry Fitz A, RPH-CPP  Future Appointments             In 4 months Karamalegos, Netta Neat, DO [place] [facility], [place]

## 2022-08-28 NOTE — Anesthesia Postprocedure Evaluation (Signed)
Anesthesia Post Note  Patient: Kevin Perry  Procedure(s) Performed: COLONOSCOPY WITH PROPOFOL ESOPHAGOGASTRODUODENOSCOPY (EGD) WITH PROPOFOL BIOPSY POLYPECTOMY SUBMUCOSAL LIFTING INJECTION HEMOSTASIS CLIP PLACEMENT  Patient location during evaluation: Endoscopy Anesthesia Type: General Level of consciousness: awake and alert Pain management: pain level controlled Vital Signs Assessment: post-procedure vital signs reviewed and stable Respiratory status: spontaneous breathing, nonlabored ventilation, respiratory function stable and patient connected to nasal cannula oxygen Cardiovascular status: blood pressure returned to baseline and stable Postop Assessment: no apparent nausea or vomiting Anesthetic complications: no   No notable events documented.   Last Vitals:  Vitals:   08/27/22 1006 08/27/22 1026  BP: 99/61 138/87  Pulse: (!) 45   Resp: 14   Temp: (!) 36.4 C   SpO2: 96%     Last Pain:  Vitals:   08/28/22 0743  TempSrc:   PainSc: 0-No pain                 Lenard Simmer

## 2022-08-28 NOTE — Telephone Encounter (Signed)
Requested medication (s) are due for refill today:yes  Requested medication (s) are on the active medication list: yes  Last refill:  # 270 05/23/22  Future visit scheduled: yes  Notes to clinic:  overdue lab work    Requested Prescriptions  Pending Prescriptions Disp Refills   metFORMIN (GLUCOPHAGE) 500 MG tablet [Pharmacy Med Name: METFORMIN HCL 500 MG TAB] 270 tablet     Sig: TAKE 2 TABLETS BY MOUTH ONCE EVERY MORNING WITH BREAKFAST AND 1 TABLET ONCEEVERY EVENING WITH DINNER     Endocrinology:  Diabetes - Biguanides Failed - 08/27/2022  4:06 PM      Failed - eGFR in normal range and within 360 days    GFR, Est African American  Date Value Ref Range Status  03/02/2020 92 > OR = 60 mL/min/1.14m2 Final   GFR, Est Non African American  Date Value Ref Range Status  03/02/2020 79 > OR = 60 mL/min/1.80m2 Final   eGFR  Date Value Ref Range Status  02/22/2021 93 > OR = 60 mL/min/1.59m2 Final    Comment:    The eGFR is based on the CKD-EPI 2021 equation. To calculate  the new eGFR from a previous Creatinine or Cystatin C result, go to https://www.kidney.org/professionals/ kdoqi/gfr%5Fcalculator          Failed - B12 Level in normal range and within 720 days    No results found for: "VITAMINB12"       Failed - CBC within normal limits and completed in the last 12 months    WBC  Date Value Ref Range Status  02/22/2021 6.3 3.8 - 10.8 Thousand/uL Final   RBC  Date Value Ref Range Status  02/22/2021 4.61 4.20 - 5.80 Million/uL Final   Hemoglobin  Date Value Ref Range Status  02/22/2021 15.2 13.2 - 17.1 g/dL Final   HGB  Date Value Ref Range Status  06/20/2011 14.9 13.0 - 18.0 g/dL Final   HCT  Date Value Ref Range Status  02/22/2021 43.7 38.5 - 50.0 % Final  06/20/2011 42.8 40.0 - 52.0 % Final   MCHC  Date Value Ref Range Status  02/22/2021 34.8 32.0 - 36.0 g/dL Final   [facility]  Date Value Ref Range Status  02/22/2021 33.0 27.0 - 33.0 pg Final   MCV  Date Value  Ref Range Status  02/22/2021 94.8 80.0 - 100.0 fL Final  06/20/2011 93 80 - 100 fL Final   No results found for: "PLTCOUNTKUC", "LABPLAT", "POCPLA" RDW  Date Value Ref Range Status  02/22/2021 13.2 11.0 - 15.0 % Final  06/20/2011 13.2 11.5 - 14.5 % Final         Passed - Cr in normal range and within 360 days    Creat  Date Value Ref Range Status  02/22/2021 0.88 0.70 - 1.28 mg/dL Final   Creatinine, Ser  Date Value Ref Range Status  05/30/2022 1.10 0.61 - 1.24 mg/dL Final   Creatinine, POC  Date Value Ref Range Status  03/09/2015 0 mg/dL Final   Creatinine, Urine  Date Value Ref Range Status  07/15/2022 99 20 - 320 mg/dL Final         Passed - HBA1C is between 0 and 7.9 and within 180 days    Hemoglobin A1C  Date Value Ref Range Status  07/15/2022 6.8 (A) 4.0 - 5.6 % Final  07/27/2012 7.6 (H) 4.2 - 6.3 % Final    Comment:    The American Diabetes Association recommends that a primary goal of  therapy should be <7% and that physicians should reevaluate the treatment regimen in patients with HbA1c values consistently >8%.    Hgb A1c MFr Bld  Date Value Ref Range Status  02/22/2021 7.3 (H) <5.7 % of total Hgb Final    Comment:    For someone without known diabetes, a hemoglobin A1c value of 6.5% or greater indicates that they may have  diabetes and this should be confirmed with a follow-up  test. . For someone with known diabetes, a value <7% indicates  that their diabetes is well controlled and a value  greater than or equal to 7% indicates suboptimal  control. A1c targets should be individualized based on  duration of diabetes, age, comorbid conditions, and  other considerations. . Currently, no consensus exists regarding use of hemoglobin A1c for diagnosis of diabetes for children. Verna Czech - Valid encounter within last 6 months    Recent Outpatient Visits           1 month ago Type 2 diabetes mellitus with other specified complication,  without long-term current use of insulin [facility]   [place] [facility] [place], Netta Neat, DO   1 month ago Type 2 diabetes mellitus with other specified complication, without long-term current use of insulin (HCC)   [place] [facility] Delles, Gentry Fitz A, RPH-CPP   5 months ago Type 2 diabetes mellitus with other specified complication, without long-term current use of insulin [facility])   [place] [facility] Delles, Gentry Fitz A, RPH-CPP   6 months ago Acute cough   [place] [facility] Smitty Cords, DO   8 months ago Type 2 diabetes mellitus with other specified complication, without long-term current use of insulin (HCC)   [facility] Delles, Jackelyn Poling, RPH-CPP       Future Appointments             In 4 months Althea Charon, Netta Neat, DO [place] [facility], [place]            Signed Prescriptions Disp Refills   metoprolol tartrate (LOPRESSOR) 25 MG tablet 180 tablet 0    Sig: TAKE 1 TABLET BY MOUTH TWICE DAILY     Cardiovascular:  Beta Blockers Passed - 08/27/2022  4:06 PM      Passed - Last BP in normal range    BP Readings from Last 1 Encounters:  08/27/22 138/87         Passed - Last Heart Rate in normal range    Pulse Readings from Last 1 Encounters:  08/27/22 (!) 45         Passed - Valid encounter within last 6 months    Recent Outpatient Visits           1 month ago Type 2 diabetes mellitus with other specified complication, without long-term current use of insulin (HCC)   [place] [facility] [place], Netta Neat, DO   1 month ago Type 2 diabetes mellitus with other specified complication, without long-term current use of insulin [facility])   [facility] Delles, Gentry Fitz A, RPH-CPP   5 months ago Type 2 diabetes mellitus with other specified complication, without long-term  current use of insulin [facility])   [place] [facility] Delles, Gentry Fitz A, RPH-CPP   6 months ago Acute cough   [place]  [facility] [place], Netta Neat, DO   8 months ago Type 2 diabetes mellitus with other specified complication, without long-term current use of insulin [facility])   [place] [facility] Delles, Jackelyn Poling, RPH-CPP       Future Appointments             In 4 months Althea Charon, Netta Neat, DO [place] [facility], [facility]

## 2022-08-29 ENCOUNTER — Other Ambulatory Visit: Payer: Self-pay | Admitting: Podiatry

## 2022-08-29 ENCOUNTER — Ambulatory Visit (INDEPENDENT_AMBULATORY_CARE_PROVIDER_SITE_OTHER): Payer: Medicare HMO | Admitting: Podiatry

## 2022-08-29 DIAGNOSIS — L97522 Non-pressure chronic ulcer of other part of left foot with fat layer exposed: Secondary | ICD-10-CM | POA: Diagnosis not present

## 2022-08-29 DIAGNOSIS — M869 Osteomyelitis, unspecified: Secondary | ICD-10-CM | POA: Diagnosis not present

## 2022-08-29 DIAGNOSIS — Z89421 Acquired absence of other right toe(s): Secondary | ICD-10-CM

## 2022-08-29 DIAGNOSIS — M86472 Chronic osteomyelitis with draining sinus, left ankle and foot: Secondary | ICD-10-CM | POA: Diagnosis not present

## 2022-08-29 MED ORDER — OXYCODONE-ACETAMINOPHEN 5-325 MG PO TABS: 1.0000 | ORAL_TABLET | ORAL | 0 refills | Status: DC | PRN

## 2022-08-29 MED ORDER — DOXYCYCLINE HYCLATE 100 MG PO TABS: 100.0000 mg | ORAL_TABLET | Freq: Two times a day (BID) | ORAL | 0 refills | Status: DC

## 2022-08-29 NOTE — Progress Notes (Signed)
PRN postop 

## 2022-08-29 NOTE — Progress Notes (Signed)
  Subjective:  Patient ID: Kevin Perry, male    DOB: 05-01-1950,  MRN: [id number]  Chief Complaint  Patient presents with   Post-op Problem    pt had surgery today and cannot get the bleeding to stop     72 y.o. male returns for post-op check.  Patient has surgery today and was bleeding.  Patient has an amputation of the right third toe on 6 02/10/2020 through 4.  Done by Dr. Logan Bores.  Review of Systems: Negative except as noted in the HPI. Denies N/V/F/Ch.  Past Medical History:  Diagnosis Date   Anxiety    Arthritis    Coronary artery disease    Diabetes mellitus without complication (HCC)    Hypertension    Hypertensive retinopathy    Myocardial infarction [facility])    Postoperative nausea 11/27/2019     Social History   Tobacco Use  Smoking Status Former   Current packs/day: 0.00   Types: Cigarettes   Quit date: 08/16/2004   Years since quitting: 18.0  Smokeless Tobacco Former   Quit date: 2006  Tobacco Comments   quit 20 plus years    Allergies  Allergen Reactions   Naproxen Swelling and Anaphylaxis   Objective:  There were no vitals filed for this visit. There is no height or weight on file to calculate BMI. Constitutional Well developed. Well nourished.  Vascular Foot warm and well perfused. Capillary refill normal to all digits.   Neurologic Normal speech. Oriented to person, place, and time. Epicritic sensation to light touch grossly present bilaterally.  Dermatologic Skin healing well without signs of infection. Skin edges well coapted without signs of infection.  Orthopedic: Tenderness to palpation noted about the surgical site.   Radiographs: None Assessment:   1. History of amputation of lesser toe of right foot (HCC)    Plan:  Patient was evaluated and treated and all questions answered.  S/p foot surgery right -Patient had an active bleeder which was ligated using 3-0 Monocryl.  The wound was also primarily closed there was superficial  dehiscence.  No acute complaints.  No follow-ups on file.

## 2022-09-04 ENCOUNTER — Encounter: Payer: Medicare HMO | Admitting: Podiatry

## 2022-09-05 ENCOUNTER — Ambulatory Visit: Payer: Medicare HMO

## 2022-09-05 ENCOUNTER — Ambulatory Visit (INDEPENDENT_AMBULATORY_CARE_PROVIDER_SITE_OTHER): Payer: Medicare HMO | Admitting: Podiatry

## 2022-09-05 DIAGNOSIS — Z89422 Acquired absence of other left toe(s): Secondary | ICD-10-CM

## 2022-09-05 NOTE — Progress Notes (Signed)
  Subjective:  Patient ID: Kevin Perry, male    DOB: 11/26/1950,  MRN: [id number]  Chief Complaint  Patient presents with   Routine Post Op    POV #1 DOS 08/29/22 PARTIAL TOE AMPUTATION THIRD LEFT. (Dr Logan Bores Patient)    DOS: 08/29/2022 Procedure: Left partial third toe amputation  72 y.o. male returns for post-op check.  Patient states that he is doing well denies any other acute complaints.  Incision is clean dry and intact.  Review of Systems: Negative except as noted in the HPI. Denies N/V/F/Ch.  Past Medical History:  Diagnosis Date   Anxiety    Arthritis    Coronary artery disease    Diabetes mellitus without complication (HCC)    Hypertension    Hypertensive retinopathy    Myocardial infarction [facility])    Postoperative nausea 11/27/2019     Social History   Tobacco Use  Smoking Status Former   Current packs/day: 0.00   Types: Cigarettes   Quit date: 08/16/2004   Years since quitting: 18.0  Smokeless Tobacco Former   Quit date: 2006  Tobacco Comments   quit 20 plus years    Allergies  Allergen Reactions   Naproxen Swelling and Anaphylaxis   Objective:  There were no vitals filed for this visit. There is no height or weight on file to calculate BMI. Constitutional Well developed. Well nourished.  Vascular Foot warm and well perfused. Capillary refill normal to all digits.   Neurologic Normal speech. Oriented to person, place, and time. Epicritic sensation to light touch grossly present bilaterally.  Dermatologic Skin healing well without signs of infection. Skin edges well coapted without signs of infection.  Orthopedic: Tenderness to palpation noted about the surgical site.   Radiographs: 3 views of skeletally mature left foot: Amputation of the third digit noted at the metatarsophalangeal joint. Assessment:   1. Status post surgery   2. Toe ulcer, left, with fat layer exposed (HCC)    Plan:  Patient was evaluated and treated and all questions  answered.  S/p foot surgery left -Progressing as expected post-operatively. -XR: See above -WB Status: Weightbearing as tolerated in surgical shoe -Sutures: Intact.  No clinical signs of Deis is noted no complication noted.  Sutures are not quite ready to come out. -Medications: None -Foot redressed.  No follow-ups on file.

## 2022-09-10 ENCOUNTER — Ambulatory Visit (INDEPENDENT_AMBULATORY_CARE_PROVIDER_SITE_OTHER): Payer: Medicare HMO | Admitting: Podiatry

## 2022-09-10 ENCOUNTER — Encounter: Payer: Self-pay | Admitting: Podiatry

## 2022-09-10 DIAGNOSIS — Z89422 Acquired absence of other left toe(s): Secondary | ICD-10-CM | POA: Diagnosis not present

## 2022-09-10 NOTE — Progress Notes (Signed)
Chief Complaint  Patient presents with   Routine Post Op    "It's doing good, it don't hurt a bit."    Subjective:  Patient presents today status post left third toe amputation.  DOS: 08/29/2022.  Patient doing well.  He has no pain and so he states that he has been ambulating excessively and basically has been full activity with no restrictions weightbearing in the postoperative shoe.  No new complaints  Past Medical History:  Diagnosis Date   Anxiety    Arthritis    Coronary artery disease    Diabetes mellitus without complication (HCC)    Hypertension    Hypertensive retinopathy    Myocardial infarction [facility])    Postoperative nausea 11/27/2019    Past Surgical History:  Procedure Laterality Date   BIOPSY  08/27/2022   Procedure: BIOPSY;  Surgeon: Regis Bill, MD;  Location: [facility] ENDOSCOPY;  Service: Endoscopy;;   CATARACT EXTRACTION     CHOLECYSTECTOMY     COLONOSCOPY WITH PROPOFOL N/A 08/27/2022   Procedure: COLONOSCOPY WITH PROPOFOL;  Surgeon: Regis Bill, MD;  Location: [facility] ENDOSCOPY;  Service: Endoscopy;  Laterality: N/A;   CORONARY ANGIOPLASTY WITH STENT PLACEMENT N/A 02/04/2006   ESOPHAGOGASTRODUODENOSCOPY (EGD) WITH PROPOFOL N/A 08/27/2022   Procedure: ESOPHAGOGASTRODUODENOSCOPY (EGD) WITH PROPOFOL;  Surgeon: Regis Bill, MD;  Location: [facility] ENDOSCOPY;  Service: Endoscopy;  Laterality: N/A;   EYE SURGERY     FOOT SURGERY Left    DOS 8.1.14 HALLIX IPJ FUSION, SECOND MET OSTEOTOMY W/SCREW, HAM TOE REPAIR 2,,4 , PARTIAL AMP 3RD DIGIT    GALLBLADDER SURGERY     HEMOSTASIS CLIP PLACEMENT  08/27/2022   Procedure: HEMOSTASIS CLIP PLACEMENT;  Surgeon: Regis Bill, MD;  Location: [facility] ENDOSCOPY;  Service: Endoscopy;;   KNEE SURERY Right    LEFT HEART CATH AND CORONARY ANGIOGRAPHY Left 11/17/2019   Procedure: LEFT HEART CATH AND CORONARY ANGIOGRAPHY;  Surgeon: Dalia Heading, MD;  Location: [facility] INVASIVE CV LAB;  Service: Cardiovascular;   Laterality: Left;   MYRINGOTOMY WITH TUBE PLACEMENT Right 10/25/2021   Procedure: MYRINGOTOMY WITH BUTTERFLY TUBE PLACEMENT;  Surgeon: Vernie Murders, MD;  Location[facility];  Service: ENT;  Laterality: Right;   POLYPECTOMY  08/27/2022   Procedure: POLYPECTOMY;  Surgeon: Regis Bill, MD;  Location: [facility] ENDOSCOPY;  Service: Endoscopy;;   SUBMUCOSAL LIFTING INJECTION  08/27/2022   Procedure: SUBMUCOSAL LIFTING INJECTION;  Surgeon: Regis Bill, MD;  Location: [facility] ENDOSCOPY;  Service: Endoscopy;;  ELEVIEW ASCENDING COLON POLYP   TOTAL KNEE ARTHROPLASTY Right 08/30/2014   Procedure: TOTAL KNEE ARTHROPLASTY;  Surgeon: Kennedy Bucker, MD;  Location: [facility] ORS;  Service: Orthopedics;  Laterality: Right;    Allergies  Allergen Reactions   Naproxen Swelling and Anaphylaxis    Objective/Physical Exam Neurovascular status intact.  Incision well coapted with sutures intact.  No infection or erythema or purulence.  Heavy eschar noted over the incision site.  It does appear that the incision site is slightly trying to open due to the excessive ambulation.  Radiographic Exam LT foot 09/05/2022:  Absence of the third toe noted at the level of the MTP.  Orthopedic screw noted within the great toe joint which appear stable.  No gas within the tissue.  There is also some radiolucency within the base of the proximal phalanx and metatarsal head of the fourth ray.  Will observe for now  Assessment: 1. s/p left third toe amputation. DOS: 08/29/2022   Plan of Care:  -Patient  was evaluated. X-rays reviewed - Dressings changed.  Recommend Betadine wet-to-dry dressings daily.  Supplies provided -Stressed the importance of reducing activity.  Minimal WBAT surgical shoe -Return to clinic 1 week   Felecia Shelling, DPM [facility]  Dr. Felecia Shelling, DPM    [address] [place], [place] [address]                Office [phone]  Fax  [phone]

## 2022-09-11 ENCOUNTER — Encounter: Payer: Medicare HMO | Admitting: Podiatry

## 2022-09-17 ENCOUNTER — Other Ambulatory Visit: Payer: Self-pay | Admitting: Family Medicine

## 2022-09-17 ENCOUNTER — Telehealth: Payer: Self-pay | Admitting: Family Medicine

## 2022-09-17 DIAGNOSIS — I10 Essential (primary) hypertension: Secondary | ICD-10-CM

## 2022-09-17 NOTE — Telephone Encounter (Signed)
LM 09/17/2022 to schedule AWV   Verlee Rossetti; Care Guide Ambulatory Clinical Support [facility] Direct Dial: [phone]

## 2022-09-20 ENCOUNTER — Other Ambulatory Visit: Payer: Self-pay | Admitting: Podiatry

## 2022-09-20 MED ORDER — DOXYCYCLINE HYCLATE 100 MG PO TABS: 100.0000 mg | ORAL_TABLET | Freq: Two times a day (BID) | ORAL | 0 refills | Status: DC

## 2022-09-24 ENCOUNTER — Encounter: Payer: Self-pay | Admitting: Podiatry

## 2022-09-24 ENCOUNTER — Ambulatory Visit (INDEPENDENT_AMBULATORY_CARE_PROVIDER_SITE_OTHER): Payer: Medicare HMO | Admitting: Podiatry

## 2022-09-24 DIAGNOSIS — Z89422 Acquired absence of other left toe(s): Secondary | ICD-10-CM | POA: Diagnosis not present

## 2022-09-24 NOTE — Progress Notes (Signed)
Chief Complaint  Patient presents with   Routine Post Op    POV #3 DOS 08/29/22 PARTIAL TOE AMPUTATION LEFT THIRD "It's doing good.  It don't hurt.  I'm ready to get these stitches out."    Subjective:  Patient presents today status post left third toe amputation.  DOS: 08/29/2022.  Patient continues to do well.  No pain.  Overall he is very satisfied believes he is healing nicely  Past Medical History:  Diagnosis Date   Anxiety    Arthritis    Coronary artery disease    Diabetes mellitus without complication (HCC)    Hypertension    Hypertensive retinopathy    Myocardial infarction [facility])    Postoperative nausea 11/27/2019    Past Surgical History:  Procedure Laterality Date   BIOPSY  08/27/2022   Procedure: BIOPSY;  Surgeon: Regis Bill, MD;  Location: [facility] ENDOSCOPY;  Service: Endoscopy;;   CATARACT EXTRACTION     CHOLECYSTECTOMY     COLONOSCOPY WITH PROPOFOL N/A 08/27/2022   Procedure: COLONOSCOPY WITH PROPOFOL;  Surgeon: Regis Bill, MD;  Location: [facility] ENDOSCOPY;  Service: Endoscopy;  Laterality: N/A;   CORONARY ANGIOPLASTY WITH STENT PLACEMENT N/A 02/04/2006   ESOPHAGOGASTRODUODENOSCOPY (EGD) WITH PROPOFOL N/A 08/27/2022   Procedure: ESOPHAGOGASTRODUODENOSCOPY (EGD) WITH PROPOFOL;  Surgeon: Regis Bill, MD;  Location: [facility] ENDOSCOPY;  Service: Endoscopy;  Laterality: N/A;   EYE SURGERY     FOOT SURGERY Left    DOS 8.1.14 HALLIX IPJ FUSION, SECOND MET OSTEOTOMY W/SCREW, HAM TOE REPAIR 2,,4 , PARTIAL AMP 3RD DIGIT    GALLBLADDER SURGERY     HEMOSTASIS CLIP PLACEMENT  08/27/2022   Procedure: HEMOSTASIS CLIP PLACEMENT;  Surgeon: Regis Bill, MD;  Location: [facility]OS[facility];  Service: Endoscopy;;   KNEE SURERY Right    LEFT HEART CATH AND CORONARY ANGIOGRAPHY Left 11/17/2019   Procedure: LEFT HEART CATH AND CORONARY ANGIOGRAPHY;  Surgeon: Dalia Heading, MD;  Location: [facility];  Service: Cardiovascular;  Laterality: Left;    MYRINGOTOMY WITH TUBE PLACEMENT Right 10/25/2021   Procedure: MYRINGOTOMY WITH BUTTERFLY TUBE PLACEMENT;  Surgeon: Vernie Murders, MD;  Location[facility];  Service: ENT;  Laterality: Right;   POLYPECTOMY  08/27/2022   Procedure: POLYPECTOMY;  Surgeon: Regis Bill, MD;  Location: [facility];  Service: Endoscopy;;   SUBMUCOSAL LIFTING INJECTION  08/27/2022   Procedure: SUBMUCOSAL LIFTING INJECTION;  Surgeon: Regis Bill, MD;  Location: [facility];  Service: Endoscopy;;  ELEVIEW ASCENDING COLON POLYP   TOTAL KNEE ARTHROPLASTY Right 08/30/2014   Procedure: TOTAL KNEE ARTHROPLASTY;  Surgeon: Kennedy Bucker, MD;  Location: [facility] ORS;  Service: Orthopedics;  Laterality: Right;    Allergies  Allergen Reactions   Naproxen Swelling and Anaphylaxis    Objective/Physical Exam Neurovascular status intact.  Incision well coapted with sutures intact.  No infection or erythema or purulence.  No drainage.  The amputation site appears stable.  There continues to be some thick eschar/scab over the incision site but doing well  Radiographic Exam LT foot 09/05/2022:  Absence of the third toe noted at the level of the MTP.  Orthopedic screw noted within the great toe joint which appear stable.  No gas within the tissue.  There is also some radiolucency within the base of the proximal phalanx and metatarsal head of the fourth ray.  Will observe for now  Assessment: 1. s/p left third toe amputation. DOS: 08/29/2022   Plan of Care:  -Patient was evaluated.  -  Sutures removed.  Light debridement of the area was performed today to remove some of the eschar along the incision site -The central portion of the amputation site continues to require healing.  There is no drainage or clinical indication of infection however the central portion is mildly macerated.  It does not probe deeply and should heal routinely -Recommend Betadine with a light dressing daily.  Betadine ointment was  provided -Return to clinic 3 weeks   Felecia Shelling, DPM [facility]  Dr. Felecia Shelling, DPM    [address] [place], [place] [address]                Office [phone]  Fax [phone]

## 2022-09-25 ENCOUNTER — Encounter: Payer: Medicare HMO | Admitting: Podiatry

## 2022-10-15 ENCOUNTER — Encounter: Payer: Medicare HMO | Admitting: Podiatry

## 2022-10-18 ENCOUNTER — Ambulatory Visit (INDEPENDENT_AMBULATORY_CARE_PROVIDER_SITE_OTHER): Payer: Medicare HMO | Admitting: Internal Medicine

## 2022-10-18 VITALS — BP 134/82 | HR 61 | Temp 96.9°F | Wt 257.0 lb

## 2022-10-18 DIAGNOSIS — Z23 Encounter for immunization: Secondary | ICD-10-CM | POA: Diagnosis not present

## 2022-10-18 DIAGNOSIS — M170 Bilateral primary osteoarthritis of knee: Secondary | ICD-10-CM | POA: Diagnosis not present

## 2022-10-18 MED ORDER — GABAPENTIN 100 MG PO CAPS
100.0000 mg | ORAL_CAPSULE | Freq: Two times a day (BID) | ORAL | 0 refills | Status: DC
Start: 2022-10-18 — End: 2023-03-11

## 2022-10-18 NOTE — Patient Instructions (Signed)
Knee Exercises Ask your health care provider which exercises are safe for you. Do exercises exactly as told by your health care provider and adjust them as directed. It is normal to feel mild stretching, pulling, tightness, or discomfort as you do these exercises. Stop right away if you feel sudden pain or your pain gets worse. Do not begin these exercises until told by your health care provider. Stretching and range-of-motion exercises These exercises warm up your muscles and joints and improve the movement and flexibility of your knee. These exercises also help to relieve pain and swelling. Knee extension, prone  Lie on your abdomen (prone position) on a bed. Place your left / right knee just beyond the edge of the surface so your knee is not on the bed. You can put a towel under your left / right thigh just above your kneecap for comfort. Relax your leg muscles and allow gravity to straighten your knee (extension). You should feel a stretch behind your left / right knee. Hold this position for __________ seconds. Scoot up so your knee is supported between repetitions. Repeat __________ times. Complete this exercise __________ times a day. Knee flexion, active  Lie on your back with both legs straight. If this causes back discomfort, bend your left / right knee so your foot is flat on the floor. Slowly slide your left / right heel back toward your buttocks. Stop when you feel a gentle stretch in the front of your knee or thigh (flexion). Hold this position for __________ seconds. Slowly slide your left / right heel back to the starting position. Repeat __________ times. Complete this exercise __________ times a day. Quadriceps stretch, prone  Lie on your abdomen on a firm surface, such as a bed or padded floor. Bend your left / right knee and hold your ankle. If you cannot reach your ankle or pant leg, loop a belt around your foot and grab the belt instead. Gently pull your heel toward your  buttocks. Your knee should not slide out to the side. You should feel a stretch in the front of your thigh and knee (quadriceps). Hold this position for __________ seconds. Repeat __________ times. Complete this exercise __________ times a day. Hamstring, supine  Lie on your back (supine position). Loop a belt or towel over the ball of your left / right foot. The ball of your foot is on the walking surface, right under your toes. Straighten your left / right knee and slowly pull on the belt to raise your leg until you feel a gentle stretch behind your knee (hamstring). Do not let your knee bend while you do this. Keep your other leg flat on the floor. Hold this position for __________ seconds. Repeat __________ times. Complete this exercise __________ times a day. Strengthening exercises These exercises build strength and endurance in your knee. Endurance is the ability to use your muscles for a long time, even after they get tired. Quadriceps, isometric This exercise strengthens the muscles in front of your thigh (quadriceps) without moving your knee joint (isometric). Lie on your back with your left / right leg extended and your other knee bent. Put a rolled towel or small pillow under your knee if told by your health care provider. Slowly tense the muscles in the front of your left / right thigh. You should see your kneecap slide up toward your hip or see increased dimpling just above the knee. This motion will push the back of the knee toward the floor.  For __________ seconds, hold the muscle as tight as you can without increasing your pain. Relax the muscles slowly and completely. Repeat __________ times. Complete this exercise __________ times a day. Straight leg raises This exercise strengthens the muscles in front of your thigh (quadriceps) and the muscles that move your hips (hip flexors). Lie on your back with your left / right leg extended and your other knee bent. Tense the  muscles in the front of your left / right thigh. You should see your kneecap slide up or see increased dimpling just above the knee. Your thigh may even shake a bit. Keep these muscles tight as you raise your leg 4-6 inches (10-15 cm) off the floor. Do not let your knee bend. Hold this position for __________ seconds. Keep these muscles tense as you lower your leg. Relax your muscles slowly and completely after each repetition. Repeat __________ times. Complete this exercise __________ times a day. Hamstring, isometric  Lie on your back on a firm surface. Bend your left / right knee about __________ degrees. Dig your left / right heel into the surface as if you are trying to pull it toward your buttocks. Tighten the muscles in the back of your thighs (hamstring) to "dig" as hard as you can without increasing any pain. Hold this position for __________ seconds. Release the tension gradually and allow your muscles to relax completely for __________ seconds after each repetition. Repeat __________ times. Complete this exercise __________ times a day. Hamstring curls If told by your health care provider, do this exercise while wearing ankle weights. Begin with __________lb / kg weights. Then increase the weight by 1 lb (0.5 kg) increments. Do not wear ankle weights that are more than __________lb / kg. Lie on your abdomen with your legs straight. Bend your left / right knee as far as you can without feeling pain. Keep your hips flat against the floor. Hold this position for __________ seconds. Slowly lower your leg to the starting position. Repeat __________ times. Complete this exercise __________ times a day. Squats This exercise strengthens the muscles in front of your thigh and knee (quadriceps). Stand in front of a table, with your feet and knees pointing straight ahead. You may rest your hands on the table for balance but not for support. Slowly bend your knees and lower your hips like you  are going to sit in a chair. Keep your weight over your heels, not over your toes. Keep your lower legs upright so they are parallel with the table legs. Do not let your hips go lower than your knees. Do not bend lower than told by your health care provider. If your knee pain increases, do not bend as low. Hold the squat position for __________ seconds. Slowly push with your legs to return to standing. Do not use your hands to pull yourself to standing. Repeat __________ times. Complete this exercise __________ times a day. Wall slides This exercise strengthens the muscles in front of your thigh and knee (quadriceps). Lean your back against a smooth wall or door, and walk your feet out 18-24 inches (46-61 cm) from it. Place your feet hip-width apart. Slowly slide down the wall or door until your knees bend __________ degrees. Keep your knees over your heels, not over your toes. Keep your knees in line with your hips. Hold this position for __________ seconds. Repeat __________ times. Complete this exercise __________ times a day. Straight leg raises, side-lying This exercise strengthens the muscles that rotate  the leg at the hip and move it away from your body (hip abductors). Lie on your side with your left / right leg in the top position. Lie so your head, shoulder, knee, and hip line up. You may bend your bottom knee to help you keep your balance. Roll your hips slightly forward so your hips are stacked directly over each other and your left / right knee is facing forward. Leading with your heel, lift your top leg 4-6 inches (10-15 cm). You should feel the muscles in your outer hip lifting. Do not let your foot drift forward. Do not let your knee roll toward the ceiling. Hold this position for __________ seconds. Slowly return your leg to the starting position. Let your muscles relax completely after each repetition. Repeat __________ times. Complete this exercise __________ times a  day. Straight leg raises, prone This exercise stretches the muscles that move your hips away from the front of the pelvis (hip extensors). Lie on your abdomen on a firm surface. You can put a pillow under your hips if that is more comfortable. Tense the muscles in your buttocks and lift your left / right leg about 4-6 inches (10-15 cm). Keep your knee straight as you lift your leg. Hold this position for __________ seconds. Slowly lower your leg to the starting position. Let your leg relax completely after each repetition. Repeat __________ times. Complete this exercise __________ times a day. This information is not intended to replace advice given to you by your health care provider. Make sure you discuss any questions you have with your health care provider. Document Revised: 10/03/2020 Document Reviewed: 10/03/2020 Elsevier Patient Education  2024 [employer].

## 2022-10-18 NOTE — Progress Notes (Signed)
Subjective:    Patient ID: Kevin Perry, male    DOB: October 14, 1950, 72 y.o.   MRN: [id number]  HPI  Patient presents to the clinic today with complaint of bilateral leg pain.  This is a chronic issue that worsened about 5-6 weeks ago after stopping gabapentin.  He describes the pain as sore and achy.  The pain is located mostly behind the knees and the upper calves.  He denies numbness, tingling or weakness in his lower extremities.  He denies any low back pain.  He has a history of chronic venous insufficiency as well as osteoarthritis of his knees.  He has not tried any additional Tylenol or ibuprofen OTC.  Review of Systems     Past Medical History:  Diagnosis Date   Anxiety    Arthritis    Coronary artery disease    Diabetes mellitus without complication (HCC)    Hypertension    Hypertensive retinopathy    Myocardial infarction [facility])    Postoperative nausea 11/27/2019     Allergies  Allergen Reactions   Naproxen Swelling and Anaphylaxis    No family history on file.  Social History   Socioeconomic History   Marital status: Married    Spouse name: Not on file   Number of children: Not on file   Years of education: Not on file   Highest education level: Not on file  Occupational History   Not on file  Tobacco Use   Smoking status: Former    Current packs/day: 0.00    Types: Cigarettes    Quit date: 08/16/2004    Years since quitting: 18.1   Smokeless tobacco: Former    Quit date: 2006   Tobacco comments:    quit 20 plus years  Vaping Use   Vaping status: Never Used  Substance and Sexual Activity   Alcohol use: Not Currently    Alcohol/week: 2.0 standard drinks of alcohol    Types: 2 Cans of beer per week    Comment: SOCIAL   Drug use: No   Sexual activity: Not on file  Other Topics Concern   Not on file  Social History Narrative   Not on file   Social Determinants of Health   Financial Resource Strain: Not on file  Food Insecurity: Not on file   Transportation Needs: Not on file  Physical Activity: Not on file  Stress: Not on file  Social Connections: Not on file  Intimate Partner Violence: Not on file     Constitutional: Denies fever, malaise, fatigue, headache or abrupt weight changes.  Respiratory: Denies difficulty breathing, shortness of breath, cough or sputum production.   Cardiovascular: Patient reports chronic swelling in legs.  Denies chest pain, chest tightness, palpitations or swelling in the hands.  Musculoskeletal: Patient reports bilateral leg pain.  Denies decrease in range of motion, difficulty with gait, muscle pain or joint swelling.  Skin: Denies redness, rashes, lesions or ulcercations.  Neurological: Denies numbness, tingling, weakness or problems with balance and coordination.    No other specific complaints in a complete review of systems (except as listed in HPI above).  Objective:   Physical Exam  BP 134/82 (BP Location: Left Arm, Patient Position: Sitting, Cuff Size: Normal)   Pulse 61   Temp (!) 96.9 F (36.1 C) (Temporal)   Wt 257 lb (116.6 kg)   SpO2 97%   BMI 33.00 kg/m   Wt Readings from Last 3 Encounters:  08/27/22 256 lb (116.1 kg)  07/15/22  259 lb (117.5 kg)  02/08/22 254 lb 6.4 oz (115.4 kg)    General: Appears his stated age, obese, in NAD. Skin: Warm, dry and intact.  PVD changes noted to BLE without ulceration. Cardiovascular: Normal rate and rhythm.  Trace pitting edema BLE.  Varicose veins noted of BLE. Pulmonary/Chest: Normal effort and positive vesicular breath sounds. No respiratory distress. No wheezes, rales or ronchi noted.  Musculoskeletal: Normal flexion and extension of bilateral knees.  Joint enlargement noted of bilateral knees.  No joint swelling noted.  Gait slow and steady without device. Neurological: Alert and oriented.   BMET    Component Value Date/Time   NA 137 02/22/2021 1049   NA 136 09/19/2011 1203   K 3.8 02/22/2021 1049   K 3.7 09/19/2011 1203    CL 100 02/22/2021 1049   CL 101 09/19/2011 1203   CO2 31 02/22/2021 1049   CO2 26 09/19/2011 1203   GLUCOSE 200 (H) 02/22/2021 1049   GLUCOSE 185 (H) 09/19/2011 1203   BUN 13 02/22/2021 1049   BUN 14 09/19/2011 1203   CREATININE 1.10 05/30/2022 1730   CREATININE 0.88 02/22/2021 1049   CALCIUM 9.2 02/22/2021 1049   CALCIUM 8.8 09/19/2011 1203   GFRNONAA 79 03/02/2020 1130   GFRAA 92 03/02/2020 1130    Lipid Panel     Component Value Date/Time   CHOL 122 02/22/2021 1049   CHOL 153 07/27/2012 1335   TRIG 315 (H) 02/22/2021 1049   TRIG 212 (H) 07/27/2012 1335   HDL 38 (L) 02/22/2021 1049   HDL 34 (L) 07/27/2012 1335   CHOLHDL 3.2 02/22/2021 1049   VLDL 29 09/19/2015 1335   VLDL 42 (H) 07/27/2012 1335   LDLCALC 51 02/22/2021 1049   LDLCALC 77 07/27/2012 1335    CBC    Component Value Date/Time   WBC 6.3 02/22/2021 1049   RBC 4.61 02/22/2021 1049   HGB 15.2 02/22/2021 1049   HGB 14.9 06/20/2011 1445   HCT 43.7 02/22/2021 1049   HCT 42.8 06/20/2011 1445   PLT 186 02/22/2021 1049   PLT 170 06/20/2011 1445   MCV 94.8 02/22/2021 1049   MCV 93 06/20/2011 1445   MCH 33.0 02/22/2021 1049   MCHC 34.8 02/22/2021 1049   RDW 13.2 02/22/2021 1049   RDW 13.2 06/20/2011 1445   LYMPHSABS 2,249 02/22/2021 1049   LYMPHSABS 2.0 06/20/2011 1445   MONOABS 0.6 06/20/2011 1445   EOSABS 265 02/22/2021 1049   EOSABS 0.3 06/20/2011 1445   BASOSABS 50 02/22/2021 1049   BASOSABS 0.1 06/20/2011 1445    Hgb A1C Lab Results  Component Value Date   HGBA1C 6.8 (A) 07/15/2022            Assessment & Plan:   Chronic bilateral knee pain:  Secondary to osteoarthritis Was well-controlled while on gabapentin but he was unable to refill this through MyChart Gabapentin refilled today Encouraged exercise and weight loss as this can help reduce joint pain  Follow-up with your PCP as previously scheduled Nicki Reaper, NP

## 2022-10-22 ENCOUNTER — Telehealth: Payer: Self-pay | Admitting: Family Medicine

## 2022-10-22 NOTE — Telephone Encounter (Signed)
Copied from CRM [id number]. Topic: Medicare AWV >> Oct 22, 2022  9:27 AM Payton Doughty wrote: Reason for CRM: LM 10/22/2022 to schedule AWV   Verlee Rossetti; Care Guide Ambulatory Clinical Support [place] [facility] Direct Dial: [phone]

## 2022-10-29 DIAGNOSIS — Z951 Presence of aortocoronary bypass graft: Secondary | ICD-10-CM | POA: Diagnosis not present

## 2022-10-29 DIAGNOSIS — E782 Mixed hyperlipidemia: Secondary | ICD-10-CM | POA: Diagnosis not present

## 2022-10-29 DIAGNOSIS — I340 Nonrheumatic mitral (valve) insufficiency: Secondary | ICD-10-CM | POA: Diagnosis not present

## 2022-10-29 DIAGNOSIS — I2510 Atherosclerotic heart disease of native coronary artery without angina pectoris: Secondary | ICD-10-CM | POA: Diagnosis not present

## 2022-10-29 DIAGNOSIS — I10 Essential (primary) hypertension: Secondary | ICD-10-CM | POA: Diagnosis not present

## 2022-11-13 ENCOUNTER — Other Ambulatory Visit: Payer: Self-pay | Admitting: Pharmacist

## 2022-11-13 NOTE — Progress Notes (Signed)
11/13/2022  Patient ID: Kevin Perry, male   DOB: [birth year], 72 y.o.   MRN: [id number]  Receive a voicemail message from patient requesting assistance as he has been having difficulty with receiving a refill of his Trulicity through [employer] Patient Assistance program.  Outreach to [facility]	[place], dispensing pharmacy for [facility]. Speak with Gillermina Phy who confirms refill available for patient and program will ship this out to patient's home next week.  Was unable to reach patient via telephone today. Leave a message for patient providing him with the phone number to contact [facility] for further details about his medication shipment.  Follow Up Plan: Will follow up with patient by telephone as previously scheduled on 12/09/2022 at 1:45 PM   Estelle Grumbles, PharmD, [facility] [phone]

## 2022-12-02 ENCOUNTER — Other Ambulatory Visit: Payer: Self-pay | Admitting: Family Medicine

## 2022-12-02 DIAGNOSIS — E1169 Type 2 diabetes mellitus with other specified complication: Secondary | ICD-10-CM

## 2022-12-02 DIAGNOSIS — I10 Essential (primary) hypertension: Secondary | ICD-10-CM

## 2022-12-03 NOTE — Telephone Encounter (Signed)
Requested medication (s) are due for refill today:   Yes for both  Requested medication (s) are on the active medication list:   Yes for both  Future visit scheduled:   Yes 12/16   Last ordered: Both 08/29/2022 90 days with 1 refill each.  Unable to refill because labs are due per protocol.     Requested Prescriptions  Pending Prescriptions Disp Refills   metFORMIN (GLUCOPHAGE) 500 MG tablet [Pharmacy Med Name: METFORMIN HCL 500 MG TAB] 270 tablet 1    Sig: TAKE 2 TABLETS BY MOUTH ONCE EVERY MORNING WITH BREAKFAST AND 1 TABLET ONCEEVERY EVENING WITH DINNER     Endocrinology:  Diabetes - Biguanides Failed - 12/02/2022  9:04 AM      Failed - eGFR in normal range and within 360 days    GFR, Est African American  Date Value Ref Range Status  03/02/2020 92 > OR = 60 mL/min/1.22m2 Final   GFR, Est Non African American  Date Value Ref Range Status  03/02/2020 79 > OR = 60 mL/min/1.40m2 Final   eGFR  Date Value Ref Range Status  02/22/2021 93 > OR = 60 mL/min/1.28m2 Final    Comment:    The eGFR is based on the CKD-EPI 2021 equation. To calculate  the new eGFR from a previous Creatinine or Cystatin C result, go to https://www.kidney.org/professionals/ kdoqi/gfr%5Fcalculator          Failed - B12 Level in normal range and within 720 days    No results found for: "VITAMINB12"       Failed - CBC within normal limits and completed in the last 12 months    WBC  Date Value Ref Range Status  02/22/2021 6.3 3.8 - 10.8 Thousand/uL Final   RBC  Date Value Ref Range Status  02/22/2021 4.61 4.20 - 5.80 Million/uL Final   Hemoglobin  Date Value Ref Range Status  02/22/2021 15.2 13.2 - 17.1 g/dL Final   HGB  Date Value Ref Range Status  06/20/2011 14.9 13.0 - 18.0 g/dL Final   HCT  Date Value Ref Range Status  02/22/2021 43.7 38.5 - 50.0 % Final  06/20/2011 42.8 40.0 - 52.0 % Final   MCHC  Date Value Ref Range Status  02/22/2021 34.8 32.0 - 36.0 g/dL Final   [facility]  Date  Value Ref Range Status  02/22/2021 33.0 27.0 - 33.0 pg Final   MCV  Date Value Ref Range Status  02/22/2021 94.8 80.0 - 100.0 fL Final  06/20/2011 93 80 - 100 fL Final   No results found for: "PLTCOUNTKUC", "LABPLAT", "POCPLA" RDW  Date Value Ref Range Status  02/22/2021 13.2 11.0 - 15.0 % Final  06/20/2011 13.2 11.5 - 14.5 % Final         Passed - Cr in normal range and within 360 days    Creat  Date Value Ref Range Status  02/22/2021 0.88 0.70 - 1.28 mg/dL Final   Creatinine, Ser  Date Value Ref Range Status  05/30/2022 1.10 0.61 - 1.24 mg/dL Final   Creatinine, POC  Date Value Ref Range Status  03/09/2015 0 mg/dL Final   Creatinine, Urine  Date Value Ref Range Status  07/15/2022 99 20 - 320 mg/dL Final         Passed - HBA1C is between 0 and 7.9 and within 180 days    Hemoglobin A1C  Date Value Ref Range Status  07/15/2022 6.8 (A) 4.0 - 5.6 % Final  07/27/2012 7.6 (H) 4.2 -  6.3 % Final    Comment:    The American Diabetes Association recommends that a primary goal of therapy should be <7% and that physicians should reevaluate the treatment regimen in patients with HbA1c values consistently >8%.    Hgb A1c MFr Bld  Date Value Ref Range Status  02/22/2021 7.3 (H) <5.7 % of total Hgb Final    Comment:    For someone without known diabetes, a hemoglobin A1c value of 6.5% or greater indicates that they may have  diabetes and this should be confirmed with a follow-up  test. . For someone with known diabetes, a value <7% indicates  that their diabetes is well controlled and a value  greater than or equal to 7% indicates suboptimal  control. A1c targets should be individualized based on  duration of diabetes, age, comorbid conditions, and  other considerations. . Currently, no consensus exists regarding use of hemoglobin A1c for diagnosis of diabetes for children. Verna Czech - Valid encounter within last 6 months    Recent Outpatient Visits            1 month ago Need for influenza vaccination   [place] [address] [facility] [place], [place] W, NP   4 months ago Type 2 diabetes mellitus with other specified complication, without long-term current use of insulin [facility])   [place] [facility] [place], Netta Neat, DO   4 months ago Type 2 diabetes mellitus with other specified complication, without long-term current use of insulin [facility])   [place] [facility] Delles, Gentry Fitz A, RPH-CPP   9 months ago Type 2 diabetes mellitus with other specified complication, without long-term current use of insulin [facility])   [place] [facility] Delles, Gentry Fitz A, RPH-CPP   9 months ago Acute cough   [place] [facility] Althea Charon, Netta Neat, DO       Future Appointments             In 1 month Althea Charon, Netta Neat, DO [place] [facility]             metoprolol tartrate (LOPRESSOR) 25 MG tablet [Pharmacy Med Name: METOPROLOL TARTRATE 25 MG TAB] 180 tablet 0    Sig: TAKE 1 TABLET BY MOUTH TWICE DAILY     Cardiovascular:  Beta Blockers Passed - 12/02/2022  9:04 AM      Passed - Last BP in normal range    BP Readings from Last 1 Encounters:  10/18/22 134/82         Passed - Last Heart Rate in normal range    Pulse Readings from Last 1 Encounters:  10/18/22 61         Passed - Valid encounter within last 6 months    Recent Outpatient Visits           1 month ago Need for influenza vaccination   [place] [facility] [place], [place] W, NP   4 months ago Type 2 diabetes mellitus with other specified complication, without long-term current use of insulin [facility])   [place] [facility] [place], Netta Neat, DO   4 months ago Type 2 diabetes mellitus with other specified complication, without long-term current use of insulin [facility])   [place] [facility] Delles,  Gentry Fitz A, RPH-CPP   9 months ago Type 2 diabetes mellitus with other  specified complication, without long-term current use of insulin [facility])   [place] [facility] Delles, Gentry Fitz A, RPH-CPP   9 months ago Acute cough   [place] [facility] Smitty Cords, DO       Future Appointments             In 1 month Althea Charon, Netta Neat, DO  [facility], [place]

## 2022-12-09 ENCOUNTER — Other Ambulatory Visit: Payer: Self-pay | Admitting: Family Medicine

## 2022-12-09 ENCOUNTER — Ambulatory Visit: Payer: Medicare HMO | Admitting: Pharmacist

## 2022-12-09 DIAGNOSIS — I10 Essential (primary) hypertension: Secondary | ICD-10-CM

## 2022-12-09 DIAGNOSIS — E1169 Type 2 diabetes mellitus with other specified complication: Secondary | ICD-10-CM

## 2022-12-09 DIAGNOSIS — E785 Hyperlipidemia, unspecified: Secondary | ICD-10-CM

## 2022-12-09 NOTE — Progress Notes (Unsigned)
12/09/2022 Name: Kevin Perry MRN: [id number] DOB: 1950-11-26  Chief Complaint  Patient presents with   Medication Assistance    Kevin Perry is a 72 y.o. year old male who presented for a telephone visit.   They were referred to the pharmacist by their PCP for assistance in managing diabetes, hypertension, hyperlipidemia, and medication access.      Subjective:   Care Team: Primary Care Provider: Smitty Cords, DO; Next Scheduled Visit: 01/20/2023 Cardiologist: Olena Heckle, MD Gastroenterology: Rowan Blase, NP; Next Scheduled Visit: 02/19/2023  Medication Access/Adherence  Current Pharmacy:  [facility] [place], [place] - [address] [place] [address] Phone: [phone] Fax: [phone]  [facility] [address] [place] - [address] [place] [address] Phone: [phone] Fax: [phone]  [facility] [place], [place] - [facility] Portal to Registered [address] [place] [address] Phone: [phone] Fax: [phone]  [facility] - [place], [place] - [address] [place] [address] Phone: [phone] Fax: [phone]   Patient reports affordability concerns with their medications: No  Patient reports access/transportation concerns to their pharmacy: No  Patient reports adherence concerns with their medications:  No    Diabetes:   Current medications:  Metformin IR 500mg  x 2 in AM / x 1 in PM Trulicity 3 mg weekly Medications tried in the past: glipizide     Morning fasting glucose today: 131; recently ranging 112-150   Patient denies hypoglycemic s/sx including dizziness, shakiness, sweating.    Statin: atorvastatin 40 mg daily   Current medication access support: APPROVED for Trulicity from [facility] Patient Assistance Program through 02/04/23 Reports  currently has 3 month supply remaining  Current physical activity: starting back to walking ~1.5 miles x 5 days/week   Hypertension:   Current medications:  Amlodipine 10mg  daily Furosemide 40 mg daily Lisinopril 20 mg twice daily Metoprolol tartrate 25 mg twice daily   Patient has an automated, upper arm home BP cuff   Reports home blood pressure reading yesterday: 123/78  Current physical activity: starting back to walking ~1.5 miles x 5 days/week  Objective:  Lab Results  Component Value Date   HGBA1C 6.8 (A) 07/15/2022    Lab Results  Component Value Date   CREATININE 1.10 05/30/2022   BUN 13 02/22/2021   NA 137 02/22/2021   K 3.8 02/22/2021   CL 100 02/22/2021   CO2 31 02/22/2021    Lab Results  Component Value Date   CHOL 122 02/22/2021   HDL 38 (L) 02/22/2021   LDLCALC 51 02/22/2021   TRIG 315 (H) 02/22/2021   CHOLHDL 3.2 02/22/2021   BP Readings from Last 3 Encounters:  10/18/22 134/82  08/27/22 138/87  08/14/22 (!) 155/77   Pulse Readings from Last 3 Encounters:  10/18/22 61  08/27/22 (!) 45  08/14/22 (!) 56     Medications Reviewed Today     Reviewed by Manuela Neptune, RPH-CPP (Pharmacist) on 12/09/22 at 1349  Med List Status: <None>   Medication Order Taking? Sig Documenting Provider Last Dose Status Informant  amLODipine (NORVASC) 10 MG tablet [id number] Yes TAKE 1 TABLET BY MOUTH ONCE DAILY Smitty Cords, DO Taking Active   aspirin EC 81 MG tablet [id number] Yes Take 81 mg by mouth daily. Swallow whole. [provider] Taking Active   atorvastatin (LIPITOR) 40  MG tablet [id number] Yes TAKE 1 TABLET BY MOUTH ONCE EVERY EVENING Lorre Munroe, NP Taking Active   buPROPion (WELLBUTRIN XL) 300 MG 24 hr tablet [id number] Yes TAKE 1 TABLET BY MOUTH ONCE DAILY ** DOSE INCREASE Baity, Salvadore [place], NP Taking Active   escitalopram (LEXAPRO) 20 MG tablet [id number] Yes TAKE 1 TABLET BY MOUTH ONCE DAILY AS DIRECTED TAPER AS DIRECTED.  OFF ZOLOFT AND ON TO ESCITALOPRAM Smitty Cords, DO Taking Active   folic acid (FOLVITE) 1 MG tablet [id number] Yes Take 1 mg by mouth See admin instructions. Take 1 mg daily except skip dose on Wednesdays (methotrexate day) [provider] Taking Active Self  furosemide (LASIX) 40 MG tablet [id number] Yes TAKE 1 TABLET BY MOUTH ONCE DAILY Karamalegos, Netta Neat, DO Taking Active   gabapentin (NEURONTIN) 100 MG capsule [id number] Yes Take 1 capsule (100 mg total) by mouth 2 (two) times daily. Lorre Munroe, NP Taking Active   lisinopril (ZESTRIL) 20 MG tablet [id number] Yes Take 1 tablet by mouth 2 (two) times daily. [provider] Taking Active   loratadine (CLARITIN) 10 MG tablet [id number] Yes Take 10 mg by mouth every morning. [provider] Taking Active Self  metFORMIN (GLUCOPHAGE) 500 MG tablet [id number] Yes TAKE 2 TABLETS BY MOUTH ONCE EVERY MORNING WITH BREAKFAST AND 1 TABLET ONCEEVERY EVENING WITH DINNER Karamalegos, Netta Neat, DO Taking Active   methotrexate (RHEUMATREX) 2.5 MG tablet [id number] Yes Take 10 mg by mouth once a week. On Wednesdays [provider] Taking Active   metoprolol tartrate (LOPRESSOR) 25 MG tablet [id number] Yes TAKE 1 TABLET BY MOUTH TWICE DAILY Karamalegos, Netta Neat, DO Taking Active   nitroGLYCERIN (NITROSTAT) 0.4 MG SL tablet [id number]  Place under the tongue. [provider]  Active   TRULICITY 3 MG/0.5ML SOPN [id number] Yes INJECT 3 MG (0.5 ML) UNDER THE SKIN ONCE A WEEK Karamalegos, Netta Neat, DO Taking Active               Assessment/Plan:   Comprehensive medication review performed; medication list updated in electronic medical record  Diabetes: - Have counseled on importance of having well-balanced meals, while controlling carbohydrate portion sizes - Recommend to continue to check glucose and keep a log of the results - Will collaborate with PCP and CPhT to aid patient with  re-enrollment in patient assistance program for Trulicity from [facility] for 2025    Hypertension: - Have reviewed appropriate blood pressure monitoring technique and reviewed goal blood pressure.  - Recommend patient to continue to monitor home BP, keep log of results including HR readings and bring this record to medical appointments      Follow Up Plan: Clinical Pharmacist to follow up with patient by telephone on 02/24/2023 at 1:30 PM    Estelle Grumbles, PharmD, P[place]y [place], CPP Clinical Pharmacist [facility] [phone]

## 2022-12-10 NOTE — Patient Instructions (Signed)
Goals Addressed             This Visit's Progress    Pharmacy Goals       Our goal A1c is less than 7%. This corresponds with fasting sugars less than 130 and 2 hour after meal sugars less than 180. Please check your blood sugar and keep log of the results  Our goal bad cholesterol, or LDL, is less than 70 . This is why it is important to continue taking your atorvastatin   Please check your home blood pressure, keep a log of the results and have this to review during our next appointment.  Feel free to call me with any questions or concerns. I look forward to our next call!  Wallace Cullens, PharmD, [place] [phone]

## 2022-12-10 NOTE — Telephone Encounter (Signed)
Requested Prescriptions  Pending Prescriptions Disp Refills   amLODipine (NORVASC) 10 MG tablet [Pharmacy Med Name: AMLODIPINE BESYLATE 10 MG TAB] 90 tablet 0    Sig: TAKE 1 TABLET BY MOUTH ONCE DAILY     Cardiovascular: Calcium Channel Blockers 2 Passed - 12/09/2022  9:11 AM      Passed - Last BP in normal range    BP Readings from Last 1 Encounters:  10/18/22 134/82         Passed - Last Heart Rate in normal range    Pulse Readings from Last 1 Encounters:  10/18/22 61         Passed - Valid encounter within last 6 months    Recent Outpatient Visits           Yesterday Type 2 diabetes mellitus with other specified complication, without long-term current use of insulin (HCC)   [place] [facility] Delles, Gentry Fitz A, RPH-CPP   1 month ago Need for influenza vaccination   [place] [facility] [place], [place] W, NP   4 months ago Type 2 diabetes mellitus with other specified complication, without long-term current use of insulin [facility])   [place] [facility] [place], Netta Neat, DO   5 months ago Type 2 diabetes mellitus with other specified complication, without long-term current use of insulin [facility])   [place] [facility] Delles, Gentry Fitz A, RPH-CPP   9 months ago Type 2 diabetes mellitus with other specified complication, without long-term current use of insulin (HCC)   [place] [facility] Delles, Jackelyn Poling, RPH-CPP       Future Appointments             In 1 month Althea Charon, Netta Neat, DO [place] [facility], [place] [facility]

## 2022-12-17 ENCOUNTER — Other Ambulatory Visit: Payer: Self-pay | Admitting: Family Medicine

## 2022-12-17 DIAGNOSIS — I10 Essential (primary) hypertension: Secondary | ICD-10-CM

## 2022-12-18 NOTE — Telephone Encounter (Signed)
Reordered 8 days ago (12/10/2022)   Requested Prescriptions  Refused Prescriptions Disp Refills   amLODipine (NORVASC) 10 MG tablet [Pharmacy Med Name: AMLODIPINE BESYLATE 10 MG TAB] 90 tablet 0    Sig: TAKE 1 TABLET BY MOUTH ONCE DAILY     Cardiovascular: Calcium Channel Blockers 2 Passed - 12/17/2022 10:56 AM      Passed - Last BP in normal range    BP Readings from Last 1 Encounters:  10/18/22 134/82         Passed - Last Heart Rate in normal range    Pulse Readings from Last 1 Encounters:  10/18/22 61         Passed - Valid encounter within last 6 months    Recent Outpatient Visits           1 week ago Type 2 diabetes mellitus with other specified complication, without long-term current use of insulin (HCC)   [place] [facility] Delles, Gentry Fitz A, RPH-CPP   2 months ago Need for influenza vaccination   [place] [facility] [place], [place] W, NP   5 months ago Type 2 diabetes mellitus with other specified complication, without long-term current use of insulin [facility])   [place] [facility] [place], Netta Neat, DO   5 months ago Type 2 diabetes mellitus with other specified complication, without long-term current use of insulin [facility])   [place] [facility] Delles, Gentry Fitz A, RPH-CPP   9 months ago Type 2 diabetes mellitus with other specified complication, without long-term current use of insulin (HCC)   [place] [facility] Delles, Jackelyn Poling, RPH-CPP       Future Appointments             In 1 month Althea Charon, Netta Neat, DO [place] [facility], [facility]

## 2022-12-23 ENCOUNTER — Other Ambulatory Visit: Payer: Self-pay | Admitting: Pharmacy Technician

## 2022-12-23 DIAGNOSIS — Z5986 Financial insecurity: Secondary | ICD-10-CM

## 2022-12-23 NOTE — Progress Notes (Signed)
  Pharmacy Medication Assistance Program Note    12/23/2022  Patient ID: Kevin Perry, male   DOB: 1950-04-12, 72 y.o.   MRN: [id number]     12/23/2022  Outreach Medication One  Initial Outreach Date (Medication One) 12/23/2022  Manufacturer Medication One [employer] Trulicity  Dose of Trulicity 3mg /0.29ml  Type of Radiographer, therapeutic Assistance  Date Application Sent to Patient 12/25/2022  Application Items Requested Application;Proof of Income;Other  Date Application Sent to Prescriber 12/25/2022  Name of Prescriber [facility]       Signature  Pattricia Boss, CPhT [place]  Office: [phone] Fax: [phone] Email: [email or web address]

## 2022-12-24 ENCOUNTER — Other Ambulatory Visit: Payer: Self-pay | Admitting: Family Medicine

## 2022-12-24 ENCOUNTER — Encounter: Payer: Self-pay | Admitting: Family Medicine

## 2022-12-24 ENCOUNTER — Ambulatory Visit (INDEPENDENT_AMBULATORY_CARE_PROVIDER_SITE_OTHER): Payer: Medicare HMO | Admitting: Family Medicine

## 2022-12-24 VITALS — BP 122/62 | Ht 74.0 in | Wt 262.0 lb

## 2022-12-24 DIAGNOSIS — I10 Essential (primary) hypertension: Secondary | ICD-10-CM | POA: Diagnosis not present

## 2022-12-24 DIAGNOSIS — N138 Other obstructive and reflux uropathy: Secondary | ICD-10-CM

## 2022-12-24 DIAGNOSIS — E785 Hyperlipidemia, unspecified: Secondary | ICD-10-CM | POA: Diagnosis not present

## 2022-12-24 DIAGNOSIS — R351 Nocturia: Secondary | ICD-10-CM

## 2022-12-24 DIAGNOSIS — E1169 Type 2 diabetes mellitus with other specified complication: Secondary | ICD-10-CM

## 2022-12-24 DIAGNOSIS — E291 Testicular hypofunction: Secondary | ICD-10-CM

## 2022-12-24 DIAGNOSIS — Z0000 Encounter for general adult medical examination without abnormal findings: Secondary | ICD-10-CM

## 2022-12-24 DIAGNOSIS — E538 Deficiency of other specified B group vitamins: Secondary | ICD-10-CM

## 2022-12-24 DIAGNOSIS — M79652 Pain in left thigh: Secondary | ICD-10-CM

## 2022-12-24 DIAGNOSIS — M79651 Pain in right thigh: Secondary | ICD-10-CM

## 2022-12-24 DIAGNOSIS — G720 Drug-induced myopathy: Secondary | ICD-10-CM

## 2022-12-24 DIAGNOSIS — E66811 Obesity, class 1: Secondary | ICD-10-CM

## 2022-12-24 DIAGNOSIS — E559 Vitamin D deficiency, unspecified: Secondary | ICD-10-CM

## 2022-12-24 MED ORDER — BACLOFEN 10 MG PO TABS
5.0000 mg | ORAL_TABLET | Freq: Three times a day (TID) | ORAL | 2 refills | Status: DC | PRN
Start: 2022-12-24 — End: 2023-10-13

## 2022-12-24 NOTE — Patient Instructions (Addendum)
Thank you for coming to the office today.  Leg pain may be from Statin therapy  Our exam today did not show any sign of hip or knee or spine dysfunction. Your range of motion looks great today.  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  Try the HALF dose statin Atorvastatin from 40 down to 20mg . For 2 weeks, if this works, let me know we can re order the lower dose.  If not effective you can PAUSE the 20mg  for 2 weeks and try 0 and let me know how this goes.    Please schedule a Follow-up Appointment to: Return if symptoms worsen or fail to improve.  If you have any other questions or concerns, please feel free to call the office or send a message through MyChart. You may also schedule an earlier appointment if necessary.  Additionally, you may be receiving a survey about your experience at our office within a few days to 1 week by e-mail or mail. We value your feedback.  Saralyn Pilar, DO [facility], [place]

## 2022-12-24 NOTE — Progress Notes (Signed)
Subjective:    Patient ID: Kevin Perry, male    DOB: January 23, 1951, 72 y.o.   MRN: [id number]  Kevin Perry is a 72 y.o. male presenting on 12/24/2022 for Leg Pain (Bilateral, worse in the morning, started 3-4 months ago, denies injury)   HPI  Discussed the use of AI scribe software for clinical note transcription with the patient, who gave verbal consent to proceed.  Bilateral Leg Pain  The patient, with a history of hyperlipidemia, presents with bilateral leg pain, described as a sensation akin to a pulled hamstring. The pain is most severe upon waking and necessitates the use of support to ambulate. The discomfort is located in the posterior aspect of the thighs, extending past the knees into the calves. The pain improves after two to three hours of activity but persists throughout the day. The patient has been on cholesterol medication for over three years and questions whether this could be contributing to the muscle pain. The patient also reports a lack of energy but denies any depressive symptoms, stating he eats and sleeps well. The patient has been experiencing these symptoms for approximately three to four months.     Additionally. He has had prior CT Abd Pelvis that shows degenerative arthritis of lumbar spine S/p R TKR No focal knee pain or symptoms  He is on Atorvastatin 40mg  daily, in past history was on lower dose 10mg . He has been on higher dose 2+ years now      12/24/2022    2:14 PM 07/15/2022   10:19 AM 07/18/2021    8:53 AM  Depression screen PHQ 2/9  Decreased Interest 0 0 3  Down, Depressed, Hopeless 0 0 2  PHQ - 2 Score 0 0 5  Altered sleeping 0 0 2  Tired, decreased energy 0 1 2  Change in appetite 0 0 0  Feeling bad or failure about yourself  0 0 0  Trouble concentrating 0 0 1  Moving slowly or fidgety/restless 0 0 0  Suicidal thoughts 0 0 0  PHQ-9 Score 0 1 10  Difficult doing work/chores  Not difficult at all Somewhat difficult        12/24/2022    2:14 PM 07/15/2022   10:20 AM 07/18/2021    8:53 AM 06/20/2021    9:02 AM  GAD 7 : Generalized Anxiety Score  Nervous, Anxious, on Edge 0 0 0 0  Control/stop worrying 0 0 0 0  Worry too much - different things 0 0 0 0  Trouble relaxing 0 0 0 0  Restless 0 0 0 0  Easily annoyed or irritable 0 0 0 1  Afraid - awful might happen 0 0 0 0  Total GAD 7 Score 0 0 0 1  Anxiety Difficulty  Not difficult at all Not difficult at all Somewhat difficult    Social History   Tobacco Use   Smoking status: Former    Current packs/day: 0.00    Types: Cigarettes    Quit date: 08/16/2004    Years since quitting: 18.3   Smokeless tobacco: Former    Quit date: 2006   Tobacco comments:    quit 20 plus years  Vaping Use   Vaping status: Never Used  Substance Use Topics   Alcohol use: Not Currently    Alcohol/week: 2.0 standard drinks of alcohol    Types: 2 Cans of beer per week    Comment: SOCIAL   Drug use: No    Review of  Systems Per HPI unless specifically indicated above     Objective:    BP 122/62   Ht 6\' 2"  (1.88 m)   Wt 262 lb (118.8 kg)   BMI 33.64 kg/m   Wt Readings from Last 3 Encounters:  12/24/22 262 lb (118.8 kg)  10/18/22 257 lb (116.6 kg)  08/27/22 256 lb (116.1 kg)    Physical Exam Vitals and nursing note reviewed.  Constitutional:      General: He is not in acute distress.    Appearance: Normal appearance. He is well-developed. He is not diaphoretic.     Comments: Well-appearing, comfortable, cooperative  HENT:     Head: Normocephalic and atraumatic.  Eyes:     General:        Right eye: No discharge.        Left eye: No discharge.     Conjunctiva/sclera: Conjunctivae normal.  Cardiovascular:     Rate and Rhythm: Normal rate.  Pulmonary:     Effort: Pulmonary effort is normal.  Musculoskeletal:     Comments: Bilateral Hips with normal full range of motion flexion extension internal rotation. No provoked hip pain with acetabular compression,  or supine ROM FABER FADIR  Knee R s/p TKR Full range of motion without difficulty  Left knee with full range of motion. No deformity or swelling or localized provoked symptoms  Skin:    General: Skin is warm and dry.     Findings: No erythema or rash.  Neurological:     Mental Status: He is alert and oriented to person, place, and time.  Psychiatric:        Mood and Affect: Mood normal.        Behavior: Behavior normal.        Thought Content: Thought content normal.     Comments: Well groomed, good eye contact, normal speech and thoughts      I have personally reviewed the radiology report from 05/30/22 on CT Abdomen Pelvis.  CLINICAL DATA:  Nausea/vomiting/diarrhea, dizziness, weight loss   EXAM: CT ABDOMEN AND PELVIS WITH CONTRAST   TECHNIQUE: Multidetector CT imaging of the abdomen and pelvis was performed using the standard protocol following bolus administration of intravenous contrast.   RADIATION DOSE REDUCTION: This exam was performed according to the departmental dose-optimization program which includes automated exposure control, adjustment of the mA and/or kV according to patient size and/or use of iterative reconstruction technique.   CONTRAST:  OMNIPAQUE IOHEXOL 300 MG/ML  SOLN   COMPARISON:  None Available.   FINDINGS: Lower chest: Mild subpleural reticulation/fibrosis at the lung bases.   Hepatobiliary: Liver is within normal limits.   Status post cholecystectomy. No intrahepatic or extrahepatic ductal dilatation.   Pancreas: Within normal limits.   Spleen: Within normal limits.   Adrenals/Urinary Tract: Adrenal glands are within normal limits.   Left kidney is within normal limits.   Right kidney is notable for a 12 mm nonobstructing right lower pole renal calculus (series 2/image 45). Mild fullness of the right upper pole renal collecting system with associated 16 mm calculus in the right renal pelvis (series 2/image 42).   Bladder  is within normal limits.   Stomach/Bowel: Stomach is within normal limits.   No evidence of bowel obstruction.   Normal appendix (series 2/image 52).   Left colonic diverticulosis, without evidence of diverticulitis.   Vascular/Lymphatic: No evidence of abdominal aortic aneurysm.   Atherosclerotic calcifications of the abdominal aorta and branch vessels.   No suspicious abdominopelvic  lymphadenopathy.   Reproductive: Prostate is unremarkable.   Other: No abdominopelvic ascites.   Musculoskeletal: Mild degenerative changes of the visualized thoracolumbar spine.   IMPRESSION: 16 mm calculus in the right renal pelvis with mild fullness of the right upper pole renal collecting system.   12 mm nonobstructing right lower pole renal calculus.   Left colonic diverticulosis, without evidence of diverticulitis.     Electronically Signed   By: Charline Bills M.D.   On: 06/04/2022 04:00  Results for orders placed or performed during the hospital encounter of 08/27/22  Glucose, capillary  Result Value Ref Range   Glucose-Capillary 156 (H) 70 - 99 mg/dL      Assessment & Plan:   Problem List Items Addressed This Visit     Essential hypertension   Relevant Medications   atorvastatin (LIPITOR) 40 MG tablet   Hyperlipidemia associated with type 2 diabetes mellitus (HCC)   Relevant Medications   atorvastatin (LIPITOR) 40 MG tablet   Other Visit Diagnoses     Bilateral thigh pain    -  Primary   Relevant Medications   baclofen (LIORESAL) 10 MG tablet   Drug-induced myopathy             Bilateral Leg Pain Severe pain in both legs, worse in the morning and improves with movement. Pain is located in the posterior thigh and extends down past the knee into the calf. Differential includes arthritis, muscle injury, and statin-induced myopathy. Physical examination does not suggest hip or knee pathology -Reduce Atorvastatin from 40mg  to 20mg  daily for 2 weeks to assess for  improvement in symptoms. -If no improvement after 2 weeks, discontinue Atorvastatin completely for an additional 2 weeks. -Consider imaging studies (Lumbar X-rays, possible Hip vs Knee but seems less likely given exam) if no improvement with medication adjustment. -Prescribe Baclofen as needed for muscle pain relief.  General Health Maintenance -Complete blood panel and testosterone level scheduled for 01/13/2023. -Follow-up appointment scheduled for 01/20/2023.         No orders of the defined types were placed in this encounter.   Meds ordered this encounter  Medications   baclofen (LIORESAL) 10 MG tablet    Sig: Take 0.5-1 tablets (5-10 mg total) by mouth 3 (three) times daily as needed for muscle spasms.    Dispense:  30 each    Refill:  2    Follow up plan: Return if symptoms worsen or fail to improve.  Future labs ordered. 01/13/23  Saralyn Pilar, DO [facility] 12/24/2022, 2:22 PM

## 2022-12-31 ENCOUNTER — Ambulatory Visit
Admission: RE | Admit: 2022-12-31 | Discharge: 2022-12-31 | Disposition: A | Discharge status: Home / Self Care | Payer: Medicare HMO | Attending: Family Medicine | Admitting: Family Medicine

## 2022-12-31 ENCOUNTER — Other Ambulatory Visit: Payer: Self-pay | Admitting: Family Medicine

## 2022-12-31 ENCOUNTER — Ambulatory Visit
Admission: RE | Admit: 2022-12-31 | Discharge: 2022-12-31 | Disposition: A | Discharge status: Home / Self Care | Payer: Medicare HMO | Source: Ambulatory Visit | Attending: Family Medicine | Admitting: Family Medicine

## 2022-12-31 DIAGNOSIS — M51362 Other intervertebral disc degeneration, lumbar region with discogenic back pain and lower extremity pain: Secondary | ICD-10-CM | POA: Insufficient documentation

## 2022-12-31 DIAGNOSIS — M79651 Pain in right thigh: Secondary | ICD-10-CM

## 2022-12-31 DIAGNOSIS — M47817 Spondylosis without myelopathy or radiculopathy, lumbosacral region: Secondary | ICD-10-CM | POA: Diagnosis not present

## 2022-12-31 DIAGNOSIS — M79652 Pain in left thigh: Secondary | ICD-10-CM | POA: Diagnosis not present

## 2022-12-31 DIAGNOSIS — M47816 Spondylosis without myelopathy or radiculopathy, lumbar region: Secondary | ICD-10-CM | POA: Diagnosis not present

## 2022-12-31 DIAGNOSIS — M545 Low back pain, unspecified: Secondary | ICD-10-CM | POA: Diagnosis not present

## 2023-01-06 DIAGNOSIS — L570 Actinic keratosis: Secondary | ICD-10-CM | POA: Diagnosis not present

## 2023-01-06 DIAGNOSIS — L853 Xerosis cutis: Secondary | ICD-10-CM | POA: Diagnosis not present

## 2023-01-06 DIAGNOSIS — Z85828 Personal history of other malignant neoplasm of skin: Secondary | ICD-10-CM | POA: Diagnosis not present

## 2023-01-06 DIAGNOSIS — Z08 Encounter for follow-up examination after completed treatment for malignant neoplasm: Secondary | ICD-10-CM | POA: Diagnosis not present

## 2023-01-06 DIAGNOSIS — D1801 Hemangioma of skin and subcutaneous tissue: Secondary | ICD-10-CM | POA: Diagnosis not present

## 2023-01-06 DIAGNOSIS — L814 Other melanin hyperpigmentation: Secondary | ICD-10-CM | POA: Diagnosis not present

## 2023-01-06 DIAGNOSIS — L308 Other specified dermatitis: Secondary | ICD-10-CM | POA: Diagnosis not present

## 2023-01-10 ENCOUNTER — Telehealth: Payer: Self-pay | Admitting: Family Medicine

## 2023-01-10 NOTE — Telephone Encounter (Signed)
Patient has upcoming lab appointment next week. We will fax once we have results.

## 2023-01-10 NOTE — Telephone Encounter (Signed)
Lennox Laity from [facility] called stated patient is due for refills so they need his last lab results faxed to [phone]. Release was faxed and recvd on 12/03.

## 2023-01-13 ENCOUNTER — Other Ambulatory Visit: Payer: Medicare HMO

## 2023-01-13 DIAGNOSIS — Z0000 Encounter for general adult medical examination without abnormal findings: Secondary | ICD-10-CM

## 2023-01-13 DIAGNOSIS — I10 Essential (primary) hypertension: Secondary | ICD-10-CM

## 2023-01-13 DIAGNOSIS — E291 Testicular hypofunction: Secondary | ICD-10-CM

## 2023-01-13 DIAGNOSIS — E559 Vitamin D deficiency, unspecified: Secondary | ICD-10-CM

## 2023-01-13 DIAGNOSIS — N138 Other obstructive and reflux uropathy: Secondary | ICD-10-CM

## 2023-01-13 DIAGNOSIS — R351 Nocturia: Secondary | ICD-10-CM

## 2023-01-13 DIAGNOSIS — E1169 Type 2 diabetes mellitus with other specified complication: Secondary | ICD-10-CM

## 2023-01-13 DIAGNOSIS — E538 Deficiency of other specified B group vitamins: Secondary | ICD-10-CM

## 2023-01-17 ENCOUNTER — Other Ambulatory Visit: Payer: Medicare HMO

## 2023-01-17 DIAGNOSIS — I10 Essential (primary) hypertension: Secondary | ICD-10-CM | POA: Diagnosis not present

## 2023-01-17 DIAGNOSIS — E559 Vitamin D deficiency, unspecified: Secondary | ICD-10-CM | POA: Diagnosis not present

## 2023-01-17 DIAGNOSIS — E538 Deficiency of other specified B group vitamins: Secondary | ICD-10-CM | POA: Diagnosis not present

## 2023-01-17 DIAGNOSIS — E291 Testicular hypofunction: Secondary | ICD-10-CM | POA: Diagnosis not present

## 2023-01-17 DIAGNOSIS — E1169 Type 2 diabetes mellitus with other specified complication: Secondary | ICD-10-CM | POA: Diagnosis not present

## 2023-01-17 DIAGNOSIS — R351 Nocturia: Secondary | ICD-10-CM | POA: Diagnosis not present

## 2023-01-17 DIAGNOSIS — E785 Hyperlipidemia, unspecified: Secondary | ICD-10-CM | POA: Diagnosis not present

## 2023-01-17 DIAGNOSIS — N401 Enlarged prostate with lower urinary tract symptoms: Secondary | ICD-10-CM | POA: Diagnosis not present

## 2023-01-17 DIAGNOSIS — Z0000 Encounter for general adult medical examination without abnormal findings: Secondary | ICD-10-CM | POA: Diagnosis not present

## 2023-01-18 LAB — CBC WITH DIFFERENTIAL/PLATELET
Absolute Lymphocytes: 1982 {cells}/uL (ref 850–3900)
Absolute Monocytes: 400 {cells}/uL (ref 200–950)
Basophils Absolute: 59 {cells}/uL (ref 0–200)
Basophils Relative: 1.1 %
Eosinophils Absolute: 221 {cells}/uL (ref 15–500)
Eosinophils Relative: 4.1 %
HCT: 46 % (ref 38.5–50.0)
Hemoglobin: 15.7 g/dL (ref 13.2–17.1)
MCH: 33.3 pg — ABNORMAL HIGH (ref 27.0–33.0)
MCHC: 34.1 g/dL (ref 32.0–36.0)
MCV: 97.7 fL (ref 80.0–100.0)
MPV: 10 fL (ref 7.5–12.5)
Monocytes Relative: 7.4 %
Neutro Abs: 2738 {cells}/uL (ref 1500–7800)
Neutrophils Relative %: 50.7 %
Platelets: 191 10*3/uL (ref 140–400)
RBC: 4.71 10*6/uL (ref 4.20–5.80)
RDW: 14.1 % (ref 11.0–15.0)
Total Lymphocyte: 36.7 %
WBC: 5.4 10*3/uL (ref 3.8–10.8)

## 2023-01-18 LAB — COMPLETE METABOLIC PANEL WITH GFR
AG Ratio: 1.9 (calc) (ref 1.0–2.5)
ALT: 27 U/L (ref 9–46)
AST: 24 U/L (ref 10–35)
Albumin: 4.3 g/dL (ref 3.6–5.1)
Alkaline phosphatase (APISO): 49 U/L (ref 35–144)
BUN: 15 mg/dL (ref 7–25)
CO2: 30 mmol/L (ref 20–32)
Calcium: 9.9 mg/dL (ref 8.6–10.3)
Chloride: 98 mmol/L (ref 98–110)
Creat: 0.93 mg/dL (ref 0.70–1.28)
Globulin: 2.3 g/dL (ref 1.9–3.7)
Glucose, Bld: 161 mg/dL — ABNORMAL HIGH (ref 65–99)
Potassium: 4 mmol/L (ref 3.5–5.3)
Sodium: 136 mmol/L (ref 135–146)
Total Bilirubin: 0.9 mg/dL (ref 0.2–1.2)
Total Protein: 6.6 g/dL (ref 6.1–8.1)
eGFR: 87 mL/min/{1.73_m2} (ref >=60)

## 2023-01-18 LAB — LIPID PANEL
Cholesterol: 172 mg/dL (ref <200)
HDL: 29 mg/dL — ABNORMAL LOW (ref >=40)
Non-HDL Cholesterol (Calc): 143 mg/dL — ABNORMAL HIGH (ref <130)
Total CHOL/HDL Ratio: 5.9 (calc) — ABNORMAL HIGH (ref <5.0)
Triglycerides: 580 mg/dL — ABNORMAL HIGH (ref <150)

## 2023-01-18 LAB — TESTOSTERONE: Testosterone: 250 ng/dL (ref 250–827)

## 2023-01-18 LAB — HEMOGLOBIN A1C
Hgb A1c MFr Bld: 7.9 %{Hb} — ABNORMAL HIGH (ref <5.7)
Mean Plasma Glucose: 180 mg/dL
eAG (mmol/L): 10 mmol/L

## 2023-01-18 LAB — MICROALBUMIN / CREATININE URINE RATIO
Creatinine, Urine: 61 mg/dL (ref 20–320)
Microalb Creat Ratio: 75 mg/g{creat} — ABNORMAL HIGH (ref <30)
Microalb, Ur: 4.6 mg/dL

## 2023-01-18 LAB — VITAMIN D 25 HYDROXY (VIT D DEFICIENCY, FRACTURES): Vit D, 25-Hydroxy: 17 ng/mL — ABNORMAL LOW (ref 30–100)

## 2023-01-18 LAB — PSA: PSA: 0.73 ng/mL (ref <=4.00)

## 2023-01-18 LAB — VITAMIN B12: Vitamin B-12: 284 pg/mL (ref 200–1100)

## 2023-01-18 LAB — TSH: TSH: 1.85 m[IU]/L (ref 0.40–4.50)

## 2023-01-20 ENCOUNTER — Encounter: Payer: Self-pay | Admitting: Family Medicine

## 2023-01-20 ENCOUNTER — Ambulatory Visit (INDEPENDENT_AMBULATORY_CARE_PROVIDER_SITE_OTHER): Payer: Medicare HMO | Admitting: Family Medicine

## 2023-01-20 VITALS — BP 130/72 | HR 66 | Ht 74.0 in | Wt 264.0 lb

## 2023-01-20 DIAGNOSIS — I10 Essential (primary) hypertension: Secondary | ICD-10-CM | POA: Diagnosis not present

## 2023-01-20 DIAGNOSIS — Z23 Encounter for immunization: Secondary | ICD-10-CM | POA: Diagnosis not present

## 2023-01-20 DIAGNOSIS — E785 Hyperlipidemia, unspecified: Secondary | ICD-10-CM

## 2023-01-20 DIAGNOSIS — E559 Vitamin D deficiency, unspecified: Secondary | ICD-10-CM | POA: Diagnosis not present

## 2023-01-20 DIAGNOSIS — G720 Drug-induced myopathy: Secondary | ICD-10-CM | POA: Insufficient documentation

## 2023-01-20 DIAGNOSIS — E538 Deficiency of other specified B group vitamins: Secondary | ICD-10-CM | POA: Diagnosis not present

## 2023-01-20 DIAGNOSIS — Z0000 Encounter for general adult medical examination without abnormal findings: Secondary | ICD-10-CM

## 2023-01-20 DIAGNOSIS — I2510 Atherosclerotic heart disease of native coronary artery without angina pectoris: Secondary | ICD-10-CM

## 2023-01-20 DIAGNOSIS — E1169 Type 2 diabetes mellitus with other specified complication: Secondary | ICD-10-CM | POA: Diagnosis not present

## 2023-01-20 MED ORDER — VITAMIN D3 125 MCG (5000 UT) PO CAPS: 5000.0000 [IU] | ORAL_CAPSULE | Freq: Every day | ORAL | Status: DC

## 2023-01-20 MED ORDER — REPATHA SURECLICK 140 MG/ML ~~LOC~~ SOAJ: 140.0000 mg | SUBCUTANEOUS | 5 refills | Status: DC

## 2023-01-20 MED ORDER — VITAMIN B-12 1000 MCG PO TABS: 1000.0000 ug | ORAL_TABLET | Freq: Every day | ORAL | Status: DC

## 2023-01-20 NOTE — Progress Notes (Signed)
Kevin Perry (Key: [id number]) PA Case ID #: [id number] Rx #: [id number] Need Help? Call us at [phone] Outcome Approved today by [facility] NCP[employer] 2017 PA Case: [id number], Status: Approved, Coverage Starts on: 02/04/2022 12:00:00 AM, Coverage Ends on: 02/04/2024 12:00:00 AM. Questions? Contact [phone]. Authorization Expiration Date: 02/03/2024 [employer] 140MG /ML auto-injectors e[employer] cloud Engineer, manufacturing systems Electronic PA Form Original Claim Info [id number] 01PA REQD CALL [phone]

## 2023-01-20 NOTE — Progress Notes (Signed)
Subjective:    Patient ID: Kevin Perry, male    DOB: [birth year], 72 y.o.   MRN: [id number]  Kevin Perry is a 72 y.o. male presenting on 01/20/2023 for Annual Exam (Balance concerns)   HPI  Discussed the use of AI scribe software for clinical note transcription with the patient, who gave verbal consent to proceed.   The patient's recent lab results showed slightly elevated blood sugar levels, despite a diligent walking routine. The patient attributed this increase to a decrease in physical activity due to leg pain. The patient expressed a commitment to improving his diet and resuming his walking routine once his leg pain improves. The lab results also showed slightly low levels of vitamin D3 and B12, for which the patient agreed to start over-the-counter supplements.      Bilateral Leg Pain Persistent problem since last visit. We discontinued Statin therapy, and it seems he was actually having drug induced myopathy as it did improve his leg pain off of medicine. The patient reported a decrease in physical activity due to leg pain and balance issues, which have led to several falls. The patient noted that the leg pain improves after moving around in the morning, but it is still a significant concern. The patient also reported a history of arthritis, which may be contributing to the current leg pain and balance issues. -  Since stopping the statin, the patient reported a noticeable improvement in muscle pain, particularly in the mornings    Additionally. He has had prior CT Abd Pelvis that shows degenerative arthritis of lumbar spine S/p R TKR No focal knee pain or symptoms   Still dealing with balance issues. He says needs to hold on to something up and down stairs.   CHRONIC DM, Type 2:  A1c up to 7.9, elevated now due to more sedentary, less activity. Cannot walk as much due to falls and lower extremity pain. On [employer] PAP program for [employer] 3mg  weekly Meds:  Trulicity 3mg  weekly, Metformin 2AM and 1 PM Off Glipizide Reports good compliance. Tolerating well w/o side-effects Currently on ACEi Lifestyle: - Diet (trying to improve diet reduce carb intake) - Exercise (walking limited now) Denies hypoglycemia, polyuria, visual changes, numbness or tingling.   CHRONIC HTN: Reports continues on meds doing well Current Meds - Current meds unchanged   Reports good compliance, took meds today. Tolerating well, w/o complaints. Denies CP, dyspnea, HA, edema, dizziness / lightheadedness   HYPERLIPIDEMIA: Hypertriglyceridemia 580  He did stop the Atorvastatin, but feels much improved on lower extremity pain Still stiff and sore in AM Also Arthritis on imaging previously Asking for new cholesterol option   Health Maintenance:  PSA 0.73 negative     01/20/2023    9:09 AM 12/24/2022    2:14 PM 07/15/2022   10:19 AM  Depression screen PHQ 2/9  Decreased Interest 0 0 0  Down, Depressed, Hopeless 0 0 0  PHQ - 2 Score 0 0 0  Altered sleeping  0 0  Tired, decreased energy  0 1  Change in appetite  0 0  Feeling bad or failure about yourself   0 0  Trouble concentrating  0 0  Moving slowly or fidgety/restless  0 0  Suicidal thoughts  0 0  PHQ-9 Score  0 1  Difficult doing work/chores   Not difficult at all       01/20/2023    9:09 AM 12/24/2022    2:14 PM 07/15/2022   10:20  AM 07/18/2021    8:53 AM  GAD 7 : Generalized Anxiety Score  Nervous, Anxious, on Edge 0 0 0 0  Control/stop worrying 0 0 0 0  Worry too much - different things 0 0 0 0  Trouble relaxing 0 0 0 0  Restless 0 0 0 0  Easily annoyed or irritable 0 0 0 0  Afraid - awful might happen 0 0 0 0  Total GAD 7 Score 0 0 0 0  Anxiety Difficulty   Not difficult at all Not difficult at all     Past Medical History:  Diagnosis Date   Anxiety    Arthritis    Coronary artery disease    Diabetes mellitus without complication (HCC)    Hypertension    Hypertensive retinopathy     Myocardial infarction [facility])    Postoperative nausea 11/27/2019   Past Surgical History:  Procedure Laterality Date   BIOPSY  08/27/2022   Procedure: BIOPSY;  Surgeon: Regis Bill, MD;  Location: [facility] ENDOSCOPY;  Service: Endoscopy;;   CATARACT EXTRACTION     CHOLECYSTECTOMY     COLONOSCOPY WITH PROPOFOL N/A 08/27/2022   Procedure: COLONOSCOPY WITH PROPOFOL;  Surgeon: Regis Bill, MD;  Location: [facility] ENDOSCOPY;  Service: Endoscopy;  Laterality: N/A;   CORONARY ANGIOPLASTY WITH STENT PLACEMENT N/A 02/04/2006   ESOPHAGOGASTRODUODENOSCOPY (EGD) WITH PROPOFOL N/A 08/27/2022   Procedure: ESOPHAGOGASTRODUODENOSCOPY (EGD) WITH PROPOFOL;  Surgeon: Regis Bill, MD;  Location: [facility] ENDOSCOPY;  Service: Endoscopy;  Laterality: N/A;   EYE SURGERY     FOOT SURGERY Left    DOS 8.1.14 HALLIX IPJ FUSION, SECOND MET OSTEOTOMY W/SCREW, HAM TOE REPAIR 2,,4 , PARTIAL AMP 3RD DIGIT    GALLBLADDER SURGERY     HEMOSTASIS CLIP PLACEMENT  08/27/2022   Procedure: HEMOSTASIS CLIP PLACEMENT;  Surgeon: Regis Bill, MD;  Location: [facility] ENDOSCOPY;  Service: Endoscopy;;   KNEE SURERY Right    LEFT HEART CATH AND CORONARY ANGIOGRAPHY Left 11/17/2019   Procedure: LEFT HEART CATH AND CORONARY ANGIOGRAPHY;  Surgeon: Dalia Heading, MD;  Location: [facility];  Service: Cardiovascular;  Laterality: Left;   MYRINGOTOMY WITH TUBE PLACEMENT Right 10/25/2021   Procedure: MYRINGOTOMY WITH BUTTERFLY TUBE PLACEMENT;  Surgeon: Vernie Murders, MD;  [facility];  Service: ENT;  Laterality: Right;   POLYPECTOMY  08/27/2022   Procedure: POLYPECTOMY;  Surgeon: Regis Bill, MD;  Location: [facility];  Service: Endoscopy;;   SUBMUCOSAL LIFTING INJECTION  08/27/2022   Procedure: SUBMUCOSAL LIFTING INJECTION;  Surgeon: Regis Bill, MD;  Location: [facility];  Service: Endoscopy;;  ELEVIEW ASCENDING COLON POLYP   TOTAL KNEE ARTHROPLASTY Right 08/30/2014    Procedure: TOTAL KNEE ARTHROPLASTY;  Surgeon: Kennedy Bucker, MD;  Location: [facility] ORS;  Service: Orthopedics;  Laterality: Right;   Social History   Socioeconomic History   Marital status: Married    Spouse name: Not on file   Number of children: Not on file   Years of education: Not on file   Highest education level: Bachelor's degree (e.g., BA, AB, BS)  Occupational History   Not on file  Tobacco Use   Smoking status: Former    Current packs/day: 0.00    Types: Cigarettes    Quit date: 08/16/2004    Years since quitting: 18.4   Smokeless tobacco: Former    Quit date: 2006   Tobacco comments:    quit 20 plus years  Vaping Use   Vaping status: Never Used  Substance and Sexual Activity   Alcohol use: Not Currently    Alcohol/week: 2.0 standard drinks of alcohol    Types: 2 Cans of beer per week    Comment: SOCIAL   Drug use: No   Sexual activity: Not on file  Other Topics Concern   Not on file  Social History Narrative   Not on file   Social Drivers of Health   Financial Resource Strain: Low Risk  (01/20/2023)   Overall Financial Resource Strain (CARDIA)    Difficulty of Paying Living Expenses: Not hard at all  Food Insecurity: No Food Insecurity (01/20/2023)   Hunger Vital Sign    Worried About Running Out of Food in the Last Year: Never true    Ran Out of Food in the Last Year: Never true  Transportation Needs: No Transportation Needs (01/20/2023)   PRAPARE - Administrator, Civil Service (Medical): No    Lack of Transportation (Non-Medical): No  Physical Activity: Inactive (01/20/2023)   Exercise Vital Sign    Days of Exercise per Week: 0 days    Minutes of Exercise per Session: 60 min  Stress: No Stress Concern Present (01/20/2023)   [employer] of Occupational Health - Occupational Stress Questionnaire    Feeling of Stress : Not at all  Social Connections: Moderately Integrated (01/20/2023)   Social Connection and Isolation Panel [NHANES]     Frequency of Communication with Friends and Family: Twice a week    Frequency of Social Gatherings with Friends and Family: Once a week    Attends Religious Services: More than 4 times per year    Active Member of [employer] or Organizations: No    Attends Banker Meetings: Never    Marital Status: Married  Catering manager Violence: Not At Risk (01/20/2023)   Humiliation, Afraid, Rape, and Kick questionnaire    Fear of Current or Ex-Partner: No    Emotionally Abused: No    Physically Abused: No    Sexually Abused: No   No family history on file. Current Outpatient Medications on File Prior to Visit  Medication Sig   amLODipine (NORVASC) 10 MG tablet TAKE 1 TABLET BY MOUTH ONCE DAILY   aspirin EC 81 MG tablet Take 81 mg by mouth daily. Swallow whole.   baclofen (LIORESAL) 10 MG tablet Take 0.5-1 tablets (5-10 mg total) by mouth 3 (three) times daily as needed for muscle spasms.   buPROPion (WELLBUTRIN XL) 300 MG 24 hr tablet TAKE 1 TABLET BY MOUTH ONCE DAILY ** DOSE INCREASE   escitalopram (LEXAPRO) 20 MG tablet TAKE 1 TABLET BY MOUTH ONCE DAILY AS DIRECTED TAPER AS DIRECTED. OFF ZOLOFT AND ON TO ESCITALOPRAM   folic acid (FOLVITE) 1 MG tablet Take 1 mg by mouth See admin instructions. Take 1 mg daily except skip dose on Wednesdays (methotrexate day)   furosemide (LASIX) 40 MG tablet TAKE 1 TABLET BY MOUTH ONCE DAILY   gabapentin (NEURONTIN) 100 MG capsule Take 1 capsule (100 mg total) by mouth 2 (two) times daily.   lisinopril (ZESTRIL) 20 MG tablet Take 1 tablet by mouth 2 (two) times daily.   loratadine (CLARITIN) 10 MG tablet Take 10 mg by mouth every morning.   metFORMIN (GLUCOPHAGE) 500 MG tablet TAKE 2 TABLETS BY MOUTH ONCE EVERY MORNING WITH BREAKFAST AND 1 TABLET ONCEEVERY EVENING WITH DINNER   methotrexate (RHEUMATREX) 2.5 MG tablet Take 10 mg by mouth once a week. On Wednesdays   metoprolol tartrate (LOPRESSOR) 25 MG  tablet TAKE 1 TABLET BY MOUTH TWICE DAILY    nitroGLYCERIN (NITROSTAT) 0.4 MG SL tablet Place under the tongue.   TRULICITY 3 MG/0.5ML SOPN INJECT 3 MG (0.5 ML) UNDER THE SKIN ONCE A WEEK   No current facility-administered medications on file prior to visit.    Review of Systems  Constitutional:  Negative for activity change, appetite change, chills, diaphoresis, fatigue and fever.  HENT:  Negative for congestion and hearing loss.   Eyes:  Negative for visual disturbance.  Respiratory:  Negative for cough, chest tightness, shortness of breath and wheezing.   Cardiovascular:  Negative for chest pain, palpitations and leg swelling.  Gastrointestinal:  Negative for abdominal pain, constipation, diarrhea, nausea and vomiting.  Genitourinary:  Negative for dysuria, frequency and hematuria.  Musculoskeletal:  Positive for arthralgias and gait problem. Negative for neck pain.  Skin:  Negative for rash.  Neurological:  Negative for dizziness, weakness, light-headedness, numbness and headaches.  Hematological:  Negative for adenopathy.  Psychiatric/Behavioral:  Negative for behavioral problems, dysphoric mood and sleep disturbance.    Per HPI unless specifically indicated above     Objective:    BP 130/72   Pulse 66   Ht 6\' 2"  (1.88 m)   Wt 264 lb (119.7 kg)   BMI 33.90 kg/m   Wt Readings from Last 3 Encounters:  01/20/23 264 lb (119.7 kg)  12/24/22 262 lb (118.8 kg)  10/18/22 257 lb (116.6 kg)    Physical Exam Vitals and nursing note reviewed.  Constitutional:      General: He is not in acute distress.    Appearance: He is well-developed. He is not diaphoretic.     Comments: Well-appearing, comfortable, cooperative  HENT:     Head: Normocephalic and atraumatic.  Eyes:     General:        Right eye: No discharge.        Left eye: No discharge.     Conjunctiva/sclera: Conjunctivae normal.     Pupils: Pupils are equal, round, and reactive to light.  Neck:     Thyroid: No thyromegaly.  Cardiovascular:     Rate and  Rhythm: Normal rate and regular rhythm.     Pulses: Normal pulses.     Heart sounds: Normal heart sounds. No murmur heard. Pulmonary:     Effort: Pulmonary effort is normal. No respiratory distress.     Breath sounds: Normal breath sounds. No wheezing or rales.  Abdominal:     General: Bowel sounds are normal. There is no distension.     Palpations: Abdomen is soft. There is no mass.     Tenderness: There is no abdominal tenderness.  Musculoskeletal:        General: No tenderness. Normal range of motion.     Cervical back: Normal range of motion and neck supple.     Comments: Upper / Lower Extremities: - Normal muscle tone, strength bilateral upper extremities 5/5, lower extremities 5/5  Lymphadenopathy:     Cervical: No cervical adenopathy.  Skin:    General: Skin is warm and dry.     Findings: No erythema or rash.  Neurological:     Mental Status: He is alert and oriented to person, place, and time.     Comments: Distal sensation intact to light touch all extremities  Psychiatric:        Mood and Affect: Mood normal.        Behavior: Behavior normal.        Thought Content:  Thought content normal.     Comments: Well groomed, good eye contact, normal speech and thoughts     Results for orders placed or performed in visit on 01/13/23  VITAMIN D 25 Hydroxy (Vit-D Deficiency, Fractures)   Collection Time: 01/17/23  9:17 AM  Result Value Ref Range   Vit D, 25-Hydroxy 17 (L) 30 - 100 ng/mL  Vitamin B12   Collection Time: 01/17/23  9:17 AM  Result Value Ref Range   Vitamin B-12 284 200 - 1,100 pg/mL  Testosterone   Collection Time: 01/17/23  9:17 AM  Result Value Ref Range   Testosterone 250 250 - 827 ng/dL  Microalbumin / creatinine urine ratio   Collection Time: 01/17/23  9:17 AM  Result Value Ref Range   Creatinine, Urine 61 20 - 320 mg/dL   Microalb, Ur 4.6 mg/dL   Microalb Creat Ratio 75 (H) <30 mg/g creat  PSA   Collection Time: 01/17/23  9:17 AM  Result Value Ref  Range   PSA 0.73 < OR = 4.00 ng/mL  CBC with Differential/Platelet   Collection Time: 01/17/23  9:17 AM  Result Value Ref Range   WBC 5.4 3.8 - 10.8 Thousand/uL   RBC 4.71 4.20 - 5.80 Million/uL   Hemoglobin 15.7 13.2 - 17.1 g/dL   HCT 57.8 46.9 - 62.9 %   MCV 97.7 80.0 - 100.0 fL   MCH 33.3 (H) 27.0 - 33.0 pg   MCHC 34.1 32.0 - 36.0 g/dL   RDW 52.8 41.3 - 24.4 %   Platelets 191 140 - 400 Thousand/uL   MPV 10.0 7.5 - 12.5 fL   Neutro Abs 2,738 1,500 - 7,800 cells/uL   Absolute Lymphocytes 1,982 850 - 3,900 cells/uL   Absolute Monocytes 400 200 - 950 cells/uL   Eosinophils Absolute 221 15 - 500 cells/uL   Basophils Absolute 59 0 - 200 cells/uL   Neutrophils Relative % 50.7 %   Total Lymphocyte 36.7 %   Monocytes Relative 7.4 %   Eosinophils Relative 4.1 %   Basophils Relative 1.1 %  Lipid panel   Collection Time: 01/17/23  9:17 AM  Result Value Ref Range   Cholesterol 172 <200 mg/dL   HDL 29 (L) > OR = 40 mg/dL   Triglycerides 010 (H) <150 mg/dL   LDL Cholesterol (Calc)  mg/dL (calc)   Total CHOL/HDL Ratio 5.9 (H) <5.0 (calc)   Non-HDL Cholesterol (Calc) 143 (H) <130 mg/dL (calc)  Hemoglobin U7O   Collection Time: 01/17/23  9:17 AM  Result Value Ref Range   Hgb A1c MFr Bld 7.9 (H) <5.7 % of total Hgb   Mean Plasma Glucose 180 mg/dL   eAG (mmol/L) 53.6 mmol/L  COMPLETE METABOLIC PANEL WITH GFR   Collection Time: 01/17/23  9:17 AM  Result Value Ref Range   Glucose, Bld 161 (H) 65 - 99 mg/dL   BUN 15 7 - 25 mg/dL   Creat 6.44 0.34 - 7.42 mg/dL   eGFR 87 > OR = 60 VZ/DGL/8.75I4   BUN/Creatinine Ratio SEE NOTE: 6 - 22 (calc)   Sodium 136 135 - 146 mmol/L   Potassium 4.0 3.5 - 5.3 mmol/L   Chloride 98 98 - 110 mmol/L   CO2 30 20 - 32 mmol/L   Calcium 9.9 8.6 - 10.3 mg/dL   Total Protein 6.6 6.1 - 8.1 g/dL   Albumin 4.3 3.6 - 5.1 g/dL   Globulin 2.3 1.9 - 3.7 g/dL (calc)   AG Ratio 1.9 1.0 - 2.5 (  calc)   Total Bilirubin 0.9 0.2 - 1.2 mg/dL   Alkaline phosphatase  (APISO) 49 35 - 144 U/L   AST 24 10 - 35 U/L   ALT 27 9 - 46 U/L  TSH   Collection Time: 01/17/23  9:17 AM  Result Value Ref Range   TSH 1.85 0.40 - 4.50 mIU/L      Assessment & Plan:   Problem List Items Addressed This Visit     Coronary artery disease involving native coronary artery of native heart without angina pectoris   Relevant Medications   REPATHA SURECLICK 140 MG/ML SOAJ   Drug-induced myopathy   Essential hypertension   Relevant Medications   REPATHA SURECLICK 140 MG/ML SOAJ   Hyperlipidemia associated with type 2 diabetes mellitus (HCC)   Relevant Medications   REPATHA SURECLICK 140 MG/ML SOAJ   Type 2 diabetes mellitus with other specified complication (HCC)   Other Visit Diagnoses       Annual physical exam    -  Primary     Need for Streptococcus pneumoniae vaccination       Relevant Orders   Pneumococcal conjugate vaccine 20-valent (Completed)     Vitamin D deficiency       Relevant Medications   Cholecalciferol (VITAMIN D3) 125 MCG (5000 UT) CAPS     Vitamin B12 deficiency            Updated Health Maintenance information Reviewed recent lab results with patient Encouraged improvement to lifestyle with diet and exercise Goal of weight loss   Hyperlipidemia Drug Induced Myopathy Elevated triglycerides and unknown LDL due to high triglycerides. Patient has history of muscle pain with statin use. Discussed alternative options including Zetia and Repatha. Patient prefers Repatha. -Order Repatha and monitor for insurance approval. -Check lipid panel after starting Repatha.  Type 2 Diabetes A1c  elevated to 7.9, no longer in control Slightly elevated blood glucose. Patient reports decreased physical activity due to leg pain. -Encourage diet modification and increased physical activity as tolerated. Continue Trulicity 3mg  weekly inj  Vitamin D and B12 deficiency Slightly low levels of Vitamin D and B12. Discussed supplementation options. -Start  Vitamin D 5000 IU daily for 3 months, then decrease to 2000 IU daily. -Start Vitamin B12 1000 mcg daily.  Musculoskeletal pain Patient reports improvement in leg pain after discontinuing statin. Discussed potential referral for further evaluation if no further improvement. -Consider referral to orthopedics or spine specialist if no further improvement in leg pain and balance. Reconsider referral to Ortho or Spine or MRI if not improving.  Pneumococcal vaccination Due for updated pneumococcal vaccination. -Administer updated pneumococcal vaccination today.  Follow-up in 6 months or sooner if needed.      Fax all lab results to [facility] [address] [place], [place] [address] Phone: [phone] Fax: [phone]   ____________________________________________________ Additional Rx Information (May be used for Prior Authorization if required)  Medication name and Strength: Reptha 140mg /mL injection  Primary Diagnosis and ICD10 Code: Coronary Artery Disease (I25.10) Secondary Diagnosis and ICD10 Code: n/a Previous Failed Medications Atorvastatin 40mg  Quantity and Duration of New Medication: 2 mL for 28 days Additional Supporting Information: Please do not use any High Cholesterol codes on the Prior Auth. It should only be associated with Coronary Artery Disease. ____________________________________________________     Orders Placed This Encounter  Procedures   Pneumococcal conjugate vaccine 20-valent    Meds ordered this encounter  Medications   REPATHA SURECLICK 140  MG/ML SOAJ    Sig: Inject 140 mg into the skin every 14 (fourteen) days.    Dispense:  2 mL    Refill:  5   Cholecalciferol (VITAMIN D3) 125 MCG (5000 UT) CAPS    Sig: Take 1 capsule (5,000 Units total) by mouth daily. For 8 weeks, then start Vitamin D3 2,000 units daily (OTC)   cyanocobalamin (VITAMIN B12) 1000 MCG tablet    Sig: Take 1 tablet (1,000 mcg total) by  mouth daily.     Follow up plan: Return in about 6 months (around 07/21/2023) for 6 month DM A1c.  Saralyn Pilar, DO [facility] 01/20/2023, 9:25 AM

## 2023-01-20 NOTE — Patient Instructions (Addendum)
Thank you for coming to the office today.  Low Vitamin D 17 Suspected contributing to symptoms of low energy Start OTC Vitamin D3 5,000 iu daily for 12 weeks then reduce to OTC Vitamin D3 2,000 iu daily for maintenance  B12 is borderline low. Result is 284 B12 Recommend dose 1,000 mcg daily OTC Supplement.  Agree with cane use.   Reconsider referral to Ortho or Spine or MRI if not improving.   Recent Labs    07/15/22 1027 01/17/23 0917  HGBA1C 6.8* 7.9*   Lipid Panel     Component Value Date/Time   CHOL 172 01/17/2023 0917   CHOL 153 07/27/2012 1335   TRIG 580 (H) 01/17/2023 0917   TRIG 212 (H) 07/27/2012 1335   HDL 29 (L) 01/17/2023 0917   HDL 34 (L) 07/27/2012 1335   CHOLHDL 5.9 (H) 01/17/2023 0917   VLDL 29 09/19/2015 1335   VLDL 42 (H) 07/27/2012 1335   LDLCALC  01/17/2023 0917     Comment:     . LDL cholesterol not calculated. Triglyceride levels greater than 400 mg/dL invalidate calculated LDL results. . Reference range: <100 . Desirable range <100 mg/dL for primary prevention;   <70 mg/dL for patients with CHD or diabetic patients  with > or = 2 CHD risk factors. Marland Kitchen LDL-C is now calculated using the Martin-Hopkins  calculation, which is a validated novel method providing  better accuracy than the Friedewald equation in the  estimation of LDL-C.  Horald Pollen et al. Lenox Ahr. 4782;956(21): 2061-2068  (http://education.[facility].com/faq/FAQ164)    LDLCALC 77 07/27/2012 1335   --------------------  Ordered Repatha injection for cholesterol, every 2 week injection Stay off Atorvastatin    Please schedule a Follow-up Appointment to: Return in about 6 months (around 07/21/2023) for 6 month DM A1c.  If you have any other questions or concerns, please feel free to call the office or send a message through MyChart. You may also schedule an earlier appointment if necessary.  Additionally, you may be receiving a survey about your experience at our office  within a few days to 1 week by e-mail or mail. We value your feedback.  Saralyn Pilar, DO [facility], [place]

## 2023-01-21 ENCOUNTER — Telehealth: Payer: Self-pay | Admitting: Pharmacy Technician

## 2023-01-21 DIAGNOSIS — Z5986 Financial insecurity: Secondary | ICD-10-CM

## 2023-01-21 NOTE — Progress Notes (Signed)
Pharmacy Medication Assistance Program Note    01/21/2023  Patient ID: Kevin Perry, male   DOB: [birth year], 72 y.o.   MRN: [id number]     12/23/2022 01/21/2023  Outreach Medication One  Initial Outreach Date (Medication One) 12/23/2022   Manufacturer [employer]ication [employer] Trulicity   Dose of Trulicity 3mg /0.82ml   Type of Radiographer, therapeutic Assistance   Date Application Sent to Patient 12/25/2022   Application Items Requested Application;Proof of Income;Other   Date Application Sent to Prescriber 12/25/2022   Name of Prescriber Saralyn Pilar   Date Application Received From Patient  01/21/2023  Application Items Received From Patient  Application;Proof of Income;Other  Date Application Received From Provider  12/25/2022  Date Application Submitted to Manufacturer  01/21/2023  Method Application Sent to Manufacturer  Fax     Signature   [name] [facility]  Office: [phone] Fax: [phone] Email: [email or web address]

## 2023-01-22 NOTE — Progress Notes (Signed)
Patient is approved for Trulicity until the end of calendar year 2025

## 2023-02-01 ENCOUNTER — Other Ambulatory Visit: Payer: Self-pay | Admitting: Family Medicine

## 2023-02-01 DIAGNOSIS — F331 Major depressive disorder, recurrent, moderate: Secondary | ICD-10-CM

## 2023-02-03 ENCOUNTER — Other Ambulatory Visit: Payer: Self-pay | Admitting: Internal Medicine

## 2023-02-03 DIAGNOSIS — R600 Localized edema: Secondary | ICD-10-CM

## 2023-02-03 DIAGNOSIS — I5033 Acute on chronic diastolic (congestive) heart failure: Secondary | ICD-10-CM | POA: Diagnosis not present

## 2023-02-04 ENCOUNTER — Ambulatory Visit
Admission: RE | Admit: 2023-02-04 | Discharge: 2023-02-04 | Disposition: A | Discharge status: Home / Self Care | Payer: Medicare HMO | Source: Ambulatory Visit | Attending: Internal Medicine | Admitting: Internal Medicine

## 2023-02-04 DIAGNOSIS — R600 Localized edema: Secondary | ICD-10-CM | POA: Insufficient documentation

## 2023-02-04 DIAGNOSIS — M7122 Synovial cyst of popliteal space [Baker], left knee: Secondary | ICD-10-CM | POA: Diagnosis not present

## 2023-02-04 DIAGNOSIS — M79605 Pain in left leg: Secondary | ICD-10-CM | POA: Diagnosis not present

## 2023-02-06 NOTE — Telephone Encounter (Signed)
 Requested Prescriptions  Pending Prescriptions Disp Refills   escitalopram  (LEXAPRO ) 20 MG tablet [Pharmacy Med Name: ESCITALOPRAM  OXALATE 20 MG TAB] 90 tablet 1    Sig: TAKE 1 TABLET BY MOUTH ONCE DAILY AS DIRECTED TAPER AS DIRECTED. OFF ZOLOFT  AND ON TO ESCITALOPRAM      Psychiatry:  Antidepressants - SSRI Passed - 02/06/2023 12:15 PM      Passed - Completed PHQ-2 or PHQ-9 in the last 360 days      Passed - Valid encounter within last 6 months    Recent Outpatient Visits           2 weeks ago Annual physical exam   [place] [facility] [place], Marsa PARAS, DO   1 month ago Bilateral thigh pain   [place] [facility] [place], Marsa PARAS, DO   1 month ago Type 2 diabetes mellitus with other specified complication, without long-term current use of insulin  [facility])   [place] [facility] Delles, Sharyle A, RPH-CPP   3 months ago Need for influenza vaccination   [facility] [place], [place] W, NP   6 months ago Type 2 diabetes mellitus with other specified complication, without long-term current use of insulin  [facility])   [place] [facility] Edman, Marsa PARAS, DO       Future Appointments             In 5 months Edman, Marsa PARAS, DO [facility], [facility]

## 2023-02-10 DIAGNOSIS — I5033 Acute on chronic diastolic (congestive) heart failure: Secondary | ICD-10-CM | POA: Diagnosis not present

## 2023-02-17 ENCOUNTER — Telehealth: Payer: Self-pay | Admitting: Pharmacy Technician

## 2023-02-17 DIAGNOSIS — E782 Mixed hyperlipidemia: Secondary | ICD-10-CM | POA: Diagnosis not present

## 2023-02-17 DIAGNOSIS — I2510 Atherosclerotic heart disease of native coronary artery without angina pectoris: Secondary | ICD-10-CM | POA: Diagnosis not present

## 2023-02-17 DIAGNOSIS — I5033 Acute on chronic diastolic (congestive) heart failure: Secondary | ICD-10-CM | POA: Diagnosis not present

## 2023-02-17 DIAGNOSIS — E118 Type 2 diabetes mellitus with unspecified complications: Secondary | ICD-10-CM | POA: Diagnosis not present

## 2023-02-17 DIAGNOSIS — Z5986 Financial insecurity: Secondary | ICD-10-CM

## 2023-02-17 DIAGNOSIS — I10 Essential (primary) hypertension: Secondary | ICD-10-CM | POA: Diagnosis not present

## 2023-02-17 NOTE — Progress Notes (Signed)
 Pharmacy Medication Assistance Program Note    02/17/2023  Patient ID: Kevin Perry, male   DOB: 17-Jul-1950, 73 y.o.   MRN: [id number]     12/23/2022 01/21/2023 02/17/2023  Outreach Medication One  Initial Outreach Date (Medication One) 12/23/2022    Manufacturer Medication One [employer] Trulicity     Dose of Trulicity  3mg /0.93ml    Type of Radiographer, Therapeutic Assistance    Date Application Sent to Patient 12/25/2022    Application Items Requested Application;Proof of Income;Other    Date Application Sent to Prescriber 12/25/2022    Name of Prescriber Marsa Officer    Date Application Received From Patient  01/21/2023   Application Items Received From Patient  Application;Proof of Income;Other   Date Application Received From Provider  12/25/2022   Date Application Submitted to Manufacturer  01/21/2023   Method Application Sent to Manufacturer  Fax   Patient Assistance Determination   Approved  Approval Start Date   02/05/2023  Approval End Date   02/04/2024  Additional Outreach Contact   Provider  Contacted Provider   Message    Care coordination call placed to Lilly in regard to Truicity application. Spoke to Harvey who informs patient is APPROVED 02/05/23-02/04/24. Medication should auto fill and ship to patient's home address on the application. Patient may call Lilly at any time to check on his next shipment by calling [phone]. Will send in basket to [facility] Pharm[facility] to discuss at his next office visit with her.  Signature  Kate Marzette Sola [facility]  Office: [phone] Fax: [phone] Email: [email or web address]

## 2023-02-24 ENCOUNTER — Encounter: Payer: Self-pay | Admitting: Pharmacist

## 2023-02-24 ENCOUNTER — Other Ambulatory Visit: Payer: Medicare HMO | Admitting: Pharmacist

## 2023-02-24 DIAGNOSIS — I10 Essential (primary) hypertension: Secondary | ICD-10-CM

## 2023-02-24 DIAGNOSIS — E1169 Type 2 diabetes mellitus with other specified complication: Secondary | ICD-10-CM

## 2023-02-24 NOTE — Patient Instructions (Signed)
Goals Addressed             This Visit's Progress    Pharmacy Goals       You were pre-approved for the [employer] Hypercholesterolemia - Medicare Access grant.    You can contact [employer] at:   [email or web address] [phone]   Kennedy Bucker Information:   Enrollment Period: 01/25/2023 through 01/24/2024 Kennedy Bucker Amount: $2,500.00 [employer] Identification Number: [id number]   Pharmacy Billing Info:   ID: [id number] Rx BIN: [id number] Rx PCN: [id number] Group: [id number] Pharmacy Help Desk: [phone]   Please reach out to [facility] for further questions about this grant. You can give me or your provider a call for further questions about your medications.   Our goal A1c is less than 7%. This corresponds with fasting sugars less than 130 and 2 hour after meal sugars less than 180. Please check your blood sugar and keep log of the results   Please check your home blood pressure, keep a log of the results and have this to review during our next appointment.  Feel free to call me with any questions or concerns. I look forward to our next call!   Estelle Grumbles, PharmD, [facility] Clinical Pharmacist [facility] [phone]

## 2023-02-24 NOTE — Progress Notes (Signed)
02/24/2023 Name: Kevin Perry MRN: [id number] DOB: 12/02/1950  Chief Complaint  Patient presents with   Medication Assistance   Medication Management    Kevin Perry is a 73 y.o. year old male who presented for a telephone visit.   They were referred to the pharmacist by their PCP for assistance in managing diabetes, hypertension, hyperlipidemia, and medication access.      Subjective:   Care Team: Primary Care Provider: Smitty Cords, DO; Next Scheduled Visit: 08/04/2023 Cardiologist: Clotilde Dieter, DO; Next Scheduled Visit: 05/22/2023 Gastroenterology: Darryl Lent, MD ; Next Scheduled Visit: 04/17/2023    Medication Access/Adherence  Current Pharmacy:  [facility] [place], [place] - [address] [place] [address] Phone: [phone] Fax: [phone]  [facility] [address] [place] - [address] [place] [address] Phone: [phone] Fax: [phone]  [facility] - [place], [place] - [facility] AT Portal to Registered [address] One  [place] [address] Phone: [phone] Fax: [phone]  [facility] - [place], [place] - [address] [place] [address] Phone: [phone] Fax: [phone]   Patient reports affordability concerns with their medications: Yes Patient reports access/transportation concerns to their pharmacy: No  Patient reports adherence concerns with their medications:  No     Reports that the cost of his Repatha copayment is difficult to afford through his Medicare prescription plan  Reports cost of new Jardiance prescription is affordable, but will let clinical pharmacist know if assistance needed in the future  Diabetes:   Current medications:  Metformin IR 500mg  x 2 in AM / x 1 in PM Trulicity 3 mg weekly Jardiance 25 mg daily (Reports started  by Cardiologist on 02/03/2023)  Medications tried in the past: glipizide     Morning fasting glucose recently ranging 140-168  Reports working on cutting back on portion sizes of bread   Patient denies hypoglycemic s/sx including dizziness, shakiness, sweating.     Current physical activity: starting back to walking gradually now that swelling in leg improved   Current medication access support: Enrolled for Trulicity from [facility] Patient Assistance Program through 02/04/2024   Hypertension:   Current medications:  Amlodipine 10mg  daily Furosemide 40 mg twice daily Lisinopril 20 mg twice daily Metoprolol tartrate 25 mg twice daily   Patient has an automated, upper arm home BP cuff   Reports home blood pressure reading today: 124/72   Current physical activity: starting back to walking gradually now that swelling in leg improved    Hyperlipidemia/ASCVD Risk Reduction  Current lipid lowering medications: Repatha Sureclick 140 mg every 14 days      Note Repatha PA approved through 02/04/2024   Medications tried in the past: atorvastatin (myopathy)  Antiplatelet regimen: aspirin 81 mg daily  ASCVD History: CAD with prior CABG and PCI   Current physical activity: starting back to walking gradually now that swelling in leg improved    Objective:  Lab Results  Component Value Date   HGBA1C 7.9 (H) 01/17/2023    Lab Results  Component Value Date   CREATININE 0.93 01/17/2023   BUN 15 01/17/2023   NA 136 01/17/2023   K 4.0 01/17/2023   CL 98 01/17/2023   CO2 30 01/17/2023    Lab Results  Component Value Date   CHOL 172 01/17/2023   HDL 29 (L) 01/17/2023   LDLCALC  01/17/2023  Comment:     . LDL cholesterol not calculated. Triglyceride levels greater than 400 mg/dL invalidate calculated LDL results. . Reference range: <100 . Desirable range <100 mg/dL for primary prevention;   <70 mg/dL for patients with CHD or diabetic patients  with > or = 2 CHD  risk factors. Marland Kitchen LDL-C is now calculated using the Martin-Hopkins  calculation, which is a validated novel method providing  better accuracy than the Friedewald equation in the  estimation of LDL-C.  Horald Pollen et al. Lenox Ahr. 1610;960(45): 2061-2068  (http://education.Quest[employer]ostics.com/faq/FAQ164)    TRIG 580 (H) 01/17/2023   CHOLHDL 5.9 (H) 01/17/2023    Medications Reviewed Today     Reviewed by Manuela Neptune, RPH-CPP (Pharmacist) on 02/24/23 at 1441  Med List Status: <None>   Medication Order Taking? Sig Documenting Provider Last Dose Status Informant  amLODipine (NORVASC) 10 MG tablet [id number] Yes TAKE 1 TABLET BY MOUTH ONCE DAILY Smitty Cords, DO Taking Active   aspirin EC 81 MG tablet [id number]  Take 81 mg by mouth daily. Swallow whole. [provider]  Active   baclofen (LIORESAL) 10 MG tablet [id number]  Take 0.5-1 tablets (5-10 mg total) by mouth 3 (three) times daily as needed for muscle spasms. Smitty Cords, DO  Active   buPROPion (WELLBUTRIN XL) 300 MG 24 hr tablet [id number]  TAKE 1 TABLET BY MOUTH ONCE DAILY DOSE INCREASE Baity, Salvadore Oxford, NP  Active   Cholecalciferol (VITAMIN D3) 125 MCG (5000 UT) CAPS [id number]  Take 1 capsule (5,000 Units total) by mouth daily. For 8 weeks, then start Vitamin D3 2,000 units daily (OTC) Karamalegos, Netta Neat, DO  Active   cyanocobalamin (VITAMIN B12) 1000 MCG tablet [id number]  Take 1 tablet (1,000 mcg total) by mouth daily. Karamalegos, Netta Neat, DO  Active   empagliflozin (JARDIANCE) 25 MG TABS tablet [id number] Yes Take 1 tablet by mouth daily. [provider] Taking Active   escitalopram (LEXAPRO) 20 MG tablet [id number]  TAKE 1 TABLET BY MOUTH ONCE DAILY AS DIRECTED TAPER AS DIRECTED. OFF ZOLOFT AND ON TO ESCITALOPRAM Smitty Cords, DO  Active   folic acid (FOLVITE) 1 MG tablet [id number]  Take 1 mg by mouth See admin instructions. Take 1 mg daily except skip dose on Wednesdays  (methotrexate day) [provider]  Active Self  furosemide (LASIX) 40 MG tablet [id number]  TAKE 1 TABLET BY MOUTH ONCE DAILY  Patient taking differently: Take 40 mg by mouth 2 (two) times daily.   Karamalegos, Netta Neat, DO  Active   gabapentin (NEURONTIN) 100 MG capsule [id number]  Take 1 capsule (100 mg total) by mouth 2 (two) times daily. Lorre Munroe, NP  Active   lisinopril (ZESTRIL) 20 MG tablet [id number] Yes Take 1 tablet by mouth 2 (two) times daily. [provider] Taking Active   loratadine (CLARITIN) 10 MG tablet [id number]  Take 10 mg by mouth every morning. [provider]  Active Self  metFORMIN (GLUCOPHAGE) 500 MG tablet [id number] Yes TAKE 2 TABLETS BY MOUTH ONCE EVERY MORNING WITH BREAKFAST AND 1 TABLET ONCEEVERY EVENING WITH DINNER Karamalegos, Netta Neat, DO Taking Active   methotrexate (RHEUMATREX) 2.5 MG tablet [id number]  Take 10 mg by mouth once a week. On Wednesdays [provider]  Active   metoprolol tartrate (LOPRESSOR) 25 MG tablet [id number] Yes TAKE 1 TABLET BY MOUTH TWICE DAILY Karamalegos, Netta Neat, DO Taking Active   nitroGLYCERIN (NITROSTAT) 0.4 MG SL tablet [id number]  Place under the tongue. [provider]  Active   REPATHA SURECLICK 140 MG/ML SOAJ [id number] Yes Inject 140 mg into the skin every 14 (fourteen) days. Smitty Cords, DO Taking Active   TRULICITY 3 MG/0.5ML Namon Cirri [id number] Yes INJECT 3 MG (0.5 ML) UNDER THE SKIN ONCE A WEEK Karamalegos, Netta Neat, DO Taking Active               Assessment/Plan:   Diabetes: - Counseled on importance of having well-balanced meals, while controlling carbohydrate portion sizes - Recommend to continue to check glucose and keep a log of the results   Hypertension: - Have reviewed appropriate blood pressure monitoring technique and reviewed goal blood pressure.  - Recommend patient to continue to monitor home BP, keep log of results including HR  readings and bring this record to medical appointments     Hyperlipidemia/ASCVD Risk Reduction: - Assist patient with enrolling in [employer] Hypercholesteremia- Medicare Access grant - patient approved through 01/24/2024 Send patient Healthwell grant billing information via MyChart  Note patient not currently due for refill of Repatha Call to provide grant billing information to patient's pharmacy to use for future refills Patient aware to provide to pharmacy and advise pharmacy to bill remaining balance of cost for Repatha prescription to [employer] grant ONLY AFTER billed to Medicare    Follow Up Plan: Clinical Pharmacist will follow up with patient by telephone on 04/21/2023 at 1:00 PM   Estelle Grumbles, PharmD, [place]y [place], CPP Clinical Pharmacist [facility] [phone]

## 2023-03-04 ENCOUNTER — Encounter: Payer: Self-pay | Admitting: Podiatry

## 2023-03-04 ENCOUNTER — Ambulatory Visit: Payer: Self-pay | Admitting: Podiatry

## 2023-03-04 VITALS — Ht 74.0 in | Wt 264.0 lb

## 2023-03-04 DIAGNOSIS — L97522 Non-pressure chronic ulcer of other part of left foot with fat layer exposed: Secondary | ICD-10-CM

## 2023-03-04 DIAGNOSIS — E0843 Diabetes mellitus due to underlying condition with diabetic autonomic (poly)neuropathy: Secondary | ICD-10-CM

## 2023-03-04 NOTE — Progress Notes (Signed)
  Subjective:  Patient ID: Kevin Perry, male    DOB: [birth year],  MRN: [id number]  Chief Complaint  Patient presents with   Foot Ulcer    Pt is here due to ulcer on his second toe on the left foot, pt states no injury to foot, wound began about a few weeks ago.    73 y.o. male presents with the above complaint.  Patient presents with left second digit ulceration has progressive gotten worse worse with ambulation worse with pressure he has not seen MRIs prior to seeing me he would like to discuss treatment options for it.  Denies any injury to the area came on about few weeks ago.  0 out of 10 pain scale he is a diabetic   Review of Systems: Negative except as noted in the HPI. Denies N/V/F/Ch.  Past Medical History:  Diagnosis Date   Anxiety    Arthritis    Coronary artery disease    Diabetes mellitus without complication (HCC)    Hypertension    Hypertensive retinopathy    Myocardial infarction [facility])    Postoperative nausea 11/27/2019     Social History   Tobacco Use  Smoking Status Former   Current packs/day: 0.00   Types: Cigarettes   Quit date: 08/16/2004   Years since quitting: 18.5  Smokeless Tobacco Former   Quit date: 2006  Tobacco Comments   quit 20 plus years    Allergies  Allergen Reactions   Naproxen Swelling and Anaphylaxis   Objective:  There were no vitals filed for this visit. Body mass index is 33.9 kg/m. Constitutional Well developed. Well nourished.  Vascular Dorsalis pedis pulses palpable bilaterally. Posterior tibial pulses palpable bilaterally. Capillary refill normal to all digits.  No cyanosis or clubbing noted. Pedal hair growth normal.  Neurologic Normal speech. Oriented to person, place, and time. Epicritic sensation to light touch grossly present bilaterally.  Dermatologic Left second digit ulceration with fat layer exposed.  Fibrogranular wound bed noted no exposure of bone noted.  No purulent drainage noted.  No erythema or  cellulitis noted  Orthopedic: Normal joint ROM without pain or crepitus bilaterally. No visible deformities. No bony tenderness.   Radiographs: None Assessment:   1. Toe ulcer, left, with fat layer exposed (HCC)   2. Diabetes mellitus due to underlying condition with diabetic autonomic neuropathy, unspecified whether long term insulin use (HCC)    Plan:  Patient was evaluated and treated and all questions answered.  Left second left second digit ulceration with fat layer exposed with history of diabetes -All questions and concerns were discussed with the patient in extensive detail -Patient will benefit from Betadine wet-to-dry dressing and encouraged him to do once a day every single day.  He states understanding I discussed shoe gear modification as well.  Patient is a high risk of digital amputation.  No follow-ups on file.

## 2023-03-10 ENCOUNTER — Other Ambulatory Visit: Payer: Self-pay | Admitting: Internal Medicine

## 2023-03-10 DIAGNOSIS — M170 Bilateral primary osteoarthritis of knee: Secondary | ICD-10-CM

## 2023-03-11 NOTE — Telephone Encounter (Signed)
 Requested Prescriptions  Pending Prescriptions Disp Refills   gabapentin  (NEURONTIN ) 100 MG capsule [Pharmacy Med Name: GABAPENTIN  100 MG CAP] 180 capsule 0    Sig: TAKE 1 CAPSULE BY MOUTH TWICE DAILY     Neurology: Anticonvulsants - gabapentin  Passed - 03/11/2023  2:15 PM      Passed - Cr in normal range and within 360 days    Creat  Date Value Ref Range Status  01/17/2023 0.93 0.70 - 1.28 mg/dL Final   Creatinine, POC  Date Value Ref Range Status  03/09/2015 0 mg/dL Final   Creatinine, Urine  Date Value Ref Range Status  01/17/2023 61 20 - 320 mg/dL Final         Passed - Completed PHQ-2 or PHQ-9 in the last 360 days      Passed - Valid encounter within last 12 months    Recent Outpatient Visits           1 month ago Annual physical exam   [place] [facility] [place], Marsa PARAS, DO   2 months ago Bilateral thigh pain   [place] [facility] [place], Marsa PARAS, DO   3 months ago Type 2 diabetes mellitus with other specified complication, without long-term current use of insulin  [facility])   [place] [facility] Delles, Sharyle A, RPH-CPP   4 months ago Need for influenza vaccination   [place] [facility] [place], [place] W, NP   7 months ago Type 2 diabetes mellitus with other specified complication, without long-term current use of insulin  [facility])   [place] [facility] [place], Marsa PARAS, DO       Future Appointments             In 4 months Edman, Marsa PARAS, DO [place], [facility]

## 2023-03-12 ENCOUNTER — Encounter: Payer: Self-pay | Admitting: Podiatry

## 2023-03-13 ENCOUNTER — Emergency Department: Payer: PPO

## 2023-03-13 ENCOUNTER — Ambulatory Visit (INDEPENDENT_AMBULATORY_CARE_PROVIDER_SITE_OTHER): Payer: Self-pay | Admitting: Podiatry

## 2023-03-13 ENCOUNTER — Observation Stay
Admission: EM | Admit: 2023-03-13 | Discharge: 2023-03-14 | Disposition: A | Discharge status: Home / Self Care | Payer: PPO | Attending: Internal Medicine | Admitting: Internal Medicine

## 2023-03-13 ENCOUNTER — Other Ambulatory Visit: Payer: Self-pay

## 2023-03-13 ENCOUNTER — Encounter: Payer: Self-pay | Admitting: Podiatry

## 2023-03-13 ENCOUNTER — Encounter: Payer: Self-pay | Admitting: Emergency Medicine

## 2023-03-13 DIAGNOSIS — E1169 Type 2 diabetes mellitus with other specified complication: Secondary | ICD-10-CM | POA: Diagnosis present

## 2023-03-13 DIAGNOSIS — E785 Hyperlipidemia, unspecified: Secondary | ICD-10-CM | POA: Diagnosis not present

## 2023-03-13 DIAGNOSIS — Z8659 Personal history of other mental and behavioral disorders: Secondary | ICD-10-CM | POA: Diagnosis not present

## 2023-03-13 DIAGNOSIS — L97526 Non-pressure chronic ulcer of other part of left foot with bone involvement without evidence of necrosis: Principal | ICD-10-CM | POA: Insufficient documentation

## 2023-03-13 DIAGNOSIS — E11621 Type 2 diabetes mellitus with foot ulcer: Secondary | ICD-10-CM | POA: Diagnosis not present

## 2023-03-13 DIAGNOSIS — R234 Changes in skin texture: Secondary | ICD-10-CM | POA: Diagnosis not present

## 2023-03-13 DIAGNOSIS — E0843 Diabetes mellitus due to underlying condition with diabetic autonomic (poly)neuropathy: Secondary | ICD-10-CM | POA: Diagnosis not present

## 2023-03-13 DIAGNOSIS — I2510 Atherosclerotic heart disease of native coronary artery without angina pectoris: Secondary | ICD-10-CM | POA: Diagnosis present

## 2023-03-13 DIAGNOSIS — I10 Essential (primary) hypertension: Secondary | ICD-10-CM | POA: Diagnosis present

## 2023-03-13 DIAGNOSIS — Z7984 Long term (current) use of oral hypoglycemic drugs: Secondary | ICD-10-CM | POA: Diagnosis not present

## 2023-03-13 DIAGNOSIS — M19041 Primary osteoarthritis, right hand: Secondary | ICD-10-CM | POA: Diagnosis present

## 2023-03-13 DIAGNOSIS — Z951 Presence of aortocoronary bypass graft: Secondary | ICD-10-CM

## 2023-03-13 DIAGNOSIS — M19042 Primary osteoarthritis, left hand: Secondary | ICD-10-CM | POA: Diagnosis present

## 2023-03-13 DIAGNOSIS — Z7901 Long term (current) use of anticoagulants: Secondary | ICD-10-CM | POA: Diagnosis not present

## 2023-03-13 DIAGNOSIS — Z8679 Personal history of other diseases of the circulatory system: Secondary | ICD-10-CM | POA: Diagnosis not present

## 2023-03-13 DIAGNOSIS — I480 Paroxysmal atrial fibrillation: Secondary | ICD-10-CM | POA: Diagnosis not present

## 2023-03-13 DIAGNOSIS — L03116 Cellulitis of left lower limb: Secondary | ICD-10-CM | POA: Diagnosis not present

## 2023-03-13 DIAGNOSIS — M7989 Other specified soft tissue disorders: Secondary | ICD-10-CM | POA: Diagnosis not present

## 2023-03-13 DIAGNOSIS — M609 Myositis, unspecified: Secondary | ICD-10-CM | POA: Diagnosis not present

## 2023-03-13 DIAGNOSIS — L853 Xerosis cutis: Secondary | ICD-10-CM | POA: Insufficient documentation

## 2023-03-13 DIAGNOSIS — E669 Obesity, unspecified: Secondary | ICD-10-CM | POA: Diagnosis not present

## 2023-03-13 DIAGNOSIS — F419 Anxiety disorder, unspecified: Secondary | ICD-10-CM | POA: Diagnosis not present

## 2023-03-13 DIAGNOSIS — M180 Bilateral primary osteoarthritis of first carpometacarpal joints: Secondary | ICD-10-CM | POA: Insufficient documentation

## 2023-03-13 DIAGNOSIS — Z79899 Other long term (current) drug therapy: Secondary | ICD-10-CM | POA: Diagnosis not present

## 2023-03-13 DIAGNOSIS — Z87891 Personal history of nicotine dependence: Secondary | ICD-10-CM | POA: Insufficient documentation

## 2023-03-13 DIAGNOSIS — L97522 Non-pressure chronic ulcer of other part of left foot with fat layer exposed: Secondary | ICD-10-CM | POA: Diagnosis not present

## 2023-03-13 DIAGNOSIS — M1710 Unilateral primary osteoarthritis, unspecified knee: Secondary | ICD-10-CM | POA: Diagnosis present

## 2023-03-13 DIAGNOSIS — Z6830 Body mass index (BMI) 30.0-30.9, adult: Secondary | ICD-10-CM | POA: Diagnosis not present

## 2023-03-13 DIAGNOSIS — L97529 Non-pressure chronic ulcer of other part of left foot with unspecified severity: Secondary | ICD-10-CM | POA: Diagnosis not present

## 2023-03-13 DIAGNOSIS — E66811 Obesity, class 1: Secondary | ICD-10-CM | POA: Diagnosis present

## 2023-03-13 DIAGNOSIS — I252 Old myocardial infarction: Secondary | ICD-10-CM

## 2023-03-13 LAB — BASIC METABOLIC PANEL
Anion gap: 12 (ref 5–15)
BUN: 15 mg/dL (ref 8–23)
CO2: 27 mmol/L (ref 22–32)
Calcium: 9.3 mg/dL (ref 8.9–10.3)
Chloride: 100 mmol/L (ref 98–111)
Creatinine, Ser: 0.95 mg/dL (ref 0.61–1.24)
GFR, Estimated: >60 mL/min (ref >60)
Glucose, Bld: 203 mg/dL — ABNORMAL HIGH (ref 70–99)
Potassium: 4.1 mmol/L (ref 3.5–5.1)
Sodium: 139 mmol/L (ref 135–145)

## 2023-03-13 LAB — CBC WITH DIFFERENTIAL/PLATELET
Abs Immature Granulocytes: 0.01 10*3/uL (ref 0.00–0.07)
Basophils Absolute: 0.1 10*3/uL (ref 0.0–0.1)
Basophils Relative: 1 %
Eosinophils Absolute: 0.3 10*3/uL (ref 0.0–0.5)
Eosinophils Relative: 4 %
HCT: 45 % (ref 39.0–52.0)
Hemoglobin: 15.7 g/dL (ref 13.0–17.0)
Immature Granulocytes: 0 %
Lymphocytes Relative: 24 %
Lymphs Abs: 1.6 10*3/uL (ref 0.7–4.0)
MCH: 33.2 pg (ref 26.0–34.0)
MCHC: 34.9 g/dL (ref 30.0–36.0)
MCV: 95.1 fL (ref 80.0–100.0)
Monocytes Absolute: 0.6 10*3/uL (ref 0.1–1.0)
Monocytes Relative: 9 %
Neutro Abs: 4.2 10*3/uL (ref 1.7–7.7)
Neutrophils Relative %: 62 %
Platelets: 199 10*3/uL (ref 150–400)
RBC: 4.73 MIL/uL (ref 4.22–5.81)
RDW: 13.9 % (ref 11.5–15.5)
WBC: 6.8 10*3/uL (ref 4.0–10.5)
nRBC: 0 % (ref 0.0–0.2)

## 2023-03-13 LAB — CBG MONITORING, ED: Glucose-Capillary: 137 mg/dL — ABNORMAL HIGH (ref 70–99)

## 2023-03-13 MED ORDER — HEPARIN SODIUM (PORCINE) 5000 UNIT/ML IJ SOLN: 5000.0000 [IU] | Freq: Three times a day (TID) | INTRAMUSCULAR | Status: DC

## 2023-03-13 MED ORDER — LISINOPRIL 10 MG PO TABS
20.0000 mg | ORAL_TABLET | Freq: Two times a day (BID) | ORAL | Status: DC
Start: 2023-03-13 — End: 2023-03-14
  Administered 2023-03-13: 20 mg via ORAL
  Filled 2023-03-13: qty 2

## 2023-03-13 MED ORDER — GABAPENTIN 100 MG PO CAPS
100.0000 mg | ORAL_CAPSULE | Freq: Two times a day (BID) | ORAL | Status: DC
  Administered 2023-03-13: 100 mg via ORAL
  Filled 2023-03-13: qty 1

## 2023-03-13 MED ORDER — VANCOMYCIN HCL 500 MG/100ML IV SOLN
500.0000 mg | Freq: Once | INTRAVENOUS | Status: AC
  Administered 2023-03-13: 500 mg via INTRAVENOUS
  Filled 2023-03-13: qty 100

## 2023-03-13 MED ORDER — ONDANSETRON HCL 4 MG PO TABS: 4.0000 mg | ORAL_TABLET | Freq: Four times a day (QID) | ORAL | Status: DC | PRN

## 2023-03-13 MED ORDER — ROSUVASTATIN CALCIUM 20 MG PO TABS
20.0000 mg | ORAL_TABLET | Freq: Every day | ORAL | Status: DC
  Administered 2023-03-13: 20 mg via ORAL
  Filled 2023-03-13: qty 1

## 2023-03-13 MED ORDER — DIPHENHYDRAMINE-ZINC ACETATE 2-0.1 % EX CREA: TOPICAL_CREAM | Freq: Two times a day (BID) | CUTANEOUS | Status: DC | PRN

## 2023-03-13 MED ORDER — MORPHINE SULFATE (PF) 4 MG/ML IV SOLN: 4.0000 mg | INTRAVENOUS | Status: DC | PRN

## 2023-03-13 MED ORDER — NITROGLYCERIN 0.4 MG SL SUBL: 0.4000 mg | SUBLINGUAL_TABLET | SUBLINGUAL | Status: DC | PRN

## 2023-03-13 MED ORDER — HYDROCERIN EX CREA: TOPICAL_CREAM | Freq: Two times a day (BID) | CUTANEOUS | Status: DC | PRN

## 2023-03-13 MED ORDER — INSULIN ASPART 100 UNIT/ML IJ SOLN: 0.0000 [IU] | Freq: Every day | INTRAMUSCULAR | Status: DC

## 2023-03-13 MED ORDER — ONDANSETRON HCL 4 MG/2ML IJ SOLN: 4.0000 mg | Freq: Four times a day (QID) | INTRAMUSCULAR | Status: DC | PRN

## 2023-03-13 MED ORDER — MORPHINE SULFATE (PF) 2 MG/ML IV SOLN: 2.0000 mg | INTRAVENOUS | Status: DC | PRN

## 2023-03-13 MED ORDER — SODIUM CHLORIDE 0.9 % IV SOLN
2.0000 g | Freq: Three times a day (TID) | INTRAVENOUS | Status: DC
  Administered 2023-03-14: 2 g via INTRAVENOUS
  Filled 2023-03-13: qty 12.5

## 2023-03-13 MED ORDER — INSULIN ASPART 100 UNIT/ML IJ SOLN
0.0000 [IU] | Freq: Three times a day (TID) | INTRAMUSCULAR | Status: DC
  Filled 2023-03-13: qty 1

## 2023-03-13 MED ORDER — ESCITALOPRAM OXALATE 10 MG PO TABS: 20.0000 mg | ORAL_TABLET | Freq: Every day | ORAL | Status: DC

## 2023-03-13 MED ORDER — AMLODIPINE BESYLATE 5 MG PO TABS: 10.0000 mg | ORAL_TABLET | Freq: Every day | ORAL | Status: DC

## 2023-03-13 MED ORDER — METOPROLOL TARTRATE 25 MG PO TABS
25.0000 mg | ORAL_TABLET | Freq: Two times a day (BID) | ORAL | Status: DC
Start: 2023-03-13 — End: 2023-03-14
  Administered 2023-03-13: 25 mg via ORAL
  Filled 2023-03-13: qty 1

## 2023-03-13 MED ORDER — OXYCODONE-ACETAMINOPHEN 5-325 MG PO TABS
1.0000 | ORAL_TABLET | Freq: Four times a day (QID) | ORAL | Status: DC | PRN
  Administered 2023-03-13: 1 via ORAL
  Filled 2023-03-13: qty 1

## 2023-03-13 MED ORDER — HYDRALAZINE HCL 20 MG/ML IJ SOLN: 5.0000 mg | Freq: Four times a day (QID) | INTRAMUSCULAR | Status: DC | PRN

## 2023-03-13 MED ORDER — ACETAMINOPHEN 325 MG PO TABS: 650.0000 mg | ORAL_TABLET | Freq: Four times a day (QID) | ORAL | Status: DC | PRN

## 2023-03-13 MED ORDER — VANCOMYCIN HCL 2000 MG/400ML IV SOLN
2000.0000 mg | Freq: Once | INTRAVENOUS | Status: AC
  Administered 2023-03-13: 2000 mg via INTRAVENOUS
  Filled 2023-03-13: qty 400

## 2023-03-13 MED ORDER — FOLIC ACID 1 MG PO TABS: 1.0000 mg | ORAL_TABLET | ORAL | Status: DC

## 2023-03-13 MED ORDER — LORAZEPAM 2 MG/ML IJ SOLN: 0.5000 mg | Freq: Four times a day (QID) | INTRAMUSCULAR | Status: DC | PRN

## 2023-03-13 MED ORDER — VANCOMYCIN HCL 1500 MG/300ML IV SOLN
1500.0000 mg | Freq: Two times a day (BID) | INTRAVENOUS | Status: DC
  Administered 2023-03-14: 1500 mg via INTRAVENOUS
  Filled 2023-03-13 (×2): qty 300

## 2023-03-13 MED ORDER — SODIUM CHLORIDE 0.9 % IV SOLN
2.0000 g | Freq: Once | INTRAVENOUS | Status: AC
  Administered 2023-03-13: 2 g via INTRAVENOUS
  Filled 2023-03-13: qty 12.5

## 2023-03-13 MED ORDER — CYANOCOBALAMIN 500 MCG PO TABS
1000.0000 ug | ORAL_TABLET | Freq: Every day | ORAL | Status: DC
Start: 2023-03-14 — End: 2023-03-14

## 2023-03-13 MED ORDER — ACETAMINOPHEN 650 MG RE SUPP: 650.0000 mg | Freq: Four times a day (QID) | RECTAL | Status: DC | PRN

## 2023-03-13 NOTE — Assessment & Plan Note (Signed)
In 2021

## 2023-03-13 NOTE — Assessment & Plan Note (Signed)
 Metoprolol  25 mg p.o. twice daily resumed

## 2023-03-13 NOTE — ED Provider Notes (Signed)
 [facility] Provider Note    Event Date/Time   First MD Initiated Contact with Patient 03/13/23 1653     (approximate)   History   Foot Pain   HPI JOSON SAPP is a 73 y.o. male with history of DM2, HTN, CAD presenting today for toe pain.  Patient was seen as a podiatry office for ongoing evaluation of his left second digit toe infection.  Has noted draining and worsening swelling along with redness.  Does have prior history of amputations of toes on that foot.  At the office, they noticed diabetic foot ulcer with underlying bone infection.  They sent patient to the ED for admission to the hospital for IV antibiotics and left second digit amputation tomorrow.  Also recommended getting MRI.  Patient states pain in that foot but otherwise denies fever or chills.     Physical Exam   Triage Vital Signs: ED Triage Vitals  Encounter Vitals Group     BP 03/13/23 1432 134/78     Systolic BP Percentile --      Diastolic BP Percentile --      Pulse Rate 03/13/23 1432 68     Resp 03/13/23 1432 16     Temp 03/13/23 1432 98.7 F (37.1 C)     Temp Source 03/13/23 1432 Oral     SpO2 03/13/23 1432 95 %     Weight 03/13/23 1433 262 lb 5.6 oz (119 kg)     Height 03/13/23 1433 6' 2 (1.88 m)     Head Circumference --      Peak Flow --      Pain Score 03/13/23 1433 6     Pain Loc --      Pain Education --      Exclude from Growth Chart --     Most recent vital signs: Vitals:   03/13/23 1432  BP: 134/78  Pulse: 68  Resp: 16  Temp: 98.7 F (37.1 C)  SpO2: 95%   I have reviewed the vital signs. General:  Awake, alert, no acute distress. Head:  Normocephalic, Atraumatic. EENT:  PERRL, EOMI, Oral mucosa pink and moist, Neck is supple. Cardiovascular: Regular rate, 2+ distal pulses. Respiratory:  Normal respiratory effort, symmetrical expansion, no distress.   Extremities:  Moving all four extremities through full ROM without pain.   Neuro:  Alert and  oriented.  Interacting appropriately.   Skin: Diabetic ulcer to left foot second digit.  Surrounding erythema extending into the foot with swelling present there. Psych: Appropriate affect.    ED Results / Procedures / Treatments   Labs (all labs ordered are listed, but only abnormal results are displayed) Labs Reviewed  BASIC METABOLIC PANEL - Abnormal; Notable for the following components:      Result Value   Glucose, Bld 203 (*)    All other components within normal limits  CBC WITH DIFFERENTIAL/PLATELET     EKG    RADIOLOGY    PROCEDURES:  Critical Care performed: No  Procedures   MEDICATIONS ORDERED IN ED: Medications - No data to display   IMPRESSION / MDM / ASSESSMENT AND PLAN / ED COURSE  I reviewed the triage vital signs and the nursing notes.                              Differential diagnosis includes, but is not limited to, diabetic toe infection, cellulitis  Patient's presentation is most  consistent with acute presentation with potential threat to life or bodily function.  Patient is a 73 year old male presenting today for diabetic ulcer with infection to the bone of his left second toe.  Prior history of amputations on that side.  Sent in by podiatry for IV antibiotics and admission for surgery tomorrow.  Patient otherwise stable with no other acute complaints.  Slight surrounding cellulitis present.  Started on vancomycin  and cefepime  and admitted to hospitalist for further care.     FINAL CLINICAL IMPRESSION(S) / ED DIAGNOSES   Final diagnoses:  Diabetic ulcer of toe of left foot associated with type 2 diabetes mellitus, with bone involvement without evidence of necrosis (HCC)     Rx / DC Orders   ED Discharge Orders     None        Note:  This document was prepared using Dragon voice recognition software and may include unintentional dictation errors.   Malvina Alm DASEN, MD 03/13/23 [phone]

## 2023-03-13 NOTE — Hospital Course (Addendum)
 [name] is a 73 year old male with history of depression, hypertension, non-insulin -dependent diabetes mellitus, neuropathy, CAD status post CABG in 2021, history of central retinal vein occlusion with macular edema of the left eye, pseudophakia of both eyes, previously on bevacizumab  injections, who presents to the emergency department for chief concerns of left second toe infection/ulceration requiring amputation.  Vitals in the ED showed temperature of 98.7, respiration rate of 16, heart rate of 68, blood pressure 134/78, SpO2 of 95% on room air.  Serum sodium is 139, potassium 4.1, chloride 100, bicarb 27, BUN of 15, serum creatinine of 0.95, EGFR greater than 60, nonfasting blood glucose 203, WBC 6.8, hemoglobin 15.7, platelets of 199.  ED treatment: Cefepime  per pharmacy, vancomycin  per pharmacy.  2/7: Patient is with left second toe osteomyelitis, podiatry is planning TMA, ABI with normal resting ankle-brachial indexes bilaterally, there were some distal waveform abnormalities involving bilateral posterior tibial arteries and left dorsalis pedis artery.  2+ pedal pulses bilaterally. Mild hypokalemia which was repleted.  Patient was very upset that surgery is not until Sunday, apparently he was told to come to ED for immediate surgery??  Tried explaining the rational behind it multiple times but he insisted to leave AMA, also discussed with podiatry. Patient was given p.o. antibiotics as courtesy as he just does not want to stay. Patient was advised to follow-up with his podiatrist for further recommendations.

## 2023-03-13 NOTE — H&P (Addendum)
 History and Physical   Kevin Perry FMW:[id number] DOB: 03-18-1950 DOA: 03/13/2023  PCP: [name], DO  Outpatient Specialists: Dr. Franky Blanch, podiatry; Dr. Dewane Glenn clinic cardiology Patient coming from: [place] [facility]  I have personally briefly reviewed patient's old medical records in [facility] EMR.  Chief Concern: Left toe wound  HPI: [name] General is a 73 year old male with history of depression, hypertension, non-insulin -dependent diabetes mellitus, neuropathy, CAD status post CABG in 2021, history of central retinal vein occlusion with macular edema of the left eye, pseudophakia of both eyes, previously on bevacizumab  injections, who presents to the emergency department for chief concerns of left second toe infection/ulceration requiring amputation.  Vitals in the ED showed temperature of 98.7, respiration rate of 16, heart rate of 68, blood pressure 134/78, SpO2 of 95% on room air.  Serum sodium is 139, potassium 4.1, chloride 100, bicarb 27, BUN of 15, serum creatinine of 0.95, EGFR greater than 60, nonfasting blood glucose 203, WBC 6.8, hemoglobin 15.7, platelets of 199.  ED treatment: Cefepime  per pharmacy, vancomycin  per pharmacy. ------------------------------ At bedside, patient is able to tell me his name, age, location, current calendar year.  He reports that his second left toe has been bothering him for approximately 3 to 4 weeks.  He does not know how it happened, he just noticed 1 day that there was a hole developing under the toe.  He denies any known blisters, bug bites, trauma to the toe.  He reports that he has been having difficulty with many of his other toes and there has been several that have been amputated in the past.  He reports no changes to his diet however states that at 72 he would like to eat whenever he likes.  He denies fever, chills, chest pain, shortness of breath, dysuria, hematuria, diarrhea, nausea,  vomiting.  Social history: He is at home with his wife.  He denies tobacco, EtOH, recreational drug use.  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  ROS: Constitutional: no weight change, no fever ENT/Mouth: no sore throat, no rhinorrhea Eyes: no eye pain, no vision changes Cardiovascular: no chest pain, no dyspnea,  no edema, no palpitations Respiratory: no cough, no sputum, no wheezing Gastrointestinal: no nausea, no vomiting, no diarrhea, no constipation Genitourinary: no urinary incontinence, no dysuria, no hematuria Musculoskeletal: no arthralgias, no myalgias Skin: + Left second toe lesion Neuro: no weakness, no loss of consciousness, no syncope Psych: no anxiety, no depression, no decrease appetite Heme/Lymph: no bruising, no bleeding  ED Course: Discussed with EDP, patient requiring hospitalization for chief concerns of left toe amputation in setting of suspected osteomyelitis.  Assessment/Plan  Principal Problem:   Foot ulceration, left, with fat layer exposed (HCC) Active Problems:   Hyperlipidemia associated with type 2 diabetes mellitus (HCC)   Coronary artery disease involving native coronary artery of native heart without angina pectoris   Primary osteoarthritis of knee   Anxiety   Essential hypertension   Type 2 diabetes mellitus with other specified complication (HCC)   Obesity (BMI 30.0-34.9)   Bilateral osteoarthritis of finger   History of MI (myocardial infarction)   S/P CABG x 4   Paroxysmal atrial fibrillation (HCC)   Dry skin   Assessment and Plan:  * Foot ulceration, left, with fat layer exposed (HCC) EDP has ordered MRI Check ABI on admission Continue vancomycin  and cefepime  per pharmacy Symptomatic support: Oxycodone -acetaminophen  5-325 mg tablet, 1 tablet every  6 hours as needed for moderate pain, 2 days ordered; morphine  2 mg IV every 4 hours as needed for severe pain, 20 hours  ordered; Morphine  4 mg q4h prn for severe pain not responsive to morphine  2 mg IV.  Coronary artery disease involving native coronary artery of native heart without angina pectoris Rosuvastatin  20 mg nightly, metoprolol  25 mg p.o. twice daily, lisinopril  20 mg p.o. twice daily were resumed on admission Home aspirin  81 mg daily not resumed on admission in anticipation of possible procedure tomorrow AM team to resume home aspirin  when the benefits outweigh the risk  Hyperlipidemia associated with type 2 diabetes mellitus (HCC) Non-insulin -dependent diabetes mellitus Home metformin  and Jardiance will not be resumed on admission Insulin  SSI with at bedtime coverage ordered on admission  Dry skin With occasional itchy skin with excoriation  Eucerin cream twice daily Diphenhydramine -zinc  cream twice daily as needed for itching skin, 7 days ordered  Paroxysmal atrial fibrillation (HCC) Metoprolol  25 mg p.o. twice daily resumed  S/P CABG x 4 In 2021  Obesity (BMI 30.0-34.9) This complicates overall care and prognosis.   Type 2 diabetes mellitus with other specified complication [facility]) Patient is n.p.o. after midnight Insulin  SSI with at bedtime coverage ordered  Essential hypertension Home amlodipine  10 mg daily, lisinopril  20 mg p.o. twice daily, metoprolol  tartrate 25 mg p.o. twice daily were resumed on admission Hydralazine  5 mg IV every 6 hours as needed for SBP greater 170, 5 days ordered  Anxiety Escitalopram  20 mg daily resumed, Ativan  0.5 mg IV every 6 hours as needed for anxiety, 18 hours of coverage ordered  Chart reviewed.   DVT prophylaxis: Heparin  5000 units subcutaneous every 8 hours starting on 03/14/2023 Code Status: Full code Diet: Heart healthy/carb modified; n.p.o. after midnight Family Communication: No Disposition Plan: Pending clinical course; ABI evaluation and possible vascular surgery Consults called: Podiatry Admission status: Telemetry medical,  observation  Past Medical History:  Diagnosis Date   Anxiety    Arthritis    Coronary artery disease    Diabetes mellitus without complication (HCC)    Hypertension    Hypertensive retinopathy    Myocardial infarction [facility])    Postoperative nausea 11/27/2019   Past Surgical History:  Procedure Laterality Date   BIOPSY  08/27/2022   Procedure: BIOPSY;  Surgeon: Maryruth Ole DASEN, MD;  Location: [facility] ENDOSCOPY;  Service: Endoscopy;;   CATARACT EXTRACTION     CHOLECYSTECTOMY     COLONOSCOPY WITH PROPOFOL  N/A 08/27/2022   Procedure: COLONOSCOPY WITH PROPOFOL ;  Surgeon: Maryruth Ole DASEN, MD;  Location: [facility] ENDOSCOPY;  Service: Endoscopy;  Laterality: N/A;   CORONARY ANGIOPLASTY WITH STENT PLACEMENT N/A 02/04/2006   ESOPHAGOGASTRODUODENOSCOPY (EGD) WITH PROPOFOL  N/A 08/27/2022   Procedure: ESOPHAGOGASTRODUODENOSCOPY (EGD) WITH PROPOFOL ;  Surgeon: Maryruth Ole DASEN, MD;  Location: [facility] ENDOSCOPY;  Service: Endoscopy;  Laterality: N/A;   EYE SURGERY     FOOT SURGERY Left    DOS 8.1.14 HALLIX IPJ FUSION, SECOND MET OSTEOTOMY W/SCREW, HAM TOE REPAIR 2,,4 , PARTIAL AMP 3RD DIGIT    GALLBLADDER SURGERY     HEMOSTASIS CLIP PLACEMENT  08/27/2022   Procedure: HEMOSTASIS CLIP PLACEMENT;  Surgeon: Maryruth Ole DASEN, MD;  Location: [facility] ENDOSCOPY;  Service: Endoscopy;;   KNEE SURERY Right    LEFT HEART CATH AND CORONARY ANGIOGRAPHY Left 11/17/2019   Procedure: LEFT HEART CATH AND CORONARY ANGIOGRAPHY;  Surgeon: Bosie Vinie LABOR, MD;  Location: [facility] INVASIVE CV LAB;  Service: Cardiovascular;  Laterality: Left;   MYRINGOTOMY WITH TUBE PLACEMENT  Right 10/25/2021   Procedure: MYRINGOTOMY WITH BUTTERFLY TUBE PLACEMENT;  Surgeon: Edda Mt, MD;  Location: [facility];  Service: ENT;  Laterality: Right;   POLYPECTOMY  08/27/2022   Procedure: POLYPECTOMY;  Surgeon: Maryruth Ole DASEN, MD;  Location: [facility] ENDOSCOPY;  Service: Endoscopy;;   SUBMUCOSAL LIFTING INJECTION  08/27/2022    Procedure: SUBMUCOSAL LIFTING INJECTION;  Surgeon: Maryruth Ole DASEN, MD;  Location: [facility] ENDOSCOPY;  Service: Endoscopy;;  ELEVIEW ASCENDING COLON POLYP   TOTAL KNEE ARTHROPLASTY Right 08/30/2014   Procedure: TOTAL KNEE ARTHROPLASTY;  Surgeon: Ozell Flake, MD;  Location: [facility] ORS;  Service: Orthopedics;  Laterality: Right;   Social History:  reports that he quit smoking about 18 years ago. His smoking use included cigarettes. He quit smokeless tobacco use about 19 years ago. He reports that he does not currently use alcohol after a past usage of about 2.0 standard drinks of alcohol per week. He reports that he does not use drugs.  Allergies  Allergen Reactions   Naproxen Swelling and Anaphylaxis   Family History  Problem Relation Age of Onset   Heart attack Mother    Diabetes Father    Hypertension Father    Heart attack Sister    Family history: Family history reviewed and not pertinent.  Prior to Admission medications   Medication Sig Start Date End Date Taking? Authorizing Provider  amLODipine  (NORVASC ) 10 MG tablet TAKE 1 TABLET BY MOUTH ONCE DAILY 12/10/22   Edman, Marsa PARAS, DO  aspirin  EC 81 MG tablet Take 81 mg by mouth daily. Swallow whole.    [provider]  baclofen  (LIORESAL ) 10 MG tablet Take 0.5-1 tablets (5-10 mg total) by mouth 3 (three) times daily as needed for muscle spasms. 12/24/22   Karamalegos, Marsa PARAS, DO  buPROPion  (WELLBUTRIN  XL) 300 MG 24 hr tablet TAKE 1 TABLET BY MOUTH ONCE DAILY ** DOSE INCREASE 06/27/22   Antonette Angeline ORN, NP  Cholecalciferol (VITAMIN D3) 125 MCG (5000 UT) CAPS Take 1 capsule (5,000 Units total) by mouth daily. For 8 weeks, then start Vitamin D3 2,000 units daily (OTC) 01/20/23   Karamalegos, Marsa PARAS, DO  cyanocobalamin  (VITAMIN B12) 1000 MCG tablet Take 1 tablet (1,000 mcg total) by mouth daily. 01/20/23   Karamalegos, Marsa PARAS, DO  empagliflozin (JARDIANCE) 25 MG TABS tablet Take 1 tablet by mouth daily.  02/03/23 02/03/24  [provider]  escitalopram  (LEXAPRO ) 20 MG tablet TAKE 1 TABLET BY MOUTH ONCE DAILY AS DIRECTED TAPER AS DIRECTED. OFF ZOLOFT  AND ON TO ESCITALOPRAM  02/06/23   Karamalegos, Marsa PARAS, DO  folic acid  (FOLVITE ) 1 MG tablet Take 1 mg by mouth See admin instructions. Take 1 mg daily except skip dose on Wednesdays (methotrexate day) 08/27/16   [provider]  furosemide  (LASIX ) 40 MG tablet TAKE 1 TABLET BY MOUTH ONCE DAILY Patient taking differently: Take 40 mg by mouth 2 (two) times daily. 08/01/22   Karamalegos, Marsa PARAS, DO  gabapentin  (NEURONTIN ) 100 MG capsule TAKE 1 CAPSULE BY MOUTH TWICE DAILY 03/11/23   Antonette Angeline ORN, NP  lisinopril  (ZESTRIL ) 20 MG tablet Take 1 tablet by mouth 2 (two) times daily. 10/29/21   [provider]  loratadine  (CLARITIN ) 10 MG tablet Take 10 mg by mouth every morning.    [provider]  metFORMIN  (GLUCOPHAGE ) 500 MG tablet TAKE 2 TABLETS BY MOUTH ONCE EVERY MORNING WITH BREAKFAST AND 1 TABLET ONCEEVERY EVENING WITH DINNER 12/03/22   Karamalegos, Marsa PARAS, DO  methotrexate (RHEUMATREX) 2.5 MG  tablet Take 10 mg by mouth once a week. On Wednesdays 06/05/22   [provider]  metoprolol  tartrate (LOPRESSOR ) 25 MG tablet TAKE 1 TABLET BY MOUTH TWICE DAILY 12/03/22   Edman, Marsa PARAS, DO  nitroGLYCERIN  (NITROSTAT ) 0.4 MG SL tablet Place under the tongue. 11/18/19   [provider]  REPATHA  SURECLICK 140 MG/ML SOAJ Inject 140 mg into the skin every 14 (fourteen) days. 01/20/23   Karamalegos, Marsa PARAS, DO  TRULICITY  3 MG/0.5ML SOPN INJECT 3 MG (0.5 ML) UNDER THE SKIN ONCE A WEEK 12/12/21   [name], DO   Physical Exam: Vitals:   03/13/23 1432 03/13/23 1433 03/13/23 1923  BP: 134/78  (!) 149/84  Pulse: 68  64  Resp: 16  18  Temp: 98.7 F (37.1 C)  99 F (37.2 C)  TempSrc: Oral  Oral  SpO2: 95%  96%  Weight:  119 kg   Height:  6' 2 (1.88 m)    Constitutional:  appears age appropriate, NAD, calm Eyes: PERRL, lids and conjunctivae normal ENMT: Mucous membranes are moist. Posterior pharynx clear of any exudate or lesions. Age-appropriate dentition. Hearing appropriate Neck: normal, supple, no masses, no thyromegaly Respiratory: clear to auscultation bilaterally, no wheezing, no crackles. Normal respiratory effort. No accessory muscle use.  Cardiovascular: Regular rate and rhythm, no murmurs / rubs / gallops. No extremity edema. 2+ pedal pulses. No carotid bruits.  Abdomen: obese abdomen, no tenderness, no masses palpated, no hepatosplenomegaly. Bowel sounds positive.  Musculoskeletal: no clubbing / cyanosis. No joint deformity upper and lower extremities. Good ROM, no contractures, no atrophy. Normal muscle tone.  Skin: bilateral lower extremity dryness with areas of excoriation.  Neurologic: Sensation intact. Strength 5/5 in all 4.  Psychiatric: Normal judgment and insight. Alert and oriented x 3. Normal mood.   EKG: Not indicated at this time  Chest x-ray on Admission: Not indicated at this time  Labs on Admission: I have personally reviewed following labs  CBC: Recent Labs  Lab 03/13/23 1440  WBC 6.8  NEUTROABS 4.2  HGB 15.7  HCT 45.0  MCV 95.1  PLT 199   Basic Metabolic Panel: Recent Labs  Lab 03/13/23 1440  NA 139  K 4.1  CL 100  CO2 27  GLUCOSE 203*  BUN 15  CREATININE 0.95  CALCIUM  9.3   GFR: Estimated Creatinine Clearance: 96.3 mL/min (by C-G formula based on SCr of 0.95 mg/dL).  Urine analysis:    Component Value Date/Time   COLORURINE YELLOW (A) 08/17/2014 1505   APPEARANCEUR CLEAR (A) 08/17/2014 1505   LABSPEC 1.016 08/17/2014 1505   PHURINE 6.0 08/17/2014 1505   GLUCOSEU NEGATIVE 08/17/2014 1505   HGBUR NEGATIVE 08/17/2014 1505   BILIRUBINUR NEGATIVE 08/17/2014 1505   KETONESUR NEGATIVE 08/17/2014 1505   PROTEINUR NEGATIVE 08/17/2014 1505   NITRITE NEGATIVE 08/17/2014 1505   LEUKOCYTESUR NEGATIVE  08/17/2014 1505   This document was prepared using Dragon Voice Recognition software and may include unintentional dictation errors.  Dr. Sherre [facility]  If 7PM-7AM, please contact overnight-coverage provider If 7AM-7PM, please contact day attending provider [email or web address]  03/13/2023, 7:29 PM

## 2023-03-13 NOTE — ED Notes (Signed)
 Pt called out to use urinal, pt, also,  requesting something for foot pain. See MAR.

## 2023-03-13 NOTE — ED Notes (Signed)
 Pt found to be 88% on RA while resting, pt placed on 2L [place] at this time.

## 2023-03-13 NOTE — Assessment & Plan Note (Signed)
 Non-insulin -dependent diabetes mellitus Home metformin  and Jardiance will not be resumed on admission Insulin  SSI with at bedtime coverage ordered on admission

## 2023-03-13 NOTE — ED Notes (Signed)
 Wife called asking about pt's surgery, no set time identified. Will callback if more information is provided, ok per pt. Pt's wife's name is Angie and her contact number is [phone].

## 2023-03-13 NOTE — Assessment & Plan Note (Addendum)
 Patient is n.p.o. after midnight Insulin  SSI with at bedtime coverage ordered

## 2023-03-13 NOTE — Assessment & Plan Note (Addendum)
 With occasional itchy skin with excoriation  Eucerin cream twice daily Diphenhydramine -zinc  cream twice daily as needed for itching skin, 7 days ordered

## 2023-03-13 NOTE — Progress Notes (Signed)
  Subjective:  Patient ID: Kevin Perry, male    DOB: Dec 29, 1950,  MRN: [id number]  Chief Complaint  Patient presents with   Wound Check    My toe had a place on it but my wife said it closed up.  It's still red and it's swollen.    73 y.o. male presents with the above complaint.  Patient presents with complaint of left second digit infection.  He states is gotten worse he is a diabetic.  He states that is draining is swollen is red he wanted to get it evaluated.  He would like to close next treatment plan.  Is gotten worse over the last few days   Review of Systems: Negative except as noted in the HPI. Denies N/V/F/Ch.  Past Medical History:  Diagnosis Date   Anxiety    Arthritis    Coronary artery disease    Diabetes mellitus without complication (HCC)    Hypertension    Hypertensive retinopathy    Myocardial infarction [facility])    Postoperative nausea 11/27/2019     Social History   Tobacco Use  Smoking Status Former   Current packs/day: 0.00   Types: Cigarettes   Quit date: 08/16/2004   Years since quitting: 18.5  Smokeless Tobacco Former   Quit date: 2006  Tobacco Comments   quit 20 plus years    Allergies  Allergen Reactions   Naproxen Swelling and Anaphylaxis   Objective:  There were no vitals filed for this visit. There is no height or weight on file to calculate BMI. Constitutional Well developed. Well nourished.  Vascular Dorsalis pedis pulses faintly palpable bilaterally. Posterior tibial pulses faintly palpable bilaterally. Capillary refill normal to all digits.  No cyanosis or clubbing noted. Pedal hair growth normal.  Neurologic Normal speech. Oriented to person, place, and time. Epicritic sensation to light touch grossly present bilaterally.  Dermatologic Nails well groomed and normal in appearance. No open wounds. No skin lesions.  Orthopedic: Left second digit wound probing down to bone cellutisits and erythema malodor present.  No other  abnormalities noted.  History of prior amputation   Radiographs: None Assessment:   1. Toe ulcer, left, with fat layer exposed (HCC)   2. Diabetes mellitus due to underlying condition with diabetic autonomic neuropathy, unspecified whether long term insulin  use (HCC)    Plan:  Patient was evaluated and treated and all questions answered.  Left second digit diabetic foot ulcer with underlying bone infection -All questions and concerns were discussed with the patient extensively -I discussed with the patient extensive detail given the nature of the infection have asked that got involved patient will benefit from left second digit amputation and IV antibiotics he will be sent to [facility] hospital for IV antibiotics and admission into the hospital -Patient will benefit from an MRI -Will also benefit from ABIs PVRs to assess the flow to the left lower extremity -Weightbearing as tolerated in surgical shoe -Betadine wet-to-dry dressing  No follow-ups on file.

## 2023-03-13 NOTE — Assessment & Plan Note (Signed)
 Home amlodipine  10 mg daily, lisinopril  20 mg p.o. twice daily, metoprolol  tartrate 25 mg p.o. twice daily were resumed on admission Hydralazine  5 mg IV every 6 hours as needed for SBP greater 170, 5 days ordered

## 2023-03-13 NOTE — Assessment & Plan Note (Signed)
 Escitalopram  20 mg daily resumed, Ativan  0.5 mg IV every 6 hours as needed for anxiety, 18 hours of coverage ordered

## 2023-03-13 NOTE — Assessment & Plan Note (Signed)
 -  This complicates overall care and prognosis.

## 2023-03-13 NOTE — Consult Note (Signed)
 Pharmacy Antibiotic Note  Kevin Perry is a 73 y.o. male admitted on 03/13/2023 with  osteomyelitis . Patient was seen at [facility] this morning for worsening of the left second digit diabetic foot ulcer with underlying osteomyelitis. He noted the wound has been draining and swelling with associated redness. Plan is for surgery tomorrow. Pharmacy has been consulted for vancomycin  & cefepime  dosing.  Today, 03/13/2023 Day 1 of vancomycin /cefepime   WBC 6.8 Afebrile  Scr 0.95 today with estimated CrCl 96.3 mL/min  Patient received one time doses of vancomycin  2 gm and cefepime  2 gm   Plan: Will give an additional vancomycin  500 mg IV x 1 to complete a full loading dose of 2.5 gm, followed by vancomycin  1500 mg IV Q12H Goal AUC 450-550  Estimated AUC 484.8 Estimated Cmin 14.1 Scr used 0.95  IBW, Vd 0.72  Start cefepime  2 gm IV Q8H based on current renal function  Pharmacy will continue to monitor and dose adjust appropriately   Height: 6' 2 (188 cm) Weight: 119 kg (262 lb 5.6 oz) IBW/kg (Calculated) : 82.2  Temp (24hrs), Avg:98.7 F (37.1 C), Min:98.7 F (37.1 C), Max:98.7 F (37.1 C)  Recent Labs  Lab 03/13/23 1440  WBC 6.8  CREATININE 0.95    Estimated Creatinine Clearance: 96.3 mL/min (by C-G formula based on SCr of 0.95 mg/dL).    Allergies  Allergen Reactions   Naproxen Swelling and Anaphylaxis   Antimicrobials this admission: 02/06 Vancomycin  >> 02/06 Cefepime  >>   Dose adjustments this admission:  Microbiology results: None at this time  Thank you for allowing pharmacy to be a part of this patient's care.  Asaiah Hunnicutt, PharmD Pharmacy Resident  03/13/2023 5:56 PM

## 2023-03-13 NOTE — ED Triage Notes (Signed)
 Pt to ED via POV. Pt states that [facility] sent him to the ED to be admitted because he is having surgery on his foot tomorrow.

## 2023-03-13 NOTE — Assessment & Plan Note (Addendum)
 EDP has ordered MRI Check ABI on admission Continue vancomycin  and cefepime  per pharmacy Symptomatic support: Oxycodone -acetaminophen  5-325 mg tablet, 1 tablet every 6 hours as needed for moderate pain, 2 days ordered; morphine  2 mg IV every 4 hours as needed for severe pain, 20 hours ordered; Morphine  4 mg q4h prn for severe pain not responsive to morphine  2 mg IV.

## 2023-03-13 NOTE — Consult Note (Signed)
 ED Pharmacy Antibiotic Sign Off An antibiotic consult was received from an ED provider for Vancomycin  and cefepime  per pharmacy dosing for sepsis. A chart review was completed to assess appropriateness.   The following one time order(s) were placed:  Vancomycin  2000 mg IV x 1 dose Cefepime  2gm IV x 1 dose  Further antibiotic and/or antibiotic pharmacy consults should be ordered by the admitting provider if indicated.   Thank you for allowing pharmacy to be a part of this patient's care.   Summit Borchardt Rodriguez-Guzman PharmD, BCPS 03/13/2023 5:27 PM

## 2023-03-13 NOTE — Assessment & Plan Note (Addendum)
 Rosuvastatin  20 mg nightly, metoprolol  25 mg p.o. twice daily, lisinopril  20 mg p.o. twice daily were resumed on admission Home aspirin  81 mg daily not resumed on admission in anticipation of possible procedure tomorrow AM team to resume home aspirin  when the benefits outweigh the risk

## 2023-03-14 ENCOUNTER — Telehealth: Payer: Self-pay

## 2023-03-14 ENCOUNTER — Observation Stay: Payer: PPO

## 2023-03-14 ENCOUNTER — Telehealth: Payer: Self-pay | Admitting: Podiatry

## 2023-03-14 DIAGNOSIS — F419 Anxiety disorder, unspecified: Secondary | ICD-10-CM | POA: Diagnosis not present

## 2023-03-14 DIAGNOSIS — I10 Essential (primary) hypertension: Secondary | ICD-10-CM | POA: Diagnosis not present

## 2023-03-14 DIAGNOSIS — E1169 Type 2 diabetes mellitus with other specified complication: Secondary | ICD-10-CM | POA: Diagnosis not present

## 2023-03-14 DIAGNOSIS — E66811 Obesity, class 1: Secondary | ICD-10-CM | POA: Diagnosis not present

## 2023-03-14 DIAGNOSIS — E11621 Type 2 diabetes mellitus with foot ulcer: Principal | ICD-10-CM

## 2023-03-14 DIAGNOSIS — L97522 Non-pressure chronic ulcer of other part of left foot with fat layer exposed: Secondary | ICD-10-CM | POA: Diagnosis not present

## 2023-03-14 DIAGNOSIS — S91109A Unspecified open wound of unspecified toe(s) without damage to nail, initial encounter: Secondary | ICD-10-CM | POA: Diagnosis not present

## 2023-03-14 DIAGNOSIS — I2510 Atherosclerotic heart disease of native coronary artery without angina pectoris: Secondary | ICD-10-CM

## 2023-03-14 DIAGNOSIS — M19042 Primary osteoarthritis, left hand: Secondary | ICD-10-CM

## 2023-03-14 DIAGNOSIS — L97526 Non-pressure chronic ulcer of other part of left foot with bone involvement without evidence of necrosis: Secondary | ICD-10-CM | POA: Diagnosis not present

## 2023-03-14 DIAGNOSIS — M19041 Primary osteoarthritis, right hand: Secondary | ICD-10-CM | POA: Diagnosis not present

## 2023-03-14 DIAGNOSIS — E785 Hyperlipidemia, unspecified: Secondary | ICD-10-CM

## 2023-03-14 DIAGNOSIS — E119 Type 2 diabetes mellitus without complications: Secondary | ICD-10-CM | POA: Diagnosis not present

## 2023-03-14 LAB — BASIC METABOLIC PANEL
Anion gap: 9 (ref 5–15)
BUN: 13 mg/dL (ref 8–23)
CO2: 24 mmol/L (ref 22–32)
Calcium: 8.6 mg/dL — ABNORMAL LOW (ref 8.9–10.3)
Chloride: 106 mmol/L (ref 98–111)
Creatinine, Ser: 0.81 mg/dL (ref 0.61–1.24)
GFR, Estimated: >60 mL/min (ref >60)
Glucose, Bld: 130 mg/dL — ABNORMAL HIGH (ref 70–99)
Potassium: 3.4 mmol/L — ABNORMAL LOW (ref 3.5–5.1)
Sodium: 139 mmol/L (ref 135–145)

## 2023-03-14 LAB — CBC
HCT: 42.4 % (ref 39.0–52.0)
Hemoglobin: 14.6 g/dL (ref 13.0–17.0)
MCH: 33.6 pg (ref 26.0–34.0)
MCHC: 34.4 g/dL (ref 30.0–36.0)
MCV: 97.5 fL (ref 80.0–100.0)
Platelets: 171 10*3/uL (ref 150–400)
RBC: 4.35 MIL/uL (ref 4.22–5.81)
RDW: 14 % (ref 11.5–15.5)
WBC: 5.1 10*3/uL (ref 4.0–10.5)
nRBC: 0 % (ref 0.0–0.2)

## 2023-03-14 LAB — CBG MONITORING, ED: Glucose-Capillary: 138 mg/dL — ABNORMAL HIGH (ref 70–99)

## 2023-03-14 MED ORDER — POTASSIUM CHLORIDE CRYS ER 20 MEQ PO TBCR: 30.0000 meq | EXTENDED_RELEASE_TABLET | Freq: Once | ORAL | Status: DC

## 2023-03-14 MED ORDER — DOXYCYCLINE HYCLATE 100 MG PO TABS: 100.0000 mg | ORAL_TABLET | Freq: Two times a day (BID) | ORAL | 0 refills | Status: AC

## 2023-03-14 MED ORDER — AMOXICILLIN-POT CLAVULANATE 875-125 MG PO TABS: 1.0000 | ORAL_TABLET | Freq: Two times a day (BID) | ORAL | 0 refills | Status: AC

## 2023-03-14 NOTE — Telephone Encounter (Signed)
 Patients wife came in to BTG office stating her husband was in the car and he is VERY upset because he was suppose to have surgery today on his foot and they told him his surgery is on Sunday. Wife states pt left the hospital against medical will due to him being upset that he has to wait till Sunday. I called Dr Tobie to let him know what was going on. Dr Tobie states he told the pt he would be in the hospital during the weekend because he needs antibiotics. Patient states he was never told that and he isnt paying for 3 days of hospital stay. I explained to pt that per Dr Tobie he would have to go back to the ED and go through there to be admitted again. Patient states he is not, and he is going to find another podiatrist.

## 2023-03-14 NOTE — Consult Note (Signed)
 Subjective:  Patient ID: Kevin Perry, male    DOB: 06/15/1950,  MRN: [id number]  Patient with past medical history of HTN, DM2, neuropathy, CAD seen at beside today for concern of left second toe infection. He was seen in clinic yesterday with Dr. Tobie for concern of worsening second toe wound. He was advised to come to the ED for admission IV antibiotics and MRI and vascular check. Relates currently doing well and no pain. Wondering when he will have surgery. Denies n/v/f/c.   Past Medical History:  Diagnosis Date   Anxiety    Arthritis    Coronary artery disease    Diabetes mellitus without complication (HCC)    Hypertension    Hypertensive retinopathy    Myocardial infarction [facility])    Postoperative nausea 11/27/2019     Past Surgical History:  Procedure Laterality Date   BIOPSY  08/27/2022   Procedure: BIOPSY;  Surgeon: Maryruth Ole DASEN, MD;  Location: [facility] ENDOSCOPY;  Service: Endoscopy;;   CATARACT EXTRACTION     CHOLECYSTECTOMY     COLONOSCOPY WITH PROPOFOL  N/A 08/27/2022   Procedure: COLONOSCOPY WITH PROPOFOL ;  Surgeon: Maryruth Ole DASEN, MD;  Location: [facility] ENDOSCOPY;  Service: Endoscopy;  Laterality: N/A;   CORONARY ANGIOPLASTY WITH STENT PLACEMENT N/A 02/04/2006   ESOPHAGOGASTRODUODENOSCOPY (EGD) WITH PROPOFOL  N/A 08/27/2022   Procedure: ESOPHAGOGASTRODUODENOSCOPY (EGD) WITH PROPOFOL ;  Surgeon: Maryruth Ole DASEN, MD;  Location: [facility] ENDOSCOPY;  Service: Endoscopy;  Laterality: N/A;   EYE SURGERY     FOOT SURGERY Left    DOS 8.1.14 HALLIX IPJ FUSION, SECOND MET OSTEOTOMY W/SCREW, HAM TOE REPAIR 2,,4 , PARTIAL AMP 3RD DIGIT    GALLBLADDER SURGERY     HEMOSTASIS CLIP PLACEMENT  08/27/2022   Procedure: HEMOSTASIS CLIP PLACEMENT;  Surgeon: Maryruth Ole DASEN, MD;  Location: [facility] ENDOSCOPY;  Service: Endoscopy;;   KNEE SURERY Right    LEFT HEART CATH AND CORONARY ANGIOGRAPHY Left 11/17/2019   Procedure: LEFT HEART CATH AND CORONARY ANGIOGRAPHY;  Surgeon: Bosie Vinie LABOR, MD;  Location: [facility] INVASIVE CV LAB;  Service: Cardiovascular;  Laterality: Left;   MYRINGOTOMY WITH TUBE PLACEMENT Right 10/25/2021   Procedure: MYRINGOTOMY WITH BUTTERFLY TUBE PLACEMENT;  Surgeon: Edda Mt, MD;  Location: [facility];  Service: ENT;  Laterality: Right;   POLYPECTOMY  08/27/2022   Procedure: POLYPECTOMY;  Surgeon: Maryruth Ole DASEN, MD;  Location: [facility] ENDOSCOPY;  Service: Endoscopy;;   SUBMUCOSAL LIFTING INJECTION  08/27/2022   Procedure: SUBMUCOSAL LIFTING INJECTION;  Surgeon: Maryruth Ole DASEN, MD;  Location: [facility] ENDOSCOPY;  Service: Endoscopy;;  ELEVIEW ASCENDING COLON POLYP   TOTAL KNEE ARTHROPLASTY Right 08/30/2014   Procedure: TOTAL KNEE ARTHROPLASTY;  Surgeon: Ozell Flake, MD;  Location: [facility] ORS;  Service: Orthopedics;  Laterality: Right;       Latest Ref Rng & Units 03/14/2023    5:57 AM 03/13/2023    2:40 PM 01/17/2023    9:17 AM  CBC  WBC 4.0 - 10.5 K/uL 5.1  6.8  5.4   Hemoglobin 13.0 - 17.0 g/dL 85.3  84.2  84.2   Hematocrit 39.0 - 52.0 % 42.4  45.0  46.0   Platelets 150 - 400 K/uL 171  199  191        Latest Ref Rng & Units 03/14/2023    5:57 AM 03/13/2023    2:40 PM 01/17/2023    9:17 AM  BMP  Glucose 70 - 99 mg/dL 869  796  838   BUN 8 - 23 mg/dL 13  15  15   Creatinine 0.61 - 1.24 mg/dL 9.18  9.04  9.06   BUN/Creat Ratio 6 - 22 (calc)   SEE NOTE:   Sodium 135 - 145 mmol/L 139  139  136   Potassium 3.5 - 5.1 mmol/L 3.4  4.1  4.0   Chloride 98 - 111 mmol/L 106  100  98   CO2 22 - 32 mmol/L 24  27  30    Calcium  8.9 - 10.3 mg/dL 8.6  9.3  9.9      Objective:   Vitals:   03/14/23 0520 03/14/23 0639  BP: (!) 146/80 (!) 152/76  Pulse: 63 60  Resp: 18 16  Temp: 98 F (36.7 C) 98 F (36.7 C)  SpO2: 93% 95%    General:AA&O x 3. Normal mood and affect   Vascular: DP and PT pulses 1/4 bilateral. Brisk capillary refill to all digits. Pedal hair present   Neruological. Epicritic sensation grossly intact.   Derm:  Distal left second digit wound with necrotic base Erythema and edema distrally and probe to bone noted. . Interspaces clears of maceration. Nails well groomed and normal in appearance  MSK: MMT 5/5 in dorsiflexion, plantar flexion, inversion and eversion. Normal joint ROM without pain or crepitus.   MRI left foot  IMPRESSION: 1. Open wound along the dorsal aspect of the second toe with findings consistent with osteomyelitis and likely septic arthritis at the DIP joint of the second toe. No discrete drainable abscess is identified. 2. Diffuse cellulitis involving the entire forefoot. 3. Diffuse myositis without findings for pyomyositis. 4. Prior amputation of the third toe. 5. Fusion across the first interphalangeal joint with a metallic screw and associated artifact. No complicating features are identified. 6. Severe midfoot degenerative changes likely neuropathic disease.   Pending ABIS   Assessment & Plan:  Patient was evaluated and treated and all questions answered.  DX: Left second toe osteomyelitis  Wound care: betadine , DSD  Antibiotics: Per primary  DME: post-op shoe.   Discussed with patient diagnosis and treatment options.  Imaging reviewed. Osteomyelitsi noted in distal second digit  Discussed imaging and diangosis with patient and didsucssed need for amputation of the toe. Did discuss given previous amputations it is advisable to go ahead with transmetatarsal amputation to have a more definitive procedure. Patient would like to proceed with this option Did discuss if ABIs shoe any concern may need to be evaluated by vascular first and may have to adjust plan.  Pending ABIS.  Will plan for left transmetatarsal amputation on Sunday morning. NPO after midnight on Saturday   Patient in agreement with plan and all questions answered.   Asberry Failing, DPM  Accessible via secure chat for questions or concerns.

## 2023-03-14 NOTE — ED Notes (Signed)
 Ace wrap on left foot, reinforced.

## 2023-03-14 NOTE — Telephone Encounter (Signed)
 Patients wife called stating Lonald was sent to the ED yesterday by Dr. Tobie to get surgery. Dr. Sikora stopped by this morning and Lemoine was told his surgery wouldn't happen until Sunday. The wife stated Jakub will not sit in the hospital that long. She would like for someone to call them.

## 2023-03-14 NOTE — ED Notes (Signed)
 Podiatry at bedside

## 2023-03-14 NOTE — ED Notes (Signed)
 Pt found to be 89% on 2L while resting, oxygen titrated to 4L  at this time

## 2023-03-14 NOTE — Discharge Summary (Signed)
 Physician Discharge Summary   Patient: Kevin Perry MRN: [id number] DOB: August 31, 1950  Admit date:     03/13/2023  Discharge date: 03/14/23  Discharge Physician: Amaryllis Dare   PCP: [name], DO   Recommendations at discharge:  Patient left AMA  Discharge Diagnoses: Principal Problem:   Foot ulceration, left, with fat layer exposed (HCC) Active Problems:   Hyperlipidemia associated with type 2 diabetes mellitus (HCC)   Coronary artery disease involving native coronary artery of native heart without angina pectoris   Primary osteoarthritis of knee   Anxiety   Essential hypertension   Type 2 diabetes mellitus with other specified complication (HCC)   Obesity (BMI 30.0-34.9)   Bilateral osteoarthritis of finger   History of MI (myocardial infarction)   S/P CABG x 4   Paroxysmal atrial fibrillation (HCC)   Dry skin   Diabetic ulcer of toe of left foot associated with type 2 diabetes mellitus, with bone involvement without evidence of necrosis (HCC)  Resolved Problems:   * No resolved hospital problems. [facility] Course: [name] is a 73 year old male with history of depression, hypertension, non-insulin -dependent diabetes mellitus, neuropathy, CAD status post CABG in 2021, history of central retinal vein occlusion with macular edema of the left eye, pseudophakia of both eyes, previously on bevacizumab  injections, who presents to the emergency department for chief concerns of left second toe infection/ulceration requiring amputation.  Vitals in the ED showed temperature of 98.7, respiration rate of 16, heart rate of 68, blood pressure 134/78, SpO2 of 95% on room air.  Serum sodium is 139, potassium 4.1, chloride 100, bicarb 27, BUN of 15, serum creatinine of 0.95, EGFR greater than 60, nonfasting blood glucose 203, WBC 6.8, hemoglobin 15.7, platelets of 199.  ED treatment: Cefepime  per pharmacy, vancomycin  per pharmacy.  2/7: Patient is with left  second toe osteomyelitis, podiatry is planning TMA, ABI with normal resting ankle-brachial indexes bilaterally, there were some distal waveform abnormalities involving bilateral posterior tibial arteries and left dorsalis pedis artery.  2+ pedal pulses bilaterally. Mild hypokalemia which was repleted.  Patient was very upset that surgery is not until Sunday, apparently he was told to come to ED for immediate surgery??  Tried explaining the rational behind it multiple times but he insisted to leave AMA, also discussed with podiatry. Patient was given p.o. antibiotics as courtesy as he just does not want to stay. Patient was advised to follow-up with his podiatrist for further recommendations.  Assessment and Plan: * Foot ulceration, left, with fat layer exposed (HCC) EDP has ordered MRI Check ABI on admission Continue vancomycin  and cefepime  per pharmacy Symptomatic support: Oxycodone -acetaminophen  5-325 mg tablet, 1 tablet every 6 hours as needed for moderate pain, 2 days ordered; morphine  2 mg IV every 4 hours as needed for severe pain, 20 hours ordered; Morphine  4 mg q4h prn for severe pain not responsive to morphine  2 mg IV.  Coronary artery disease involving native coronary artery of native heart without angina pectoris Rosuvastatin  20 mg nightly, metoprolol  25 mg p.o. twice daily, lisinopril  20 mg p.o. twice daily were resumed on admission Home aspirin  81 mg daily not resumed on admission in anticipation of possible procedure tomorrow AM team to resume home aspirin  when the benefits outweigh the risk  Hyperlipidemia associated with type 2 diabetes mellitus (HCC) Non-insulin -dependent diabetes mellitus Home metformin  and Jardiance will not be resumed on admission Insulin  SSI with at bedtime coverage ordered on admission  Dry skin With occasional itchy skin with  excoriation  Eucerin cream twice daily Diphenhydramine -zinc  cream twice daily as needed for itching skin, 7 days  ordered  Paroxysmal atrial fibrillation (HCC) Metoprolol  25 mg p.o. twice daily resumed  S/P CABG x 4 In 2021  Obesity (BMI 30.0-34.9) This complicates overall care and prognosis.   Type 2 diabetes mellitus with other specified complication [facility]) Patient is n.p.o. after midnight Insulin  SSI with at bedtime coverage ordered  Essential hypertension Home amlodipine  10 mg daily, lisinopril  20 mg p.o. twice daily, metoprolol  tartrate 25 mg p.o. twice daily were resumed on admission Hydralazine  5 mg IV every 6 hours as needed for SBP greater 170, 5 days ordered  Anxiety Escitalopram  20 mg daily resumed, Ativan  0.5 mg IV every 6 hours as needed for anxiety, 18 hours of coverage ordered   Consultants: Podiatry Procedures performed: None Disposition: Home Diet recommendation:   DISCHARGE MEDICATION:   Discharge Exam: Filed Weights   03/13/23 1433  Weight: 119 kg   General.  Obese gentleman, in no acute distress. Pulmonary.  Lungs clear bilaterally, normal respiratory effort. CV.  Regular rate and rhythm, no JVD, rub or murmur. Abdomen.  Soft, nontender, nondistended, BS positive. CNS.  Alert and oriented .  No focal neurologic deficit. Extremities.  No edema, no cyanosis, pulses intact and symmetrical.  Left foot with Ace wrap Psychiatry.  Judgment and insight appears normal.   Condition at discharge: Patient left AMA  The results of significant diagnostics from this hospitalization (including imaging, microbiology, ancillary and laboratory) are listed below for reference.   Imaging Studies: US  ARTERIAL ABI (SCREENING LOWER EXTREMITY) Result Date: 03/14/2023 CLINICAL DATA:  Open wound left second toe with MRI evidence of osteomyelitis. Prior amputation left third toe. History of prior right toe amputations. History diabetes, hypertension and hyperlipidemia. EXAM: NONINVASIVE PHYSIOLOGIC VASCULAR STUDY OF BILATERAL LOWER EXTREMITIES TECHNIQUE: Evaluation of both lower  extremities were performed at rest, including calculation of ankle-brachial indices with single level Doppler, pressure and pulse volume recording. COMPARISON:  None Available. FINDINGS: Right ABI:  1.21 Left ABI:  1.16 Right Lower Extremity: Biphasic/monophasic posterior tibial and triphasic dorsalis pedis waveforms. Left Lower Extremity: Monophasic posterior tibial and monophasic/biphasic dorsalis pedis waveforms. 1.0-1.4 Normal IMPRESSION: Normal resting ankle-brachial indices bilaterally. There are some distal waveform abnormalities involving bilateral posterior tibial arteries and the left dorsalis pedis artery. Electronically Signed   By: Marcey Moan M.D.   On: 03/14/2023 11:06   MR FOOT LEFT WO CONTRAST Result Date: 03/13/2023 CLINICAL DATA:  Diabetic foot ulcer. EXAM: MRI OF THE LEFT FOOT WITHOUT CONTRAST TECHNIQUE: Multiplanar, multisequence MR imaging of the left foot was performed. No intravenous contrast was administered. COMPARISON:  Prior radiographs from 09/05/2022 FINDINGS: Prior amputation of the third toe is noted. Fusion across the first interphalangeal joint with a metallic screw and associated artifact. No complicating features are identified. Open wound noted along the dorsal aspect of the second toe. There is diffuse abnormal subcutaneous soft tissue swelling/edema suggesting cellulitis. Moderate diffuse cellulitis involving the entire forefoot. There is also diffuse myositis without findings for pyomyositis. Suspect remote fusion at the proximal interphalangeal joint of the second toe. There is abnormal T1 and T2 signal intensity in the middle and distal phalanges consistent with osteomyelitis and likely septic arthritis at the DIP joint. No discrete drainable abscess is identified. No other sites of osteomyelitis are suspected. Severe midfoot degenerative changes likely neuropathic disease. IMPRESSION: 1. Open wound along the dorsal aspect of the second toe with findings consistent  with osteomyelitis and likely septic  arthritis at the DIP joint of the second toe. No discrete drainable abscess is identified. 2. Diffuse cellulitis involving the entire forefoot. 3. Diffuse myositis without findings for pyomyositis. 4. Prior amputation of the third toe. 5. Fusion across the first interphalangeal joint with a metallic screw and associated artifact. No complicating features are identified. 6. Severe midfoot degenerative changes likely neuropathic disease. Electronically Signed   By: MYRTIS Stammer M.D.   On: 03/13/2023 19:33    Microbiology: Results for orders placed or performed in visit on 06/29/21  WOUND CULTURE     Status: None   Collection Time: 06/29/21 12:00 AM   Specimen: Wound   Wound SENSITIVITY  Result Value Ref Range Status   Gram Stain Result Final report  Final   Organism ID, Bacteria Comment  Final    Comment: No white blood cells seen.   Organism ID, Bacteria Comment  Final    Comment: Few gram positive rods.   Aerobic Bacterial Culture Final report  Final   Organism ID, Bacteria Mixed skin flora  Final    Comment: Heavy growth    Labs: CBC: Recent Labs  Lab 03/13/23 1440 03/14/23 0557  WBC 6.8 5.1  NEUTROABS 4.2  --   HGB 15.7 14.6  HCT 45.0 42.4  MCV 95.1 97.5  PLT 199 171   Basic Metabolic Panel: Recent Labs  Lab 03/13/23 1440 03/14/23 0557  NA 139 139  K 4.1 3.4*  CL 100 106  CO2 27 24  GLUCOSE 203* 130*  BUN 15 13  CREATININE 0.95 0.81  CALCIUM  9.3 8.6*   Liver Function Tests: No results for input(s): AST, ALT, ALKPHOS, BILITOT, PROT, ALBUMIN in the last 168 hours. CBG: Recent Labs  Lab 03/13/23 2106 03/14/23 0724  GLUCAP 137* 138*    Signed: Amaryllis Dare, MD [facility] 03/14/2023

## 2023-03-14 NOTE — ED Notes (Signed)
 Pt is unhappy that his procedure has been postponed from today until Sunday. Family at bedside wants to know why it cannot be worked in today. Pt does not want to stay in hospital until Sunday waiting for procedure, states he wants to go home if procedure isn't today. Pt agreed to wait a couple hours to get answers about treatment plan and if there is a need for him to stay here. Pt wants to leave AMA in a couple hours if he does not get a clear reason that he needs to remain in hospital. Caleen, MD, made aware.

## 2023-03-17 ENCOUNTER — Telehealth: Payer: Self-pay

## 2023-03-17 DIAGNOSIS — E11621 Type 2 diabetes mellitus with foot ulcer: Secondary | ICD-10-CM

## 2023-03-17 NOTE — Progress Notes (Addendum)
PA started   [employer] (KeyLarae Grooms) Rx #: [id number] Need Help? Call us at [phone] Status New (Not sent to plan) Drug Repatha SureClick 140MG /ML auto-injectors ePA cloud logo Form [employer] Electronic Prior Authorization Form 2017 NCPDP Original Claim Info [phone]  Denied. Appeal submitted with current labs, and cardiology notes and our last office visit    Approved from 03/20/23-09/17/23

## 2023-03-17 NOTE — Addendum Note (Signed)
 Addended by: Raina Bunting on: 03/17/2023 05:22 PM   Modules accepted: Orders

## 2023-03-17 NOTE — Telephone Encounter (Signed)
 Please notify patient that referral has been placed. If not heard back within 1 week he can call to schedule.  Referral order signed. Notified Nikki regarding referral.   Reason for Referral: Left 2nd toe diabetic ulceration infection, previously treated by podiatry and hospital, now requesting new transfer of care to new provider at [facility]  Has the referral been discussed with the patient?: Yes  Designated contact for the referral if not the patient (name/phone number):  Has the patient seen a specialist for this issue before?: Yes  If so, who (practice/provider)? Dr Tinnie Forehand [facility] [place]  Does the patient have a provider or location preference for the referral?: Yes [facility]  [address] [place], [place] [address] [phone]   Would the patient like to see previous specialist if applicable? No    Domingo Friend, DO [facility] 03/17/2023, 5:22 PM

## 2023-03-17 NOTE — Telephone Encounter (Signed)
 Patient is requesting a new referral to [facility] for left foot 2nd toe infection. Please let patient know when referral is completed.    [address] [place], [place] [address] [phone]

## 2023-03-18 DIAGNOSIS — L97522 Non-pressure chronic ulcer of other part of left foot with fat layer exposed: Secondary | ICD-10-CM | POA: Diagnosis not present

## 2023-03-18 DIAGNOSIS — L089 Local infection of the skin and subcutaneous tissue, unspecified: Secondary | ICD-10-CM | POA: Diagnosis not present

## 2023-03-18 DIAGNOSIS — M86472 Chronic osteomyelitis with draining sinus, left ankle and foot: Secondary | ICD-10-CM | POA: Diagnosis not present

## 2023-03-18 DIAGNOSIS — M792 Neuralgia and neuritis, unspecified: Secondary | ICD-10-CM | POA: Diagnosis not present

## 2023-03-20 ENCOUNTER — Telehealth: Payer: Self-pay

## 2023-03-20 NOTE — Telephone Encounter (Signed)
Copied from CRM [id number]. Topic: Clinical - Prescription Issue >> Mar 20, 2023  9:25 AM Shon Hale wrote: Reason for CRM: [facility] calling in regards to the prior authorization for the repatha. LBL was not calculated on labs received for patient's cholesterol. Needing the LBL records and requesting they be faxed to [facility]  Fax number: [phone]

## 2023-03-20 NOTE — Telephone Encounter (Signed)
Left message for patient that labs were faxed again. They were included in the prior auth labs. Hopefully this can get approved soon.

## 2023-03-31 DIAGNOSIS — M86472 Chronic osteomyelitis with draining sinus, left ankle and foot: Secondary | ICD-10-CM | POA: Diagnosis not present

## 2023-03-31 DIAGNOSIS — L089 Local infection of the skin and subcutaneous tissue, unspecified: Secondary | ICD-10-CM | POA: Diagnosis not present

## 2023-03-31 DIAGNOSIS — M86172 Other acute osteomyelitis, left ankle and foot: Secondary | ICD-10-CM | POA: Diagnosis not present

## 2023-04-01 ENCOUNTER — Other Ambulatory Visit: Payer: Self-pay | Admitting: Family Medicine

## 2023-04-01 DIAGNOSIS — I10 Essential (primary) hypertension: Secondary | ICD-10-CM

## 2023-04-04 ENCOUNTER — Ambulatory Visit: Payer: PPO

## 2023-04-04 DIAGNOSIS — Z0000 Encounter for general adult medical examination without abnormal findings: Secondary | ICD-10-CM

## 2023-04-04 NOTE — Patient Instructions (Addendum)
 Kevin Perry , Thank you for taking time to come for your Medicare Wellness Visit. I appreciate your ongoing commitment to your health goals. Please review the following plan we discussed and let me know if I can assist you in the future.   Referrals/Orders/Follow-Ups/Clinician Recommendations: NONE  This is a list of the screening recommended for you and due dates:  Health Maintenance  Topic Date Due   DTaP/Tdap/Td vaccine (1 - Tdap) Never done   COVID-19 Vaccine (5 - 2024-25 season) 10/06/2022   Zoster (Shingles) Vaccine (1 of 2) 06/06/2023*   Eye exam for diabetics  07/05/2023   Complete foot exam   07/15/2023   Hemoglobin A1C  07/18/2023   Yearly kidney health urinalysis for diabetes  01/17/2024   Yearly kidney function blood test for diabetes  03/13/2024   Medicare Annual Wellness Visit  04/03/2024   Colon Cancer Screening  08/26/2032   Pneumonia Vaccine  Completed   Flu Shot  Completed   Hepatitis C Screening  Completed   HPV Vaccine  Aged Out   Cologuard (Stool DNA test)  Discontinued  *Topic was postponed. The date shown is not the original due date.    Advanced directives: (ACP Link)Information on Advanced Care Planning can be found at [facility] [place] Advance Health Care Directives Advance Health Care Directives ([email or web address]   Next Medicare Annual Wellness Visit scheduled for next year: Yes   04/09/24 @ 2:40 PM BY PHONE

## 2023-04-04 NOTE — Progress Notes (Signed)
 Subjective:   Kevin Perry is a 73 y.o. who presents for a Medicare Wellness preventive visit.  Visit Complete: Virtual I connected with  [name] on 04/04/23 by a audio enabled telemedicine application and verified that I am speaking with the correct person using two identifiers.  Patient Location: Home  Provider Location: Office/Clinic  I discussed the limitations of evaluation and management by telemedicine. The patient expressed understanding and agreed to proceed.  Vital Signs: Because this visit was a virtual/telehealth visit, some criteria may be missing or patient reported. Any vitals not documented were not able to be obtained and vitals that have been documented are patient reported.  VideoDeclined- This patient declined Librarian, academic. Therefore the visit was completed with audio only.  AWV Questionnaire: No: Patient Medicare AWV questionnaire was not completed prior to this visit.  Cardiac Risk Factors include: advanced age (>61men, >68 women);diabetes mellitus;dyslipidemia;hypertension;male gender;obesity (BMI >30kg/m2)     Objective:    Today's Vitals   04/04/23 1517  PainSc: 0-No pain   There is no height or weight on file to calculate BMI.     04/04/2023    3:22 PM 03/13/2023    2:35 PM 08/27/2022    7:57 AM 11/17/2019    7:18 AM 01/01/2019    1:58 PM 11/24/2014    3:37 PM 10/12/2014    2:36 PM  Advanced Directives  Does Patient Have a Medical Advance Directive? No No No No No No No  Would patient like information on creating a medical advance directive? No - Patient declined  No - Patient declined No - Patient declined  No - patient declined information No - patient declined information    Current Medications (verified) Outpatient Encounter Medications as of 04/04/2023  Medication Sig   amLODipine (NORVASC) 10 MG tablet TAKE 1 TABLET BY MOUTH ONCE DAILY   aspirin EC 81 MG tablet Take 81 mg by mouth daily. Swallow  whole.   baclofen (LIORESAL) 10 MG tablet Take 0.5-1 tablets (5-10 mg total) by mouth 3 (three) times daily as needed for muscle spasms.   buPROPion (WELLBUTRIN XL) 300 MG 24 hr tablet Take 300 mg by mouth daily.   Cholecalciferol (VITAMIN D3) 125 MCG (5000 UT) CAPS Take 1 capsule (5,000 Units total) by mouth daily. For 8 weeks, then start Vitamin D3 2,000 units daily (OTC)   cyanocobalamin (VITAMIN B12) 1000 MCG tablet Take 1 tablet (1,000 mcg total) by mouth daily.   empagliflozin (JARDIANCE) 25 MG TABS tablet Take 1 tablet by mouth daily.   escitalopram (LEXAPRO) 20 MG tablet TAKE 1 TABLET BY MOUTH ONCE DAILY AS DIRECTED TAPER AS DIRECTED. OFF ZOLOFT AND ON TO ESCITALOPRAM   folic acid (FOLVITE) 1 MG tablet Take 1 mg by mouth See admin instructions. Take 1 mg daily except skip dose on Wednesdays (methotrexate day)   furosemide (LASIX) 40 MG tablet TAKE 1 TABLET BY MOUTH ONCE DAILY (Patient taking differently: Take 40 mg by mouth 2 (two) times daily.)   gabapentin (NEURONTIN) 100 MG capsule TAKE 1 CAPSULE BY MOUTH TWICE DAILY   lisinopril (ZESTRIL) 20 MG tablet Take 1 tablet by mouth 2 (two) times daily.   loratadine (CLARITIN) 10 MG tablet Take 10 mg by mouth every morning.   metFORMIN (GLUCOPHAGE) 500 MG tablet TAKE 2 TABLETS BY MOUTH ONCE EVERY MORNING WITH BREAKFAST AND 1 TABLET ONCEEVERY EVENING WITH DINNER   methotrexate (RHEUMATREX) 2.5 MG tablet Take 10 mg by mouth once a week. On  Wednesdays   metoprolol tartrate (LOPRESSOR) 25 MG tablet TAKE 1 TABLET BY MOUTH TWICE DAILY   nitroGLYCERIN (NITROSTAT) 0.4 MG SL tablet Place under the tongue.   TRULICITY 3 MG/0.5ML SOPN INJECT 3 MG (0.5 ML) UNDER THE SKIN ONCE A WEEK   [DISCONTINUED] buPROPion (WELLBUTRIN XL) 300 MG 24 hr tablet TAKE 1 TABLET BY MOUTH ONCE DAILY DOSE INCREASE   REPATHA SURECLICK 140 MG/ML SOAJ Inject 140 mg into the skin every 14 (fourteen) days.   No facility-administered encounter medications on file as of 04/04/2023.      Allergies (verified) Naproxen and Rosuvastatin   History: Past Medical History:  Diagnosis Date   Anxiety    Arthritis    Coronary artery disease    Diabetes mellitus without complication (HCC)    Hypertension    Hypertensive retinopathy    Myocardial infarction [facility])    Postoperative nausea 11/27/2019   Past Surgical History:  Procedure Laterality Date   BIOPSY  08/27/2022   Procedure: BIOPSY;  Surgeon: Regis Bill, MD;  Location: [facility];  Service: Endoscopy;;   CATARACT EXTRACTION     CHOLECYSTECTOMY     COLONOSCOPY WITH PROPOFOL N/A 08/27/2022   Procedure: COLONOSCOPY WITH PROPOFOL;  Surgeon: Regis Bill, MD;  Location: [facility];  Service: Endoscopy;  Laterality: N/A;   CORONARY ANGIOPLASTY WITH STENT PLACEMENT N/A 02/04/2006   ESOPHAGOGASTRODUODENOSCOPY (EGD) WITH PROPOFOL N/A 08/27/2022   Procedure: ESOPHAGOGASTRODUODENOSCOPY (EGD) WITH PROPOFOL;  Surgeon: Regis Bill, MD;  Location: [facility];  Service: Endoscopy;  Laterality: N/A;   EYE SURGERY     FOOT SURGERY Left    DOS 8.1.14 HALLIX IPJ FUSION, SECOND MET OSTEOTOMY W/SCREW, HAM TOE REPAIR 2,,4 , PARTIAL AMP 3RD DIGIT    GALLBLADDER SURGERY     HEMOSTASIS CLIP PLACEMENT  08/27/2022   Procedure: HEMOSTASIS CLIP PLACEMENT;  Surgeon: Regis Bill, MD;  Location: [facility];  Service: Endoscopy;;   KNEE SURERY Right    LEFT HEART CATH AND CORONARY ANGIOGRAPHY Left 11/17/2019   Procedure: LEFT HEART CATH AND CORONARY ANGIOGRAPHY;  Surgeon: Dalia Heading, MD;  Location: [facility];  Service: Cardiovascular;  Laterality: Left;   MYRINGOTOMY WITH TUBE PLACEMENT Right 10/25/2021   Procedure: MYRINGOTOMY WITH BUTTERFLY TUBE PLACEMENT;  Surgeon: Vernie Murders, MD;  Location: [facility];  Service: ENT;  Laterality: Right;   POLYPECTOMY  08/27/2022   Procedure: POLYPECTOMY;  Surgeon: Regis Bill, MD;  Location: [facility];  Service: Endoscopy;;    SUBMUCOSAL LIFTING INJECTION  08/27/2022   Procedure: SUBMUCOSAL LIFTING INJECTION;  Surgeon: Regis Bill, MD;  Location: [facility];  Service: Endoscopy;;  ELEVIEW ASCENDING COLON POLYP   TOTAL KNEE ARTHROPLASTY Right 08/30/2014   Procedure: TOTAL KNEE ARTHROPLASTY;  Surgeon: Kennedy Bucker, MD;  Location: [facility];  Service: Orthopedics;  Laterality: Right;   Family History  Problem Relation Age of Onset   Heart attack Mother    Diabetes Father    Hypertension Father    Heart attack Sister    Social History   Socioeconomic History   Marital status: Married    Spouse name: Not on file   Number of children: Not on file   Years of education: Not on file   Highest education level: Bachelor's degree (e.g., BA, AB, BS)  Occupational History   Not on file  Tobacco Use   Smoking status: Former    Current packs/day: 0.00    Types: Cigarettes    Quit date: 08/16/2004  Years since quitting: 18.6   Smokeless tobacco: Former    Quit date: 2006   Tobacco comments:    quit 20 plus years  Vaping Use   Vaping status: Never Used  Substance and Sexual Activity   Alcohol use: Not Currently    Alcohol/week: 2.0 standard drinks of alcohol    Types: 2 Cans of beer per week    Comment: SOCIAL   Drug use: No   Sexual activity: Not Currently  Other Topics Concern   Not on file  Social History Narrative   Not on file   Social Drivers of Health   Financial Resource Strain: Low Risk  (04/04/2023)   Overall Financial Resource Strain (CARDIA)    Difficulty of Paying Living Expenses: Not hard at all  Food Insecurity: No Food Insecurity (04/04/2023)   Hunger Vital Sign    Worried About Running Out of Food in the Last Year: Never true    Ran Out of Food in the Last Year: Never true  Transportation Needs: No Transportation Needs (04/04/2023)   PRAPARE - Administrator, Civil Service (Medical): No    Lack of Transportation (Non-Medical): No  Physical Activity:  Sufficiently Active (04/04/2023)   Exercise Vital Sign    Days of Exercise per Week: 7 days    Minutes of Exercise per Session: 50 min  Recent Concern: Physical Activity - Inactive (01/20/2023)   Exercise Vital Sign    Days of Exercise per Week: 0 days    Minutes of Exercise per Session: 60 min  Stress: No Stress Concern Present (04/04/2023)   [employer] of Occupational Health - Occupational Stress Questionnaire    Feeling of Stress : Not at all  Social Connections: Socially Integrated (04/04/2023)   Social Connection and Isolation Panel [NHANES]    Frequency of Communication with Friends and Family: More than three times a week    Frequency of Social Gatherings with Friends and Family: Once a week    Attends Religious Services: More than 4 times per year    Active Member of [employer] or Organizations: Yes    Attends Engineer, structural: More than 4 times per year    Marital Status: Married    Tobacco Counseling Counseling given: Not Answered Tobacco comments: quit 20 plus years    Clinical Intake:  Pre-visit preparation completed: Yes  Pain : No/denies pain Pain Score: 0-No pain     BMI - recorded: 33.9 Nutritional Status: BMI > 30  Obese Nutritional Risks: None Diabetes: Yes CBG done?: No Did pt. bring in CBG monitor from home?: No  How often do you need to have someone help you when you read instructions, pamphlets, or other written materials from your doctor or pharmacy?: 1 - Never  Interpreter Needed?: No  Information entered by :: Kennedy Bucker, LPN   Activities of Daily Living     04/04/2023    3:22 PM 03/31/2023    9:16 AM  In your present state of health, do you have any difficulty performing the following activities:  Hearing? 0 0  Vision? 0 0  Difficulty concentrating or making decisions? 0 0  Walking or climbing stairs? 0 1  Dressing or bathing? 0 0  Doing errands, shopping? 0 0  Preparing Food and eating ? N N  Using the Toilet? N N   In the past six months, have you accidently leaked urine? N N  Do you have problems with loss of bowel control? N N  Managing your Medications? N N  Managing your Finances? N N  Housekeeping or managing your Housekeeping? N N    Patient Care Team: Smitty Cords, DO as PCP - General (Family Medicine) Ronney Asters, Jackelyn Poling, RPH-CPP (Pharmacist) [facility], [facility]  Indicate any recent Medical Services you may have received from other than [facility] providers in the past year (date may be approximate).     Assessment:   This is a routine wellness examination for [place].  Hearing/Vision screen Hearing Screening - Comments:: NO AIDS Vision Screening - Comments:: WEARS GLASSES ALL THE TIME- [place]   Goals Addressed             This Visit's Progress    DIET - EAT MORE FRUITS AND VEGETABLES         Depression Screen     04/04/2023    3:20 PM 01/20/2023    9:09 AM 12/24/2022    2:14 PM 07/15/2022   10:19 AM 07/18/2021    8:53 AM 06/20/2021    9:01 AM 03/08/2021    9:46 AM  PHQ 2/9 Scores  PHQ - 2 Score 0 0 0 0 5 6 0  PHQ- 9 Score 0  0 1 10 13  0    Fall Risk     04/04/2023    3:22 PM 03/31/2023    9:16 AM 01/20/2023    9:09 AM 07/15/2022   10:20 AM 07/18/2021    8:53 AM  Fall Risk   Falls in the past year? 1 1 1 1  0  Number falls in past yr: 0 0 1 1 0  Injury with Fall? 0  0 1 0  Risk for fall due to : No Fall Risks   Impaired balance/gait No Fall Risks  Follow up Falls prevention discussed;Falls evaluation completed   Falls evaluation completed Falls evaluation completed    MEDICARE RISK AT HOME:  Medicare Risk at Home Any stairs in or around the home?: No If so, are there any without handrails?: No Home free of loose throw rugs in walkways, pet beds, electrical cords, etc?: Yes Adequate lighting in your home to reduce risk of falls?: Yes Life alert?: No Use of a cane, walker or w/c?: No Grab bars in the bathroom?: Yes Shower chair or bench in  shower?: No Elevated toilet seat or a handicapped toilet?: Yes  TIMED UP AND GO:  Was the test performed?  No  Cognitive Function: 6CIT completed        04/04/2023    3:28 PM  6CIT Screen  What Year? 0 points  What month? 0 points  What time? 0 points  Count back from 20 0 points  Months in reverse 0 points  Repeat phrase 0 points  Total Score 0 points    Immunizations Immunization History  Administered Date(s) Administered   Fluad Quad(high Dose 65+) 10/28/2018, 12/04/2020   Fluad Trivalent(High Dose 65+) 10/18/2022   Influenza, High Dose Seasonal PF 11/30/2019   PFIZER Comirnaty(Gray Top)Covid-19 Tri-Sucrose Vaccine 03/26/2019, 04/16/2019   PFIZER(Purple Top)SARS-COV-2 Vaccination 03/26/2019, 04/16/2019   PNEUMOCOCCAL CONJUGATE-20 01/20/2023   Pneumococcal Conjugate-13 12/10/2016   Pneumococcal Polysaccharide-23 12/31/2017    Screening Tests Health Maintenance  Topic Date Due   DTaP/Tdap/Td (1 - Tdap) Never done   COVID-19 Vaccine (5 - 2024-25 season) 10/06/2022   Zoster Vaccines- Shingrix (1 of 2) 06/06/2023 (Originally 05/26/1969)   OPHTHALMOLOGY EXAM  07/05/2023   FOOT EXAM  07/15/2023   HEMOGLOBIN A1C  07/18/2023   Diabetic  kidney evaluation - Urine ACR  01/17/2024   Diabetic kidney evaluation - eGFR measurement  03/13/2024   Medicare Annual Wellness (AWV)  04/03/2024   Colonoscopy  08/26/2032   Pneumonia Vaccine 35+ Years old  Completed   INFLUENZA VACCINE  Completed   Hepatitis C Screening  Completed   HPV VACCINES  Aged Out   Fecal DNA (Cologuard)  Discontinued    Health Maintenance  Health Maintenance Due  Topic Date Due   DTaP/Tdap/Td (1 - Tdap) Never done   COVID-19 Vaccine (5 - 2024-25 season) 10/06/2022   Health Maintenance Items Addressed: UP TO DATE W/ COLONOSCOPY  Additional Screening:  Vision Screening: Recommended annual ophthalmology exams for early detection of glaucoma and other disorders of the eye.  Dental Screening:  Recommended annual dental exams for proper oral hygiene  Community Resource Referral / Chronic Care Management: CRR required this visit?  No   CCM required this visit?  No     Plan:     I have personally reviewed and noted the following in the patient's chart:   Medical and social history Use of alcohol, tobacco or illicit drugs  Current medications and supplements including opioid prescriptions. Patient is not currently taking opioid prescriptions. Functional ability and status Nutritional status Physical activity Advanced directives List of other physicians Hospitalizations, surgeries, and ER visits in previous 12 months Vitals Screenings to include cognitive, depression, and falls Referrals and appointments  In addition, I have reviewed and discussed with patient certain preventive protocols, quality metrics, and best practice recommendations. A written personalized care plan for preventive services as well as general preventive health recommendations were provided to patient.     Hal Hope, LPN   1/61/0960   After Visit Summary: (MyChart) Due to this being a telephonic visit, the after visit summary with patients personalized plan was offered to patient via MyChart   Notes: Nothing significant to report at this time.

## 2023-04-08 DIAGNOSIS — M86472 Chronic osteomyelitis with draining sinus, left ankle and foot: Secondary | ICD-10-CM | POA: Diagnosis not present

## 2023-04-08 DIAGNOSIS — L97522 Non-pressure chronic ulcer of other part of left foot with fat layer exposed: Secondary | ICD-10-CM | POA: Diagnosis not present

## 2023-04-15 DIAGNOSIS — L97522 Non-pressure chronic ulcer of other part of left foot with fat layer exposed: Secondary | ICD-10-CM | POA: Diagnosis not present

## 2023-04-15 DIAGNOSIS — M86472 Chronic osteomyelitis with draining sinus, left ankle and foot: Secondary | ICD-10-CM | POA: Diagnosis not present

## 2023-04-21 ENCOUNTER — Other Ambulatory Visit (INDEPENDENT_AMBULATORY_CARE_PROVIDER_SITE_OTHER): Payer: Self-pay | Admitting: Pharmacist

## 2023-04-21 DIAGNOSIS — Z7985 Long-term (current) use of injectable non-insulin antidiabetic drugs: Secondary | ICD-10-CM

## 2023-04-21 DIAGNOSIS — Z7984 Long term (current) use of oral hypoglycemic drugs: Secondary | ICD-10-CM

## 2023-04-21 DIAGNOSIS — E1169 Type 2 diabetes mellitus with other specified complication: Secondary | ICD-10-CM

## 2023-04-21 DIAGNOSIS — I10 Essential (primary) hypertension: Secondary | ICD-10-CM

## 2023-04-21 NOTE — Patient Instructions (Signed)
 Goals Addressed             This Visit's Progress    Pharmacy Goals       Our goal A1c is less than 7%. This corresponds with fasting sugars less than 130 and 2 hour after meal sugars less than 180. Please check your blood sugar and keep log of the results   Please check your home blood pressure, keep a log of the results and have this to review during our next appointment.  Feel free to call me with any questions or concerns. I look forward to our next call!   Estelle Grumbles, PharmD, [facility] Clinical Pharmacist [facility] [phone]

## 2023-04-21 NOTE — Progress Notes (Signed)
 04/21/2023 Name: Kevin Perry MRN: [id number] DOB: 1950/04/07  Chief Complaint  Patient presents with   Medication Management   Medication Assistance    Kevin Perry is a 73 y.o. year old male who presented for a telephone visit.   They were referred to the pharmacist by their PCP for assistance in managing diabetes, hypertension, hyperlipidemia, and medication access.      Subjective:   Care Team: Primary Care Provider: Smitty Cords, DO; Next Scheduled Visit: 08/04/2023 Cardiologist: Clotilde Dieter, DO; Next Scheduled Visit: 05/22/2023 Gastroenterology: Kevin Lent, MD    Medication Access/Adherence  Current Pharmacy:  [place] - [place], [place] - [address] [place] [address] Phone: [phone] Fax: [phone]  [facility] [address], [place] - [address] [place] [address] Phone: [phone] Fax: [phone]  [facility] - [place], [place] - One [facility] AT Portal to Registered [address] [place] [address] Phone: [phone] Fax: [phone]  [facility] - [place], [place] - [address] [place] [address] Phone: [phone] Fax: [phone]  [id number] - [place], [place] - [place] [address] AT [facility] [address] [place] [address] Phone: [phone] Fax: [phone]   Patient reports affordability concerns with their medications: Yes Patient reports access/transportation concerns to their pharmacy: No  Patient reports adherence concerns with their medications:  No     Reports recently has been off of Repatha due to cost. Note in January helped patient with enrollment in [facility] grant for access support, but reports has not yet picked up from pharmacy   Reports cost of London Pepper has become difficult to  afford  Diabetes:   Current medications:  Metformin IR 500mg  x 2 in AM / x 1 in PM Trulicity 3 mg weekly Jardiance 25 mg daily   Medications tried in the past: glipizide     Morning fasting glucose recently ranging 120-140    Patient denies hypoglycemic s/sx including dizziness, shakiness, sweating.      Current physical activity: reports movement limited since surgery on toe   Current medication access support: Enrolled for Trulicity from [facility] Patient Assistance Program through 02/04/2024     Hypertension:   Current medications:  Amlodipine 10mg  daily Furosemide 40 mg once daily Lisinopril 20 mg twice daily Metoprolol tartrate 25 mg twice daily   Patient has an automated, upper arm home BP cuff   Reports home blood pressure reading yesterday: 121/78   Current physical activity: reports movement limited since surgery on toe     Hyperlipidemia/ASCVD Risk Reduction   Current lipid lowering medications: Repatha Sureclick 140 mg every 14 days                                                  Note Repatha PA approved through 02/04/2024    Medications tried in the past: atorvastatin (myopathy)   Antiplatelet regimen: aspirin 81 mg daily   ASCVD History: CAD with prior CABG and PCI    Current physical activity: reports movement limited since surgery on toe   Current Medication Access Support: [employer] Hypercholesteremia- Medicare Access grant - patient approved through 01/24/2024  Objective:  Lab  Results  Component Value Date   HGBA1C 7.9 (H) 01/17/2023    Lab Results  Component Value Date   CREATININE 0.81 03/14/2023   BUN 13 03/14/2023   NA 139 03/14/2023   K 3.4 (L) 03/14/2023   CL 106 03/14/2023   CO2 24 03/14/2023    Lab Results  Component Value Date   CHOL 172 01/17/2023   HDL 29 (L) 01/17/2023   LDLCALC  01/17/2023     Comment:     . LDL cholesterol not calculated. Triglyceride levels greater than 400 mg/dL invalidate calculated  LDL results. . Reference range: <100 . Desirable range <100 mg/dL for primary prevention;   <70 mg/dL for patients with CHD or diabetic patients  with > or = 2 CHD risk factors. Kevin Perry LDL-C is now calculated using the Martin-Hopkins  calculation, which is a validated novel method providing  better accuracy than the Friedewald equation in the  estimation of LDL-C.  Horald Pollen et al. Kevin Perry. 1191;478(29): 2061-2068  (http://education.Quest[facility].com/faq/FAQ164)    TRIG 580 (H) 01/17/2023   CHOLHDL 5.9 (H) 01/17/2023   BP Readings from Last 3 Encounters:  03/14/23 (!) 151/92  01/20/23 130/72  12/24/22 122/62   Pulse Readings from Last 3 Encounters:  03/14/23 61  01/20/23 66  10/18/22 61    Medications Reviewed Today     Reviewed by Manuela Neptune, RPH-CPP (Pharmacist) on 04/21/23 at 1324  Med List Status: <None>   Medication Order Taking? Sig Documenting Provider Last Dose Status Informant  amLODipine (NORVASC) 10 MG tablet [id number] Yes TAKE 1 TABLET BY MOUTH ONCE DAILY Smitty Cords, DO Taking Active   aspirin EC 81 MG tablet [id number]  Take 81 mg by mouth daily. Swallow whole. [provider]  Active Spouse/Significant Other  baclofen (LIORESAL) 10 MG tablet [id number]  Take 0.5-1 tablets (5-10 mg total) by mouth 3 (three) times daily as needed for muscle spasms. Smitty Cords, DO  Active Spouse/Significant Other  buPROPion (WELLBUTRIN XL) 300 MG 24 hr tablet [id number]  Take 300 mg by mouth daily. [provider]  Active   Cholecalciferol (VITAMIN D3) 125 MCG (5000 UT) CAPS [id number]  Take 1 capsule (5,000 Units total) by mouth daily. For 8 weeks, then start Vitamin D3 2,000 units daily (OTC) Karamalegos, Netta Neat, DO  Active Spouse/Significant Other  cyanocobalamin (VITAMIN B12) 1000 MCG tablet [id number]  Take 1 tablet (1,000 mcg total) by mouth daily. Smitty Cords, DO  Active Spouse/Significant Other  empagliflozin  (JARDIANCE) 25 MG TABS tablet [id number] Yes Take 1 tablet by mouth daily. [provider] Taking Active Spouse/Significant Other  escitalopram (LEXAPRO) 20 MG tablet [id number]  TAKE 1 TABLET BY MOUTH ONCE DAILY AS DIRECTED TAPER AS DIRECTED. OFF ZOLOFT AND ON TO ESCITALOPRAM Smitty Cords, DO  Active Spouse/Significant Other  Evolocumab (REPATHA SURECLICK) 140 MG/ML SOAJ [id number] Yes Inject 140 mg into the skin every 14 (fourteen) days. [provider]  Active   folic acid (FOLVITE) 1 MG tablet [id number]  Take 1 mg by mouth See admin instructions. Take 1 mg daily except skip dose on Wednesdays (methotrexate day) [provider]  Active Self, Spouse/Significant Other  furosemide (LASIX) 40 MG tablet [id number] Yes TAKE 1 TABLET BY MOUTH ONCE DAILY Althea Charon, Netta Neat, DO Taking Active Spouse/Significant Other  gabapentin (NEURONTIN) 100 MG capsule [id number]  TAKE 1 CAPSULE BY MOUTH TWICE DAILY Baity, Salvadore [place], NP  Active Spouse/Significant Other  lisinopril (ZESTRIL) 20 MG tablet [id number] Yes Take 1 tablet  by mouth 2 (two) times daily. [provider] Taking Active Spouse/Significant Other  loratadine (CLARITIN) 10 MG tablet [id number]  Take 10 mg by mouth every morning. [provider]  Active Self, Spouse/Significant Other  metFORMIN (GLUCOPHAGE) 500 MG tablet [id number] Yes TAKE 2 TABLETS BY MOUTH ONCE EVERY MORNING WITH BREAKFAST AND 1 TABLET ONCEEVERY EVENING WITH DINNER Karamalegos, Netta Neat, DO Taking Active Spouse/Significant Other  methotrexate (RHEUMATREX) 2.5 MG tablet [id number]  Take 10 mg by mouth once a week. On Wednesdays [provider]  Active Spouse/Significant Other  metoprolol tartrate (LOPRESSOR) 25 MG tablet [id number] Yes TAKE 1 TABLET BY MOUTH TWICE DAILY Karamalegos, Netta Neat, DO Taking Active Spouse/Significant Other  nitroGLYCERIN (NITROSTAT) 0.4 MG SL tablet [id number]  Place under the tongue. [provider]  Active Spouse/Significant Other  TRULICITY 3 MG/0.5ML SOPN [id number] Yes INJECT 3 MG (0.5 ML) UNDER THE SKIN ONCE A WEEK Karamalegos, Netta Neat, DO Taking Active Spouse/Significant Other              Assessment/Plan:   Diabetes: - Counseled on importance of having well-balanced meals, while controlling carbohydrate portion sizes - Recommend to continue to check glucose and keep a log of the results - Meets financial criteria for Jardiance patient assistance program through [facility]. Will collaborate with provider, CPhT, and patient to pursue assistance.     Hypertension: - Have reviewed appropriate blood pressure monitoring technique and reviewed goal blood pressure.  - Recommend patient to continue to monitor home BP, keep log of results including HR readings and bring this record to medical appointments      Hyperlipidemia/ASCVD Risk Reduction: - Outreach to [facility] and speak with RPh Sam. Confirm Repatha goes through patient's Medicare and then balance to [employer] grant for no charge to patient - Patient to follow up with [facility] regarding pick up of Repatha. Note have provided grant billing information to patient's pharmacy Patient aware to provide to pharmacy and advise pharmacy to bill remaining balance of cost for Repatha prescription to [facility] grant ONLY AFTER billed to Medicare      Follow Up Plan: Clinical Pharmacist will follow up with patient by telephone on 05/19/2023 at 1:00 PM    Estelle Grumbles, PharmD, Patsy Baltimore, CPP Clinical Pharmacist [facility] [phone]

## 2023-04-22 DIAGNOSIS — M86472 Chronic osteomyelitis with draining sinus, left ankle and foot: Secondary | ICD-10-CM | POA: Diagnosis not present

## 2023-04-22 DIAGNOSIS — L97522 Non-pressure chronic ulcer of other part of left foot with fat layer exposed: Secondary | ICD-10-CM | POA: Diagnosis not present

## 2023-04-29 ENCOUNTER — Telehealth: Payer: Self-pay

## 2023-04-29 NOTE — Telephone Encounter (Signed)
 PAP: Patient assistance application for Jardiance through [employer]) has been mailed to pt's home address on file. Provider portion of application will be faxed to provider's office.

## 2023-05-01 ENCOUNTER — Other Ambulatory Visit: Payer: Self-pay | Admitting: Family Medicine

## 2023-05-01 DIAGNOSIS — F331 Major depressive disorder, recurrent, moderate: Secondary | ICD-10-CM

## 2023-05-02 NOTE — Telephone Encounter (Signed)
 Received provider portion have index to media today.

## 2023-05-02 NOTE — Telephone Encounter (Signed)
 Requested medications are due for refill today.  yes  Requested medications are on the active medications list.  yes  Last refill. 02/06/2023 #90 0 rf  Future visit scheduled.   yes  Notes to clinic.  Please update sig.     Requested Prescriptions  Pending Prescriptions Disp Refills   escitalopram (LEXAPRO) 20 MG tablet [Pharmacy Med Name: ESCITALOPRAM OXALATE 20 MG TAB] 90 tablet 0    Sig: TAKE 1 TABLET BY MOUTH ONCE DAILY AS DIRECTED TAPER AS DIRECTED. OFF ZOLOFT AND ON TO ESCITALOPRAM     Psychiatry:  Antidepressants - SSRI Passed - 05/02/2023  4:13 PM      Passed - Completed PHQ-2 or PHQ-9 in the last 360 days      Passed - Valid encounter within last 6 months    Recent Outpatient Visits   None     Future Appointments             In 3 months Kevin Perry, Kevin Neat, DO [facility]

## 2023-05-09 NOTE — Telephone Encounter (Signed)
 Reached out to patient for follow up on the PAP application for Jardiance if received in the mail- left HIPAA compliant V/M.

## 2023-05-15 DIAGNOSIS — N529 Male erectile dysfunction, unspecified: Secondary | ICD-10-CM | POA: Diagnosis not present

## 2023-05-15 NOTE — Telephone Encounter (Signed)
 PAP: Application for London Pepper has been submitted to [employer]), via fax

## 2023-05-19 ENCOUNTER — Other Ambulatory Visit: Admitting: Pharmacist

## 2023-05-19 DIAGNOSIS — Z7985 Long-term (current) use of injectable non-insulin antidiabetic drugs: Secondary | ICD-10-CM

## 2023-05-19 DIAGNOSIS — Z7984 Long term (current) use of oral hypoglycemic drugs: Secondary | ICD-10-CM

## 2023-05-19 DIAGNOSIS — I10 Essential (primary) hypertension: Secondary | ICD-10-CM

## 2023-05-19 DIAGNOSIS — E1169 Type 2 diabetes mellitus with other specified complication: Secondary | ICD-10-CM

## 2023-05-19 NOTE — Patient Instructions (Signed)
 Goals Addressed             This Visit's Progress    Pharmacy Goals       Our goal A1c is less than 7%. This corresponds with fasting sugars less than 130 and 2 hour after meal sugars less than 180. Please check your blood sugar and keep log of the results   Please check your home blood pressure, keep a log of the results and have this to review during our next appointment.  Feel free to call me with any questions or concerns. I look forward to our next call!   Estelle Grumbles, PharmD, [facility] Clinical Pharmacist [facility] [phone]

## 2023-05-19 NOTE — Progress Notes (Signed)
 05/19/2023 Name: Kevin Perry MRN: [id number] DOB: [birth year]  Chief Complaint  Patient presents with   Medication Management   Medication Assistance    Kevin Perry is a 73 y.o. year old male who presented for a telephone visit.   They were referred to the pharmacist by their PCP for assistance in managing diabetes, hypertension, hyperlipidemia, and medication access.      Subjective:   Care Team: Primary Care Provider: Smitty Cords, DO; Next Scheduled Visit: 08/04/2023 Cardiologist: Clotilde Dieter, DO; Next Scheduled Visit: 05/22/2023 Gastroenterology: Darryl Lent, MD  Medication Access/Adherence  Current Pharmacy:  Marg[facility] - [place], [place] - [address] [place] [address] Phone: [phone] Fax: [phone]  [facility] [address] [place] - [address] [place] [address] Phone: [phone] Fax: [phone]  [facility] - [place], [place] - One [facility] AT Portal to Registered [address] [place] [address] Phone: [phone] Fax: [phone]  [facility] [place] - [place], [place] - [address] [place] [address] Phone: [phone] Fax: [phone]  [id number] - [place], [place] - [address] [place] [address] AT [facility] OF [address] [place] [address] Phone: [phone] Fax: [phone]   Patient reports affordability concerns with their medications: Yes Patient reports access/transportation concerns to their pharmacy: No  Patient reports adherence concerns with their medications:  No     From review of chart, note CPhT Star Age received both provider and patient portion of Jardiance patient assistance application back and submitted this application to [facility] on 05/15/2023  Diabetes:   Current medications:  Metformin  IR 500mg  x 2 in AM / x 1 in PM Trulicity 3 mg weekly Jardiance 25 mg daily   Medications tried in the past: glipizide     Morning fasting glucose recently ranging 120-140    Patient denies hypoglycemic s/sx including dizziness, shakiness, sweating.     Current medication access support:  - Enrolled for Trulicity from [facility] Patient Assistance Program through 02/04/2024 - Collaborating with provider, CPhT, and patient to pursue assistance for Jardiance from [employer]   Hypertension:   Current medications:  Amlodipine 10mg  daily Furosemide 40 mg once daily Lisinopril 20 mg twice daily Metoprolol tartrate 25 mg twice daily   Patient has an automated, upper arm home BP cuff   Reports home blood pressure reading yesterday evening: 110/80, HR 86      Hyperlipidemia/ASCVD Risk Reduction   Current lipid lowering medications: Repatha Sureclick 140 mg every 14 days                                                  Note Repatha PA approved through 01/2024      Confirms picked up and restarted last month   Medications tried in the past: atorvastatin (myopathy)   Antiplatelet regimen: aspirin 81 mg daily   ASCVD History: CAD with prior CABG and PCI    Current Medication Access Support: [employer] Hypercholesteremia- Medicare Access grant - patient approved through 01/24/2024   Objective:  Lab Results  Component Value Date   HGBA1C 7.9 (H) 01/17/2023    Lab Results  Component Value Date  CREATININE 0.81 03/14/2023   BUN 13 03/14/2023   NA 139 03/14/2023   K 3.4 (L) 03/14/2023   CL 106 03/14/2023   CO2 24 03/14/2023    Lab Results  Component Value Date   CHOL 172 01/17/2023   HDL 29 (L) 01/17/2023   LDLCALC  01/17/2023     Comment:     . LDL cholesterol not calculated. Triglyceride levels greater than 400 mg/dL invalidate calculated LDL results. . Reference range: <100 . Desirable range <100 mg/dL for primary prevention;   <70 mg/dL for patients with  CHD or diabetic patients  with > or = 2 CHD risk factors. Marland Kitchen LDL-C is now calculated using the Martin-Hopkins  calculation, which is a validated novel method providing  better accuracy than the Friedewald equation in the  estimation of LDL-C.  Horald Pollen et al. Lenox Ahr. 2952;841(32): 2061-2068  (http://education.Quest[facility].com/faq/FAQ164)    TRIG 580 (H) 01/17/2023   CHOLHDL 5.9 (H) 01/17/2023    Medications Reviewed Today     Reviewed by Manuela Neptune, RPH-CPP (Pharmacist) on 05/19/23 at 1317  Med List Status: <None>   Medication Order Taking? Sig Documenting Provider Last Dose Status Informant  amLODipine (NORVASC) 10 MG tablet [id number]  TAKE 1 TABLET BY MOUTH ONCE DAILY Smitty Cords, DO  Active   aspirin EC 81 MG tablet [id number]  Take 81 mg by mouth daily. Swallow whole. [provider]  Active Spouse/Significant Other  baclofen (LIORESAL) 10 MG tablet [id number]  Take 0.5-1 tablets (5-10 mg total) by mouth 3 (three) times daily as needed for muscle spasms. Smitty Cords, DO  Active Spouse/Significant Other  buPROPion (WELLBUTRIN XL) 300 MG 24 hr tablet [id number]  Take 300 mg by mouth daily. [provider]  Active   Cholecalciferol (VITAMIN D3) 125 MCG (5000 UT) CAPS [id number]  Take 1 capsule (5,000 Units total) by mouth daily. For 8 weeks, then start Vitamin D3 2,000 units daily (OTC) Karamalegos, Netta Neat, DO  Active Spouse/Significant Other  cyanocobalamin (VITAMIN B12) 1000 MCG tablet [id number]  Take 1 tablet (1,000 mcg total) by mouth daily. Smitty Cords, DO  Active Spouse/Significant Other  empagliflozin (JARDIANCE) 25 MG TABS tablet [id number] Yes Take 1 tablet by mouth daily. [provider] Taking Active Spouse/Significant Other  escitalopram (LEXAPRO) 20 MG tablet [id number]  TAKE 1 TABLET BY MOUTH ONCE DAILY AS DIRECTED TAPER AS DIRECTED. OFF ZOLOFT AND ON TO ESCITALOPRAM Karamalegos, Netta Neat, DO   Active   Evolocumab (REPATHA SURECLICK) 140 MG/ML SOAJ [id number]  Inject 140 mg into the skin every 14 (fourteen) days. [provider]  Active   folic acid (FOLVITE) 1 MG tablet [id number]  Take 1 mg by mouth See admin instructions. Take 1 mg daily except skip dose on Wednesdays (methotrexate day) [provider]  Active Self, Spouse/Significant Other  furosemide (LASIX) 40 MG tablet [id number]  TAKE 1 TABLET BY MOUTH ONCE DAILY Karamalegos, Netta Neat, DO  Active Spouse/Significant Other  gabapentin (NEURONTIN) 100 MG capsule [id number]  TAKE 1 CAPSULE BY MOUTH TWICE DAILY Baity, Salvadore [place], NP  Active Spouse/Significant Other  lisinopril (ZESTRIL) 20 MG tablet [id number]  Take 1 tablet by mouth 2 (two) times daily. [provider]  Active Spouse/Significant Other  loratadine (CLARITIN) 10 MG tablet [id number]  Take 10 mg by mouth every morning. [provider]  Active Self, Spouse/Significant Other  metFORMIN (GLUCOPHAGE) 500 MG tablet [id number] Yes TAKE 2 TABLETS BY MOUTH ONCE EVERY MORNING WITH BREAKFAST AND 1 TABLET ONCEEVERY  EVENING WITH DINNER Karamalegos, Kayleen [place], DO Taking Active Spouse/Significant Other  methotrexate (RHEUMATREX) 2.5 MG tablet [id number]  Take 10 mg by mouth once a week. On Wednesdays [provider]  Active Spouse/Significant Other  metoprolol tartrate (LOPRESSOR) 25 MG tablet [id number]  TAKE 1 TABLET BY MOUTH TWICE DAILY Karamalegos, Kayleen Party, DO  Active Spouse/Significant Other  nitroGLYCERIN (NITROSTAT) 0.4 MG SL tablet [id number]  Place under the tongue. [provider]  Active Spouse/Significant Other  TRULICITY 3 MG/0.5ML SOPN [id number] Yes INJECT 3 MG (0.5 ML) UNDER THE SKIN ONCE A WEEK Karamalegos, Kayleen Party, DO Taking Active Spouse/Significant Other              Assessment/Plan:   Diabetes: - Counseled on importance of having well-balanced meals, while controlling carbohydrate portion sizes -  Recommend to continue to check glucose and keep a log of the results - Collaborating with provider, CPhT, and patient to pursue assistance for Jardiance from [employer]     Hypertension: - Have reviewed appropriate blood pressure monitoring technique and reviewed goal blood pressure.  - Recommend patient to continue to monitor home BP, keep log of results including HR readings and bring this record to medical appointments      Hyperlipidemia/ASCVD Risk Reduction: - Patient to follow up with [facility] as needed for refills of Repatha Note have provided grant billing information to patient's pharmacy Patient aware to provide to pharmacy and advise pharmacy to bill remaining balance of cost for Repatha prescription to [employer] grant ONLY AFTER billed to Medicare      Follow Up Plan: Clinical Pharmacist will follow up with patient by telephone on 09/15/2023 at 1:00 PM    Arthur Lash, PharmD, Becky Bowels, CPP Clinical Pharmacist [facility] [phone]

## 2023-05-22 DIAGNOSIS — I2510 Atherosclerotic heart disease of native coronary artery without angina pectoris: Secondary | ICD-10-CM | POA: Diagnosis not present

## 2023-05-22 DIAGNOSIS — Z951 Presence of aortocoronary bypass graft: Secondary | ICD-10-CM | POA: Diagnosis not present

## 2023-05-22 DIAGNOSIS — E782 Mixed hyperlipidemia: Secondary | ICD-10-CM | POA: Diagnosis not present

## 2023-05-22 DIAGNOSIS — I5033 Acute on chronic diastolic (congestive) heart failure: Secondary | ICD-10-CM | POA: Diagnosis not present

## 2023-05-22 DIAGNOSIS — I10 Essential (primary) hypertension: Secondary | ICD-10-CM | POA: Diagnosis not present

## 2023-05-22 NOTE — Telephone Encounter (Signed)
 PAP: Patient assistance application for Jardiance has been approved by PAP Companies: [employer] from 05/22/2023 to 02/04/2024. Medication should be delivered to PAP Delivery: Home. For further shipping updates, please contact [employer] ([employer]) at [phone]. Patient ID is: not provided

## 2023-05-22 NOTE — Progress Notes (Signed)
 Pharmacy Medication Assistance Program Note    05/22/2023  Patient ID: Kevin Perry, male  DOB: [birth year], 73 y.o.  MRN:  [id number]     05/22/2023  Outreach Medication Two  Manufacturer Medication Two [employer]  [employer] Drugs Jardiance  Dose of Jardiance 25 mg  Type of Radiographer, therapeutic Assistance  Date Application Sent to Patient 04/30/2023  Application Items Requested Application  Date Application Sent to Prescriber 05/01/2023  Name of Prescriber Domingo Friend  Date Application Received From Patient 05/15/2023  Application Items Received From Patient Application  Date Application Received From Provider 06/02/2023  Method Application Sent to Manufacturer Fax  Date Application Submitted to Manufacturer 05/15/2023  Patient Assistance Determination Approved  Approval Start Date 05/22/2023  Patient Notification Method MyChart

## 2023-05-29 DIAGNOSIS — R3121 Asymptomatic microscopic hematuria: Secondary | ICD-10-CM | POA: Diagnosis not present

## 2023-06-04 ENCOUNTER — Ambulatory Visit (INDEPENDENT_AMBULATORY_CARE_PROVIDER_SITE_OTHER): Admitting: Family Medicine

## 2023-06-04 ENCOUNTER — Encounter: Payer: Self-pay | Admitting: Family Medicine

## 2023-06-04 VITALS — BP 130/68 | HR 54 | Temp 97.6°F | Ht 74.0 in | Wt 258.2 lb

## 2023-06-04 DIAGNOSIS — J0110 Acute frontal sinusitis, unspecified: Secondary | ICD-10-CM

## 2023-06-04 MED ORDER — AMOXICILLIN-POT CLAVULANATE 875-125 MG PO TABS: 1.0000 | ORAL_TABLET | Freq: Two times a day (BID) | ORAL | 0 refills | Status: DC

## 2023-06-04 NOTE — Progress Notes (Signed)
 Subjective:    Patient ID: Kevin Perry, male    DOB: 07-May-1950, 73 y.o.   MRN: [id number]  Kevin Perry is a 73 y.o. male presenting on 06/04/2023 for Sinusitis  Patient presents for a same day appointment.   HPI  Discussed the use of AI scribe software for clinical note transcription with the patient, who gave verbal consent to proceed.  History of Present Illness   Kevin Perry is a 73 year old male who presents with upper respiratory symptoms and sinus congestion.  He has been experiencing upper respiratory symptoms for about a week, starting with congestion primarily in the upper sinus area. The symptoms have since progressed to include the lower chest. He describes the cough as not very productive, with minimal sputum production.  He experiences chills but no fever, nausea, vomiting, or diarrhea. He mentions slight headaches and a sore throat, with discomfort described as 'just kind of all over.' No ear pain is reported.  For symptom management, he has been using Xicam, cough medicine, and Mucinex . He also has a history of ear tubes, which have been placed multiple times over the years. His symptoms are worse in the mornings and evenings, particularly after lying down, but improve with movement.  He took home COVID test yesterday and it was negative.  Denies any GI symptoms      06/04/2023   10:03 AM 04/04/2023    3:20 PM 01/20/2023    9:09 AM  Depression screen PHQ 2/9  Decreased Interest 0 0 0  Down, Depressed, Hopeless 0 0 0  PHQ - 2 Score 0 0 0  Altered sleeping 0 0   Tired, decreased energy 0 0   Change in appetite 0 0   Feeling bad or failure about yourself  0 0   Trouble concentrating 0 0   Moving slowly or fidgety/restless 0 0   Suicidal thoughts 0 0   PHQ-9 Score 0 0   Difficult doing work/chores  Not difficult at all        06/04/2023   10:03 AM 01/20/2023    9:09 AM 12/24/2022    2:14 PM 07/15/2022   10:20 AM  GAD 7 : Generalized Anxiety  Score  Nervous, Anxious, on Edge 0 0 0 0  Control/stop worrying 0 0 0 0  Worry too much - different things 0 0 0 0  Trouble relaxing 0 0 0 0  Restless 0 0 0 0  Easily annoyed or irritable 0 0 0 0  Afraid - awful might happen 0 0 0 0  Total GAD 7 Score 0 0 0 0  Anxiety Difficulty    Not difficult at all    Social History   Tobacco Use   Smoking status: Former    Current packs/day: 0.00    Types: Cigarettes    Quit date: 08/16/2004    Years since quitting: 18.8   Smokeless tobacco: Former    Quit date: 2006   Tobacco comments:    quit 20 plus years  Vaping Use   Vaping status: Never Used  Substance Use Topics   Alcohol use: Not Currently    Alcohol/week: 2.0 standard drinks of alcohol    Types: 2 Cans of beer per week    Comment: SOCIAL   Drug use: No    Review of Systems Per HPI unless specifically indicated above     Objective:    BP 130/68 (BP Location: Right Arm, Patient Position: Sitting, Cuff Size:  Large)   Pulse (!) 54   Temp 97.6 F (36.4 C) (Oral)   Ht 6\' 2"  (1.88 m)   Wt 258 lb 4 oz (117.1 kg)   SpO2 99%   BMI 33.16 kg/m   Wt Readings from Last 3 Encounters:  06/04/23 258 lb 4 oz (117.1 kg)  03/13/23 262 lb 5.6 oz (119 kg)  03/04/23 264 lb (119.7 kg)    Physical Exam Vitals and nursing note reviewed.  Constitutional:      General: He is not in acute distress.    Appearance: Normal appearance. He is well-developed. He is not diaphoretic.     Comments: Well-appearing, comfortable, cooperative  HENT:     Head: Normocephalic and atraumatic.     Right Ear: Tympanic membrane, ear canal and external ear normal. There is no impacted cerumen.     Left Ear: Tympanic membrane, ear canal and external ear normal. There is no impacted cerumen.     Ears:     Comments: R TM has blue tymp tube in it    Nose: Congestion present.     Mouth/Throat:     Mouth: Mucous membranes are moist.     Pharynx: Posterior oropharyngeal erythema (mild generalized, post nasal  area non specific) present. No oropharyngeal exudate.  Eyes:     General:        Right eye: No discharge.        Left eye: No discharge.     Conjunctiva/sclera: Conjunctivae normal.  Cardiovascular:     Rate and Rhythm: Normal rate.  Pulmonary:     Effort: Pulmonary effort is normal. No respiratory distress.     Breath sounds: Normal breath sounds. No stridor. No wheezing, rhonchi or rales.     Comments: cough Skin:    General: Skin is warm and dry.     Findings: No erythema or rash.  Neurological:     Mental Status: He is alert and oriented to person, place, and time.  Psychiatric:        Mood and Affect: Mood normal.        Behavior: Behavior normal.        Thought Content: Thought content normal.     Comments: Well groomed, good eye contact, normal speech and thoughts     Results for orders placed or performed during the hospital encounter of 03/13/23  CBC with Differential   Collection Time: 03/13/23  2:40 PM  Result Value Ref Range   WBC 6.8 4.0 - 10.5 K/uL   RBC 4.73 4.22 - 5.81 MIL/uL   Hemoglobin 15.7 13.0 - 17.0 g/dL   HCT 78.2 95.6 - 21.3 %   MCV 95.1 80.0 - 100.0 fL   MCH 33.2 26.0 - 34.0 pg   MCHC 34.9 30.0 - 36.0 g/dL   RDW 08.6 57.8 - 46.9 %   Platelets 199 150 - 400 K/uL   nRBC 0.0 0.0 - 0.2 %   Neutrophils Relative % 62 %   Neutro Abs 4.2 1.7 - 7.7 K/uL   Lymphocytes Relative 24 %   Lymphs Abs 1.6 0.7 - 4.0 K/uL   Monocytes Relative 9 %   Monocytes Absolute 0.6 0.1 - 1.0 K/uL   Eosinophils Relative 4 %   Eosinophils Absolute 0.3 0.0 - 0.5 K/uL   Basophils Relative 1 %   Basophils Absolute 0.1 0.0 - 0.1 K/uL   Immature Granulocytes 0 %   Abs Immature Granulocytes 0.01 0.00 - 0.07 K/uL  Basic metabolic panel  Collection Time: 03/13/23  2:40 PM  Result Value Ref Range   Sodium 139 135 - 145 mmol/L   Potassium 4.1 3.5 - 5.1 mmol/L   Chloride 100 98 - 111 mmol/L   CO2 27 22 - 32 mmol/L   Glucose, Bld 203 (H) 70 - 99 mg/dL   BUN 15 8 - 23 mg/dL    Creatinine, Ser 1.61 0.61 - 1.24 mg/dL   Calcium  9.3 8.9 - 10.3 mg/dL   GFR, Estimated >09 >60 mL/min   Anion gap 12 5 - 15  CBG monitoring, ED   Collection Time: 03/13/23  9:06 PM  Result Value Ref Range   Glucose-Capillary 137 (H) 70 - 99 mg/dL  Basic metabolic panel   Collection Time: 03/14/23  5:57 AM  Result Value Ref Range   Sodium 139 135 - 145 mmol/L   Potassium 3.4 (L) 3.5 - 5.1 mmol/L   Chloride 106 98 - 111 mmol/L   CO2 24 22 - 32 mmol/L   Glucose, Bld 130 (H) 70 - 99 mg/dL   BUN 13 8 - 23 mg/dL   Creatinine, Ser 4.54 0.61 - 1.24 mg/dL   Calcium  8.6 (L) 8.9 - 10.3 mg/dL   GFR, Estimated >09 >81 mL/min   Anion gap 9 5 - 15  CBC   Collection Time: 03/14/23  5:57 AM  Result Value Ref Range   WBC 5.1 4.0 - 10.5 K/uL   RBC 4.35 4.22 - 5.81 MIL/uL   Hemoglobin 14.6 13.0 - 17.0 g/dL   HCT 19.1 47.8 - 29.5 %   MCV 97.5 80.0 - 100.0 fL   MCH 33.6 26.0 - 34.0 pg   MCHC 34.4 30.0 - 36.0 g/dL   RDW 62.1 30.8 - 65.7 %   Platelets 171 150 - 400 K/uL   nRBC 0.0 0.0 - 0.2 %  CBG monitoring, ED   Collection Time: 03/14/23  7:24 AM  Result Value Ref Range   Glucose-Capillary 138 (H) 70 - 99 mg/dL      Assessment & Plan:   Problem List Items Addressed This Visit   None Visit Diagnoses       Acute non-recurrent frontal sinusitis    -  Primary   Relevant Medications   amoxicillin -clavulanate (AUGMENTIN ) 875-125 MG tablet        Acute sinusitis Acute sinusitis with symptoms persisting for a week. Suspected second sickening now with worsening concern for bacterial sinusitis. Lungs clear, infection likely confined to sinuses but lower respiratory symptoms developing  - Prescribed Augmentin  875 mg BID for 10 days. - Continue Xicam, cough medicine, Mucinex , and allergy pills. - Advised against using Mucinex  for more than 7-10 days. - Encouraged hydration. - Advised to monitor symptoms and return for x-ray if symptoms worsen or persist.        No orders of the defined  types were placed in this encounter.   Meds ordered this encounter  Medications   amoxicillin -clavulanate (AUGMENTIN ) 875-125 MG tablet    Sig: Take 1 tablet by mouth 2 (two) times daily.    Dispense:  20 tablet    Refill:  0    Follow up plan: Return if symptoms worsen or fail to improve.   Domingo Friend, DO [facility] 06/04/2023, 10:16 AM

## 2023-06-04 NOTE — Patient Instructions (Addendum)
 Thank you for coming to the office today.  1. It sounds like you have a Sinusitis (Bacterial Infection) - this most likely started as an Upper Respiratory Virus that has settled into an infection. Allergies can also cause this.  - Start Augmentin  1 pill twice daily (breakfast and dinner, with food and plenty of water) for 10 days, complete entire course, do not stop early even if feeling better  Keep on allergy treatment, mucinex , xicam, cough medicine  - Recommend using Nasal Saline spray multiple times a day to help flush out congestion and clear sinuses - Improve hydration by drinking plenty of clear fluids (water, gatorade) to reduce secretions and thin congestion - Congestion draining down throat can cause irritation. May try warm herbal tea with honey, cough drops - Can take Tylenol  or Ibuprofen as needed for fevers - May continue over the counter cold medicine as you are, I would not use any decongestant or mucinex  longer than 7 days.   Please schedule a Follow-up Appointment to: Return if symptoms worsen or fail to improve.  If you have any other questions or concerns, please feel free to call the office or send a message through MyChart. You may also schedule an earlier appointment if necessary.  Additionally, you may be receiving a survey about your experience at our office within a few days to 1 week by e-mail or mail. We value your feedback.  Domingo Friend, DO [facility], [place]

## 2023-06-05 DIAGNOSIS — S90222A Contusion of left lesser toe(s) with damage to nail, initial encounter: Secondary | ICD-10-CM | POA: Diagnosis not present

## 2023-06-05 DIAGNOSIS — M86472 Chronic osteomyelitis with draining sinus, left ankle and foot: Secondary | ICD-10-CM | POA: Diagnosis not present

## 2023-06-05 DIAGNOSIS — L97522 Non-pressure chronic ulcer of other part of left foot with fat layer exposed: Secondary | ICD-10-CM | POA: Diagnosis not present

## 2023-06-07 ENCOUNTER — Other Ambulatory Visit: Payer: Self-pay | Admitting: Internal Medicine

## 2023-06-07 ENCOUNTER — Other Ambulatory Visit: Payer: Self-pay | Admitting: Family Medicine

## 2023-06-07 DIAGNOSIS — I10 Essential (primary) hypertension: Secondary | ICD-10-CM

## 2023-06-07 DIAGNOSIS — M170 Bilateral primary osteoarthritis of knee: Secondary | ICD-10-CM

## 2023-06-07 DIAGNOSIS — E1169 Type 2 diabetes mellitus with other specified complication: Secondary | ICD-10-CM

## 2023-06-10 NOTE — Telephone Encounter (Signed)
 Requested Prescriptions  Pending Prescriptions Disp Refills   gabapentin  (NEURONTIN ) 100 MG capsule [Pharmacy Med Name: GABAPENTIN  100 MG CAP] 180 capsule 0    Sig: TAKE 1 CAPSULE BY MOUTH TWICE DAILY     Neurology: Anticonvulsants - gabapentin  Passed - 06/10/2023 10:55 AM      Passed - Cr in normal range and within 360 days    Creat  Date Value Ref Range Status  01/17/2023 0.93 0.70 - 1.28 mg/dL Final   Creatinine, Ser  Date Value Ref Range Status  03/14/2023 0.81 0.61 - 1.24 mg/dL Final   Creatinine, POC  Date Value Ref Range Status  03/09/2015 0 mg/dL Final   Creatinine, Urine  Date Value Ref Range Status  01/17/2023 61 20 - 320 mg/dL Final         Passed - Completed PHQ-2 or PHQ-9 in the last 360 days      Passed - Valid encounter within last 12 months    Recent Outpatient Visits           6 days ago Acute non-recurrent frontal sinusitis   [place] [facility] Raina Bunting, DO       Future Appointments             In 1 month [place], Kayleen Party, DO  [facility], [facility]

## 2023-06-10 NOTE — Telephone Encounter (Signed)
 Requested Prescriptions  Pending Prescriptions Disp Refills   metFORMIN  (GLUCOPHAGE ) 500 MG tablet [Pharmacy Med Name: METFORMIN  HCL 500 MG TAB] 270 tablet 0    Sig: TAKE 2 TABLETS BY MOUTH ONCE EVERY MORNING WITH BREAKFAST AND 1 TABLET ONCEEVERY EVENING WITH DINNER     Endocrinology:  Diabetes - Biguanides Passed - 06/10/2023 11:44 AM      Passed - Cr in normal range and within 360 days    Creat  Date Value Ref Range Status  01/17/2023 0.93 0.70 - 1.28 mg/dL Final   Creatinine, Ser  Date Value Ref Range Status  03/14/2023 0.81 0.61 - 1.24 mg/dL Final   Creatinine, POC  Date Value Ref Range Status  03/09/2015 0 mg/dL Final   Creatinine, Urine  Date Value Ref Range Status  01/17/2023 61 20 - 320 mg/dL Final         Passed - HBA1C is between 0 and 7.9 and within 180 days    Hemoglobin A1C  Date Value Ref Range Status  07/27/2012 7.6 (H) 4.2 - 6.3 % Final    Comment:    The American Diabetes Association recommends that a primary goal of therapy should be <7% and that physicians should reevaluate the treatment regimen in patients with HbA1c values consistently >8%.    Hgb A1c MFr Bld  Date Value Ref Range Status  01/17/2023 7.9 (H) <5.7 % of total Hgb Final    Comment:    For someone without known diabetes, a hemoglobin A1c value of 6.5% or greater indicates that they may have  diabetes and this should be confirmed with a follow-up  test. . For someone with known diabetes, a value <7% indicates  that their diabetes is well controlled and a value  greater than or equal to 7% indicates suboptimal  control. A1c targets should be individualized based on  duration of diabetes, age, comorbid conditions, and  other considerations. . Currently, no consensus exists regarding use of hemoglobin A1c for diagnosis of diabetes for children. .          Passed - eGFR in normal range and within 360 days    GFR, Est African American  Date Value Ref Range Status  03/02/2020 92 > OR  = 60 mL/min/1.28m2 Final   GFR, Est Non African American  Date Value Ref Range Status  03/02/2020 79 > OR = 60 mL/min/1.65m2 Final   GFR, Estimated  Date Value Ref Range Status  03/14/2023 >60 >60 mL/min Final    Comment:    (NOTE) Calculated using the CKD-EPI Creatinine Equation (2021)    eGFR  Date Value Ref Range Status  01/17/2023 87 > OR = 60 mL/min/1.8m2 Final         Passed - B12 Level in normal range and within 720 days    Vitamin B-12  Date Value Ref Range Status  01/17/2023 284 200 - 1,100 pg/mL Final    Comment:    . Please Note: Although the reference range for vitamin B12 is [phone] pg/mL, it has been reported that between 5 and 10% of patients with values between 200 and 400 pg/mL may experience neuropsychiatric and hematologic abnormalities due to occult B12 deficiency; less than 1% of patients with values above 400 pg/mL will have symptoms. Temple Feeler - Valid encounter within last 6 months    Recent Outpatient Visits           6 days ago Acute non-recurrent frontal  sinusitis   [facility] [place], Kayleen Party, DO       Future Appointments             In 1 month [place], Kayleen Party, DO [facility], [facility]            Passed - CBC within normal limits and completed in the last 12 months    WBC  Date Value Ref Range Status  03/14/2023 5.1 4.0 - 10.5 K/uL Final   RBC  Date Value Ref Range Status  03/14/2023 4.35 4.22 - 5.81 MIL/uL Final   Hemoglobin  Date Value Ref Range Status  03/14/2023 14.6 13.0 - 17.0 g/dL Final   HGB  Date Value Ref Range Status  06/20/2011 14.9 13.0 - 18.0 g/dL Final   HCT  Date Value Ref Range Status  03/14/2023 42.4 39.0 - 52.0 % Final  06/20/2011 42.8 40.0 - 52.0 % Final   MCHC  Date Value Ref Range Status  03/14/2023 34.4 30.0 - 36.0 g/dL Final   [facility]  Date Value Ref Range Status  03/14/2023 33.6 26.0 - 34.0 pg Final   MCV   Date Value Ref Range Status  03/14/2023 97.5 80.0 - 100.0 fL Final  06/20/2011 93 80 - 100 fL Final   No results found for: "PLTCOUNTKUC", "LABPLAT", "POCPLA" RDW  Date Value Ref Range Status  03/14/2023 14.0 11.5 - 15.5 % Final  06/20/2011 13.2 11.5 - 14.5 % Final          metoprolol  tartrate (LOPRESSOR ) 25 MG tablet [Pharmacy Med Name: METOPROLOL  TARTRATE 25 MG TAB] 180 tablet 0    Sig: TAKE 1 TABLET BY MOUTH TWICE DAILY     Cardiovascular:  Beta Blockers Passed - 06/10/2023 11:44 AM      Passed - Last BP in normal range    BP Readings from Last 1 Encounters:  06/04/23 130/68         Passed - Last Heart Rate in normal range    Pulse Readings from Last 1 Encounters:  06/04/23 (!) 54         Passed - Valid encounter within last 6 months    Recent Outpatient Visits           6 days ago Acute non-recurrent frontal sinusitis   [place] [facility] [place], Kayleen Party, DO       Future Appointments             In 1 month Romeo Co, Kayleen Party, DO [facility], [facility]

## 2023-06-16 ENCOUNTER — Other Ambulatory Visit: Payer: Self-pay | Admitting: Urology

## 2023-06-16 DIAGNOSIS — R3129 Other microscopic hematuria: Secondary | ICD-10-CM

## 2023-06-19 ENCOUNTER — Ambulatory Visit
Admission: RE | Admit: 2023-06-19 | Discharge: 2023-06-19 | Disposition: A | Discharge status: Home / Self Care | Source: Ambulatory Visit | Attending: Urology | Admitting: Urology

## 2023-06-19 DIAGNOSIS — R3121 Asymptomatic microscopic hematuria: Secondary | ICD-10-CM | POA: Diagnosis not present

## 2023-06-19 DIAGNOSIS — R3129 Other microscopic hematuria: Secondary | ICD-10-CM

## 2023-06-19 DIAGNOSIS — N200 Calculus of kidney: Secondary | ICD-10-CM | POA: Diagnosis not present

## 2023-06-19 MED ORDER — IOPAMIDOL (ISOVUE-300) INJECTION 61%
100.0000 mL | Freq: Once | INTRAVENOUS | Status: AC | PRN
  Administered 2023-06-19: 100 mL via INTRAVENOUS

## 2023-06-23 DIAGNOSIS — R3121 Asymptomatic microscopic hematuria: Secondary | ICD-10-CM | POA: Diagnosis not present

## 2023-06-23 DIAGNOSIS — N529 Male erectile dysfunction, unspecified: Secondary | ICD-10-CM | POA: Diagnosis not present

## 2023-06-23 DIAGNOSIS — N200 Calculus of kidney: Secondary | ICD-10-CM | POA: Diagnosis not present

## 2023-06-24 ENCOUNTER — Other Ambulatory Visit: Payer: Self-pay | Admitting: Family Medicine

## 2023-06-24 DIAGNOSIS — I10 Essential (primary) hypertension: Secondary | ICD-10-CM

## 2023-06-25 NOTE — Telephone Encounter (Signed)
 Requested Prescriptions  Pending Prescriptions Disp Refills   amLODipine  (NORVASC ) 10 MG tablet [Pharmacy Med Name: AMLODIPINE  BESYLATE 10 MG TAB] 90 tablet 0    Sig: TAKE 1 TABLET BY MOUTH ONCE DAILY     Cardiovascular: Calcium  Channel Blockers 2 Passed - 06/25/2023  3:53 PM      Passed - Last BP in normal range    BP Readings from Last 1 Encounters:  06/04/23 130/68         Passed - Last Heart Rate in normal range    Pulse Readings from Last 1 Encounters:  06/04/23 (!) 54         Passed - Valid encounter within last 6 months    Recent Outpatient Visits           3 weeks ago Acute non-recurrent frontal sinusitis   [employer] [facility] [place], Kayleen Party, DO       Future Appointments             In 1 month Rome[place] Co, Kayleen Party, DO [place] [facility], [facility]

## 2023-06-28 ENCOUNTER — Emergency Department

## 2023-06-28 ENCOUNTER — Inpatient Hospital Stay
Admission: EM | Admit: 2023-06-28 | Discharge: 2023-06-30 | DRG: 871 | Discharge status: Against Medical Advice | Attending: Hospitalist | Admitting: Hospitalist

## 2023-06-28 ENCOUNTER — Encounter: Payer: Self-pay | Admitting: Pharmacy Technician

## 2023-06-28 ENCOUNTER — Ambulatory Visit
Admission: EM | Admit: 2023-06-28 | Discharge: 2023-06-28 | Disposition: A | Discharge status: Home / Self Care | Attending: Physician Assistant | Admitting: Physician Assistant

## 2023-06-28 ENCOUNTER — Other Ambulatory Visit: Payer: Self-pay

## 2023-06-28 DIAGNOSIS — J9601 Acute respiratory failure with hypoxia: Secondary | ICD-10-CM | POA: Diagnosis present

## 2023-06-28 DIAGNOSIS — R6520 Severe sepsis without septic shock: Secondary | ICD-10-CM

## 2023-06-28 DIAGNOSIS — J189 Pneumonia, unspecified organism: Secondary | ICD-10-CM | POA: Diagnosis not present

## 2023-06-28 DIAGNOSIS — E1165 Type 2 diabetes mellitus with hyperglycemia: Secondary | ICD-10-CM | POA: Diagnosis present

## 2023-06-28 DIAGNOSIS — R112 Nausea with vomiting, unspecified: Secondary | ICD-10-CM

## 2023-06-28 DIAGNOSIS — Z886 Allergy status to analgesic agent status: Secondary | ICD-10-CM | POA: Diagnosis not present

## 2023-06-28 DIAGNOSIS — I480 Paroxysmal atrial fibrillation: Secondary | ICD-10-CM | POA: Diagnosis not present

## 2023-06-28 DIAGNOSIS — I2510 Atherosclerotic heart disease of native coronary artery without angina pectoris: Secondary | ICD-10-CM | POA: Diagnosis present

## 2023-06-28 DIAGNOSIS — R918 Other nonspecific abnormal finding of lung field: Secondary | ICD-10-CM | POA: Diagnosis not present

## 2023-06-28 DIAGNOSIS — R0602 Shortness of breath: Secondary | ICD-10-CM | POA: Diagnosis not present

## 2023-06-28 DIAGNOSIS — Z8249 Family history of ischemic heart disease and other diseases of the circulatory system: Secondary | ICD-10-CM

## 2023-06-28 DIAGNOSIS — R051 Acute cough: Secondary | ICD-10-CM

## 2023-06-28 DIAGNOSIS — A419 Sepsis, unspecified organism: Principal | ICD-10-CM | POA: Diagnosis present

## 2023-06-28 DIAGNOSIS — E1169 Type 2 diabetes mellitus with other specified complication: Secondary | ICD-10-CM | POA: Diagnosis not present

## 2023-06-28 DIAGNOSIS — R0689 Other abnormalities of breathing: Secondary | ICD-10-CM | POA: Diagnosis not present

## 2023-06-28 DIAGNOSIS — J159 Unspecified bacterial pneumonia: Secondary | ICD-10-CM | POA: Diagnosis not present

## 2023-06-28 DIAGNOSIS — N200 Calculus of kidney: Secondary | ICD-10-CM | POA: Diagnosis not present

## 2023-06-28 DIAGNOSIS — Z1152 Encounter for screening for COVID-19: Secondary | ICD-10-CM | POA: Diagnosis not present

## 2023-06-28 DIAGNOSIS — R509 Fever, unspecified: Secondary | ICD-10-CM | POA: Diagnosis not present

## 2023-06-28 DIAGNOSIS — Z79899 Other long term (current) drug therapy: Secondary | ICD-10-CM

## 2023-06-28 DIAGNOSIS — Z7982 Long term (current) use of aspirin: Secondary | ICD-10-CM | POA: Diagnosis not present

## 2023-06-28 DIAGNOSIS — Z87891 Personal history of nicotine dependence: Secondary | ICD-10-CM

## 2023-06-28 DIAGNOSIS — Z7984 Long term (current) use of oral hypoglycemic drugs: Secondary | ICD-10-CM | POA: Diagnosis not present

## 2023-06-28 DIAGNOSIS — Z6833 Body mass index (BMI) 33.0-33.9, adult: Secondary | ICD-10-CM | POA: Diagnosis not present

## 2023-06-28 DIAGNOSIS — I517 Cardiomegaly: Secondary | ICD-10-CM | POA: Diagnosis not present

## 2023-06-28 DIAGNOSIS — R0603 Acute respiratory distress: Secondary | ICD-10-CM

## 2023-06-28 DIAGNOSIS — R590 Localized enlarged lymph nodes: Secondary | ICD-10-CM | POA: Diagnosis not present

## 2023-06-28 DIAGNOSIS — E872 Acidosis, unspecified: Secondary | ICD-10-CM | POA: Diagnosis present

## 2023-06-28 DIAGNOSIS — J439 Emphysema, unspecified: Secondary | ICD-10-CM | POA: Diagnosis not present

## 2023-06-28 DIAGNOSIS — N400 Enlarged prostate without lower urinary tract symptoms: Secondary | ICD-10-CM | POA: Diagnosis present

## 2023-06-28 DIAGNOSIS — Z888 Allergy status to other drugs, medicaments and biological substances status: Secondary | ICD-10-CM

## 2023-06-28 DIAGNOSIS — M549 Dorsalgia, unspecified: Secondary | ICD-10-CM | POA: Diagnosis not present

## 2023-06-28 DIAGNOSIS — E669 Obesity, unspecified: Secondary | ICD-10-CM | POA: Diagnosis present

## 2023-06-28 DIAGNOSIS — Z7985 Long-term (current) use of injectable non-insulin antidiabetic drugs: Secondary | ICD-10-CM | POA: Diagnosis not present

## 2023-06-28 DIAGNOSIS — Z96651 Presence of right artificial knee joint: Secondary | ICD-10-CM | POA: Diagnosis not present

## 2023-06-28 DIAGNOSIS — Z951 Presence of aortocoronary bypass graft: Secondary | ICD-10-CM | POA: Diagnosis not present

## 2023-06-28 DIAGNOSIS — Z860100 Personal history of colon polyps, unspecified: Secondary | ICD-10-CM

## 2023-06-28 DIAGNOSIS — I5032 Chronic diastolic (congestive) heart failure: Secondary | ICD-10-CM | POA: Diagnosis not present

## 2023-06-28 DIAGNOSIS — I110 Hypertensive heart disease with heart failure: Secondary | ICD-10-CM | POA: Diagnosis not present

## 2023-06-28 DIAGNOSIS — Z833 Family history of diabetes mellitus: Secondary | ICD-10-CM | POA: Diagnosis not present

## 2023-06-28 DIAGNOSIS — I252 Old myocardial infarction: Secondary | ICD-10-CM

## 2023-06-28 DIAGNOSIS — I10 Essential (primary) hypertension: Secondary | ICD-10-CM | POA: Diagnosis not present

## 2023-06-28 DIAGNOSIS — R935 Abnormal findings on diagnostic imaging of other abdominal regions, including retroperitoneum: Secondary | ICD-10-CM | POA: Diagnosis not present

## 2023-06-28 DIAGNOSIS — Z955 Presence of coronary angioplasty implant and graft: Secondary | ICD-10-CM

## 2023-06-28 LAB — BASIC METABOLIC PANEL WITH GFR
Anion gap: 12 (ref 5–15)
BUN: 18 mg/dL (ref 8–23)
CO2: 23 mmol/L (ref 22–32)
Calcium: 8.4 mg/dL — ABNORMAL LOW (ref 8.9–10.3)
Chloride: 101 mmol/L (ref 98–111)
Creatinine, Ser: 0.93 mg/dL (ref 0.61–1.24)
GFR, Estimated: >60 mL/min (ref >60)
Glucose, Bld: 183 mg/dL — ABNORMAL HIGH (ref 70–99)
Potassium: 3.7 mmol/L (ref 3.5–5.1)
Sodium: 136 mmol/L (ref 135–145)

## 2023-06-28 LAB — COMPREHENSIVE METABOLIC PANEL WITH GFR
ALT: 19 U/L (ref 0–44)
AST: 17 U/L (ref 15–41)
Albumin: 3.8 g/dL (ref 3.5–5.0)
Alkaline Phosphatase: 36 U/L — ABNORMAL LOW (ref 38–126)
Anion gap: 17 — ABNORMAL HIGH (ref 5–15)
BUN: 16 mg/dL (ref 8–23)
CO2: 18 mmol/L — ABNORMAL LOW (ref 22–32)
Calcium: 8.4 mg/dL — ABNORMAL LOW (ref 8.9–10.3)
Chloride: 101 mmol/L (ref 98–111)
Creatinine, Ser: 0.94 mg/dL (ref 0.61–1.24)
GFR, Estimated: >60 mL/min (ref >60)
Glucose, Bld: 192 mg/dL — ABNORMAL HIGH (ref 70–99)
Potassium: 3.5 mmol/L (ref 3.5–5.1)
Sodium: 136 mmol/L (ref 135–145)
Total Bilirubin: 2.8 mg/dL — ABNORMAL HIGH (ref 0.0–1.2)
Total Protein: 6.8 g/dL (ref 6.5–8.1)

## 2023-06-28 LAB — URINALYSIS, W/ REFLEX TO CULTURE (INFECTION SUSPECTED)
Bacteria, UA: NONE SEEN
Bilirubin Urine: NEGATIVE
Glucose, UA: >=500 mg/dL — AB
Ketones, ur: 80 mg/dL — AB
Leukocytes,Ua: NEGATIVE
Nitrite: NEGATIVE
Protein, ur: 30 mg/dL — AB
Specific Gravity, Urine: 1.03 (ref 1.005–1.030)
pH: 5 (ref 5.0–8.0)

## 2023-06-28 LAB — BLOOD GAS, VENOUS
Acid-Base Excess: 2.6 mmol/L — ABNORMAL HIGH (ref 0.0–2.0)
Bicarbonate: 27.2 mmol/L (ref 20.0–28.0)
O2 Saturation: 67.7 %
Patient temperature: 37
pCO2, Ven: 41 mmHg — ABNORMAL LOW (ref 44–60)
pH, Ven: 7.43 (ref 7.25–7.43)
pO2, Ven: 37 mmHg (ref 32–45)

## 2023-06-28 LAB — CBC WITH DIFFERENTIAL/PLATELET
Abs Immature Granulocytes: 0.05 10*3/uL (ref 0.00–0.07)
Basophils Absolute: 0 10*3/uL (ref 0.0–0.1)
Basophils Relative: 0 %
Eosinophils Absolute: 0 10*3/uL (ref 0.0–0.5)
Eosinophils Relative: 0 %
HCT: 46.4 % (ref 39.0–52.0)
Hemoglobin: 16.3 g/dL (ref 13.0–17.0)
Immature Granulocytes: 1 %
Lymphocytes Relative: 8 %
Lymphs Abs: 0.8 10*3/uL (ref 0.7–4.0)
MCH: 34.2 pg — ABNORMAL HIGH (ref 26.0–34.0)
MCHC: 35.1 g/dL (ref 30.0–36.0)
MCV: 97.3 fL (ref 80.0–100.0)
Monocytes Absolute: 0.7 10*3/uL (ref 0.1–1.0)
Monocytes Relative: 7 %
Neutro Abs: 7.8 10*3/uL — ABNORMAL HIGH (ref 1.7–7.7)
Neutrophils Relative %: 84 %
Platelets: 160 10*3/uL (ref 150–400)
RBC: 4.77 MIL/uL (ref 4.22–5.81)
RDW: 14.3 % (ref 11.5–15.5)
WBC: 9.3 10*3/uL (ref 4.0–10.5)
nRBC: 0 % (ref 0.0–0.2)

## 2023-06-28 LAB — LACTIC ACID, PLASMA
Lactic Acid, Venous: 1.8 mmol/L (ref 0.5–1.9)
Lactic Acid, Venous: 1.1 mmol/L (ref 0.5–1.9)

## 2023-06-28 LAB — TROPONIN I (HIGH SENSITIVITY)
Troponin I (High Sensitivity): 9 ng/L (ref <18)
Troponin I (High Sensitivity): 14 ng/L (ref <18)

## 2023-06-28 LAB — RESP PANEL BY RT-PCR (RSV, FLU A&B, COVID)  RVPGX2
Influenza A by PCR: NEGATIVE
Influenza B by PCR: NEGATIVE
Resp Syncytial Virus by PCR: NEGATIVE
SARS Coronavirus 2 by RT PCR: NEGATIVE

## 2023-06-28 LAB — GLUCOSE, CAPILLARY
Glucose-Capillary: 200 mg/dL — ABNORMAL HIGH (ref 70–99)
Glucose-Capillary: 204 mg/dL — ABNORMAL HIGH (ref 70–99)

## 2023-06-28 LAB — HEMOGLOBIN A1C
Hgb A1c MFr Bld: 6.5 % — ABNORMAL HIGH (ref 4.8–5.6)
Mean Plasma Glucose: 139.85 mg/dL

## 2023-06-28 LAB — PROTIME-INR
INR: 1.2 (ref 0.8–1.2)
Prothrombin Time: 15.1 s (ref 11.4–15.2)

## 2023-06-28 LAB — BRAIN NATRIURETIC PEPTIDE: B Natriuretic Peptide: 91.6 pg/mL (ref 0.0–100.0)

## 2023-06-28 LAB — STREP PNEUMONIAE URINARY ANTIGEN: Strep Pneumo Urinary Antigen: NEGATIVE

## 2023-06-28 MED ORDER — INSULIN ASPART 100 UNIT/ML IJ SOLN
0.0000 [IU] | Freq: Three times a day (TID) | INTRAMUSCULAR | Status: DC
  Administered 2023-06-28: 5 [IU] via SUBCUTANEOUS
  Administered 2023-06-29: 3 [IU] via SUBCUTANEOUS
  Administered 2023-06-29: 5 [IU] via SUBCUTANEOUS
  Filled 2023-06-28 (×2): qty 1

## 2023-06-28 MED ORDER — ONDANSETRON HCL 4 MG/2ML IJ SOLN: 8.0000 mg | Freq: Once | INTRAMUSCULAR | Status: DC

## 2023-06-28 MED ORDER — FOLIC ACID 1 MG PO TABS
1.0000 mg | ORAL_TABLET | ORAL | Status: DC
Start: 2023-06-29 — End: 2023-06-30
  Administered 2023-06-29 – 2023-06-30 (×2): 1 mg via ORAL
  Filled 2023-06-28 (×2): qty 1

## 2023-06-28 MED ORDER — ESCITALOPRAM OXALATE 10 MG PO TABS
20.0000 mg | ORAL_TABLET | Freq: Every day | ORAL | Status: DC
  Administered 2023-06-28 – 2023-06-29 (×2): 20 mg via ORAL
  Filled 2023-06-28 (×2): qty 2

## 2023-06-28 MED ORDER — LISINOPRIL 20 MG PO TABS
20.0000 mg | ORAL_TABLET | Freq: Two times a day (BID) | ORAL | Status: DC
  Administered 2023-06-28 – 2023-06-30 (×4): 20 mg via ORAL
  Filled 2023-06-28 (×4): qty 1

## 2023-06-28 MED ORDER — SODIUM CHLORIDE 0.9% FLUSH
3.0000 mL | Freq: Two times a day (BID) | INTRAVENOUS | Status: DC
  Administered 2023-06-28 – 2023-06-30 (×5): 3 mL via INTRAVENOUS

## 2023-06-28 MED ORDER — SODIUM CHLORIDE 0.9 % IV SOLN
500.0000 mg | INTRAVENOUS | Status: DC
  Administered 2023-06-28 – 2023-06-29 (×2): 500 mg via INTRAVENOUS
  Filled 2023-06-28 (×2): qty 5

## 2023-06-28 MED ORDER — SODIUM CHLORIDE 0.9 % IV BOLUS
1000.0000 mL | Freq: Once | INTRAVENOUS | Status: AC
  Administered 2023-06-28: 1000 mL via INTRAVENOUS

## 2023-06-28 MED ORDER — SODIUM CHLORIDE 0.9 % IV SOLN
2.0000 g | Freq: Once | INTRAVENOUS | Status: AC
  Administered 2023-06-28: 2 g via INTRAVENOUS
  Filled 2023-06-28: qty 12.5

## 2023-06-28 MED ORDER — VANCOMYCIN HCL IN DEXTROSE 1-5 GM/200ML-% IV SOLN
1000.0000 mg | Freq: Once | INTRAVENOUS | Status: AC
  Administered 2023-06-28: 1000 mg via INTRAVENOUS
  Filled 2023-06-28: qty 200

## 2023-06-28 MED ORDER — SODIUM CHLORIDE 0.9 % IV SOLN
2.0000 g | INTRAVENOUS | Status: DC
  Administered 2023-06-29 – 2023-06-30 (×2): 2 g via INTRAVENOUS
  Filled 2023-06-28 (×2): qty 20

## 2023-06-28 MED ORDER — LACTATED RINGERS IV BOLUS
1000.0000 mL | Freq: Once | INTRAVENOUS | Status: AC
  Administered 2023-06-28: 1000 mL via INTRAVENOUS

## 2023-06-28 MED ORDER — ACETAMINOPHEN 650 MG RE SUPP: 650.0000 mg | Freq: Four times a day (QID) | RECTAL | Status: DC | PRN

## 2023-06-28 MED ORDER — POLYETHYLENE GLYCOL 3350 17 G PO PACK: 17.0000 g | PACK | Freq: Every day | ORAL | Status: DC | PRN

## 2023-06-28 MED ORDER — GABAPENTIN 100 MG PO CAPS
100.0000 mg | ORAL_CAPSULE | Freq: Two times a day (BID) | ORAL | Status: DC
  Administered 2023-06-28 – 2023-06-30 (×4): 100 mg via ORAL
  Filled 2023-06-28 (×4): qty 1

## 2023-06-28 MED ORDER — LACTATED RINGERS IV SOLN: INTRAVENOUS | Status: AC

## 2023-06-28 MED ORDER — ACETAMINOPHEN 325 MG PO TABS
650.0000 mg | ORAL_TABLET | Freq: Four times a day (QID) | ORAL | Status: DC | PRN
  Administered 2023-06-28: 650 mg via ORAL
  Filled 2023-06-28: qty 2

## 2023-06-28 MED ORDER — METOPROLOL TARTRATE 25 MG PO TABS
25.0000 mg | ORAL_TABLET | Freq: Two times a day (BID) | ORAL | Status: DC
  Administered 2023-06-28 – 2023-06-30 (×4): 25 mg via ORAL
  Filled 2023-06-28 (×4): qty 1

## 2023-06-28 MED ORDER — TRAZODONE HCL 50 MG PO TABS: 25.0000 mg | ORAL_TABLET | Freq: Every evening | ORAL | Status: DC | PRN

## 2023-06-28 MED ORDER — ASPIRIN 81 MG PO TBEC
81.0000 mg | DELAYED_RELEASE_TABLET | Freq: Every day | ORAL | Status: DC
  Administered 2023-06-28 – 2023-06-30 (×3): 81 mg via ORAL
  Filled 2023-06-28 (×3): qty 1

## 2023-06-28 MED ORDER — ONDANSETRON HCL 4 MG PO TABS: 4.0000 mg | ORAL_TABLET | Freq: Four times a day (QID) | ORAL | Status: DC | PRN

## 2023-06-28 MED ORDER — ENOXAPARIN SODIUM 60 MG/0.6ML IJ SOSY
0.5000 mg/kg | PREFILLED_SYRINGE | INTRAMUSCULAR | Status: DC
  Administered 2023-06-28 – 2023-06-29 (×2): 60 mg via SUBCUTANEOUS
  Filled 2023-06-28 (×2): qty 0.6

## 2023-06-28 MED ORDER — BUPROPION HCL ER (XL) 150 MG PO TB24
300.0000 mg | ORAL_TABLET | Freq: Every day | ORAL | Status: DC
  Administered 2023-06-29 – 2023-06-30 (×2): 300 mg via ORAL
  Filled 2023-06-28 (×2): qty 2

## 2023-06-28 MED ORDER — DOXYCYCLINE HYCLATE 100 MG PO TABS
100.0000 mg | ORAL_TABLET | Freq: Once | ORAL | Status: AC
  Administered 2023-06-28: 100 mg via ORAL
  Filled 2023-06-28: qty 1

## 2023-06-28 MED ORDER — LORATADINE 10 MG PO TABS
10.0000 mg | ORAL_TABLET | ORAL | Status: DC
  Administered 2023-06-29 – 2023-06-30 (×2): 10 mg via ORAL
  Filled 2023-06-28 (×2): qty 1

## 2023-06-28 MED ORDER — ONDANSETRON HCL 4 MG/2ML IJ SOLN
4.0000 mg | Freq: Once | INTRAMUSCULAR | Status: AC
  Administered 2023-06-28: 4 mg via INTRAVENOUS
  Filled 2023-06-28: qty 2

## 2023-06-28 MED ORDER — AMLODIPINE BESYLATE 10 MG PO TABS
10.0000 mg | ORAL_TABLET | Freq: Every day | ORAL | Status: DC
  Administered 2023-06-28 – 2023-06-30 (×3): 10 mg via ORAL
  Filled 2023-06-28 (×3): qty 1

## 2023-06-28 MED ORDER — IOHEXOL 350 MG/ML SOLN
75.0000 mL | Freq: Once | INTRAVENOUS | Status: AC | PRN
  Administered 2023-06-28: 75 mL via INTRAVENOUS

## 2023-06-28 MED ORDER — METRONIDAZOLE 500 MG/100ML IV SOLN
500.0000 mg | Freq: Once | INTRAVENOUS | Status: AC
  Administered 2023-06-28: 500 mg via INTRAVENOUS
  Filled 2023-06-28: qty 100

## 2023-06-28 MED ORDER — ONDANSETRON HCL 4 MG/2ML IJ SOLN: 4.0000 mg | Freq: Four times a day (QID) | INTRAMUSCULAR | Status: DC | PRN

## 2023-06-28 NOTE — Progress Notes (Signed)
 CODE SEPSIS - PHARMACY COMMUNICATION  **Broad Spectrum Antibiotics should be administered within 1 hour of Sepsis diagnosis**  Time Code Sepsis Called/Page Received: 0945  Antibiotics Ordered: Vancomycin , Cefepime , Flagyl  Time of 1st antibiotic administration: 0957  Additional action taken by pharmacy: Messaged RN @ [phone] about needing weight    Malone Sear, PharmD, BCPS Clinical Pharmacist 06/28/2023 9:47 AM

## 2023-06-28 NOTE — Progress Notes (Signed)
 PHARMACIST - PHYSICIAN COMMUNICATION  CONCERNING:  Enoxaparin  (Lovenox ) for DVT Prophylaxis    RECOMMENDATION: Patient was prescribed enoxaprin 40mg  q24 hours for VTE prophylaxis.   Filed Weights   06/28/23 0947  Weight: 117.9 kg (260 lb)    Body mass index is 33.38 kg/m.  Estimated Creatinine Clearance: 96.6 mL/min (by C-G formula based on SCr of 0.93 mg/dL).   Based on [facility] policy patient is candidate for enoxaparin  0.5mg /kg TBW SQ every 24 hours based on BMI being >30.  DESCRIPTION: Pharmacy has adjusted enoxaparin  dose per [facility] policy.  Patient is now receiving enoxaparin  60 mg every 24 hours    Ananias Balls, PharmD Clinical Pharmacist  06/28/2023 2:48 PM

## 2023-06-28 NOTE — Assessment & Plan Note (Signed)
 Hypertensive since arrival in the ED.  - Resume home regimen

## 2023-06-28 NOTE — Assessment & Plan Note (Signed)
 Patient reports shortness of breath and dry cough, found to have a right lower lobe pneumonia on CT imaging.  - Continue with azithromycin  and Rocephin - Guaifenesin  - Strep pneumo and Legionella urinary antigens

## 2023-06-28 NOTE — Assessment & Plan Note (Signed)
 In the setting of community-acquired pneumonia.  On minimal supplemental oxygen.  - Continue supplemental oxygen to maintain oxygen saturation above 88% - Wean as tolerated

## 2023-06-28 NOTE — Progress Notes (Signed)
 Approximately 1600--Pt arrived to room 210 from ED. Upon arrival, VS obtained and telemetry connected and notified. Fall precautions in place. Pt oriented to room and unit.

## 2023-06-28 NOTE — Assessment & Plan Note (Signed)
 Has a history of postoperative atrial fibrillation with negative Holter monitor.  He is no longer on anticoagulation.  Sinus rhythm since arrival to the ED

## 2023-06-28 NOTE — Progress Notes (Signed)
 Elink following for sepsis protocol.

## 2023-06-28 NOTE — ED Provider Notes (Signed)
 [facility]    CSN: [id number] Arrival date & time: 06/28/23  0841      History   Chief Complaint Chief Complaint  Patient presents with   Vomiting   Shortness of Breath    HPI Kevin Perry is a 73 y.o. male with history of hypertension, type 2 diabetes, CAD with previous MI, atrial fibrillation, obesity and kidney stones.  Patient presents today for 2-day history of feeling feverish, fatigue, nausea/vomiting and worsening shortness of breath.  Also has mild bilateral flank pain, some dysuria, difficulty urinating, upper abdominal pain, chest pain and cough. Denies diarrhea, constipation, hematuria.   Wife was sick with vomiting a couple of days ago but got better quickly.  Patient has upcoming appointment with surgery for evaluation of " golf ball sized kidney stone."   HPI  Past Medical History:  Diagnosis Date   Anxiety    Arthritis    Coronary artery disease    Diabetes mellitus without complication (HCC)    Hypertension    Hypertensive retinopathy    Myocardial infarction [facility])    Postoperative nausea 11/27/2019    Patient Active Problem List   Diagnosis Date Noted   Sepsis (HCC) 06/28/2023   Diabetic ulcer of toe of left foot associated with type 2 diabetes mellitus, with bone involvement without evidence of necrosis (HCC) 03/14/2023   Foot ulceration, left, with fat layer exposed (HCC) 03/13/2023   Dry skin 03/13/2023   Drug-induced myopathy 01/20/2023   History of complete ray amputation of third toe of right foot (HCC) 12/04/2020   History of complete ray amputation of second toe of right foot (HCC) 12/04/2020   Paroxysmal atrial fibrillation (HCC) 02/10/2020   S/P CABG x 4 12/08/2019   Post-op pain 11/27/2019   Postoperative nausea 11/27/2019   Moderate non-proliferative diabetic retinopathy (HCC) 10/08/2019   History of MI (myocardial infarction) 08/10/2019   Bilateral osteoarthritis of finger 07/17/2018   Obesity (BMI 30.0-34.9)  12/31/2017   Coronary artery disease involving native coronary artery of native heart without angina pectoris 03/09/2015   Essential hypertension 09/29/2014   Type 2 diabetes mellitus with other specified complication (HCC) 09/29/2014   Major depressive disorder, recurrent, moderate (HCC) 09/29/2014   Hyperlipidemia associated with type 2 diabetes mellitus (HCC) 09/29/2014   Edema of both lower extremities due to peripheral venous insufficiency 09/29/2014   Anxiety 09/20/2014   Primary osteoarthritis of knee 08/30/2014    Past Surgical History:  Procedure Laterality Date   BIOPSY  08/27/2022   Procedure: BIOPSY;  Surgeon: Shane Darling, MD;  Location: [facility];  Service: Endoscopy;;   CATARACT EXTRACTION     CHOLECYSTECTOMY     COLONOSCOPY WITH PROPOFOL  N/A 08/27/2022   Procedure: COLONOSCOPY WITH PROPOFOL ;  Surgeon: Shane Darling, MD;  Location: [facility];  Service: Endoscopy;  Laterality: N/A;   CORONARY ANGIOPLASTY WITH STENT PLACEMENT N/A 02/04/2006   ESOPHAGOGASTRODUODENOSCOPY (EGD) WITH PROPOFOL  N/A 08/27/2022   Procedure: ESOPHAGOGASTRODUODENOSCOPY (EGD) WITH PROPOFOL ;  Surgeon: Shane Darling, MD;  Location: [facility];  Service: Endoscopy;  Laterality: N/A;   EYE SURGERY     FOOT SURGERY Left    DOS 8.1.14 HALLIX IPJ FUSION, SECOND MET OSTEOTOMY W/SCREW, HAM TOE REPAIR 2,,4 , PARTIAL AMP 3RD DIGIT    GALLBLADDER SURGERY     HEMOSTASIS CLIP PLACEMENT  08/27/2022   Procedure: HEMOSTASIS CLIP PLACEMENT;  Surgeon: Shane Darling, MD;  Location: [facility];  Service: Endoscopy;;   KNEE SURERY Right    LEFT  HEART CATH AND CORONARY ANGIOGRAPHY Left 11/17/2019   Procedure: LEFT HEART CATH AND CORONARY ANGIOGRAPHY;  Surgeon: Ronney Cola, MD;  Location: [facility];  Service: Cardiovascular;  Laterality: Left;   MYRINGOTOMY WITH TUBE PLACEMENT Right 10/25/2021   Procedure: MYRINGOTOMY WITH BUTTERFLY TUBE PLACEMENT;  Surgeon: Mellody Sprout, MD;  Location: [facility];  Service: ENT;  Laterality: Right;   POLYPECTOMY  08/27/2022   Procedure: POLYPECTOMY;  Surgeon: Shane Darling, MD;  Location: [facility];  Service: Endoscopy;;   SUBMUCOSAL LIFTING INJECTION  08/27/2022   Procedure: SUBMUCOSAL LIFTING INJECTION;  Surgeon: Shane Darling, MD;  Location: [facility];  Service: Endoscopy;;  ELEVIEW ASCENDING COLON POLYP   TOTAL KNEE ARTHROPLASTY Right 08/30/2014   Procedure: TOTAL KNEE ARTHROPLASTY;  Surgeon: Molli Angelucci, MD;  Location: [facility];  Service: Orthopedics;  Laterality: Right;       Home Medications    Prior to Admission medications   Medication Sig Start Date End Date Taking? Authorizing Provider  amLODipine  (NORVASC ) 10 MG tablet TAKE 1 TABLET BY MOUTH ONCE DAILY 06/25/23   Romeo Co, Kayleen Party, DO  amoxicillin -clavulanate (AUGMENTIN ) 875-125 MG tablet Take 1 tablet by mouth 2 (two) times daily. 06/04/23   Karamalegos, Kayleen Party, DO  aspirin  EC 81 MG tablet Take 81 mg by mouth daily. Swallow whole.    [provider]  baclofen  (LIORESAL ) 10 MG tablet Take 0.5-1 tablets (5-10 mg total) by mouth 3 (three) times daily as needed for muscle spasms. 12/24/22   Karamalegos, Kayleen Party, DO  buPROPion  (WELLBUTRIN  XL) 300 MG 24 hr tablet Take 300 mg by mouth daily.    [provider]  Cholecalciferol (VITAMIN D3) 125 MCG (5000 UT) CAPS Take 1 capsule (5,000 Units total) by mouth daily. For 8 weeks, then start Vitamin D3 2,000 units daily (OTC) 01/20/23   Karamalegos, Kayleen Party, DO  cyanocobalamin  (VITAMIN B12) 1000 MCG tablet Take 1 tablet (1,000 mcg total) by mouth daily. 01/20/23   Karamalegos, Kayleen Party, DO  empagliflozin (JARDIANCE) 25 MG TABS tablet Take 1 tablet by mouth daily. 02/03/23 02/03/24  [provider]  escitalopram  (LEXAPRO ) 20 MG tablet TAKE 1 TABLET BY MOUTH ONCE DAILY AS DIRECTED TAPER AS DIRECTED. OFF ZOLOFT  AND ON TO ESCITALOPRAM  05/02/23    Karamalegos, Kayleen Party, DO  Evolocumab  (REPATHA  SURECLICK) 140 MG/ML SOAJ Inject 140 mg into the skin every 14 (fourteen) days.    [provider]  folic acid  (FOLVITE ) 1 MG tablet Take 1 mg by mouth See admin instructions. Take 1 mg daily except skip dose on Wednesdays (methotrexate day) 08/27/16   [provider]  furosemide  (LASIX ) 40 MG tablet TAKE 1 TABLET BY MOUTH ONCE DAILY 08/01/22   Romeo Co, Kayleen Party, DO  gabapentin  (NEURONTIN ) 100 MG capsule TAKE 1 CAPSULE BY MOUTH TWICE DAILY 06/10/23   Karamalegos, Kayleen Party, DO  lisinopril  (ZESTRIL ) 20 MG tablet Take 1 tablet by mouth 2 (two) times daily. 10/29/21   [provider]  loratadine  (CLARITIN ) 10 MG tablet Take 10 mg by mouth every morning.    [provider]  metFORMIN  (GLUCOPHAGE ) 500 MG tablet TAKE 2 TABLETS BY MOUTH ONCE EVERY MORNING WITH BREAKFAST AND 1 TABLET ONCEEVERY EVENING WITH DINNER 06/10/23   Karamalegos, Kayleen Party, DO  methotrexate (RHEUMATREX) 2.5 MG tablet Take 10 mg by mouth once a week. On Wednesdays 06/05/22   [provider]  metoprolol  tartrate (LOPRESSOR ) 25 MG tablet TAKE 1 TABLET BY MOUTH TWICE DAILY 06/10/23  Karamalegos, Alexander J, DO  nitroGLYCERIN  (NITROSTAT ) 0.4 MG SL tablet Place under the tongue. 11/18/19   [provider]  TRULICITY  3 MG/0.5ML SOPN INJECT 3 MG (0.5 ML) UNDER THE SKIN ONCE A WEEK 12/12/21   Karamalegos, Kayleen Party, DO    Family History Family History  Problem Relation Age of Onset   Heart attack Mother    Diabetes Father    Hypertension Father    Heart attack Sister     Social History Social History   Tobacco Use   Smoking status: Former    Current packs/day: 0.00    Types: Cigarettes    Quit date: 08/16/2004    Years since quitting: 18.8   Smokeless tobacco: Former    Quit date: 2006   Tobacco comments:    quit 20 plus years  Vaping Use   Vaping status: Never Used  Substance Use Topics   Alcohol use: Not Currently     Alcohol/week: 2.0 standard drinks of alcohol    Types: 2 Cans of beer per week    Comment: SOCIAL   Drug use: No     Allergies   Naproxen and Rosuvastatin    Review of Systems Review of Systems  Constitutional:  Positive for appetite change, fatigue and fever.  HENT:  Positive for congestion.   Respiratory:  Positive for cough and shortness of breath.   Cardiovascular:  Positive for chest pain. Negative for palpitations and leg swelling.  Gastrointestinal:  Positive for abdominal pain, nausea and vomiting. Negative for blood in stool, constipation, diarrhea and rectal pain.  Genitourinary:  Positive for difficulty urinating, dysuria and flank pain. Negative for hematuria.  Musculoskeletal:  Positive for back pain.  Neurological:  Negative for dizziness and headaches.     Physical Exam Triage Vital Signs ED Triage Vitals  Encounter Vitals Group     BP 06/28/23 0849 (!) 167/81     Systolic BP Percentile --      Diastolic BP Percentile --      Pulse Rate 06/28/23 0849 (!) 115     Resp --      Temp 06/28/23 0849 (!) 101 F (38.3 C)     Temp src --      SpO2 06/28/23 0849 (!) 89 %     Weight --      Height --      Head Circumference --      Peak Flow --      Pain Score 06/28/23 0857 0     Pain Loc --      Pain Education --      Exclude from Growth Chart --    No data found.  Updated Vital Signs BP (!) 167/81 (BP Location: Left Arm)   Pulse (!) 115   Temp (!) 101 F (38.3 C)   SpO2 100%   Physical Exam Vitals and nursing note reviewed.  Constitutional:      General: He is not in acute distress.    Appearance: Normal appearance. He is well-developed. He is ill-appearing.  HENT:     Head: Normocephalic and atraumatic.  Eyes:     Conjunctiva/sclera: Conjunctivae normal.  Cardiovascular:     Rate and Rhythm: Normal rate and regular rhythm.  Pulmonary:     Effort: Respiratory distress present.     Breath sounds: Wheezing (RLL) present.  Abdominal:      Palpations: Abdomen is soft.     Tenderness: There is abdominal tenderness (RUQ--hx cholecystectomy). There is right CVA tenderness  and left CVA tenderness.  Musculoskeletal:     Cervical back: Neck supple.  Skin:    General: Skin is warm and dry.     Capillary Refill: Capillary refill takes less than 2 seconds.  Neurological:     General: No focal deficit present.     Mental Status: He is alert. Mental status is at baseline.     Motor: No weakness.     Gait: Gait normal.  Psychiatric:        Mood and Affect: Mood normal.      UC Treatments / Results  Labs (all labs ordered are listed, but only abnormal results are displayed) Labs Reviewed  GLUCOSE, CAPILLARY - Abnormal; Notable for the following components:      Result Value   Glucose-Capillary 200 (*)    All other components within normal limits  CBG MONITORING, ED    EKG   Radiology CT Angio Chest PE W and/or Wo Contrast Result Date: 06/28/2023 CLINICAL DATA:  Hematuria and renal calculi.  Shortness of breath. EXAM: CT ANGIOGRAPHY CHEST CT ABDOMEN AND PELVIS WITH CONTRAST TECHNIQUE: Multidetector CT imaging of the chest was performed using the standard protocol during bolus administration of intravenous contrast. Multiplanar CT image reconstructions and MIPs were obtained to evaluate the vascular anatomy. Multidetector CT imaging of the abdomen and pelvis was performed using the standard protocol during bolus administration of intravenous contrast. RADIATION DOSE REDUCTION: This exam was performed according to the departmental dose-optimization program which includes automated exposure control, adjustment of the mA and/or kV according to patient size and/or use of iterative reconstruction technique. CONTRAST:  75mL OMNIPAQUE  IOHEXOL  350 MG/ML SOLN COMPARISON:  Multiple exams, including 06/19/2023 and chest radiograph 06/28/2023 FINDINGS: Despite efforts by the technologist and patient, motion artifact is present on today's exam  and could not be eliminated. This reduces exam sensitivity and specificity. CTA CHEST FINDINGS Cardiovascular: No filling defect is identified in the pulmonary arterial tree to suggest pulmonary embolus. Coronary, aortic arch, and branch vessel atherosclerotic vascular disease. Prior CABG. Mild to moderate cardiomegaly. Mediastinum/Nodes: 1.0 cm right lower paratracheal node on image 59 series 2, likely inflammatory/reactive. Similarly there is an enlarged right infrahilar lymph node measuring 1.3 cm in short axis on image 91 series 2 which is likely reactive adenopathy given other findings in the right lower lobe. A left lower lobe lymph node is upper normal sized at 0.8 cm short axis. Lungs/Pleura: Emphysema. Airspace opacity in the right lower lobe, a component of which is hazy and ground-glass compatible with a component of alveolitis. This area was clear on 06/19/2023 and accordingly is most compatible with pneumonia. Musculoskeletal: Lower thoracic spondylosis. Review of the MIP images confirms the above findings. CT ABDOMEN and PELVIS FINDINGS Hepatobiliary: Cholecystectomy.  Otherwise unremarkable. Pancreas: Unremarkable Spleen: Unremarkable Adrenals/Urinary Tract: Both adrenal glands appear normal. Persistent 1.7 cm calculus in the right renal pelvis with surrounding inflammatory stranding in the renal pelvis adipose tissue. Borderline fullness of a posterior left kidney upper pole calyx similar to previous without overt hydronephrosis. Likewise stable nonobstructive 1.2 cm right kidney lower pole calculus. No compelling findings of pyelonephritis. Urinary bladder unremarkable. Stomach/Bowel: Sigmoid colon diverticulosis. Vascular/Lymphatic: Atherosclerosis is present, including aortoiliac atherosclerotic disease. Atheromatous plaque proximally in the superior mesenteric artery and along the origin of the celiac trunk, without overt occlusion. Reproductive: Unremarkable Other: No supplemental  non-categorized findings. Musculoskeletal: Grade 1 degenerative anterolisthesis at L4-5. Spondylosis, degenerative disc disease, and congenitally short pedicles cause multilevel lumbar impingement especially at L3-4, L4-5,  and L5-S1. Review of the MIP images confirms the above findings. IMPRESSION: 1. No filling defect is identified in the pulmonary arterial tree to suggest pulmonary embolus. 2. Right lower lobe pneumonia with a component of alveolitis. 3. Persistent 1.7 cm calculus in the right renal pelvis with surrounding inflammatory stranding in the renal pelvis adipose tissue. No overt hydronephrosis. 4. Stable nonobstructive 1.2 cm right kidney lower pole calculus. 5. Sigmoid colon diverticulosis. 6. Multilevel lumbar impingement. 7. Aortic Atherosclerosis (ICD10-I70.0) and Emphysema (ICD10-J43.9). Electronically Signed   By: Freida Jes M.D.   On: 06/28/2023 12:54   CT ABDOMEN PELVIS W CONTRAST Result Date: 06/28/2023 CLINICAL DATA:  Hematuria and renal calculi.  Shortness of breath. EXAM: CT ANGIOGRAPHY CHEST CT ABDOMEN AND PELVIS WITH CONTRAST TECHNIQUE: Multidetector CT imaging of the chest was performed using the standard protocol during bolus administration of intravenous contrast. Multiplanar CT image reconstructions and MIPs were obtained to evaluate the vascular anatomy. Multidetector CT imaging of the abdomen and pelvis was performed using the standard protocol during bolus administration of intravenous contrast. RADIATION DOSE REDUCTION: This exam was performed according to the departmental dose-optimization program which includes automated exposure control, adjustment of the mA and/or kV according to patient size and/or use of iterative reconstruction technique. CONTRAST:  75mL OMNIPAQUE  IOHEXOL  350 MG/ML SOLN COMPARISON:  Multiple exams, including 06/19/2023 and chest radiograph 06/28/2023 FINDINGS: Despite efforts by the technologist and patient, motion artifact is present on today's  exam and could not be eliminated. This reduces exam sensitivity and specificity. CTA CHEST FINDINGS Cardiovascular: No filling defect is identified in the pulmonary arterial tree to suggest pulmonary embolus. Coronary, aortic arch, and branch vessel atherosclerotic vascular disease. Prior CABG. Mild to moderate cardiomegaly. Mediastinum/Nodes: 1.0 cm right lower paratracheal node on image 59 series 2, likely inflammatory/reactive. Similarly there is an enlarged right infrahilar lymph node measuring 1.3 cm in short axis on image 91 series 2 which is likely reactive adenopathy given other findings in the right lower lobe. A left lower lobe lymph node is upper normal sized at 0.8 cm short axis. Lungs/Pleura: Emphysema. Airspace opacity in the right lower lobe, a component of which is hazy and ground-glass compatible with a component of alveolitis. This area was clear on 06/19/2023 and accordingly is most compatible with pneumonia. Musculoskeletal: Lower thoracic spondylosis. Review of the MIP images confirms the above findings. CT ABDOMEN and PELVIS FINDINGS Hepatobiliary: Cholecystectomy.  Otherwise unremarkable. Pancreas: Unremarkable Spleen: Unremarkable Adrenals/Urinary Tract: Both adrenal glands appear normal. Persistent 1.7 cm calculus in the right renal pelvis with surrounding inflammatory stranding in the renal pelvis adipose tissue. Borderline fullness of a posterior left kidney upper pole calyx similar to previous without overt hydronephrosis. Likewise stable nonobstructive 1.2 cm right kidney lower pole calculus. No compelling findings of pyelonephritis. Urinary bladder unremarkable. Stomach/Bowel: Sigmoid colon diverticulosis. Vascular/Lymphatic: Atherosclerosis is present, including aortoiliac atherosclerotic disease. Atheromatous plaque proximally in the superior mesenteric artery and along the origin of the celiac trunk, without overt occlusion. Reproductive: Unremarkable Other: No supplemental  non-categorized findings. Musculoskeletal: Grade 1 degenerative anterolisthesis at L4-5. Spondylosis, degenerative disc disease, and congenitally short pedicles cause multilevel lumbar impingement especially at L3-4, L4-5, and L5-S1. Review of the MIP images confirms the above findings. IMPRESSION: 1. No filling defect is identified in the pulmonary arterial tree to suggest pulmonary embolus. 2. Right lower lobe pneumonia with a component of alveolitis. 3. Persistent 1.7 cm calculus in the right renal pelvis with surrounding inflammatory stranding in the renal pelvis adipose tissue. No overt  hydronephrosis. 4. Stable nonobstructive 1.2 cm right kidney lower pole calculus. 5. Sigmoid colon diverticulosis. 6. Multilevel lumbar impingement. 7. Aortic Atherosclerosis (ICD10-I70.0) and Emphysema (ICD10-J43.9). Electronically Signed   By: Freida Jes M.D.   On: 06/28/2023 12:54   DG Chest Port 1 View Result Date: 06/28/2023 CLINICAL DATA:  Questions sepsis.  Fever and shortness of breath. EXAM: PORTABLE CHEST 1 VIEW COMPARISON:  11/21/2016 FINDINGS: Previous median sternotomy for CABG procedure. Mild cardiac enlargement. No pleural fluid or interstitial edema. Increase opacity within the right lower lung is identified which may reflect atelectasis or pneumonia. Left lung appears clear. IMPRESSION: Right lower lung opacity compatible with atelectasis or pneumonia. Electronically Signed   By: Kimberley Penman M.D.   On: 06/28/2023 10:29    Study Result  Narrative & Impression  CLINICAL DATA:  Painless microhematuria.   EXAM: CT ABDOMEN AND PELVIS WITHOUT AND WITH CONTRAST   TECHNIQUE: Multidetector CT imaging of the abdomen and pelvis was performed following the standard protocol before and following the bolus administration of intravenous contrast.   RADIATION DOSE REDUCTION: This exam was performed according to the departmental dose-optimization program which includes automated exposure control,  adjustment of the mA and/or kV according to patient size and/or use of iterative reconstruction technique.   CONTRAST:  100mL ISOVUE -300 IOPAMIDOL  (ISOVUE -300) INJECTION 61%   COMPARISON:  CT scan abdomen and pelvis from 05/30/2022.   FINDINGS: Lower chest: The lung bases are clear. No pleural effusion. The heart is normal in size. No pericardial effusion.   Hepatobiliary: The liver is normal in size. Non-cirrhotic configuration. No suspicious mass. No intrahepatic or extrahepatic bile duct dilation. Gallbladder is surgically absent.   Pancreas: Unremarkable. No pancreatic ductal dilatation or surrounding inflammatory changes.   Spleen: Within normal limits. No focal lesion.   Adrenals/Urinary Tract: Adrenal glands are unremarkable. No suspicious renal mass. No left hydroureteronephrosis or nephroureterolithiasis.   There is a 1.4 x 1.9 cm right renal pelvic calculus. There is a small branching calculus in the right kidney lower pole calyx with larger component measuring up to 7 x 12 mm. There is associated right urothelial thickening and mild fat stranding surrounding the renal pelvis. There is mild fullness in the right renal collecting system. Right ureter is nondilated. No right ureterolithiasis. Findings are essentially unchanged since the prior study. Urinary bladder is under distended, precluding optimal assessment. However, no large mass or stones identified. No perivesical fat stranding.   Stomach/Bowel: No disproportionate dilation of the small or large bowel loops. No evidence of abnormal bowel wall thickening or inflammatory changes. The appendix is unremarkable. There are multiple diverticula mainly in the left hemi colon, without imaging signs of diverticulitis.   Vascular/Lymphatic: No ascites or pneumoperitoneum. No abdominal or pelvic lymphadenopathy, by size criteria. No aneurysmal dilation of the major abdominal arteries. There are moderate  peripheral atherosclerotic vascular calcifications of the aorta and its major branches.   Reproductive: Normal size prostate. Symmetric seminal vesicles.   Other: There are fat containing umbilical and bilateral inguinal hernias. The soft tissues and abdominal wall are otherwise unremarkable.   Musculoskeletal: No suspicious osseous lesions. There are mild - moderate multilevel degenerative changes in the visualized spine.   IMPRESSION: 1. There is a 1.4 x 1.9 cm right renal pelvic calculus with associated mild fullness in the right renal collecting system and urothelial thickening and mild fat stranding surrounding the renal pelvis. There is also a small branching calculus in the right kidney lower pole calyx. Findings are essentially unchanged  since the prior study from 05/30/2022. No left hydroureteronephrosis or nephroureterolithiasis. 2. Multiple other nonacute observations, as described above.   Aortic Atherosclerosis (ICD10-I70.0).     Electronically Signed   By: Beula Brunswick M.D.   On: 06/19/2023 14:39     Procedures ED EKG  Date/Time: 06/28/2023 9:09 AM  Performed by: Floydene Hy, PA-C Authorized by: Floydene Hy, PA-C   Previous ECG:    Previous ECG:  Compared to current   Similarity:  Changes noted Interpretation:    Interpretation: abnormal   Rate:    ECG rate:  100 Rhythm:    Rhythm: sinus rhythm   QRS:    QRS axis:  Normal   QRS intervals:  Normal   QRS conduction: normal   ST segments:    ST segments:  Depression   Details:  Lateral leads T waves:    T waves: non-specific   Other findings:    Other findings: prolonged qTc interval   Comments:     Sinus rhythm with ST wave inversion in lateral leads. Prolonged QT--changed compared to EKG from 2016  (including critical care time)  Medications Ordered in UC Medications  sodium chloride  0.9 % bolus 1,000 mL (1,000 mLs Intravenous New Bag/Given 06/28/23 0909)    Initial Impression /  Assessment and Plan / UC Course  I have reviewed the triage vital signs and the nursing notes.  Pertinent labs & imaging results that were available during my care of the patient were reviewed by me and considered in my medical decision making (see chart for details).   73 year old male with history of hypertension, type 2 diabetes, kidney stones, and previous MI presents for 2-day history of fever, fatigue, shortness of breath, cough, congestion, chest pain, upper abdominal pain, nausea and vomiting.   Recent diagnosis of a very large kidney stone.  Upcoming evaluation next week for surgery to remove it.   CT from 5/15: "IMPRESSION: 1. There is a 1.4 x 1.9 cm right renal pelvic calculus with associated mild fullness in the right renal collecting system and urothelial thickening and mild fat stranding surrounding the renal pelvis. There is also a small branching calculus in the right kidney lower pole calyx. Findings are essentially unchanged since the prior study from 05/30/2022. No left hydroureteronephrosis or nephroureterolithiasis. 2. Multiple other nonacute observations, as described above."  Fingerstick gulose: 200  Patient actively vomiting.  Blood pressure elevated 167/81.  Temp 101.  Pulse elevated at 115 bpm.  Oxygen 89% on room air.  He speaks a few words at a time.  Moderate respiratory distress.  Wheezes and rhonchi right lung base and tenderness of right upper quadrant.  Status postcholecystectomy.  Heart regular rhythm but tachycardic.  Patient placed on 15 L nonrebreather and oxygen 100%.  EKG shows ST-T wave inversion and prolonged QT.  This is new compared to EKG from 2016.  Concern for sepsis.   EMS was contacted for concerns regarding sepsis.  Suspected secondary to pneumonia vs urinary tract infection with obstructing stone? Nursing staff to start IV fluids.  Ordered 8 mg IM Zofran , it was drawn up by our nursing staff but this was canceled by EMS.  They did not  want us  to give patient Zofran . Patient leaving in stable condition in route to [facility] ED.   Final Clinical Impressions(s) / UC Diagnoses   Final diagnoses:  Sepsis, due to unspecified organism, unspecified whether acute organ dysfunction present [facility])  Respiratory distress  Nephrolithiasis  Nausea and vomiting,  unspecified vomiting type  Acute cough     Discharge Instructions      You have been advised to follow up immediately in the emergency department for concerning signs.symptoms. If you declined EMS transport, please have a family member take you directly to the ED at this time. Do not delay. Based on concerns about condition, if you do not follow up in th e ED, you may risk poor outcomes including worsening of condition, delayed treatment and potentially life threatening issues. If you have declined to go to the ED at this time, you should call your PCP immediately to set up a follow up appointment.  Go to ED for red flag symptoms, including; fevers you cannot reduce with Tylenol /Motrin, severe headaches, vision changes, numbness/weakness in part of the body, lethargy, confusion, intractable vomiting, severe dehydration, chest pain, breathing difficulty, severe persistent abdominal or pelvic pain, signs of severe infection (increased redness, swelling of an area), feeling faint or passing out, dizziness, etc. You should especially go to the ED for sudden acute worsening of condition if you do not elect to go at this time.   ED Prescriptions   None    PDMP not reviewed this encounter.   Floydene Hy, PA-C 06/28/23 1402

## 2023-06-28 NOTE — H&P (Signed)
 History and Physical    Patient: Kevin Perry KGM:[id number] DOB: [birth year] DOA: 06/28/2023 DOS: the patient was seen and examined on 06/28/2023 PCP: Raina Bunting, DO  Patient coming from: Home  Chief Complaint: No chief complaint on file.  HPI: Kevin Perry is a 73 y.o. male with medical history significant of CAD s/p CABG (2021), hypertension, and type 2 diabetes, anxiety, HFpEF, vitamin B12 deficiency, BPH, who presents to the ED due to fever and malaise.  [name] states that on 06/26/2023, he developed rapidly progressive fevers, nausea, vomiting, nonproductive cough and shortness of breath.  He states that it was hard for him to recognize that he was short of breath as he has never experienced that before.  For the last 2 days, he notes that he has been feeling quite well and has not had much to eat or drink.  He denies any chest pain, palpitations or lower extremity swelling.  ED course: On arrival to the ED, patient was febrile at 101 with blood pressure of 167/81 and heart rate of 115.  He was saturating at 89% on room air.  Initial workup notable for unremarkable CBC, bicarb 18, anion gap 17, creatinine 0.94 with GFR above 60.  Total bilirubin 2.8.  Lactic acid negative x 2.  BNP and troponin within normal limits.  COVID-19, influenza and RSV PCR negative.  Urinalysis with ketonuria, hematuria, proteinuria, but no bacteria.  CTA of the chest and CT abdomen obtained with no evidence of PE, however right lower lobe pneumonia with a component of alveolitis, persistent 1.7 cm calculus in the right renal pelvis and 1.2 cm right kidney lower pole calculus.  Patient was started on IV fluids with resolution of metabolic acidosis.  Patient started on cefepime , doxycycline , Flagyl, and vancomycin .  TRH contacted for admission.   Review of Systems: As mentioned in the history of present illness. All other systems reviewed and are negative.  Past Medical History:  Diagnosis  Date   Anxiety    Arthritis    Coronary artery disease    Diabetes mellitus without complication (HCC)    Hypertension    Hypertensive retinopathy    Myocardial infarction [facility])    Postoperative nausea 11/27/2019   Past Surgical History:  Procedure Laterality Date   BIOPSY  08/27/2022   Procedure: BIOPSY;  Surgeon: Shane Darling, MD;  Location: [facility] ENDOSCOPY;  Service: Endoscopy;;   CATARACT EXTRACTION     CHOLECYSTECTOMY     COLONOSCOPY WITH PROPOFOL  N/A 08/27/2022   Procedure: COLONOSCOPY WITH PROPOFOL ;  Surgeon: Shane Darling, MD;  Location: [facility] ENDOSCOPY;  Service: Endoscopy;  Laterality: N/A;   CORONARY ANGIOPLASTY WITH STENT PLACEMENT N/A 02/04/2006   ESOPHAGOGASTRODUODENOSCOPY (EGD) WITH PROPOFOL  N/A 08/27/2022   Procedure: ESOPHAGOGASTRODUODENOSCOPY (EGD) WITH PROPOFOL ;  Surgeon: Shane Darling, MD;  Location: [facility] ENDOSCOPY;  Service: Endoscopy;  Laterality: N/A;   EYE SURGERY     FOOT SURGERY Left    DOS 8.1.14 HALLIX IPJ FUSION, SECOND MET OSTEOTOMY W/SCREW, HAM TOE REPAIR 2,,4 , PARTIAL AMP 3RD DIGIT    GALLBLADDER SURGERY     HEMOSTASIS CLIP PLACEMENT  08/27/2022   Procedure: HEMOSTASIS CLIP PLACEMENT;  Surgeon: Shane Darling, MD;  Location: [facility] ENDOSCOPY;  Service: Endoscopy;;   KNEE SURERY Right    LEFT HEART CATH AND CORONARY ANGIOGRAPHY Left 11/17/2019   Procedure: LEFT HEART CATH AND CORONARY ANGIOGRAPHY;  Surgeon: Ronney Cola, MD;  Location: [facility]IVE [facility];  Service: Cardiovascular;  Laterality: Left;  MYRINGOTOMY WITH TUBE PLACEMENT Right 10/25/2021   Procedure: MYRINGOTOMY WITH BUTTERFLY TUBE PLACEMENT;  Surgeon: Mellody Sprout, MD;  Location: [facility];  Service: ENT;  Laterality: Right;   POLYPECTOMY  08/27/2022   Procedure: POLYPECTOMY;  Surgeon: Shane Darling, MD;  Location: [facility];  Service: Endoscopy;;   SUBMUCOSAL LIFTING INJECTION  08/27/2022   Procedure: SUBMUCOSAL LIFTING INJECTION;  Surgeon:  Shane Darling, MD;  Location: [facility];  Service: Endoscopy;;  ELEVIEW ASCENDING COLON POLYP   TOTAL KNEE ARTHROPLASTY Right 08/30/2014   Procedure: TOTAL KNEE ARTHROPLASTY;  Surgeon: Molli Angelucci, MD;  Location: [facility] ORS;  Service: Orthopedics;  Laterality: Right;   Social History:  reports that he quit smoking about 18 years ago. His smoking use included cigarettes. He quit smokeless tobacco use about 19 years ago. He reports that he does not currently use alcohol after a past usage of about 2.0 standard drinks of alcohol per week. He reports that he does not use drugs.  Allergies  Allergen Reactions   Naproxen Swelling and Anaphylaxis   Rosuvastatin  Other (See Comments)    Family History  Problem Relation Age of Onset   Heart attack Mother    Diabetes Father    Hypertension Father    Heart attack Sister     Prior to Admission medications   Medication Sig Start Date End Date Taking? Authorizing Provider  amLODipine  (NORVASC ) 10 MG tablet TAKE 1 TABLET BY MOUTH ONCE DAILY 06/25/23   Romeo Co, Kayleen Party, DO  amoxicillin -clavulanate (AUGMENTIN ) 875-125 MG tablet Take 1 tablet by mouth 2 (two) times daily. 06/04/23   Karamalegos, Kayleen Party, DO  aspirin  EC 81 MG tablet Take 81 mg by mouth daily. Swallow whole.    [provider]  baclofen  (LIORESAL ) 10 MG tablet Take 0.5-1 tablets (5-10 mg total) by mouth 3 (three) times daily as needed for muscle spasms. 12/24/22   Karamalegos, Kayleen Party, DO  buPROPion  (WELLBUTRIN  XL) 300 MG 24 hr tablet Take 300 mg by mouth daily.    [provider]  Cholecalciferol (VITAMIN D3) 125 MCG (5000 UT) CAPS Take 1 capsule (5,000 Units total) by mouth daily. For 8 weeks, then start Vitamin D3 2,000 units daily (OTC) 01/20/23   Karamalegos, Kayleen Party, DO  cyanocobalamin  (VITAMIN B12) 1000 MCG tablet Take 1 tablet (1,000 mcg total) by mouth daily. 01/20/23   Karamalegos, Kayleen Party, DO  empagliflozin (JARDIANCE) 25 MG TABS  tablet Take 1 tablet by mouth daily. 02/03/23 02/03/24  [provider]  escitalopram  (LEXAPRO ) 20 MG tablet TAKE 1 TABLET BY MOUTH ONCE DAILY AS DIRECTED TAPER AS DIRECTED. OFF ZOLOFT  AND ON TO ESCITALOPRAM  05/02/23   Raina Bunting, DO  Evolocumab  (REPATHA  SURECLICK) 140 MG/ML SOAJ Inject 140 mg into the skin every 14 (fourteen) days.    [provider]  folic acid  (FOLVITE ) 1 MG tablet Take 1 mg by mouth See admin instructions. Take 1 mg daily except skip dose on Wednesdays (methotrexate day) 08/27/16   [provider]  furosemide  (LASIX ) 40 MG tablet TAKE 1 TABLET BY MOUTH ONCE DAILY 08/01/22   Romeo Co, Kayleen Party, DO  gabapentin  (NEURONTIN ) 100 MG capsule TAKE 1 CAPSULE BY MOUTH TWICE DAILY 06/10/23   Karamalegos, Kayleen Party, DO  lisinopril  (ZESTRIL ) 20 MG tablet Take 1 tablet by mouth 2 (two) times daily. 10/29/21   [provider]  loratadine  (CLARITIN ) 10 MG tablet Take 10 mg by mouth every morning.    [provider]  metFORMIN  (GLUCOPHAGE ) 500  MG tablet TAKE 2 TABLETS BY MOUTH ONCE EVERY MORNING WITH BREAKFAST AND 1 TABLET ONCEEVERY EVENING WITH DINNER 06/10/23   Karamalegos, Kayleen Party, DO  methotrexate (RHEUMATREX) 2.5 MG tablet Take 10 mg by mouth once a week. On Wednesdays 06/05/22   [provider]  metoprolol  tartrate (LOPRESSOR ) 25 MG tablet TAKE 1 TABLET BY MOUTH TWICE DAILY 06/10/23   Romeo Co, Kayleen Party, DO  nitroGLYCERIN  (NITROSTAT ) 0.4 MG SL tablet Place under the tongue. 11/18/19   [provider]  TRULICITY  3 MG/0.5ML SOPN INJECT 3 MG (0.5 ML) UNDER THE SKIN ONCE A WEEK 12/12/21   Raina Bunting, DO    Physical Exam: Vitals:   06/28/23 1330 06/28/23 1400 06/28/23 1430 06/28/23 1545  BP:  (!) 168/85 (!) 177/81 (!) 157/83  Pulse: 74 68 75 79  Resp: 19 (!) 23 (!) 25 18  Temp:    98.4 F (36.9 C)  TempSrc:    Oral  SpO2: 91% 96% 100% 97%  Weight:       Physical Exam Vitals and nursing  note reviewed.  Constitutional:      Appearance: He is obese. He is ill-appearing.  HENT:     Head: Normocephalic and atraumatic.     Mouth/Throat:     Mouth: Mucous membranes are dry.  Eyes:     Extraocular Movements: Extraocular movements intact.     Pupils: Pupils are equal, round, and reactive to light.  Cardiovascular:     Rate and Rhythm: Normal rate and regular rhythm.     Heart sounds: No murmur heard. Pulmonary:     Effort: Pulmonary effort is normal. No respiratory distress.     Breath sounds: Decreased breath sounds (Diminished in the right lower field) present.  Abdominal:     General: Bowel sounds are normal. There is no distension.     Palpations: Abdomen is soft.     Tenderness: There is no abdominal tenderness. There is no guarding.  Musculoskeletal:     Right lower leg: No edema.     Left lower leg: No edema.  Skin:    General: Skin is warm and dry.  Neurological:     General: No focal deficit present.     Mental Status: He is alert and oriented to person, place, and time. Mental status is at baseline.  Psychiatric:        Mood and Affect: Mood normal.        Behavior: Behavior normal.    Data Reviewed: CBC with WBC of 9.3, hemoglobin of 16.3, platelets 116 CMP with sodium of 136, potassium 3.5, bicarb 18, glucose 192, BUN 16, creatinine 0.84, anion gap 17, AST 17, ALT 19, GFR above 60 Repeat BMP with with bicarb of 23 and anion gap of 12 BNP within normal limits at 91 Troponin 9 and 14 Lactic acid within normal limits x 2 COVID #19, influenza and RSV PCR negative Urinalysis with ketonuria, glucosuria, hematuria and proteinuria  EKG personally reviewed.  Sinus rhythm with rate of 94.  PACs noted.  Nonspecific T wave abnormalities throughout.  CT Angio Chest PE W and/or Wo Contrast Result Date: 06/28/2023 CLINICAL DATA:  Hematuria and renal calculi.  Shortness of breath. EXAM: CT ANGIOGRAPHY CHEST CT ABDOMEN AND PELVIS WITH CONTRAST TECHNIQUE:  Multidetector CT imaging of the chest was performed using the standard protocol during bolus administration of intravenous contrast. Multiplanar CT image reconstructions and MIPs were obtained to evaluate the vascular anatomy. Multidetector CT imaging of the abdomen and pelvis  was performed using the standard protocol during bolus administration of intravenous contrast. RADIATION DOSE REDUCTION: This exam was performed according to the departmental dose-optimization program which includes automated exposure control, adjustment of the mA and/or kV according to patient size and/or use of iterative reconstruction technique. CONTRAST:  75mL OMNIPAQUE  IOHEXOL  350 MG/ML SOLN COMPARISON:  Multiple exams, including 06/19/2023 and chest radiograph 06/28/2023 FINDINGS: Despite efforts by the technologist and patient, motion artifact is present on today's exam and could not be eliminated. This reduces exam sensitivity and specificity. CTA CHEST FINDINGS Cardiovascular: No filling defect is identified in the pulmonary arterial tree to suggest pulmonary embolus. Coronary, aortic arch, and branch vessel atherosclerotic vascular disease. Prior CABG. Mild to moderate cardiomegaly. Mediastinum/Nodes: 1.0 cm right lower paratracheal node on image 59 series 2, likely inflammatory/reactive. Similarly there is an enlarged right infrahilar lymph node measuring 1.3 cm in short axis on image 91 series 2 which is likely reactive adenopathy given other findings in the right lower lobe. A left lower lobe lymph node is upper normal sized at 0.8 cm short axis. Lungs/Pleura: Emphysema. Airspace opacity in the right lower lobe, a component of which is hazy and ground-glass compatible with a component of alveolitis. This area was clear on 06/19/2023 and accordingly is most compatible with pneumonia. Musculoskeletal: Lower thoracic spondylosis. Review of the MIP images confirms the above findings. CT ABDOMEN and PELVIS FINDINGS Hepatobiliary:  Cholecystectomy.  Otherwise unremarkable. Pancreas: Unremarkable Spleen: Unremarkable Adrenals/Urinary Tract: Both adrenal glands appear normal. Persistent 1.7 cm calculus in the right renal pelvis with surrounding inflammatory stranding in the renal pelvis adipose tissue. Borderline fullness of a posterior left kidney upper pole calyx similar to previous without overt hydronephrosis. Likewise stable nonobstructive 1.2 cm right kidney lower pole calculus. No compelling findings of pyelonephritis. Urinary bladder unremarkable. Stomach/Bowel: Sigmoid colon diverticulosis. Vascular/Lymphatic: Atherosclerosis is present, including aortoiliac atherosclerotic disease. Atheromatous plaque proximally in the superior mesenteric artery and along the origin of the celiac trunk, without overt occlusion. Reproductive: Unremarkable Other: No supplemental non-categorized findings. Musculoskeletal: Grade 1 degenerative anterolisthesis at L4-5. Spondylosis, degenerative disc disease, and congenitally short pedicles cause multilevel lumbar impingement especially at L3-4, L4-5, and L5-S1. Review of the MIP images confirms the above findings. IMPRESSION: 1. No filling defect is identified in the pulmonary arterial tree to suggest pulmonary embolus. 2. Right lower lobe pneumonia with a component of alveolitis. 3. Persistent 1.7 cm calculus in the right renal pelvis with surrounding inflammatory stranding in the renal pelvis adipose tissue. No overt hydronephrosis. 4. Stable nonobstructive 1.2 cm right kidney lower pole calculus. 5. Sigmoid colon diverticulosis. 6. Multilevel lumbar impingement. 7. Aortic Atherosclerosis (ICD10-I70.0) and Emphysema (ICD10-J43.9). Electronically Signed   By: Freida Jes M.D.   On: 06/28/2023 12:54   CT ABDOMEN PELVIS W CONTRAST Result Date: 06/28/2023 CLINICAL DATA:  Hematuria and renal calculi.  Shortness of breath. EXAM: CT ANGIOGRAPHY CHEST CT ABDOMEN AND PELVIS WITH CONTRAST TECHNIQUE:  Multidetector CT imaging of the chest was performed using the standard protocol during bolus administration of intravenous contrast. Multiplanar CT image reconstructions and MIPs were obtained to evaluate the vascular anatomy. Multidetector CT imaging of the abdomen and pelvis was performed using the standard protocol during bolus administration of intravenous contrast. RADIATION DOSE REDUCTION: This exam was performed according to the departmental dose-optimization program which includes automated exposure control, adjustment of the mA and/or kV according to patient size and/or use of iterative reconstruction technique. CONTRAST:  75mL OMNIPAQUE  IOHEXOL  350 MG/ML SOLN COMPARISON:  Multiple exams, including  06/19/2023 and chest radiograph 06/28/2023 FINDINGS: Despite efforts by the technologist and patient, motion artifact is present on today's exam and could not be eliminated. This reduces exam sensitivity and specificity. CTA CHEST FINDINGS Cardiovascular: No filling defect is identified in the pulmonary arterial tree to suggest pulmonary embolus. Coronary, aortic arch, and branch vessel atherosclerotic vascular disease. Prior CABG. Mild to moderate cardiomegaly. Mediastinum/Nodes: 1.0 cm right lower paratracheal node on image 59 series 2, likely inflammatory/reactive. Similarly there is an enlarged right infrahilar lymph node measuring 1.3 cm in short axis on image 91 series 2 which is likely reactive adenopathy given other findings in the right lower lobe. A left lower lobe lymph node is upper normal sized at 0.8 cm short axis. Lungs/Pleura: Emphysema. Airspace opacity in the right lower lobe, a component of which is hazy and ground-glass compatible with a component of alveolitis. This area was clear on 06/19/2023 and accordingly is most compatible with pneumonia. Musculoskeletal: Lower thoracic spondylosis. Review of the MIP images confirms the above findings. CT ABDOMEN and PELVIS FINDINGS Hepatobiliary:  Cholecystectomy.  Otherwise unremarkable. Pancreas: Unremarkable Spleen: Unremarkable Adrenals/Urinary Tract: Both adrenal glands appear normal. Persistent 1.7 cm calculus in the right renal pelvis with surrounding inflammatory stranding in the renal pelvis adipose tissue. Borderline fullness of a posterior left kidney upper pole calyx similar to previous without overt hydronephrosis. Likewise stable nonobstructive 1.2 cm right kidney lower pole calculus. No compelling findings of pyelonephritis. Urinary bladder unremarkable. Stomach/Bowel: Sigmoid colon diverticulosis. Vascular/Lymphatic: Atherosclerosis is present, including aortoiliac atherosclerotic disease. Atheromatous plaque proximally in the superior mesenteric artery and along the origin of the celiac trunk, without overt occlusion. Reproductive: Unremarkable Other: No supplemental non-categorized findings. Musculoskeletal: Grade 1 degenerative anterolisthesis at L4-5. Spondylosis, degenerative disc disease, and congenitally short pedicles cause multilevel lumbar impingement especially at L3-4, L4-5, and L5-S1. Review of the MIP images confirms the above findings. IMPRESSION: 1. No filling defect is identified in the pulmonary arterial tree to suggest pulmonary embolus. 2. Right lower lobe pneumonia with a component of alveolitis. 3. Persistent 1.7 cm calculus in the right renal pelvis with surrounding inflammatory stranding in the renal pelvis adipose tissue. No overt hydronephrosis. 4. Stable nonobstructive 1.2 cm right kidney lower pole calculus. 5. Sigmoid colon diverticulosis. 6. Multilevel lumbar impingement. 7. Aortic Atherosclerosis (ICD10-I70.0) and Emphysema (ICD10-J43.9). Electronically Signed   By: Freida Jes M.D.   On: 06/28/2023 12:54   DG Chest Port 1 View Result Date: 06/28/2023 CLINICAL DATA:  Questions sepsis.  Fever and shortness of breath. EXAM: PORTABLE CHEST 1 VIEW COMPARISON:  11/21/2016 FINDINGS: Previous median sternotomy  for CABG procedure. Mild cardiac enlargement. No pleural fluid or interstitial edema. Increase opacity within the right lower lung is identified which may reflect atelectasis or pneumonia. Left lung appears clear. IMPRESSION: Right lower lung opacity compatible with atelectasis or pneumonia. Electronically Signed   By: Kimberley Penman M.D.   On: 06/28/2023 10:29   Results are pending, will review when available.  Assessment and Plan:  * Sepsis (HCC) Patient is presenting with fever, tachycardia, and hypoxia in the setting of community-acquired pneumonia.  Heart rate has responded well to IV fluids.  No evidence of shock at this time.  - S/p 2 L bolus.  Continue maintenance fluids - S/p vancomycin , Flagyl, cefepime , doxycycline  - Blood and urine cultures pending - Telemetry monitoring  CAP (community acquired pneumonia) Patient reports shortness of breath and dry cough, found to have a right lower lobe pneumonia on CT imaging.  - Continue with  azithromycin  and Rocephin - Guaifenesin  - Strep pneumo and Legionella urinary antigens  Bilateral nephrolithiasis Patient recently established with urology due to painless hematuria, at which time it was found that he has 2 very large kidney stones on the right and the left.  He is asymptomatic at this time with no CVA tenderness or abdominal pain.  Urinalysis with hematuria but no leukocytes, nitrites or bacteria.  EDP discussed with urology, who is recommending urine culture.  - Urine culture pending - If positive, plan to reconsult urology  Acute hypoxic respiratory failure (HCC) In the setting of community-acquired pneumonia.  On minimal supplemental oxygen.  - Continue supplemental oxygen to maintain oxygen saturation above 88% - Wean as tolerated  Paroxysmal atrial fibrillation (HCC) Has a history of postoperative atrial fibrillation with negative Holter monitor.  He is no longer on anticoagulation.  Sinus rhythm since arrival to the  ED  Coronary artery disease involving native coronary artery of native heart without angina pectoris Patient denies any chest pain at this time.  Troponin negative x 2.  - Continue home regimen  Type 2 diabetes mellitus with other specified complication (HCC) - A1c pending - Hold home regimen - SSI, moderate  Essential hypertension Hypertensive since arrival in the ED.  - Resume home regimen  Advance Care Planning:   Code Status: Full Code   Consults: Urology  Family Communication: No family at bedside  Severity of Illness: The appropriate patient status for this patient is INPATIENT. Inpatient status is judged to be reasonable and necessary in order to provide the required intensity of service to ensure the patient's safety. The patient's presenting symptoms, physical exam findings, and initial radiographic and laboratory data in the context of their chronic comorbidities is felt to place them at high risk for further clinical deterioration. Furthermore, it is not anticipated that the patient will be medically stable for discharge from the hospital within 2 midnights of admission.   * I certify that at the point of admission it is my clinical judgment that the patient will require inpatient hospital care spanning beyond 2 midnights from the point of admission due to high intensity of service, high risk for further deterioration and high frequency of surveillance required.*  Author: Avi Body, MD 06/28/2023 4:26 PM  For on call review [email or web address].

## 2023-06-28 NOTE — ED Provider Notes (Signed)
 [facility] Provider Note    Event Date/Time   First MD Initiated Contact with Patient 06/28/23 [phone]     (approximate)   History   No chief complaint on file.   HPI  Kevin Perry is a 73 y.o. male with history of type 2 diabetes, coronary disease, A-fib, obesity, kidney stones who comes in with concerns for fevers.  Patient reports that since Thursday he has not been feeling well.  He reports nausea, vomiting, some shortness of breath some upper abdominal pain.  He denies any flank tenderness.  He reports that he was recently seen by urology for a kidney stone.  He stated that he thought he just had the flu but he reports that the test was negative.  He reports some upper abdominal discomfort.  Patient's oxygen level was noted to be 89% and temperature of 101 at urgent care.    EMS stated 1 g of Tylenol  given did have a confirmed temperature with them.  On review of notes patient was seen on 06/19/2023 and there was a 1.4 x 1.9 right renal pelvic calculus.  This was unchanged from April 2024.  Patient is already had his gallbladder removed.    Physical Exam   Triage Vital Signs: ED Triage Vitals [06/28/23 0941]  Encounter Vitals Group     BP      Systolic BP Percentile      Diastolic BP Percentile      Pulse      Resp      Temp      Temp src      SpO2      Weight      Height      Head Circumference      Peak Flow      Pain Score 5     Pain Loc      Pain Education      Exclude from Growth Chart     Most recent vital signs: Vitals:   06/28/23 1100 06/28/23 1115  BP: (!) 145/74   Pulse: 83 82  Resp: (!) 26 (!) 25  Temp:    SpO2: (!) 88% 94%     General: Awake, no distress.  CV:  Good peripheral perfusion.  Resp:  Normal effort.  Abd:  No distention.  Some slight tenderness Other:     ED Results / Procedures / Treatments   Labs (all labs ordered are listed, but only abnormal results are displayed) Labs Reviewed  RESP PANEL BY  RT-PCR (RSV, FLU A&B, COVID)  RVPGX2  CULTURE, BLOOD (ROUTINE X 2)  CULTURE, BLOOD (ROUTINE X 2)  LACTIC ACID, PLASMA  LACTIC ACID, PLASMA  COMPREHENSIVE METABOLIC PANEL WITH GFR  CBC WITH DIFFERENTIAL/PLATELET  PROTIME-INR  URINALYSIS, W/ REFLEX TO CULTURE (INFECTION SUSPECTED)  TROPONIN I (HIGH SENSITIVITY)     EKG  My interpretation of EKG:  Sinus rhythm 94 without any ST elevation or T wave versions, normal intervals  RADIOLOGY I have reviewed the xray personally and interpreted possible right lower lobe pneumonia   PROCEDURES:  Critical Care performed: Yes, see critical care procedure note(s)  .1-3 Lead EKG Interpretation  Performed by: Lubertha Rush, MD Authorized by: Lubertha Rush, MD     Interpretation: normal     ECG rate:  80   ECG rate assessment: normal     Rhythm: sinus rhythm     Ectopy: none     Conduction: normal   .Critical Care  Performed by: Lubertha Rush, MD Authorized by: Lubertha Rush, MD   Critical care provider statement:    Critical care time (minutes):  30   Critical care was necessary to treat or prevent imminent or life-threatening deterioration of the following conditions:  Respiratory failure   Critical care was time spent personally by me on the following activities:  Development of treatment plan with patient or surrogate, discussions with consultants, evaluation of patient's response to treatment, examination of patient, ordering and review of laboratory studies, ordering and review of radiographic studies, ordering and performing treatments and interventions, pulse oximetry, re-evaluation of patient's condition and review of old charts    MEDICATIONS ORDERED IN ED: Medications  ceFEPIme  (MAXIPIME ) 2 g in sodium chloride  0.9 % 100 mL IVPB (0 g Intravenous Stopped 06/28/23 1027)  metroNIDAZOLE (FLAGYL) IVPB 500 mg (0 mg Intravenous Stopped 06/28/23 1105)  vancomycin  (VANCOCIN ) IVPB 1000 mg/200 mL premix (0 mg Intravenous Stopped  06/28/23 1106)  lactated ringers  bolus 1,000 mL (1,000 mLs Intravenous New Bag/Given 06/28/23 0955)  lactated ringers  bolus 1,000 mL (1,000 mLs Intravenous New Bag/Given 06/28/23 0952)  ondansetron  (ZOFRAN ) injection 4 mg (4 mg Intravenous Given 06/28/23 0958)  iohexol  (OMNIPAQUE ) 350 MG/ML injection 75 mL (75 mLs Intravenous Contrast Given 06/28/23 1157)     IMPRESSION / MDM / ASSESSMENT AND PLAN / ED COURSE  I reviewed the triage vital signs and the nursing notes.   Patient's presentation is most consistent with acute presentation with potential threat to life or bodily function.   Patient comes in with concerns for sepsis differential includes COVID, flu, pneumonia, abdominal pathology.  Patient was hypoxic and placed on 2 L  BNP is reassuring.  COVID, flu, RSV are negative lactate is normal CMP shows slightly low bicarb and slightly elevated glucose at 192 with an anion gap of 17 I suspect more likely related to dehydration.  Patient is getting some IV fluids.  They can repeat BMP after fluids to see if patient needs to be started on insulin  for possible euglycemic DKA although this seems less likely.  Urinalysis does have some signs of potential UTI and ketones to suggest dehydration.  Reevaluated patient and we discussed most likely causes pneumonia but he does report some flank pain and abdominal pain therefore will get CT imaging to rule out any infected kidney stone that is now moving down the kidney although with the size of his kidney stone I suspect that it is stone in the kidney.  We also discussed CT of his chest just to ensure no PE given x-ray shows at atelectasis first pneumonia he really does not have any change in cough I want to ensure that there is nothing else going on here.  Discuss with Dr Secundino Dach no urologic intervention need recommend urine culture.   Will d/w hospital for admission for sepsis from PNA on 2L will add atypical coverage.   The patient is on the cardiac  monitor to evaluate for evidence of arrhythmia and/or significant heart rate changes.      FINAL CLINICAL IMPRESSION(S) / ED DIAGNOSES   Final diagnoses:  Sepsis, due to unspecified organism, unspecified whether acute organ dysfunction present (HCC)  Acute respiratory failure with hypoxia (HCC)     Rx / DC Orders   ED Discharge Orders     None        Note:  This document was prepared using Dragon voice recognition software and may include unintentional dictation errors.   Lubertha Rush,  MD 06/28/23 1325

## 2023-06-28 NOTE — Discharge Instructions (Signed)
 You have been advised to follow up immediately in the emergency department for concerning signs.symptoms. If you declined EMS transport, please have a family member take you directly to the ED at this time. Do not delay. Based on concerns about condition, if you do not follow up in th e ED, you may risk poor outcomes including worsening of condition, delayed treatment and potentially life threatening issues. If you have declined to go to the ED at this time, you should call your PCP immediately to set up a follow up appointment.  Go to ED for red flag symptoms, including; fevers you cannot reduce with Tylenol/Motrin, severe headaches, vision changes, numbness/weakness in part of the body, lethargy, confusion, intractable vomiting, severe dehydration, chest pain, breathing difficulty, severe persistent abdominal or pelvic pain, signs of severe infection (increased redness, swelling of an area), feeling faint or passing out, dizziness, etc. You should especially go to the ED for sudden acute worsening of condition if you do not elect to go at this time.

## 2023-06-28 NOTE — ED Notes (Signed)
 Patient is being discharged from the Urgent Care and sent to the [facility] Emergency Department via EMS . Per Nancy Axon, PA, patient is in need of higher level of care due to kidney stone, abnormal EKG and vomiting. Patient is aware and verbalizes understanding of plan of care.  Vitals:   06/28/23 0849 06/28/23 0906  BP: (!) 167/81   Pulse: (!) 115   Temp: (!) 101 F (38.3 C)   SpO2: (!) 89% 100%

## 2023-06-28 NOTE — Assessment & Plan Note (Signed)
 Patient is presenting with fever, tachycardia, and hypoxia in the setting of community-acquired pneumonia.  Heart rate has responded well to IV fluids.  No evidence of shock at this time.  - S/p 2 L bolus.  Continue maintenance fluids - S/p vancomycin , Flagyl, cefepime , doxycycline  - Blood and urine cultures pending - Telemetry monitoring

## 2023-06-28 NOTE — ED Triage Notes (Signed)
 Pt bib ems from UC with reports of being dx on thurs with golfball sized kidney stone. 5/10 pain. Endorses bloody urine. Some shob. Temp 101F. Given 1g IV tylenol .  163/91 HR 90 95% RA CBG 200 12 lead unremarkable

## 2023-06-28 NOTE — ED Notes (Signed)
 Patient is being discharged from the Urgent Care and sent to the Emergency Department via EMS . Per Ruthann Cover, [place], patient is in need of higher level of care due to Possible Sepsis. Patient is aware and verbalizes understanding of plan of care.  Vitals:   06/28/23 0849  BP: (!) 167/81  Pulse: (!) 115  Temp: (!) 101 F (38.3 C)  SpO2: (!) 89%

## 2023-06-28 NOTE — ED Triage Notes (Addendum)
 Pt c/o cough, vomiting x3days  Pt wife states that he has a kidney stone the size of a golfball and he needs emergency surgery

## 2023-06-28 NOTE — Assessment & Plan Note (Signed)
-   A1c pending - Hold home regimen - SSI, moderate

## 2023-06-28 NOTE — Assessment & Plan Note (Signed)
 Patient recently established with urology due to painless hematuria, at which time it was found that he has 2 very large kidney stones on the right and the left.  He is asymptomatic at this time with no CVA tenderness or abdominal pain.  Urinalysis with hematuria but no leukocytes, nitrites or bacteria.  EDP discussed with urology, who is recommending urine culture.  - Urine culture pending - If positive, plan to reconsult urology

## 2023-06-28 NOTE — ED Notes (Signed)
 CCMD notified of pt.

## 2023-06-28 NOTE — ED Notes (Signed)
 Fluids stopped at this time per Dr. Peggi Bowels.

## 2023-06-28 NOTE — ED Notes (Signed)
 Fluids restarted per Dr. Peggi Bowels.

## 2023-06-28 NOTE — Assessment & Plan Note (Signed)
 Patient denies any chest pain at this time.  Troponin negative x 2.  - Continue home regimen

## 2023-06-29 DIAGNOSIS — A419 Sepsis, unspecified organism: Secondary | ICD-10-CM | POA: Diagnosis not present

## 2023-06-29 DIAGNOSIS — J9601 Acute respiratory failure with hypoxia: Secondary | ICD-10-CM | POA: Diagnosis not present

## 2023-06-29 DIAGNOSIS — R6520 Severe sepsis without septic shock: Secondary | ICD-10-CM | POA: Diagnosis not present

## 2023-06-29 LAB — CBC
HCT: 40.9 % (ref 39.0–52.0)
Hemoglobin: 14.3 g/dL (ref 13.0–17.0)
MCH: 33.9 pg (ref 26.0–34.0)
MCHC: 35 g/dL (ref 30.0–36.0)
MCV: 96.9 fL (ref 80.0–100.0)
Platelets: 143 10*3/uL — ABNORMAL LOW (ref 150–400)
RBC: 4.22 MIL/uL (ref 4.22–5.81)
RDW: 14.2 % (ref 11.5–15.5)
WBC: 7.4 10*3/uL (ref 4.0–10.5)
nRBC: 0 % (ref 0.0–0.2)

## 2023-06-29 LAB — URINE CULTURE: Culture: NO GROWTH

## 2023-06-29 LAB — GLUCOSE, CAPILLARY
Glucose-Capillary: 116 mg/dL — ABNORMAL HIGH (ref 70–99)
Glucose-Capillary: 225 mg/dL — ABNORMAL HIGH (ref 70–99)
Glucose-Capillary: 173 mg/dL — ABNORMAL HIGH (ref 70–99)
Glucose-Capillary: 349 mg/dL — ABNORMAL HIGH (ref 70–99)

## 2023-06-29 LAB — BASIC METABOLIC PANEL WITH GFR
Anion gap: 12 (ref 5–15)
BUN: 15 mg/dL (ref 8–23)
CO2: 23 mmol/L (ref 22–32)
Calcium: 8.1 mg/dL — ABNORMAL LOW (ref 8.9–10.3)
Chloride: 102 mmol/L (ref 98–111)
Creatinine, Ser: 0.79 mg/dL (ref 0.61–1.24)
GFR, Estimated: >60 mL/min (ref >60)
Glucose, Bld: 127 mg/dL — ABNORMAL HIGH (ref 70–99)
Potassium: 3.5 mmol/L (ref 3.5–5.1)
Sodium: 137 mmol/L (ref 135–145)

## 2023-06-29 MED ORDER — IPRATROPIUM-ALBUTEROL 0.5-2.5 (3) MG/3ML IN SOLN
3.0000 mL | Freq: Four times a day (QID) | RESPIRATORY_TRACT | Status: DC | PRN
  Administered 2023-06-29 (×2): 3 mL via RESPIRATORY_TRACT
  Filled 2023-06-29 (×2): qty 3

## 2023-06-29 MED ORDER — INSULIN ASPART 100 UNIT/ML IJ SOLN: 0.0000 [IU] | Freq: Every day | INTRAMUSCULAR | Status: DC

## 2023-06-29 MED ORDER — BUTALBITAL-APAP-CAFFEINE 50-325-40 MG PO TABS
2.0000 | ORAL_TABLET | Freq: Once | ORAL | Status: AC
  Administered 2023-06-29: 2 via ORAL
  Filled 2023-06-29: qty 2

## 2023-06-29 MED ORDER — INSULIN ASPART 100 UNIT/ML IJ SOLN
0.0000 [IU] | Freq: Three times a day (TID) | INTRAMUSCULAR | Status: DC
  Administered 2023-06-30: 5 [IU] via SUBCUTANEOUS
  Administered 2023-06-30: 3 [IU] via SUBCUTANEOUS
  Filled 2023-06-29 (×2): qty 1

## 2023-06-29 MED ORDER — ACETAMINOPHEN 500 MG PO TABS: 1000.0000 mg | ORAL_TABLET | Freq: Three times a day (TID) | ORAL | Status: DC | PRN

## 2023-06-29 NOTE — Progress Notes (Signed)
  PROGRESS NOTE    Kevin Perry  GNF:[id number] DOB: 1950-05-18 DOA: 06/28/2023 PCP: Raina Bunting, DO  210A/210A-AA  LOS: 1 day   Brief Perry course:   Assessment & Plan: Kevin Perry is a 73 y.o. male with medical history significant of CAD s/p CABG (2021), hypertension, and type 2 diabetes, anxiety, HFpEF, vitamin B12 deficiency, BPH, who presents to the ED due to fever and malaise.   [name] states that on 06/26/2023, he developed rapidly progressive fevers, nausea, vomiting, nonproductive cough and shortness of breath.   * Sepsis (HCC) Patient is presenting with fever, tachycardia, and hypoxia in the setting of community-acquired pneumonia.    CAP (community acquired pneumonia) Patient reports shortness of breath and dry cough, found to have a right lower lobe pneumonia on CT imaging. --cont ceftriaxone and azithro  Acute hypoxic respiratory failure (HCC) In the setting of community-acquired pneumonia.  O2 sats 89% on presentation, placed on 2L O2. --Continue supplemental O2 to keep sats >=92%, wean as tolerated  Bilateral nephrolithiasis Patient recently established with urology due to painless hematuria, at which time it was found that he has 2 very large kidney stones on the right and the left.  He is asymptomatic at this time with no CVA tenderness or abdominal pain.  Urinalysis with hematuria but no leukocytes, nitrites or bacteria.  EDP discussed with urology, who is recommending urine culture. - Urine culture pending - If positive, plan to reconsult urology  Paroxysmal atrial fibrillation [facility]) Has a history of postoperative atrial fibrillation with negative Holter monitor.  He is no longer on anticoagulation.  Sinus rhythm since arrival to the ED  Coronary artery disease involving native coronary artery of native heart without angina pectoris Patient denies any chest pain at this time.  Troponin negative x 2. --cont ASA  Type 2 diabetes  mellitus with other specified complication (HCC) With hyperglycemia --A1c 6.5 --ACHS and SSI  Essential hypertension Hypertensive since arrival in the ED. --cont amlodipine , Lisinopril , Lopressor    DVT prophylaxis: Lovenox  SQ Code Status: Full code  Family Communication:  Level of care: Telemetry Medical Dispo:   The patient is from: home Anticipated d/c is to: home Anticipated d/c date is: 2-3 days   Subjective and Interval History:  Pt reported feeling much better.  Coughed up some sputum.  RN reported pt desat with walking.   Objective: Vitals:   06/28/23 2243 06/29/23 0500 06/29/23 0735 06/29/23 1633  BP: (!) 160/82 (!) 158/62 (!) 144/69 120/75  Pulse:  72 72 77  Resp:   20 18  Temp:  98.9 F (37.2 C) 98.3 F (36.8 C) 98.5 F (36.9 C)  TempSrc:  Oral Oral Oral  SpO2:  98% 95% 94%  Weight:        Intake/Output Summary (Last 24 hours) at 06/29/2023 1802 Last data filed at 06/29/2023 1508 Gross per 24 hour  Intake 1060 ml  Output 1000 ml  Net 60 ml   Filed Weights   06/28/23 0947  Weight: 117.9 kg    Examination:   Constitutional: NAD, AAOx3 HEENT: conjunctivae and lids normal, EOMI CV: No cyanosis.   RESP: normal respiratory effort, on 2L Neuro: II - XII grossly intact.   Psych: Normal mood and affect.  Appropriate judgement and reason   Data Reviewed: I have personally reviewed labs and imaging studies  Time spent: 50 minutes  Garrison Kanner, MD [facility] If 7PM-7AM, please contact night-coverage 06/29/2023, 6:02 PM

## 2023-06-29 NOTE — Progress Notes (Signed)
 SATURATION QUALIFICATIONS: (This note is used to comply with regulatory documentation for home oxygen)  Patient Saturations on Room Air at Rest = 89%  Patient Saturations on Room Air while Ambulating = 89-90%  Patient Saturations on 2 Liters of oxygen while Ambulating = 93%  Please briefly explain why patient needs home oxygen:  Pt walked 1 lap around the unit with this RN. O2 sats dropped to 89% when ambulating on RA. No symptoms reported per pt.

## 2023-06-29 NOTE — Progress Notes (Signed)
 Mobility Specialist - Progress Note   06/29/23 0838  Mobility  Activity Ambulated with assistance in hallway;Stood at bedside;Dangled on edge of bed  Level of Assistance Standby assist, set-up cues, supervision of patient - no hands on  Assistive Device Front wheel walker  Distance Ambulated (ft) 160 ft  Activity Response Tolerated well  Mobility Referral Yes  Mobility visit 1 Mobility  Mobility Specialist Start Time (ACUTE ONLY) 0817  Mobility Specialist Stop Time (ACUTE ONLY) 0831  Mobility Specialist Time Calculation (min) (ACUTE ONLY) 14 min   Pt semi-supine on 2L upon arrival. Pt STS and ambulates 1 lap around NS Supervision. Pt returns to recliner with needs in reach, chair alarm activated, and wife present.   Wash Hack  Mobility Specialist  06/29/23 8:39 AM

## 2023-06-29 NOTE — Progress Notes (Signed)
 Mobility Specialist - Progress Note   06/29/23 1423  Mobility  Activity Ambulated independently in hallway  Level of Assistance Independent  Assistive Device None  Distance Ambulated (ft) 480 ft  Activity Response Tolerated well  Mobility Referral Yes  Mobility visit 1 Mobility  Mobility Specialist Start Time (ACUTE ONLY) 1345  Mobility Specialist Stop Time (ACUTE ONLY) 1401  Mobility Specialist Time Calculation (min) (ACUTE ONLY) 16 min   Pt sitting in recliner on 2L upon arrival. Pt STS and ambulates in hallway indep. Pt returns to recliner with needs in reach.  Wash Hack  Mobility Specialist  06/29/23 2:24 PM

## 2023-06-29 NOTE — Plan of Care (Signed)

## 2023-06-30 DIAGNOSIS — J9601 Acute respiratory failure with hypoxia: Secondary | ICD-10-CM | POA: Diagnosis not present

## 2023-06-30 DIAGNOSIS — A419 Sepsis, unspecified organism: Secondary | ICD-10-CM | POA: Diagnosis not present

## 2023-06-30 DIAGNOSIS — R6520 Severe sepsis without septic shock: Secondary | ICD-10-CM | POA: Diagnosis not present

## 2023-06-30 LAB — BASIC METABOLIC PANEL WITH GFR
Anion gap: 10 (ref 5–15)
BUN: 16 mg/dL (ref 8–23)
CO2: 21 mmol/L — ABNORMAL LOW (ref 22–32)
Calcium: 8.2 mg/dL — ABNORMAL LOW (ref 8.9–10.3)
Chloride: 100 mmol/L (ref 98–111)
Creatinine, Ser: 0.72 mg/dL (ref 0.61–1.24)
GFR, Estimated: >60 mL/min (ref >60)
Glucose, Bld: 196 mg/dL — ABNORMAL HIGH (ref 70–99)
Potassium: 3.3 mmol/L — ABNORMAL LOW (ref 3.5–5.1)
Sodium: 131 mmol/L — ABNORMAL LOW (ref 135–145)

## 2023-06-30 LAB — LEGIONELLA PNEUMOPHILA SEROGP 1 UR AG: L. pneumophila Serogp 1 Ur Ag: NEGATIVE

## 2023-06-30 LAB — GLUCOSE, CAPILLARY
Glucose-Capillary: 186 mg/dL — ABNORMAL HIGH (ref 70–99)
Glucose-Capillary: 196 mg/dL — ABNORMAL HIGH (ref 70–99)
Glucose-Capillary: 237 mg/dL — ABNORMAL HIGH (ref 70–99)
Glucose-Capillary: 299 mg/dL — ABNORMAL HIGH (ref 70–99)

## 2023-06-30 LAB — MAGNESIUM: Magnesium: 2.2 mg/dL (ref 1.7–2.4)

## 2023-06-30 MED ORDER — POTASSIUM CHLORIDE CRYS ER 20 MEQ PO TBCR
40.0000 meq | EXTENDED_RELEASE_TABLET | Freq: Once | ORAL | Status: AC
  Administered 2023-06-30: 40 meq via ORAL
  Filled 2023-06-30: qty 2

## 2023-06-30 MED ORDER — AZITHROMYCIN 250 MG PO TABS: 500.0000 mg | ORAL_TABLET | ORAL | Status: DC

## 2023-06-30 MED ORDER — LEVOFLOXACIN 750 MG PO TABS: 750.0000 mg | ORAL_TABLET | Freq: Every day | ORAL | 0 refills | Status: DC

## 2023-06-30 NOTE — TOC CM/SW Note (Signed)
 Transition of Care [facility]) - Inpatient Brief Assessment   Patient Details  Name: ZAIDE KARDELL MRN: [id number] Date of Birth: 12/11/1950  Transition of Care [facility]) CM/SW Contact:    Loman Risk, RN Phone Number: 06/30/2023, 2:47 PM   Clinical Narrative:   Transition of Care [facility]) Screening Note   Patient Details  Name: KRISTA SOM Date of Birth: Apr 26, 1950   Transition of Care [facility]) CM/SW Contact:    Loman Risk, RN Phone Number: 06/30/2023, 2:48 PM    Transition of Care Department [facility]) has reviewed patient and no TOC needs have been identified at this time.  If new patient transition needs arise, please place a TOC consult.  Patient currently on 2L o2, however yesterday sats documented did not qualify for home o2    Transition of Care Asessment: Insurance and Status: Insurance coverage has been reviewed Patient has primary care physician: Yes     Prior/Current Home Services: No current home services Social Drivers of Health Review: SDOH reviewed no interventions necessary Readmission risk has been reviewed: Yes Transition of care needs: no transition of care needs at this time

## 2023-06-30 NOTE — Discharge Summary (Signed)
 Physician AMA Discharge Summary   Kevin Perry  male DOB: Mar 25, 1950  ZOX:[id number]  PCP: Raina Bunting, DO  Admit date: 06/28/2023 Discharge date: ***  Admitted From: *** Disposition:  {disposition:18248} Home Health: {yes/no:20286} CODE STATUS: {Palliative Code status:23503}   Hospital Course:  For full details, please see H&P, progress notes, consult notes and ancillary notes.  Briefly,  ***  2 days of levaquin   Unknown if still need O2 with walking  Unless noted above, medications under "STOP" list are ones pt was not taking PTA.  Discharge Diagnoses:  Principal Problem:   Sepsis (HCC) Active Problems:   CAP (community acquired pneumonia)   Bilateral nephrolithiasis   Acute hypoxic respiratory failure (HCC)   Essential hypertension   Type 2 diabetes mellitus with other specified complication (HCC)   Coronary artery disease involving native coronary artery of native heart without angina pectoris   Paroxysmal atrial fibrillation (HCC)   30 Day Unplanned Readmission Risk Score    Flowsheet Row ED to Hosp-Admission (Discharged) from 06/28/2023 in [facility]  30 Day Unplanned Readmission Risk Score (%) 12.46 Filed at 06/30/2023 1600       This score is the patient's risk of an unplanned readmission within 30 days of being discharged (0 -100%). The score is based on dignosis, age, lab data, medications, orders, and past utilization.   Low:  0-14.9   Medium: 15-21.9   High: 22-29.9   Extreme: 30 and above         Discharge Instructions:  Allergies as of 06/30/2023       Reactions   Naproxen Swelling, Anaphylaxis   Rosuvastatin  Other (See Comments)     Med Rec must be completed prior to using this SMARTLINK***         Allergies  Allergen Reactions   Naproxen Swelling and Anaphylaxis   Rosuvastatin  Other (See Comments)     The results of significant diagnostics from this hospitalization  (including imaging, microbiology, ancillary and laboratory) are listed below for reference.   Consultations:   Procedures/Studies: CT Angio Chest PE W and/or Wo Contrast Result Date: 06/28/2023 CLINICAL DATA:  Hematuria and renal calculi.  Shortness of breath. EXAM: CT ANGIOGRAPHY CHEST CT ABDOMEN AND PELVIS WITH CONTRAST TECHNIQUE: Multidetector CT imaging of the chest was performed using the standard protocol during bolus administration of intravenous contrast. Multiplanar CT image reconstructions and MIPs were obtained to evaluate the vascular anatomy. Multidetector CT imaging of the abdomen and pelvis was performed using the standard protocol during bolus administration of intravenous contrast. RADIATION DOSE REDUCTION: This exam was performed according to the departmental dose-optimization program which includes automated exposure control, adjustment of the mA and/or kV according to patient size and/or use of iterative reconstruction technique. CONTRAST:  75mL OMNIPAQUE  IOHEXOL  350 MG/ML SOLN COMPARISON:  Multiple exams, including 06/19/2023 and chest radiograph 06/28/2023 FINDINGS: Despite efforts by the technologist and patient, motion artifact is present on today's exam and could not be eliminated. This reduces exam sensitivity and specificity. CTA CHEST FINDINGS Cardiovascular: No filling defect is identified in the pulmonary arterial tree to suggest pulmonary embolus. Coronary, aortic arch, and branch vessel atherosclerotic vascular disease. Prior CABG. Mild to moderate cardiomegaly. Mediastinum/Nodes: 1.0 cm right lower paratracheal node on image 59 series 2, likely inflammatory/reactive. Similarly there is an enlarged right infrahilar lymph node measuring 1.3 cm in short axis on image 91 series 2 which is likely reactive adenopathy given other findings in the right lower lobe. A left  lower lobe lymph node is upper normal sized at 0.8 cm short axis. Lungs/Pleura: Emphysema. Airspace opacity in the  right lower lobe, a component of which is hazy and ground-glass compatible with a component of alveolitis. This area was clear on 06/19/2023 and accordingly is most compatible with pneumonia. Musculoskeletal: Lower thoracic spondylosis. Review of the MIP images confirms the above findings. CT ABDOMEN and PELVIS FINDINGS Hepatobiliary: Cholecystectomy.  Otherwise unremarkable. Pancreas: Unremarkable Spleen: Unremarkable Adrenals/Urinary Tract: Both adrenal glands appear normal. Persistent 1.7 cm calculus in the right renal pelvis with surrounding inflammatory stranding in the renal pelvis adipose tissue. Borderline fullness of a posterior left kidney upper pole calyx similar to previous without overt hydronephrosis. Likewise stable nonobstructive 1.2 cm right kidney lower pole calculus. No compelling findings of pyelonephritis. Urinary bladder unremarkable. Stomach/Bowel: Sigmoid colon diverticulosis. Vascular/Lymphatic: Atherosclerosis is present, including aortoiliac atherosclerotic disease. Atheromatous plaque proximally in the superior mesenteric artery and along the origin of the celiac trunk, without overt occlusion. Reproductive: Unremarkable Other: No supplemental non-categorized findings. Musculoskeletal: Grade 1 degenerative anterolisthesis at L4-5. Spondylosis, degenerative disc disease, and congenitally short pedicles cause multilevel lumbar impingement especially at L3-4, L4-5, and L5-S1. Review of the MIP images confirms the above findings. IMPRESSION: 1. No filling defect is identified in the pulmonary arterial tree to suggest pulmonary embolus. 2. Right lower lobe pneumonia with a component of alveolitis. 3. Persistent 1.7 cm calculus in the right renal pelvis with surrounding inflammatory stranding in the renal pelvis adipose tissue. No overt hydronephrosis. 4. Stable nonobstructive 1.2 cm right kidney lower pole calculus. 5. Sigmoid colon diverticulosis. 6. Multilevel lumbar impingement. 7. Aortic  Atherosclerosis (ICD10-I70.0) and Emphysema (ICD10-J43.9). Electronically Signed   By: Freida Jes M.D.   On: 06/28/2023 12:54   CT ABDOMEN PELVIS W CONTRAST Result Date: 06/28/2023 CLINICAL DATA:  Hematuria and renal calculi.  Shortness of breath. EXAM: CT ANGIOGRAPHY CHEST CT ABDOMEN AND PELVIS WITH CONTRAST TECHNIQUE: Multidetector CT imaging of the chest was performed using the standard protocol during bolus administration of intravenous contrast. Multiplanar CT image reconstructions and MIPs were obtained to evaluate the vascular anatomy. Multidetector CT imaging of the abdomen and pelvis was performed using the standard protocol during bolus administration of intravenous contrast. RADIATION DOSE REDUCTION: This exam was performed according to the departmental dose-optimization program which includes automated exposure control, adjustment of the mA and/or kV according to patient size and/or use of iterative reconstruction technique. CONTRAST:  75mL OMNIPAQUE  IOHEXOL  350 MG/ML SOLN COMPARISON:  Multiple exams, including 06/19/2023 and chest radiograph 06/28/2023 FINDINGS: Despite efforts by the technologist and patient, motion artifact is present on today's exam and could not be eliminated. This reduces exam sensitivity and specificity. CTA CHEST FINDINGS Cardiovascular: No filling defect is identified in the pulmonary arterial tree to suggest pulmonary embolus. Coronary, aortic arch, and branch vessel atherosclerotic vascular disease. Prior CABG. Mild to moderate cardiomegaly. Mediastinum/Nodes: 1.0 cm right lower paratracheal node on image 59 series 2, likely inflammatory/reactive. Similarly there is an enlarged right infrahilar lymph node measuring 1.3 cm in short axis on image 91 series 2 which is likely reactive adenopathy given other findings in the right lower lobe. A left lower lobe lymph node is upper normal sized at 0.8 cm short axis. Lungs/Pleura: Emphysema. Airspace opacity in the right  lower lobe, a component of which is hazy and ground-glass compatible with a component of alveolitis. This area was clear on 06/19/2023 and accordingly is most compatible with pneumonia. Musculoskeletal: Lower thoracic spondylosis. Review of the MIP images  confirms the above findings. CT ABDOMEN and PELVIS FINDINGS Hepatobiliary: Cholecystectomy.  Otherwise unremarkable. Pancreas: Unremarkable Spleen: Unremarkable Adrenals/Urinary Tract: Both adrenal glands appear normal. Persistent 1.7 cm calculus in the right renal pelvis with surrounding inflammatory stranding in the renal pelvis adipose tissue. Borderline fullness of a posterior left kidney upper pole calyx similar to previous without overt hydronephrosis. Likewise stable nonobstructive 1.2 cm right kidney lower pole calculus. No compelling findings of pyelonephritis. Urinary bladder unremarkable. Stomach/Bowel: Sigmoid colon diverticulosis. Vascular/Lymphatic: Atherosclerosis is present, including aortoiliac atherosclerotic disease. Atheromatous plaque proximally in the superior mesenteric artery and along the origin of the celiac trunk, without overt occlusion. Reproductive: Unremarkable Other: No supplemental non-categorized findings. Musculoskeletal: Grade 1 degenerative anterolisthesis at L4-5. Spondylosis, degenerative disc disease, and congenitally short pedicles cause multilevel lumbar impingement especially at L3-4, L4-5, and L5-S1. Review of the MIP images confirms the above findings. IMPRESSION: 1. No filling defect is identified in the pulmonary arterial tree to suggest pulmonary embolus. 2. Right lower lobe pneumonia with a component of alveolitis. 3. Persistent 1.7 cm calculus in the right renal pelvis with surrounding inflammatory stranding in the renal pelvis adipose tissue. No overt hydronephrosis. 4. Stable nonobstructive 1.2 cm right kidney lower pole calculus. 5. Sigmoid colon diverticulosis. 6. Multilevel lumbar impingement. 7. Aortic  Atherosclerosis (ICD10-I70.0) and Emphysema (ICD10-J43.9). Electronically Signed   By: Freida Jes M.D.   On: 06/28/2023 12:54   DG Chest Port 1 View Result Date: 06/28/2023 CLINICAL DATA:  Questions sepsis.  Fever and shortness of breath. EXAM: PORTABLE CHEST 1 VIEW COMPARISON:  11/21/2016 FINDINGS: Previous median sternotomy for CABG procedure. Mild cardiac enlargement. No pleural fluid or interstitial edema. Increase opacity within the right lower lung is identified which may reflect atelectasis or pneumonia. Left lung appears clear. IMPRESSION: Right lower lung opacity compatible with atelectasis or pneumonia. Electronically Signed   By: Kimberley Penman M.D.   On: 06/28/2023 10:29   CT ABDOMEN PELVIS W WO CONTRAST Result Date: 06/19/2023 CLINICAL DATA:  Painless microhematuria. EXAM: CT ABDOMEN AND PELVIS WITHOUT AND WITH CONTRAST TECHNIQUE: Multidetector CT imaging of the abdomen and pelvis was performed following the standard protocol before and following the bolus administration of intravenous contrast. RADIATION DOSE REDUCTION: This exam was performed according to the departmental dose-optimization program which includes automated exposure control, adjustment of the mA and/or kV according to patient size and/or use of iterative reconstruction technique. CONTRAST:  100mL ISOVUE -300 IOPAMIDOL  (ISOVUE -300) INJECTION 61% COMPARISON:  CT scan abdomen and pelvis from 05/30/2022. FINDINGS: Lower chest: The lung bases are clear. No pleural effusion. The heart is normal in size. No pericardial effusion. Hepatobiliary: The liver is normal in size. Non-cirrhotic configuration. No suspicious mass. No intrahepatic or extrahepatic bile duct dilation. Gallbladder is surgically absent. Pancreas: Unremarkable. No pancreatic ductal dilatation or surrounding inflammatory changes. Spleen: Within normal limits. No focal lesion. Adrenals/Urinary Tract: Adrenal glands are unremarkable. No suspicious renal mass. No left  hydroureteronephrosis or nephroureterolithiasis. There is a 1.4 x 1.9 cm right renal pelvic calculus. There is a small branching calculus in the right kidney lower pole calyx with larger component measuring up to 7 x 12 mm. There is associated right urothelial thickening and mild fat stranding surrounding the renal pelvis. There is mild fullness in the right renal collecting system. Right ureter is nondilated. No right ureterolithiasis. Findings are essentially unchanged since the prior study. Urinary bladder is under distended, precluding optimal assessment. However, no large mass or stones identified. No perivesical fat stranding. Stomach/Bowel: No disproportionate dilation of  the small or large bowel loops. No evidence of abnormal bowel wall thickening or inflammatory changes. The appendix is unremarkable. There are multiple diverticula mainly in the left hemi colon, without imaging signs of diverticulitis. Vascular/Lymphatic: No ascites or pneumoperitoneum. No abdominal or pelvic lymphadenopathy, by size criteria. No aneurysmal dilation of the major abdominal arteries. There are moderate peripheral atherosclerotic vascular calcifications of the aorta and its major branches. Reproductive: Normal size prostate. Symmetric seminal vesicles. Other: There are fat containing umbilical and bilateral inguinal hernias. The soft tissues and abdominal wall are otherwise unremarkable. Musculoskeletal: No suspicious osseous lesions. There are mild - moderate multilevel degenerative changes in the visualized spine. IMPRESSION: 1. There is a 1.4 x 1.9 cm right renal pelvic calculus with associated mild fullness in the right renal collecting system and urothelial thickening and mild fat stranding surrounding the renal pelvis. There is also a small branching calculus in the right kidney lower pole calyx. Findings are essentially unchanged since the prior study from 05/30/2022. No left hydroureteronephrosis or  nephroureterolithiasis. 2. Multiple other nonacute observations, as described above. Aortic Atherosclerosis (ICD10-I70.0). Electronically Signed   By: Beula Brunswick M.D.   On: 06/19/2023 14:39      Labs: BNP (last 3 results) Recent Labs    06/28/23 0944  BNP 91.6   Basic Metabolic Panel: Recent Labs  Lab 06/28/23 0944 06/28/23 1306 06/29/23 0504 06/30/23 0501  NA 136 136 137 131*  K 3.5 3.7 3.5 3.3*  CL 101 101 102 100  CO2 18* 23 23 21*  GLUCOSE 192* 183* 127* 196*  BUN 16 18 15 16   CREATININE 0.94 0.93 0.79 0.72  CALCIUM  8.4* 8.4* 8.1* 8.2*  MG  --   --   --  2.2   Liver Function Tests: Recent Labs  Lab 06/28/23 0944  AST 17  ALT 19  ALKPHOS 36*  BILITOT 2.8*  PROT 6.8  ALBUMIN 3.8   No results for input(s): "LIPASE", "AMYLASE" in the last 168 hours. No results for input(s): "AMMONIA" in the last 168 hours. CBC: Recent Labs  Lab 06/28/23 0944 06/29/23 0504  WBC 9.3 7.4  NEUTROABS 7.8*  --   HGB 16.3 14.3  HCT 46.4 40.9  MCV 97.3 96.9  PLT 160 143*   Cardiac Enzymes: No results for input(s): "CKTOTAL", "CKMB", "CKMBINDEX", "TROPONINI" in the last 168 hours. BNP: Invalid input(s): "POCBNP" CBG: Recent Labs  Lab 06/29/23 2123 06/30/23 0028 06/30/23 0816 06/30/23 1124 06/30/23 1620  GLUCAP 349* 186* 196* 237* 299*   D-Dimer No results for input(s): "DDIMER" in the last 72 hours. Hgb A1c Recent Labs    06/28/23 1600  HGBA1C 6.5*   Lipid Profile No results for input(s): "CHOL", "HDL", "LDLCALC", "TRIG", "CHOLHDL", "LDLDIRECT" in the last 72 hours. Thyroid  function studies No results for input(s): "TSH", "T4TOTAL", "T3FREE", "THYROIDAB" in the last 72 hours.  Invalid input(s): "FREET3" Anemia work up No results for input(s): "VITAMINB12", "FOLATE", "FERRITIN", "TIBC", "IRON", "RETICCTPCT" in the last 72 hours. Urinalysis    Component Value Date/Time   COLORURINE YELLOW (A) 06/28/2023 0943   APPEARANCEUR CLEAR (A) 06/28/2023 0943    LABSPEC 1.030 06/28/2023 0943   PHURINE 5.0 06/28/2023 0943   GLUCOSEU >=500 (A) 06/28/2023 0943   HGBUR MODERATE (A) 06/28/2023 0943   BILIRUBINUR NEGATIVE 06/28/2023 0943   KETONESUR 80 (A) 06/28/2023 0943   PROTEINUR 30 (A) 06/28/2023 0943   NITRITE NEGATIVE 06/28/2023 0943   LEUKOCYTESUR NEGATIVE 06/28/2023 0943   Sepsis Labs Recent Labs  Lab 06/28/23  4782 06/29/23 0504  WBC 9.3 7.4   Microbiology Recent Results (from the past 240 hours)  Blood Culture (routine x 2)     Status: None (Preliminary result)   Collection Time: 06/28/23  9:43 AM   Specimen: Left Antecubital; Blood  Result Value Ref Range Status   Specimen Description LEFT ANTECUBITAL  Final   Special Requests   Final    BOTTLES DRAWN AEROBIC AND ANAEROBIC Blood Culture adequate volume   Culture   Final    NO GROWTH 2 DAYS Performed at [facility], [address] [place], [place] [address]    Report Status PENDING  Incomplete  Urine Culture     Status: None   Collection Time: 06/28/23  9:43 AM   Specimen: Urine, Random  Result Value Ref Range Status   Specimen Description   Final    URINE, RANDOM Performed at [facility], [address] [place], [place] [address]    Special Requests   Final    NONE Reflexed from [phone] Performed at [facility], [address] [place], [place] [address]    Culture   Final    NO GROWTH Performed at [facility], [address], [place], [place] [address]    Report Status 06/29/2023 FINAL  Final  Resp panel by RT-PCR (RSV, Flu A&B, Covid) Anterior Nasal Swab     Status: None   Collection Time: 06/28/23  9:44 AM   Specimen: Anterior Nasal Swab  Result Value Ref Range Status   SARS Coronavirus 2 by RT PCR NEGATIVE NEGATIVE Final    Comment: (NOTE) SARS-CoV-2 target nucleic acids are NOT DETECTED.  The SARS-CoV-2 RNA is generally detectable in upper respiratory specimens during the acute phase of infection. The  lowest concentration of SARS-CoV-2 viral copies this assay can detect is 138 copies/mL. A negative result does not preclude SARS-Cov-2 infection and should not be used as the sole basis for treatment or other patient management decisions. A negative result may occur with  improper specimen collection/handling, submission of specimen other than nasopharyngeal swab, presence of viral mutation(s) within the areas targeted by this assay, and inadequate number of viral copies(<138 copies/mL). A negative result must be combined with clinical observations, patient history, and epidemiological information. The expected result is Negative.  Fact Sheet for Patients:  BloggerCourse.com  Fact Sheet for Healthcare Providers:  SeriousBroker.it  This test is no t yet approved or cleared by the [place]  FDA and  has been authorized for detection and/or diagnosis of SARS-CoV-2 by FDA under an Emergency Use Authorization (EUA). This EUA will remain  in effect (meaning this test can be used) for the duration of the COVID-19 declaration under Section 564(b)(1) of the Act, 21 U.S.C.section 360bbb-3(b)(1), unless the authorization is terminated  or revoked sooner.       Influenza A by PCR NEGATIVE NEGATIVE Final   Influenza B by PCR NEGATIVE NEGATIVE Final    Comment: (NOTE) The Xpert Xpress SARS-CoV-2/FLU/RSV plus assay is intended as an aid in the diagnosis of influenza from Nasopharyngeal swab specimens and should not be used as a sole basis for treatment. Nasal washings and aspirates are unacceptable for Xpert Xpress SARS-CoV-2/FLU/RSV testing.  Fact Sheet for Patients: BloggerCourse.com  Fact Sheet for Healthcare Providers: SeriousBroker.it  This test is not yet approved or cleared by the [place]  FDA and has been authorized for detection and/or diagnosis of SARS-CoV-2 by FDA under  an Emergency Use Authorization (EUA). This EUA will  remain in effect (meaning this test can be used) for the duration of the COVID-19 declaration under Section 564(b)(1) of the Act, 21 U.S.C. section 360bbb-3(b)(1), unless the authorization is terminated or revoked.     Resp Syncytial Virus by PCR NEGATIVE NEGATIVE Final    Comment: (NOTE) Fact Sheet for Patients: BloggerCourse.com  Fact Sheet for Healthcare Providers: SeriousBroker.it  This test is not yet approved or cleared by the [place]  FDA and has been authorized for detection and/or diagnosis of SARS-CoV-2 by FDA under an Emergency Use Authorization (EUA). This EUA will remain in effect (meaning this test can be used) for the duration of the COVID-19 declaration under Section 564(b)(1) of the Act, 21 U.S.C. section 360bbb-3(b)(1), unless the authorization is terminated or revoked.  Performed at [facility], [address] [place], [place] [address]   Blood Culture (routine x 2)     Status: None (Preliminary result)   Collection Time: 06/28/23  9:44 AM   Specimen: Right Antecubital; Blood  Result Value Ref Range Status   Specimen Description RIGHT ANTECUBITAL  Final   Special Requests   Final    BOTTLES DRAWN AEROBIC AND ANAEROBIC Blood Culture adequate volume   Culture   Final    NO GROWTH 2 DAYS Performed at [facility], [address] [place], [place] [address]    Report Status PENDING  Incomplete     Total time spend on discharging this patient, including the last patient exam, discussing the hospital stay, instructions for ongoing care as it relates to all pertinent caregivers, as well as preparing the medical discharge records, prescriptions, and/or referrals as applicable, is *** minutes.    Garrison Kanner, MD  [facility] 06/30/2023, 7:05 PM

## 2023-06-30 NOTE — Progress Notes (Signed)
 PHARMACIST - PHYSICIAN COMMUNICATION DR:   Gordy Lauber CONCERNING: Antibiotic IV to Oral Route Change Policy  RECOMMENDATION: This patient is receiving Azithromycin  by the intravenous route.  Based on criteria approved by the Pharmacy and Therapeutics Committee, the antibiotic(s) is/are being converted to the equivalent oral dose form(s).   DESCRIPTION: These criteria include: Patient being treated for a respiratory tract infection, urinary tract infection, cellulitis or clostridium difficile associated diarrhea if on metronidazole The patient is not neutropenic and does not exhibit a GI malabsorption state The patient is eating (either orally or via tube) and/or has been taking other orally administered medications for a least 24 hours The patient is improving clinically and has a Tmax < 100.5  If you have questions about this conversion, please contact the Pharmacy Department  []   [phone] )  Cristine Done [x]   [phone] )  [facility] []   [phone] )  [facility] []   [phone] )  [facility] []   [phone] )  [facility]

## 2023-06-30 NOTE — Inpatient Diabetes Management (Signed)
 Inpatient Diabetes Program Recommendations  AACE/ADA: New Consensus Statement on Inpatient Glycemic Control (2015)  Target Ranges:  Prepandial:   less than 140 mg/dL      Peak postprandial:   less than 180 mg/dL (1-2 hours)      Critically ill patients:  140 - 180 mg/dL    Latest Reference Range & Units 06/29/23 07:32 06/29/23 11:43 06/29/23 16:36 06/29/23 21:23  Glucose-Capillary 70 - 99 mg/dL 865 (H) 784 (H)  5 units Novolog   173 (H)  3 units Novolog   349 (H)  (H): Data is abnormally high  Latest Reference Range & Units 06/30/23 08:16 06/30/23 11:24  Glucose-Capillary 70 - 99 mg/dL 696 (H)  3 units Novolog   237 (H)  (H): Data is abnormally high   Home DM Meds: Metformin  1000 AM/ 500 PM     Trulicity  3 mg Qweek     Jardiance 25 mg daily   Current Orders: Novolog  0-15 units TID ac/hs   MD- Note rise in CBGs in the afternoon Home DM Meds are on hold  Please consider starting Novolog  Meal Coverage: Novolog  3 units TID with meals HOLD if pt NPO HOLD if pt eats <50% meals    --Will follow patient during hospitalization--  Langston Pippins RN, MSN, CDCES Diabetes Coordinator Inpatient Glycemic Control Team Team Pager: [phone] (8a-5p)

## 2023-07-02 ENCOUNTER — Encounter: Payer: Self-pay | Admitting: Family Medicine

## 2023-07-02 DIAGNOSIS — N200 Calculus of kidney: Secondary | ICD-10-CM | POA: Diagnosis not present

## 2023-07-03 ENCOUNTER — Encounter: Payer: Self-pay | Admitting: Family Medicine

## 2023-07-03 ENCOUNTER — Ambulatory Visit (INDEPENDENT_AMBULATORY_CARE_PROVIDER_SITE_OTHER): Admitting: Family Medicine

## 2023-07-03 VITALS — BP 140/76 | HR 69 | Ht 74.0 in | Wt 250.0 lb

## 2023-07-03 DIAGNOSIS — J189 Pneumonia, unspecified organism: Secondary | ICD-10-CM

## 2023-07-03 LAB — CULTURE, BLOOD (ROUTINE X 2)
Culture: NO GROWTH
Culture: NO GROWTH
Special Requests: ADEQUATE
Special Requests: ADEQUATE

## 2023-07-03 MED ORDER — LEVOFLOXACIN 750 MG PO TABS: 750.0000 mg | ORAL_TABLET | Freq: Every day | ORAL | 0 refills | Status: AC

## 2023-07-03 NOTE — Telephone Encounter (Signed)
 Appointment made for today

## 2023-07-03 NOTE — Patient Instructions (Addendum)
 Thank you for coming to the office today.  I believe that the pneumonia will continue to resolve as you have been treated properly. Lungs are clear today  You are 24 hours off of antibiotic, I don't believe we need to extend antibiotic unless you have worsening symptoms.  If you develop any worsening shortness of breath, worsening cough, wheezing, fever, chills etc, you can go ahead and restart antibiotic added 3 more days as a back up plan only. Otherwise, if you are IMPROVING - do NOT take the extra days of antibiotic.  Please schedule a Follow-up Appointment to: Return if symptoms worsen or fail to improve.  If you have any other questions or concerns, please feel free to call the office or send a message through MyChart. You may also schedule an earlier appointment if necessary.  Additionally, you may be receiving a survey about your experience at our office within a few days to 1 week by e-mail or mail. We value your feedback.  Domingo Friend, DO [facility], [place]

## 2023-07-03 NOTE — Progress Notes (Signed)
 Subjective:    Patient ID: Kevin Perry, male    DOB: 29-Apr-1950, 73 y.o.   MRN: [id number]  Kevin Perry is a 73 y.o. male presenting on 07/03/2023 for Pneumonia [facility] Follow-up)   HPI  Discussed the use of AI scribe software for clinical note transcription with the patient, who gave verbal consent to proceed.  History of Present Illness   Kevin Perry is a 73 year old male who presents for a hospital follow-up after treatment for pneumonia.  HOSPITAL FOLLOW-UP VISIT  Hospital/Location: [facility] Date of Admission: 06/28/23 Date of Discharge: 06/30/23 Transitions of care telephone call: not completed  Reason for Admission: Pneumonia / Fever / Cough / Dyspnea / nausea vomiting   - Hospital H&P and Discharge Summary have been reviewed - Patient presents today 3 days after recent hospitalization.  He was treated for sinus issues a month ago with antibiotics, which resolved the condition. However, he became ill again last week, around Thursday, experiencing fever, fatigue, nausea, vomiting, shortness of breath, and abdominal pain. He visited urgent care on Jun 28, 2023, and was subsequently sent to the hospital.  He was hospitalized from May 24 to Jun 30, 2023, where he was diagnosed with pneumonia in the right lower lobe based on x-ray and CT scan. He received IV antibiotics, ceftriaxone, for three days and was discharged with a prescription for Levaquin  for 2 doses, which he finished about a day ago. During his hospital stay, he required oxygen but no longer needs it at home. His oxygen saturation is between 93 to 95%. No current fever, shortness of breath, or cough.  Additionally, he was informed of two large kidney stones during his hospital stay, which are not currently causing problems but will require surgical intervention in the future.   - Today reports overall has done well after discharge. Symptoms of pneumonia have resolved   - New medications on discharge:  Levaquin  750mg  x 2 doses - Changes to current meds on discharge: None  I have reviewed the discharge medication list, and have reconciled the current and discharge medications today.   Current Outpatient Medications:    amLODipine  (NORVASC ) 10 MG tablet, TAKE 1 TABLET BY MOUTH ONCE DAILY, Disp: 90 tablet, Rfl: 0   aspirin  EC 81 MG tablet, Take 81 mg by mouth daily. Swallow whole., Disp: , Rfl:    buPROPion  (WELLBUTRIN  XL) 300 MG 24 hr tablet, Take 300 mg by mouth daily., Disp: , Rfl:    empagliflozin (JARDIANCE) 25 MG TABS tablet, Take 1 tablet by mouth daily., Disp: , Rfl:    escitalopram  (LEXAPRO ) 20 MG tablet, TAKE 1 TABLET BY MOUTH ONCE DAILY AS DIRECTED TAPER AS DIRECTED. OFF ZOLOFT  AND ON TO ESCITALOPRAM , Disp: 90 tablet, Rfl: 0   Evolocumab  (REPATHA  SURECLICK) 140 MG/ML SOAJ, Inject 140 mg into the skin every 14 (fourteen) days., Disp: , Rfl:    folic acid  (FOLVITE ) 1 MG tablet, Take 1 mg by mouth See admin instructions. Take 1 mg daily except skip dose on Wednesdays (methotrexate day), Disp: , Rfl:    furosemide  (LASIX ) 40 MG tablet, TAKE 1 TABLET BY MOUTH ONCE DAILY, Disp: 90 tablet, Rfl: 1   gabapentin  (NEURONTIN ) 100 MG capsule, TAKE 1 CAPSULE BY MOUTH TWICE DAILY, Disp: 180 capsule, Rfl: 0   lisinopril  (ZESTRIL ) 20 MG tablet, Take 1 tablet by mouth 2 (two) times daily., Disp: , Rfl:    loratadine  (CLARITIN ) 10 MG tablet, Take 10 mg by mouth every morning., Disp: ,  Rfl:    metFORMIN  (GLUCOPHAGE ) 500 MG tablet, TAKE 2 TABLETS BY MOUTH ONCE EVERY MORNING WITH BREAKFAST AND 1 TABLET ONCEEVERY EVENING WITH DINNER, Disp: 270 tablet, Rfl: 0   methotrexate (RHEUMATREX) 2.5 MG tablet, Take 10 mg by mouth once a week. On Wednesdays, Disp: , Rfl:    metoprolol  tartrate (LOPRESSOR ) 25 MG tablet, TAKE 1 TABLET BY MOUTH TWICE DAILY, Disp: 180 tablet, Rfl: 0   nitroGLYCERIN  (NITROSTAT ) 0.4 MG SL tablet, Place under the tongue., Disp: , Rfl:    TRULICITY  3 MG/0.5ML SOPN, INJECT 3 MG (0.5 ML) UNDER  THE SKIN ONCE A WEEK, Disp: 8 mL, Rfl: 0   amoxicillin -clavulanate (AUGMENTIN ) 875-125 MG tablet, Take 1 tablet by mouth 2 (two) times daily. (Patient not taking: Reported on 06/28/2023), Disp: 20 tablet, Rfl: 0   baclofen  (LIORESAL ) 10 MG tablet, Take 0.5-1 tablets (5-10 mg total) by mouth 3 (three) times daily as needed for muscle spasms. (Patient not taking: Reported on 06/28/2023), Disp: 30 each, Rfl: 2   Cholecalciferol (VITAMIN D3) 125 MCG (5000 UT) CAPS, Take 1 capsule (5,000 Units total) by mouth daily. For 8 weeks, then start Vitamin D3 2,000 units daily (OTC) (Patient not taking: Reported on 06/28/2023), Disp: , Rfl:    cyanocobalamin  (VITAMIN B12) 1000 MCG tablet, Take 1 tablet (1,000 mcg total) by mouth daily. (Patient not taking: Reported on 06/28/2023), Disp: , Rfl:    levofloxacin  (LEVAQUIN ) 750 MG tablet, Take 1 tablet (750 mg total) by mouth daily for 3 days. Only take if not improving., Disp: 3 tablet, Rfl: 0  ------------------------------------------------------------------------- Social History   Tobacco Use   Smoking status: Former    Current packs/day: 0.00    Types: Cigarettes    Quit date: 08/16/2004    Years since quitting: 18.8   Smokeless tobacco: Former    Quit date: 2006   Tobacco comments:    quit 20 plus years  Vaping Use   Vaping status: Never Used  Substance Use Topics   Alcohol use: Not Currently    Alcohol/week: 2.0 standard drinks of alcohol    Types: 2 Cans of beer per week    Comment: SOCIAL   Drug use: No    Review of Systems Per HPI unless specifically indicated above     Objective:     BP (!) 140/76 (BP Location: Left Arm, Cuff Size: Normal)   Pulse 69   Ht 6\' 2"  (1.88 m)   Wt 250 lb (113.4 kg)   SpO2 94%   BMI 32.10 kg/m   Wt Readings from Last 3 Encounters:  07/03/23 250 lb (113.4 kg)  06/28/23 260 lb (117.9 kg)  06/04/23 258 lb 4 oz (117.1 kg)    Physical Exam Vitals and nursing note reviewed.  Constitutional:      General:  He is not in acute distress.    Appearance: He is well-developed. He is not diaphoretic.     Comments: Well-appearing, comfortable, cooperative  HENT:     Head: Normocephalic and atraumatic.  Eyes:     General:        Right eye: No discharge.        Left eye: No discharge.     Conjunctiva/sclera: Conjunctivae normal.  Neck:     Thyroid : No thyromegaly.  Cardiovascular:     Rate and Rhythm: Normal rate and regular rhythm.     Pulses: Normal pulses.     Heart sounds: Normal heart sounds. No murmur heard. Pulmonary:     Effort:  Pulmonary effort is normal. No respiratory distress.     Breath sounds: Normal breath sounds. No wheezing or rales.     Comments: Good air movement. No cough Musculoskeletal:        General: Normal range of motion.     Cervical back: Normal range of motion and neck supple.  Lymphadenopathy:     Cervical: No cervical adenopathy.  Skin:    General: Skin is warm and dry.     Findings: No erythema or rash.  Neurological:     Mental Status: He is alert and oriented to person, place, and time. Mental status is at baseline.  Psychiatric:        Behavior: Behavior normal.     Comments: Well groomed, good eye contact, normal speech and thoughts     I have personally reviewed the radiology report from 06/28/23  on CTA Chest.  CLINICAL DATA:  Hematuria and renal calculi.  Shortness of breath.   EXAM: CT ANGIOGRAPHY CHEST   CT ABDOMEN AND PELVIS WITH CONTRAST   TECHNIQUE: Multidetector CT imaging of the chest was performed using the standard protocol during bolus administration of intravenous contrast. Multiplanar CT image reconstructions and MIPs were obtained to evaluate the vascular anatomy. Multidetector CT imaging of the abdomen and pelvis was performed using the standard protocol during bolus administration of intravenous contrast.   RADIATION DOSE REDUCTION: This exam was performed according to the departmental dose-optimization program which  includes automated exposure control, adjustment of the mA and/or kV according to patient size and/or use of iterative reconstruction technique.   CONTRAST:  75mL OMNIPAQUE  IOHEXOL  350 MG/ML SOLN   COMPARISON:  Multiple exams, including 06/19/2023 and chest radiograph 06/28/2023   FINDINGS: Despite efforts by the technologist and patient, motion artifact is present on today's exam and could not be eliminated. This reduces exam sensitivity and specificity.   CTA CHEST FINDINGS   Cardiovascular: No filling defect is identified in the pulmonary arterial tree to suggest pulmonary embolus. Coronary, aortic arch, and branch vessel atherosclerotic vascular disease. Prior CABG. Mild to moderate cardiomegaly.   Mediastinum/Nodes: 1.0 cm right lower paratracheal node on image 59 series 2, likely inflammatory/reactive. Similarly there is an enlarged right infrahilar lymph node measuring 1.3 cm in short axis on image 91 series 2 which is likely reactive adenopathy given other findings in the right lower lobe. A left lower lobe lymph node is upper normal sized at 0.8 cm short axis.   Lungs/Pleura: Emphysema. Airspace opacity in the right lower lobe, a component of which is hazy and ground-glass compatible with a component of alveolitis. This area was clear on 06/19/2023 and accordingly is most compatible with pneumonia.   Musculoskeletal: Lower thoracic spondylosis.   Review of the MIP images confirms the above findings.   CT ABDOMEN and PELVIS FINDINGS   Hepatobiliary: Cholecystectomy.  Otherwise unremarkable.   Pancreas: Unremarkable   Spleen: Unremarkable   Adrenals/Urinary Tract: Both adrenal glands appear normal.   Persistent 1.7 cm calculus in the right renal pelvis with surrounding inflammatory stranding in the renal pelvis adipose tissue. Borderline fullness of a posterior left kidney upper pole calyx similar to previous without overt hydronephrosis.   Likewise stable  nonobstructive 1.2 cm right kidney lower pole calculus.   No compelling findings of pyelonephritis. Urinary bladder unremarkable.   Stomach/Bowel: Sigmoid colon diverticulosis.   Vascular/Lymphatic: Atherosclerosis is present, including aortoiliac atherosclerotic disease. Atheromatous plaque proximally in the superior mesenteric artery and along the origin of the celiac trunk, without overt  occlusion.   Reproductive: Unremarkable   Other: No supplemental non-categorized findings.   Musculoskeletal: Grade 1 degenerative anterolisthesis at L4-5. Spondylosis, degenerative disc disease, and congenitally short pedicles cause multilevel lumbar impingement especially at L3-4, L4-5, and L5-S1.   Review of the MIP images confirms the above findings.   IMPRESSION: 1. No filling defect is identified in the pulmonary arterial tree to suggest pulmonary embolus. 2. Right lower lobe pneumonia with a component of alveolitis. 3. Persistent 1.7 cm calculus in the right renal pelvis with surrounding inflammatory stranding in the renal pelvis adipose tissue. No overt hydronephrosis. 4. Stable nonobstructive 1.2 cm right kidney lower pole calculus. 5. Sigmoid colon diverticulosis. 6. Multilevel lumbar impingement. 7. Aortic Atherosclerosis (ICD10-I70.0) and Emphysema (ICD10-J43.9).     Electronically Signed   By: Freida Jes M.D.   On: 06/28/2023 12:54  Results for orders placed or performed during the hospital encounter of 06/28/23  Blood Culture (routine x 2)   Collection Time: 06/28/23  9:43 AM   Specimen: Left Antecubital; Blood  Result Value Ref Range   Specimen Description LEFT ANTECUBITAL    Special Requests      BOTTLES DRAWN AEROBIC AND ANAEROBIC Blood Culture adequate volume   Culture      NO GROWTH 5 DAYS Performed at [facility], [address], [place], [place] [address]    Report Status 07/03/2023 FINAL   Urine Culture   Collection Time: 06/28/23   9:43 AM   Specimen: Urine, Random  Result Value Ref Range   Specimen Description      URINE, RANDOM Performed at [facility], [address], [place], [place] [address]    Special Requests      NONE Reflexed from [phone] Performed at [facility], [address] [place], [place] [address]    Culture      NO GROWTH Performed at [facility], [address] [place] [place], [place] [address]    Report Status 06/29/2023 FINAL   Urinalysis, w/ Reflex to Culture (Infection Suspected) -Urine, Clean Catch   Collection Time: 06/28/23  9:43 AM  Result Value Ref Range   Specimen Source URINE, CLEAN CATCH    Color, Urine YELLOW (A) YELLOW   APPearance CLEAR (A) CLEAR   Specific Gravity, Urine 1.030 1.005 - 1.030   pH 5.0 5.0 - 8.0   Glucose, UA >=500 (A) NEGATIVE mg/dL   Hgb urine dipstick MODERATE (A) NEGATIVE   Bilirubin Urine NEGATIVE NEGATIVE   Ketones, ur 80 (A) NEGATIVE mg/dL   Protein, ur 30 (A) NEGATIVE mg/dL   Nitrite NEGATIVE NEGATIVE   Leukocytes,Ua NEGATIVE NEGATIVE   RBC / HPF 6-10 0 - 5 RBC/hpf   WBC, UA 21-50 0 - 5 WBC/hpf   Bacteria, UA NONE SEEN NONE SEEN   Squamous Epithelial / HPF 0-5 0 - 5 /HPF   Mucus PRESENT   Strep pneumoniae urinary antigen   Collection Time: 06/28/23  9:43 AM  Result Value Ref Range   Strep Pneumo Urinary Antigen NEGATIVE NEGATIVE  Legionella Pneumophila Serogp 1 Ur Ag   Collection Time: 06/28/23  9:43 AM  Result Value Ref Range   L. pneumophila Serogp 1 Ur Ag Negative Negative   Source of Sample URINE, RANDOM   Resp panel by RT-PCR (RSV, Flu A&B, Covid) Anterior Nasal Swab   Collection Time: 06/28/23  9:44 AM   Specimen: Anterior Nasal Swab  Result Value Ref Range   SARS Coronavirus 2 by RT PCR NEGATIVE NEGATIVE  Influenza A by PCR NEGATIVE NEGATIVE   Influenza B by PCR NEGATIVE NEGATIVE   Resp Syncytial Virus by PCR NEGATIVE NEGATIVE  Blood Culture (routine x 2)   Collection Time: 06/28/23  9:44 AM    Specimen: Right Antecubital; Blood  Result Value Ref Range   Specimen Description RIGHT ANTECUBITAL    Special Requests      BOTTLES DRAWN AEROBIC AND ANAEROBIC Blood Culture adequate volume   Culture      NO GROWTH 5 DAYS Performed at [facility], [address] [place], [place] [address]    Report Status 07/03/2023 FINAL   Lactic acid, plasma   Collection Time: 06/28/23  9:44 AM  Result Value Ref Range   Lactic Acid, Venous 1.8 0.5 - 1.9 mmol/L  Comprehensive metabolic panel   Collection Time: 06/28/23  9:44 AM  Result Value Ref Range   Sodium 136 135 - 145 mmol/L   Potassium 3.5 3.5 - 5.1 mmol/L   Chloride 101 98 - 111 mmol/L   CO2 18 (L) 22 - 32 mmol/L   Glucose, Bld 192 (H) 70 - 99 mg/dL   BUN 16 8 - 23 mg/dL   Creatinine, Ser 6.04 0.61 - 1.24 mg/dL   Calcium  8.4 (L) 8.9 - 10.3 mg/dL   Total Protein 6.8 6.5 - 8.1 g/dL   Albumin 3.8 3.5 - 5.0 g/dL   AST 17 15 - 41 U/L   ALT 19 0 - 44 U/L   Alkaline Phosphatase 36 (L) 38 - 126 U/L   Total Bilirubin 2.8 (H) 0.0 - 1.2 mg/dL   GFR, Estimated >54 >09 mL/min   Anion gap 17 (H) 5 - 15  CBC with Differential   Collection Time: 06/28/23  9:44 AM  Result Value Ref Range   WBC 9.3 4.0 - 10.5 K/uL   RBC 4.77 4.22 - 5.81 MIL/uL   Hemoglobin 16.3 13.0 - 17.0 g/dL   HCT 81.1 91.4 - 78.2 %   MCV 97.3 80.0 - 100.0 fL   MCH 34.2 (H) 26.0 - 34.0 pg   MCHC 35.1 30.0 - 36.0 g/dL   RDW 95.6 21.3 - 08.6 %   Platelets 160 150 - 400 K/uL   nRBC 0.0 0.0 - 0.2 %   Neutrophils Relative % 84 %   Neutro Abs 7.8 (H) 1.7 - 7.7 K/uL   Lymphocytes Relative 8 %   Lymphs Abs 0.8 0.7 - 4.0 K/uL   Monocytes Relative 7 %   Monocytes Absolute 0.7 0.1 - 1.0 K/uL   Eosinophils Relative 0 %   Eosinophils Absolute 0.0 0.0 - 0.5 K/uL   Basophils Relative 0 %   Basophils Absolute 0.0 0.0 - 0.1 K/uL   Immature Granulocytes 1 %   Abs Immature Granulocytes 0.05 0.00 - 0.07 K/uL  Protime-INR   Collection Time: 06/28/23  9:44 AM  Result  Value Ref Range   Prothrombin Time 15.1 11.4 - 15.2 seconds   INR 1.2 0.8 - 1.2  Brain natriuretic peptide   Collection Time: 06/28/23  9:44 AM  Result Value Ref Range   B Natriuretic Peptide 91.6 0.0 - 100.0 pg/mL  Troponin I (High Sensitivity)   Collection Time: 06/28/23  9:44 AM  Result Value Ref Range   Troponin I (High Sensitivity) 9 <18 ng/L  Lactic acid, plasma   Collection Time: 06/28/23 11:45 AM  Result Value Ref Range   Lactic Acid, Venous 1.1 0.5 - 1.9 mmol/L  Troponin I (High Sensitivity)   Collection Time:  06/28/23 11:45 AM  Result Value Ref Range   Troponin I (High Sensitivity) 14 <18 ng/L  Blood gas, venous   Collection Time: 06/28/23  1:06 PM  Result Value Ref Range   pH, Ven 7.43 7.25 - 7.43   pCO2, Ven 41 (L) 44 - 60 mmHg   pO2, Ven 37 32 - 45 mmHg   Bicarbonate 27.2 20.0 - 28.0 mmol/L   Acid-Base Excess 2.6 (H) 0.0 - 2.0 mmol/L   O2 Saturation 67.7 %   Patient temperature 37.0    Collection site VEIN   Basic metabolic panel   Collection Time: 06/28/23  1:06 PM  Result Value Ref Range   Sodium 136 135 - 145 mmol/L   Potassium 3.7 3.5 - 5.1 mmol/L   Chloride 101 98 - 111 mmol/L   CO2 23 22 - 32 mmol/L   Glucose, Bld 183 (H) 70 - 99 mg/dL   BUN 18 8 - 23 mg/dL   Creatinine, Ser 4.09 0.61 - 1.24 mg/dL   Calcium  8.4 (L) 8.9 - 10.3 mg/dL   GFR, Estimated >81 >19 mL/min   Anion gap 12 5 - 15  Hemoglobin A1c   Collection Time: 06/28/23  4:00 PM  Result Value Ref Range   Hgb A1c MFr Bld 6.5 (H) 4.8 - 5.6 %   Mean Plasma Glucose 139.85 mg/dL  Glucose, capillary   Collection Time: 06/28/23  4:33 PM  Result Value Ref Range   Glucose-Capillary 204 (H) 70 - 99 mg/dL  CBC   Collection Time: 06/29/23  5:04 AM  Result Value Ref Range   WBC 7.4 4.0 - 10.5 K/uL   RBC 4.22 4.22 - 5.81 MIL/uL   Hemoglobin 14.3 13.0 - 17.0 g/dL   HCT 14.7 82.9 - 56.2 %   MCV 96.9 80.0 - 100.0 fL   MCH 33.9 26.0 - 34.0 pg   MCHC 35.0 30.0 - 36.0 g/dL   RDW 13.0 86.5 - 78.4 %    Platelets 143 (L) 150 - 400 K/uL   nRBC 0.0 0.0 - 0.2 %  Basic metabolic panel   Collection Time: 06/29/23  5:04 AM  Result Value Ref Range   Sodium 137 135 - 145 mmol/L   Potassium 3.5 3.5 - 5.1 mmol/L   Chloride 102 98 - 111 mmol/L   CO2 23 22 - 32 mmol/L   Glucose, Bld 127 (H) 70 - 99 mg/dL   BUN 15 8 - 23 mg/dL   Creatinine, Ser 6.96 0.61 - 1.24 mg/dL   Calcium  8.1 (L) 8.9 - 10.3 mg/dL   GFR, Estimated >29 >52 mL/min   Anion gap 12 5 - 15  Glucose, capillary   Collection Time: 06/29/23  7:32 AM  Result Value Ref Range   Glucose-Capillary 116 (H) 70 - 99 mg/dL  Glucose, capillary   Collection Time: 06/29/23 11:43 AM  Result Value Ref Range   Glucose-Capillary 225 (H) 70 - 99 mg/dL  Glucose, capillary   Collection Time: 06/29/23  4:36 PM  Result Value Ref Range   Glucose-Capillary 173 (H) 70 - 99 mg/dL  Glucose, capillary   Collection Time: 06/29/23  9:23 PM  Result Value Ref Range   Glucose-Capillary 349 (H) 70 - 99 mg/dL  Glucose, capillary   Collection Time: 06/30/23 12:28 AM  Result Value Ref Range   Glucose-Capillary 186 (H) 70 - 99 mg/dL  Basic metabolic panel with GFR   Collection Time: 06/30/23  5:01 AM  Result Value Ref Range   Sodium 131 (  L) 135 - 145 mmol/L   Potassium 3.3 (L) 3.5 - 5.1 mmol/L   Chloride 100 98 - 111 mmol/L   CO2 21 (L) 22 - 32 mmol/L   Glucose, Bld 196 (H) 70 - 99 mg/dL   BUN 16 8 - 23 mg/dL   Creatinine, Ser 4.54 0.61 - 1.24 mg/dL   Calcium  8.2 (L) 8.9 - 10.3 mg/dL   GFR, Estimated >09 >81 mL/min   Anion gap 10 5 - 15  Magnesium    Collection Time: 06/30/23  5:01 AM  Result Value Ref Range   Magnesium  2.2 1.7 - 2.4 mg/dL  Glucose, capillary   Collection Time: 06/30/23  8:16 AM  Result Value Ref Range   Glucose-Capillary 196 (H) 70 - 99 mg/dL  Glucose, capillary   Collection Time: 06/30/23 11:24 AM  Result Value Ref Range   Glucose-Capillary 237 (H) 70 - 99 mg/dL  Glucose, capillary   Collection Time: 06/30/23  4:20 PM   Result Value Ref Range   Glucose-Capillary 299 (H) 70 - 99 mg/dL      Assessment & Plan:   Problem List Items Addressed This Visit     CAP (community acquired pneumonia) - Primary   Relevant Medications   levofloxacin  (LEVAQUIN ) 750 MG tablet    CAP Pneumonia RLL , Hospital Follow-up Recent hospitalization for bacterial pneumonia, admitted with sepsis. Treated with IV ceftriaxone, Azithromycin  and oral Levaquin  for remaining 2 days on discharge. Hospitalization uncomplicated, did not require oxygen on discharge. Currently stable post-antibiotic course last dose was yesterday 5/28  CT and CXR done in hospital  - Provide backup prescription for three doses of Levaquin  750 mg if symptoms worsen over next 48-72 hours, this would complete 5 day full course on oral Levaquin  750 if he needed it. But I advised him given the IV treatments he received, it is reasonable to not take additional antibiotics at this time since history and exam suggest the pneumonia has resolved. - Advise monitoring symptoms and avoiding unnecessary antibiotics. - Discuss potential Levaquin  side effects, including muscle injury and gastrointestinal issues. - Instruct to seek medical attention if symptoms worsen or new symptoms develop.  Consider repeat imaging X-ray if any lingering symptoms or new concerns. Last imaging 5/24, therefore too soon today for repeat imaging.  Identified on CT imaging, Large kidney stones, planned surgery Two large kidney stones identified, asymptomatic. Surgery planned for removal by Urology       Meds ordered this encounter  Medications   levofloxacin  (LEVAQUIN ) 750 MG tablet    Sig: Take 1 tablet (750 mg total) by mouth daily for 3 days. Only take if not improving.    Dispense:  3 tablet    Refill:  0    Follow up plan: Return if symptoms worsen or fail to improve.   Domingo Friend, DO [facility] 07/03/2023, 4:08 PM

## 2023-07-04 ENCOUNTER — Other Ambulatory Visit: Payer: Self-pay | Admitting: Family Medicine

## 2023-07-04 ENCOUNTER — Other Ambulatory Visit (HOSPITAL_COMMUNITY): Payer: Self-pay | Admitting: Urology

## 2023-07-04 ENCOUNTER — Other Ambulatory Visit: Payer: Self-pay | Admitting: Urology

## 2023-07-04 DIAGNOSIS — N200 Calculus of kidney: Secondary | ICD-10-CM

## 2023-07-04 DIAGNOSIS — I10 Essential (primary) hypertension: Secondary | ICD-10-CM

## 2023-07-05 NOTE — Telephone Encounter (Signed)
 Refill sent on 06/25/23 #90/0 refill, will refuse this request as too early to refill.  Requested Prescriptions  Refused Prescriptions Disp Refills   amLODipine  (NORVASC ) 10 MG tablet [Pharmacy Med Name: AMLODIPINE  BESYLATE 10 MG TAB] 90 tablet 0    Sig: TAKE 1 TABLET BY MOUTH ONCE DAILY     Cardiovascular: Calcium  Channel Blockers 2 Failed - 07/05/2023  4:56 PM      Failed - Last BP in normal range    BP Readings from Last 1 Encounters:  07/03/23 (!) 140/76         Passed - Last Heart Rate in normal range    Pulse Readings from Last 1 Encounters:  07/03/23 69         Passed - Valid encounter within last 6 months    Recent Outpatient Visits           2 days ago Community acquired pneumonia of right lower lobe of lung   [place] [facility] [place], Kayleen Party, DO   1 month ago Acute non-recurrent frontal sinusitis   [place] [facility] [place], Kayleen Party, DO       Future Appointments             In 1 month [place], Kayleen Party, DO [facility]

## 2023-07-07 DIAGNOSIS — L2089 Other atopic dermatitis: Secondary | ICD-10-CM | POA: Diagnosis not present

## 2023-07-07 DIAGNOSIS — L814 Other melanin hyperpigmentation: Secondary | ICD-10-CM | POA: Diagnosis not present

## 2023-07-07 DIAGNOSIS — D225 Melanocytic nevi of trunk: Secondary | ICD-10-CM | POA: Diagnosis not present

## 2023-07-07 DIAGNOSIS — L570 Actinic keratosis: Secondary | ICD-10-CM | POA: Diagnosis not present

## 2023-07-07 DIAGNOSIS — Z85828 Personal history of other malignant neoplasm of skin: Secondary | ICD-10-CM | POA: Diagnosis not present

## 2023-07-07 DIAGNOSIS — L821 Other seborrheic keratosis: Secondary | ICD-10-CM | POA: Diagnosis not present

## 2023-07-07 DIAGNOSIS — L853 Xerosis cutis: Secondary | ICD-10-CM | POA: Diagnosis not present

## 2023-07-07 DIAGNOSIS — Z79899 Other long term (current) drug therapy: Secondary | ICD-10-CM | POA: Diagnosis not present

## 2023-07-07 DIAGNOSIS — Z08 Encounter for follow-up examination after completed treatment for malignant neoplasm: Secondary | ICD-10-CM | POA: Diagnosis not present

## 2023-07-08 DIAGNOSIS — Z79899 Other long term (current) drug therapy: Secondary | ICD-10-CM | POA: Diagnosis not present

## 2023-07-09 ENCOUNTER — Other Ambulatory Visit: Payer: Self-pay | Admitting: Family Medicine

## 2023-07-09 DIAGNOSIS — I2510 Atherosclerotic heart disease of native coronary artery without angina pectoris: Secondary | ICD-10-CM

## 2023-07-10 NOTE — Telephone Encounter (Signed)
 Requested medications are due for refill today.  unsure  Requested medications are on the active medications list.  yes  Last refill. unsure  Future visit scheduled.   yes  Notes to clinic.  Medication is historical,    Requested Prescriptions  Pending Prescriptions Disp Refills   REPATHA  SURECLICK 140 MG/ML SOAJ [Pharmacy Med Name: REPATHA  SURECLICK 140 MG/ML SUBQ SO] 2 mL     Sig: INJECT 140MG  SUBCUTANEOUSLY EVERY 14 DAYS     Cardiovascular: PCSK9 Inhibitors Passed - 07/10/2023  4:43 PM      Passed - Valid encounter within last 12 months    Recent Outpatient Visits           1 week ago Community acquired pneumonia of right lower lobe of lung   [facility] [place], Kayleen Party, DO   1 month ago Acute non-recurrent frontal sinusitis   [facility] [place], Kayleen Party, DO       Future Appointments             In 3 weeks Romeo Co, Kayleen Party, DO [facility], [facility]            Passed - Lipid Panel completed within the last 12 months    Cholesterol  Date Value Ref Range Status  01/17/2023 172 <200 mg/dL Final  81/19/1478 295 0 - 200 mg/dL Final   Ldl Cholesterol, Calc  Date Value Ref Range Status  07/27/2012 77 0 - 100 mg/dL Final   LDL Cholesterol (Calc)  Date Value Ref Range Status  01/17/2023  mg/dL (calc) Final    Comment:    . LDL cholesterol not calculated. Triglyceride levels greater than 400 mg/dL invalidate calculated LDL results. . Reference range: <100 . Desirable range <100 mg/dL for primary prevention;   <70 mg/dL for patients with CHD or diabetic patients  with > or = 2 CHD risk factors. Aaron Aas LDL-C is now calculated using the Martin-Hopkins  calculation, which is a validated novel method providing  better accuracy than the Friedewald equation in the  estimation of LDL-C.  Melinda Sprawls et al. Erroll Heard. 6213;086(57): 2061-2068   (http://education.[employer].com/faq/FAQ164)    HDL Cholesterol  Date Value Ref Range Status  07/27/2012 34 (L) 40 - 60 mg/dL Final   HDL  Date Value Ref Range Status  01/17/2023 29 (L) > OR = 40 mg/dL Final   Triglycerides  Date Value Ref Range Status  01/17/2023 580 (H) <150 mg/dL Final    Comment:    . If a non-fasting specimen was collected, consider repeat triglyceride testing on a fasting specimen if clinically indicated.  Imagene Mam et al. J. of Clin. Lipidol. 2015;9:129-169. . . There is increased risk of pancreatitis when the  triglyceride concentration is very high  (> or = 500 mg/dL, especially if > or = 8469 mg/dL).  Imagene Mam et al. J. of Clin. Lipidol. 2015;9:129-169. .   07/27/2012 212 (H) 0 - 200 mg/dL Final

## 2023-08-04 ENCOUNTER — Ambulatory Visit (INDEPENDENT_AMBULATORY_CARE_PROVIDER_SITE_OTHER): Payer: Self-pay | Admitting: Family Medicine

## 2023-08-04 ENCOUNTER — Encounter: Payer: Self-pay | Admitting: Family Medicine

## 2023-08-04 VITALS — BP 128/68 | HR 65 | Ht 74.0 in | Wt 255.4 lb

## 2023-08-04 DIAGNOSIS — F331 Major depressive disorder, recurrent, moderate: Secondary | ICD-10-CM | POA: Diagnosis not present

## 2023-08-04 DIAGNOSIS — E1169 Type 2 diabetes mellitus with other specified complication: Secondary | ICD-10-CM | POA: Diagnosis not present

## 2023-08-04 DIAGNOSIS — M170 Bilateral primary osteoarthritis of knee: Secondary | ICD-10-CM | POA: Diagnosis not present

## 2023-08-04 DIAGNOSIS — Z89421 Acquired absence of other right toe(s): Secondary | ICD-10-CM

## 2023-08-04 DIAGNOSIS — I10 Essential (primary) hypertension: Secondary | ICD-10-CM

## 2023-08-04 DIAGNOSIS — Z89422 Acquired absence of other left toe(s): Secondary | ICD-10-CM | POA: Diagnosis not present

## 2023-08-04 DIAGNOSIS — Z7985 Long-term (current) use of injectable non-insulin antidiabetic drugs: Secondary | ICD-10-CM | POA: Diagnosis not present

## 2023-08-04 MED ORDER — ESCITALOPRAM OXALATE 20 MG PO TABS: 20.0000 mg | ORAL_TABLET | Freq: Every day | ORAL | 1 refills | Status: DC

## 2023-08-04 MED ORDER — METFORMIN HCL 500 MG PO TABS: ORAL_TABLET | ORAL | 1 refills | Status: AC

## 2023-08-04 MED ORDER — METOPROLOL TARTRATE 25 MG PO TABS: 25.0000 mg | ORAL_TABLET | Freq: Two times a day (BID) | ORAL | 1 refills | Status: AC

## 2023-08-04 MED ORDER — GABAPENTIN 100 MG PO CAPS: 100.0000 mg | ORAL_CAPSULE | Freq: Two times a day (BID) | ORAL | 1 refills | Status: AC

## 2023-08-04 MED ORDER — AMLODIPINE BESYLATE 10 MG PO TABS: 10.0000 mg | ORAL_TABLET | Freq: Every day | ORAL | 1 refills | Status: DC

## 2023-08-04 NOTE — Progress Notes (Signed)
 Subjective:    Patient ID: Kevin Perry, male    DOB: 1950/03/28, 73 y.o.   MRN: [id number]  Kevin Perry is a 73 y.o. male presenting on 08/04/2023 for Diabetes   HPI  Discussed the use of AI scribe software for clinical note transcription with the patient, who gave verbal consent to proceed.  History of Present Illness   Kevin Perry is a 73 year old male with diabetes who presents for a routine follow-up.  - History of pneumonia treated around Jul 03, 2023 - Current oxygen  saturation is 94% - Home oxygen  saturation ranges from 94% to 96% No new concerns or symptoms of pneumonia. He is UTD on prevnar-20 vaccine     CHRONIC DM, Type 2: - A1c improved to 6.5% on Jun 28, 2023, from 7.9% in December 2024 On [employer] PAP program for Trulicity  3mg  weekly Meds: Trulicity  3mg  weekly, Metformin  2AM and 1 PM, Jardiance 25mg  daily Off Glipizide  Reports good compliance. Tolerating well w/o side-effects Currently on ACEi Lifestyle: - Diet (trying to improve diet reduce carb intake) - Exercise (walking limited now) Denies hypoglycemia, polyuria, visual changes, numbness or tingling.   CHRONIC HTN: Reports continues on meds doing well Current Meds - AMlodipine  10mg  daily, Lisinopril  20mg  TWICE A DAY, Metoprolol  25mg  TWICE A DAY    Reports good compliance, took meds today. Tolerating well, w/o complaints. Denies CP, dyspnea, HA, edema, dizziness / lightheadedness   HYPERLIPIDEMIA: Previous Hypertriglyceridemia 580  On Repatha  now. He did stop the Atorvastatin , but feels much improved on lower extremity pain  Health Maintenance:   PSA 0.73 negative      08/04/2023    9:42 AM 07/03/2023   11:29 AM 06/04/2023   10:03 AM  Depression screen PHQ 2/9  Decreased Interest 0 0 0  Down, Depressed, Hopeless 0 0 0  PHQ - 2 Score 0 0 0  Altered sleeping 0 0 0  Tired, decreased energy 0 0 0  Change in appetite 0 0 0  Feeling bad or failure about yourself  0 0 0  Trouble  concentrating 0 0 0  Moving slowly or fidgety/restless 0 0 0  Suicidal thoughts 0 0 0  PHQ-9 Score 0 0 0       08/04/2023    9:42 AM 07/03/2023   11:29 AM 06/04/2023   10:03 AM 01/20/2023    9:09 AM  GAD 7 : Generalized Anxiety Score  Nervous, Anxious, on Edge 0 0 0 0  Control/stop worrying 0 0 0 0  Worry too much - different things 0 0 0 0  Trouble relaxing 0 0 0 0  Restless 0 0 0 0  Easily annoyed or irritable 0 0 0 0  Afraid - awful might happen 0 0 0 0  Total GAD 7 Score 0 0 0 0    Social History   Tobacco Use   Smoking status: Former    Current packs/day: 0.00    Types: Cigarettes    Quit date: 08/16/2004    Years since quitting: 18.9   Smokeless tobacco: Former    Quit date: 2006   Tobacco comments:    quit 20 plus years  Vaping Use   Vaping status: Never Used  Substance Use Topics   Alcohol use: Not Currently    Alcohol/week: 2.0 standard drinks of alcohol    Types: 2 Cans of beer per week    Comment: SOCIAL   Drug use: No    Review of Systems  Per HPI unless specifically indicated above     Objective:    BP 128/68 (BP Location: Right Arm, Patient Position: Sitting, Cuff Size: Large)   Pulse 65   Ht 6' 2 (1.88 m)   Wt 255 lb 6.4 oz (115.8 kg)   SpO2 94%   BMI 32.79 kg/m   Wt Readings from Last 3 Encounters:  08/04/23 255 lb 6.4 oz (115.8 kg)  07/03/23 250 lb (113.4 kg)  06/28/23 260 lb (117.9 kg)    Physical Exam Vitals and nursing note reviewed.  Constitutional:      General: He is not in acute distress.    Appearance: He is well-developed. He is not diaphoretic.     Comments: Well-appearing, comfortable, cooperative  HENT:     Head: Normocephalic and atraumatic.   Eyes:     General:        Right eye: No discharge.        Left eye: No discharge.     Conjunctiva/sclera: Conjunctivae normal.   Neck:     Thyroid : No thyromegaly.   Cardiovascular:     Rate and Rhythm: Normal rate and regular rhythm.     Pulses: Normal pulses.      Heart sounds: Normal heart sounds. No murmur heard. Pulmonary:     Effort: Pulmonary effort is normal. No respiratory distress.     Breath sounds: Normal breath sounds. No wheezing or rales.   Musculoskeletal:        General: Normal range of motion.     Cervical back: Normal range of motion and neck supple.  Lymphadenopathy:     Cervical: No cervical adenopathy.   Skin:    General: Skin is warm and dry.     Findings: No erythema or rash.   Neurological:     Mental Status: He is alert and oriented to person, place, and time. Mental status is at baseline.   Psychiatric:        Behavior: Behavior normal.     Comments: Well groomed, good eye contact, normal speech and thoughts    Diabetic Foot Exam - Simple   Simple Foot Form Diabetic Foot exam was performed with the following findings: Yes 08/04/2023  9:38 AM  Visual Inspection See comments: Yes Sensation Testing Intact to touch and monofilament testing bilaterally: Yes Pulse Check Posterior Tibialis and Dorsalis pulse intact bilaterally: Yes Comments S/p chronic toe amputation, left foot partial 2nd toe and missing 3rd toe. R foot s/p amputation of 2nd and 3rd toe      Results for orders placed or performed during the hospital encounter of 06/28/23  Blood Culture (routine x 2)   Collection Time: 06/28/23  9:43 AM   Specimen: Left Antecubital; Blood  Result Value Ref Range   Specimen Description LEFT ANTECUBITAL    Special Requests      BOTTLES DRAWN AEROBIC AND ANAEROBIC Blood Culture adequate volume   Culture      NO GROWTH 5 DAYS Performed at [facility], [address], [place], [place] [address]    Report Status 07/03/2023 FINAL   Urine Culture   Collection Time: 06/28/23  9:43 AM   Specimen: Urine, Random  Result Value Ref Range   Specimen Description      URINE, RANDOM Performed at [facility], [address], [place], [place] [address]    Special Requests      NONE Reflexed from  [id number] Performed at [facility], [address], [place], [place] [address]  Culture      NO GROWTH Performed at [facility], [address] [place], [place] [address]    Report Status 06/29/2023 FINAL   Urinalysis, w/ Reflex to Culture (Infection Suspected) -Urine, Clean Catch   Collection Time: 06/28/23  9:43 AM  Result Value Ref Range   Specimen Source URINE, CLEAN CATCH    Color, Urine YELLOW (A) YELLOW   APPearance CLEAR (A) CLEAR   Specific Gravity, Urine 1.030 1.005 - 1.030   pH 5.0 5.0 - 8.0   Glucose, UA >=500 (A) NEGATIVE mg/dL   Hgb urine dipstick MODERATE (A) NEGATIVE   Bilirubin Urine NEGATIVE NEGATIVE   Ketones, ur 80 (A) NEGATIVE mg/dL   Protein, ur 30 (A) NEGATIVE mg/dL   Nitrite NEGATIVE NEGATIVE   Leukocytes,Ua NEGATIVE NEGATIVE   RBC / HPF 6-10 0 - 5 RBC/hpf   WBC, UA 21-50 0 - 5 WBC/hpf   Bacteria, UA NONE SEEN NONE SEEN   Squamous Epithelial / HPF 0-5 0 - 5 /HPF   Mucus PRESENT   Strep pneumoniae urinary antigen   Collection Time: 06/28/23  9:43 AM  Result Value Ref Range   Strep Pneumo Urinary Antigen NEGATIVE NEGATIVE  Legionella Pneumophila Serogp 1 Ur Ag   Collection Time: 06/28/23  9:43 AM  Result Value Ref Range   L. pneumophila Serogp 1 Ur Ag Negative Negative   Source of Sample URINE, RANDOM   Resp panel by RT-PCR (RSV, Flu A&B, Covid) Anterior Nasal Swab   Collection Time: 06/28/23  9:44 AM   Specimen: Anterior Nasal Swab  Result Value Ref Range   SARS Coronavirus 2 by RT PCR NEGATIVE NEGATIVE   Influenza A by PCR NEGATIVE NEGATIVE   Influenza B by PCR NEGATIVE NEGATIVE   Resp Syncytial Virus by PCR NEGATIVE NEGATIVE  Blood Culture (routine x 2)   Collection Time: 06/28/23  9:44 AM   Specimen: Right Antecubital; Blood  Result Value Ref Range   Specimen Description RIGHT ANTECUBITAL    Special Requests      BOTTLES DRAWN AEROBIC AND ANAEROBIC Blood Culture adequate volume   Culture      NO GROWTH 5 DAYS Performed  at [facility], [address] [place], [place] [address]    Report Status 07/03/2023 FINAL   Lactic acid, plasma   Collection Time: 06/28/23  9:44 AM  Result Value Ref Range   Lactic Acid, Venous 1.8 0.5 - 1.9 mmol/L  Comprehensive metabolic panel   Collection Time: 06/28/23  9:44 AM  Result Value Ref Range   Sodium 136 135 - 145 mmol/L   Potassium 3.5 3.5 - 5.1 mmol/L   Chloride 101 98 - 111 mmol/L   CO2 18 (L) 22 - 32 mmol/L   Glucose, Bld 192 (H) 70 - 99 mg/dL   BUN 16 8 - 23 mg/dL   Creatinine, Ser 9.05 0.61 - 1.24 mg/dL   Calcium  8.4 (L) 8.9 - 10.3 mg/dL   Total Protein 6.8 6.5 - 8.1 g/dL   Albumin 3.8 3.5 - 5.0 g/dL   AST 17 15 - 41 U/L   ALT 19 0 - 44 U/L   Alkaline Phosphatase 36 (L) 38 - 126 U/L   Total Bilirubin 2.8 (H) 0.0 - 1.2 mg/dL   GFR, Estimated >39 >39 mL/min   Anion gap 17 (H) 5 - 15  CBC with Differential   Collection Time: 06/28/23  9:44 AM  Result Value Ref Range   WBC 9.3 4.0 - 10.5  K/uL   RBC 4.77 4.22 - 5.81 MIL/uL   Hemoglobin 16.3 13.0 - 17.0 g/dL   HCT 53.5 60.9 - 47.9 %   MCV 97.3 80.0 - 100.0 fL   MCH 34.2 (H) 26.0 - 34.0 pg   MCHC 35.1 30.0 - 36.0 g/dL   RDW 85.6 88.4 - 84.4 %   Platelets 160 150 - 400 K/uL   nRBC 0.0 0.0 - 0.2 %   Neutrophils Relative % 84 %   Neutro Abs 7.8 (H) 1.7 - 7.7 K/uL   Lymphocytes Relative 8 %   Lymphs Abs 0.8 0.7 - 4.0 K/uL   Monocytes Relative 7 %   Monocytes Absolute 0.7 0.1 - 1.0 K/uL   Eosinophils Relative 0 %   Eosinophils Absolute 0.0 0.0 - 0.5 K/uL   Basophils Relative 0 %   Basophils Absolute 0.0 0.0 - 0.1 K/uL   Immature Granulocytes 1 %   Abs Immature Granulocytes 0.05 0.00 - 0.07 K/uL  Protime-INR   Collection Time: 06/28/23  9:44 AM  Result Value Ref Range   Prothrombin Time 15.1 11.4 - 15.2 seconds   INR 1.2 0.8 - 1.2  Brain natriuretic peptide   Collection Time: 06/28/23  9:44 AM  Result Value Ref Range   B Natriuretic Peptide 91.6 0.0 - 100.0 pg/mL  Troponin I (High  Sensitivity)   Collection Time: 06/28/23  9:44 AM  Result Value Ref Range   Troponin I (High Sensitivity) 9 <18 ng/L  Lactic acid, plasma   Collection Time: 06/28/23 11:45 AM  Result Value Ref Range   Lactic Acid, Venous 1.1 0.5 - 1.9 mmol/L  Troponin I (High Sensitivity)   Collection Time: 06/28/23 11:45 AM  Result Value Ref Range   Troponin I (High Sensitivity) 14 <18 ng/L  Blood gas, venous   Collection Time: 06/28/23  1:06 PM  Result Value Ref Range   pH, Ven 7.43 7.25 - 7.43   pCO2, Ven 41 (L) 44 - 60 mmHg   pO2, Ven 37 32 - 45 mmHg   Bicarbonate 27.2 20.0 - 28.0 mmol/L   Acid-Base Excess 2.6 (H) 0.0 - 2.0 mmol/L   O2 Saturation 67.7 %   Patient temperature 37.0    Collection site VEIN   Basic metabolic panel   Collection Time: 06/28/23  1:06 PM  Result Value Ref Range   Sodium 136 135 - 145 mmol/L   Potassium 3.7 3.5 - 5.1 mmol/L   Chloride 101 98 - 111 mmol/L   CO2 23 22 - 32 mmol/L   Glucose, Bld 183 (H) 70 - 99 mg/dL   BUN 18 8 - 23 mg/dL   Creatinine, Ser 9.06 0.61 - 1.24 mg/dL   Calcium  8.4 (L) 8.9 - 10.3 mg/dL   GFR, Estimated >39 >39 mL/min   Anion gap 12 5 - 15  Hemoglobin A1c   Collection Time: 06/28/23  4:00 PM  Result Value Ref Range   Hgb A1c MFr Bld 6.5 (H) 4.8 - 5.6 %   Mean Plasma Glucose 139.85 mg/dL  Glucose, capillary   Collection Time: 06/28/23  4:33 PM  Result Value Ref Range   Glucose-Capillary 204 (H) 70 - 99 mg/dL  CBC   Collection Time: 06/29/23  5:04 AM  Result Value Ref Range   WBC 7.4 4.0 - 10.5 K/uL   RBC 4.22 4.22 - 5.81 MIL/uL   Hemoglobin 14.3 13.0 - 17.0 g/dL   HCT 59.0 60.9 - 47.9 %   MCV 96.9 80.0 - 100.0 fL  MCH 33.9 26.0 - 34.0 pg   MCHC 35.0 30.0 - 36.0 g/dL   RDW 85.7 88.4 - 84.4 %   Platelets 143 (L) 150 - 400 K/uL   nRBC 0.0 0.0 - 0.2 %  Basic metabolic panel   Collection Time: 06/29/23  5:04 AM  Result Value Ref Range   Sodium 137 135 - 145 mmol/L   Potassium 3.5 3.5 - 5.1 mmol/L   Chloride 102 98 - 111  mmol/L   CO2 23 22 - 32 mmol/L   Glucose, Bld 127 (H) 70 - 99 mg/dL   BUN 15 8 - 23 mg/dL   Creatinine, Ser 9.20 0.61 - 1.24 mg/dL   Calcium  8.1 (L) 8.9 - 10.3 mg/dL   GFR, Estimated >39 >39 mL/min   Anion gap 12 5 - 15  Glucose, capillary   Collection Time: 06/29/23  7:32 AM  Result Value Ref Range   Glucose-Capillary 116 (H) 70 - 99 mg/dL  Glucose, capillary   Collection Time: 06/29/23 11:43 AM  Result Value Ref Range   Glucose-Capillary 225 (H) 70 - 99 mg/dL  Glucose, capillary   Collection Time: 06/29/23  4:36 PM  Result Value Ref Range   Glucose-Capillary 173 (H) 70 - 99 mg/dL  Glucose, capillary   Collection Time: 06/29/23  9:23 PM  Result Value Ref Range   Glucose-Capillary 349 (H) 70 - 99 mg/dL  Glucose, capillary   Collection Time: 06/30/23 12:28 AM  Result Value Ref Range   Glucose-Capillary 186 (H) 70 - 99 mg/dL  Basic metabolic panel with GFR   Collection Time: 06/30/23  5:01 AM  Result Value Ref Range   Sodium 131 (L) 135 - 145 mmol/L   Potassium 3.3 (L) 3.5 - 5.1 mmol/L   Chloride 100 98 - 111 mmol/L   CO2 21 (L) 22 - 32 mmol/L   Glucose, Bld 196 (H) 70 - 99 mg/dL   BUN 16 8 - 23 mg/dL   Creatinine, Ser 9.27 0.61 - 1.24 mg/dL   Calcium  8.2 (L) 8.9 - 10.3 mg/dL   GFR, Estimated >39 >39 mL/min   Anion gap 10 5 - 15  Magnesium    Collection Time: 06/30/23  5:01 AM  Result Value Ref Range   Magnesium  2.2 1.7 - 2.4 mg/dL  Glucose, capillary   Collection Time: 06/30/23  8:16 AM  Result Value Ref Range   Glucose-Capillary 196 (H) 70 - 99 mg/dL  Glucose, capillary   Collection Time: 06/30/23 11:24 AM  Result Value Ref Range   Glucose-Capillary 237 (H) 70 - 99 mg/dL  Glucose, capillary   Collection Time: 06/30/23  4:20 PM  Result Value Ref Range   Glucose-Capillary 299 (H) 70 - 99 mg/dL      Assessment & Plan:   Problem List Items Addressed This Visit     Essential hypertension   Relevant Medications   amLODipine  (NORVASC ) 10 MG tablet    metoprolol  tartrate (LOPRESSOR ) 25 MG tablet   History of complete ray amputation of second toe of right foot (HCC)   History of complete ray amputation of third toe of right foot (HCC)   Major depressive disorder, recurrent, moderate (HCC)   Relevant Medications   escitalopram  (LEXAPRO ) 20 MG tablet   Primary osteoarthritis of knee   Relevant Medications   gabapentin  (NEURONTIN ) 100 MG capsule   Type 2 diabetes mellitus with other specified complication (HCC) - Primary   Relevant Medications   metFORMIN  (GLUCOPHAGE ) 500 MG tablet   Other Visit Diagnoses  Long-term current use of injectable noninsulin antidiabetic medication         History of complete ray amputation of second toe of left foot (HCC)            Type 2 Diabetes Mellitus A1c improved to 6.5%, indicating good control. Current regimen effective. Patient satisfied and anticipates further improvement. - Continue Trulicity  3 mg weekly, metformin  1500 mg daily, and Jardiance 25 mg daily. - Newest therapy with Jardiance 25mg  for past 2 months. - Schedule A1c recheck at the end of August 2025. - Monitor blood glucose levels regularly.  Hypertension Controlled on Amlodipine  10mg  daily, Metoprolol  25mg  TWICE A DAY   Peripheral Neuropathy S/p Toe amputation, multiple  General Health Maintenance Routine health maintenance discussed. Eye examination overdue. - Arrange for an eye examination and have results faxed to the office.        No orders of the defined types were placed in this encounter.   Meds ordered this encounter  Medications   amLODipine  (NORVASC ) 10 MG tablet    Sig: Take 1 tablet (10 mg total) by mouth daily.    Dispense:  90 tablet    Refill:  1   escitalopram  (LEXAPRO ) 20 MG tablet    Sig: Take 1 tablet (20 mg total) by mouth daily.    Dispense:  90 tablet    Refill:  1   gabapentin  (NEURONTIN ) 100 MG capsule    Sig: Take 1 capsule (100 mg total) by mouth 2 (two) times daily.    Dispense:   180 capsule    Refill:  1   metFORMIN  (GLUCOPHAGE ) 500 MG tablet    Sig: TAKE 2 TABLETS BY MOUTH ONCE EVERY MORNING WITH BREAKFAST AND 1 TABLET ONCEEVERY EVENING WITH DINNER    Dispense:  270 tablet    Refill:  1   metoprolol  tartrate (LOPRESSOR ) 25 MG tablet    Sig: Take 1 tablet (25 mg total) by mouth 2 (two) times daily.    Dispense:  180 tablet    Refill:  1    Follow up plan: Return in about 2 months (around 10/04/2023) for 2 month DM A1c.  Marsa Officer, DO [facility] 08/04/2023, 9:30 AM

## 2023-08-04 NOTE — Patient Instructions (Addendum)
 Thank you for coming to the office today.  Recent Labs    01/17/23 0917 06/28/23 1600  HGBA1C 7.9* 6.5*     Please schedule a Follow-up Appointment to: Return in about 2 months (around 10/04/2023) for 2 month DM A1c.  If you have any other questions or concerns, please feel free to call the office or send a message through MyChart. You may also schedule an earlier appointment if necessary.  Additionally, you may be receiving a survey about your experience at our office within a few days to 1 week by e-mail or mail. We value your feedback.  Marsa Officer, DO [facility], [place]

## 2023-08-26 NOTE — H&P (Signed)
 Chief Complaint: Right renal stone -image guided right percutaneous nephrostomy tube placement with OR to follow  Referring Provider(s): Carolee Beagle  Supervising Physician: Johann Sieving  Patient Status: Kevin Perry - Out-pt  History of Present Illness: Kevin Perry is a 73 y.o. male with history of anxiety, arthritis, coronary artery disease, diabetes mellitus, hypertension, and myocardial infarction.  Patient is followed by Dr. Ernest who was working him up for hematuria.  CT abdomen pelvis was obtained 06/28/2023 revealing 1.7 cm calculus in the right renal pelvis with surrounding inflammatory stranding in the right renal pelvis adipose tissue.  Patient was referred to Dr. Carolee of urology who decided stent placement was the best treatment plan.  Patient was referred to interventional radiology for image guided right percutaneous nephrostomy tube placement prior to the OR.   Patient is Full Code  Past Medical History:  Diagnosis Date   Anxiety    Arthritis    Cancer (HCC)    skin: head   Coronary artery disease    Diabetes mellitus without complication (HCC)    Dyspnea    History of kidney stones    Hypertension    Hypertensive retinopathy    Myocardial infarction [facility])    Pneumonia    Postoperative nausea 11/27/2019    Past Surgical History:  Procedure Laterality Date   BIOPSY  08/27/2022   Procedure: BIOPSY;  Surgeon: Maryruth Ole DASEN, MD;  Location: [facility];  Service: Endoscopy;;   CATARACT EXTRACTION     CHOLECYSTECTOMY     COLONOSCOPY WITH PROPOFOL  N/A 08/27/2022   Procedure: COLONOSCOPY WITH PROPOFOL ;  Surgeon: Maryruth Ole DASEN, MD;  Location: [facility];  Service: Endoscopy;  Laterality: N/A;   CORONARY ANGIOPLASTY WITH STENT PLACEMENT N/A 02/04/2006   ESOPHAGOGASTRODUODENOSCOPY (EGD) WITH PROPOFOL  N/A 08/27/2022   Procedure: ESOPHAGOGASTRODUODENOSCOPY (EGD) WITH PROPOFOL ;  Surgeon: Maryruth Ole DASEN, MD;  Location: [facility];  Service:  Endoscopy;  Laterality: N/A;   EYE SURGERY     FOOT SURGERY Left    DOS 8.1.14 HALLIX IPJ FUSION, SECOND MET OSTEOTOMY W/SCREW, HAM TOE REPAIR 2,,4 , PARTIAL AMP 3RD DIGIT    GALLBLADDER SURGERY     HEMOSTASIS CLIP PLACEMENT  08/27/2022   Procedure: HEMOSTASIS CLIP PLACEMENT;  Surgeon: Maryruth Ole DASEN, MD;  Location: [facility];  Service: Endoscopy;;   KNEE SURERY Right    LEFT HEART CATH AND CORONARY ANGIOGRAPHY Left 11/17/2019   Procedure: LEFT HEART CATH AND CORONARY ANGIOGRAPHY;  Surgeon: Bosie Vinie LABOR, MD;  Location: [facility];  Service: Cardiovascular;  Laterality: Left;   MYRINGOTOMY WITH TUBE PLACEMENT Right 10/25/2021   Procedure: MYRINGOTOMY WITH BUTTERFLY TUBE PLACEMENT;  Surgeon: Edda Mt, MD;  [facility];  Service: ENT;  Laterality: Right;   POLYPECTOMY  08/27/2022   Procedure: POLYPECTOMY;  Surgeon: Maryruth Ole DASEN, MD;  Location: [facility];  Service: Endoscopy;;   SUBMUCOSAL LIFTING INJECTION  08/27/2022   Procedure: SUBMUCOSAL LIFTING INJECTION;  Surgeon: Maryruth Ole DASEN, MD;  Location: [facility];  Service: Endoscopy;;  ELEVIEW ASCENDING COLON POLYP   TOTAL KNEE ARTHROPLASTY Right 08/30/2014   Procedure: TOTAL KNEE ARTHROPLASTY;  Surgeon: Ozell Flake, MD;  Location: [facility];  Service: Orthopedics;  Laterality: Right;    Allergies: Naproxen and Rosuvastatin   Medications: Prior to Admission medications   Medication Sig Start Date End Date Taking? Authorizing Provider  amLODipine  (NORVASC ) 10 MG tablet Take 1 tablet (10 mg total) by mouth daily. 08/04/23   [name], DO  aspirin  EC 81 MG tablet Take 81 mg by mouth daily. Swallow whole.    [provider]  baclofen  (LIORESAL ) 10 MG tablet Take 0.5-1 tablets (5-10 mg total) by mouth 3 (three) times daily as needed for muscle spasms. Patient not taking: Reported on 06/28/2023 12/24/22   [name], DO  cyanocobalamin  (VITAMIN B12)  1000 MCG tablet Take 1 tablet (1,000 mcg total) by mouth daily. Patient not taking: Reported on 06/28/2023 01/20/23   [name], DO  empagliflozin (JARDIANCE) 25 MG TABS tablet Take 25 mg by mouth daily. Patient not taking: Reported on 08/25/2023 02/03/23 02/03/24  [provider]  escitalopram  (LEXAPRO ) 20 MG tablet Take 1 tablet (20 mg total) by mouth daily. 08/04/23   Karamalegos, Marsa PARAS, DO  folic acid  (FOLVITE ) 1 MG tablet Take 1 mg by mouth See admin instructions. Take 1 mg daily except skip dose on Wednesdays (methotrexate day) 08/27/16   [provider]  furosemide  (LASIX ) 40 MG tablet TAKE 1 TABLET BY MOUTH ONCE DAILY 08/01/22   Edman, Marsa PARAS, DO  gabapentin  (NEURONTIN ) 100 MG capsule Take 1 capsule (100 mg total) by mouth 2 (two) times daily. 08/04/23   Karamalegos, Marsa PARAS, DO  lisinopril  (ZESTRIL ) 20 MG tablet Take 1 tablet by mouth 2 (two) times daily. 10/29/21   [provider]  loratadine  (CLARITIN ) 10 MG tablet Take 10 mg by mouth every morning.    [provider]  metFORMIN  (GLUCOPHAGE ) 500 MG tablet TAKE 2 TABLETS BY MOUTH ONCE EVERY MORNING WITH BREAKFAST AND 1 TABLET ONCEEVERY EVENING WITH DINNER 08/04/23   Karamalegos, Marsa PARAS, DO  methotrexate (RHEUMATREX) 2.5 MG tablet Take 10 mg by mouth once a week. On Wednesdays 06/05/22   [provider]  metoprolol  tartrate (LOPRESSOR ) 25 MG tablet Take 1 tablet (25 mg total) by mouth 2 (two) times daily. 08/04/23   Karamalegos, Marsa PARAS, DO  nitroGLYCERIN  (NITROSTAT ) 0.4 MG SL tablet Place 0.4 mg under the tongue every 5 (five) minutes as needed for chest pain. 11/18/19   [provider]  REPATHA  SURECLICK 140 MG/ML SOAJ Inject 140 mg as directed every 14 (fourteen) days. 07/10/23   Karamalegos, Marsa PARAS, DO  TRULICITY  3 MG/0.5ML SOPN INJECT 3 MG (0.5 ML) UNDER THE SKIN ONCE A WEEK 12/12/21   Karamalegos, Marsa PARAS, DO     Family History  Problem  Relation Age of Onset   Heart attack Mother    Diabetes Father    Hypertension Father    Heart attack Sister     Social History   Socioeconomic History   Marital status: Married    Spouse name: Not on file   Number of children: Not on file   Years of education: Not on file   Highest education level: Bachelor's degree (e.g., BA, AB, BS)  Occupational History   Not on file  Tobacco Use   Smoking status: Former    Current packs/day: 0.00    Types: Cigarettes    Quit date: 08/16/2004    Years since quitting: 19.0   Smokeless tobacco: Former    Quit date: 2006   Tobacco comments:    quit 20 plus years  Vaping Use   Vaping status: Never Used  Substance and Sexual Activity   Alcohol use: Not Currently    Alcohol/week: 2.0 standard drinks of alcohol    Types: 2 Cans of beer per week    Comment: SOCIAL   Drug use: No   Sexual activity: Not  Currently  Other Topics Concern   Not on file  Social History Narrative   Not on file   Social Drivers of Health   Financial Resource Strain: Low Risk  (07/31/2023)   Overall Financial Resource Strain (CARDIA)    Difficulty of Paying Living Expenses: Not very hard  Food Insecurity: No Food Insecurity (07/31/2023)   Hunger Vital Sign    Worried About Running Out of Food in the Last Year: Never true    Ran Out of Food in the Last Year: Never true  Transportation Needs: No Transportation Needs (07/31/2023)   PRAPARE - Administrator, Civil Service (Medical): No    Lack of Transportation (Non-Medical): No  Physical Activity: Sufficiently Active (07/31/2023)   Exercise Vital Sign    Days of Exercise per Week: 3 days    Minutes of Exercise per Session: 60 min  Stress: No Stress Concern Present (07/31/2023)   [employer] of Occupational Health - Occupational Stress Questionnaire    Feeling of Stress: Not at all  Social Connections: Unknown (07/31/2023)   Social Connection and Isolation Panel    Frequency of Communication  with Friends and Family: More than three times a week    Frequency of Social Gatherings with Friends and Family: Once a week    Attends Religious Services: More than 4 times per year    Active Member of [employer] or Organizations: Yes    Attends Engineer, structural: More than 4 times per year    Marital Status: Not on file     Review of Systems: A 12 point ROS discussed and pertinent positives are indicated in the HPI above.  All other systems are negative.  Review of Systems  Constitutional:  Negative for fatigue and fever.  HENT:  Negative for congestion and dental problem.   Respiratory:  Negative for cough, chest tightness, shortness of breath and wheezing.   Cardiovascular:  Negative for chest pain.  Gastrointestinal:  Negative for diarrhea, nausea and vomiting.  Neurological:  Negative for light-headedness and headaches.  Psychiatric/Behavioral:  Negative for agitation, behavioral problems and confusion.     Vital Signs: BP 138/80 (BP Location: Right Arm)   Pulse 60   Temp 98.2 F (36.8 C) (Oral)   Resp 16   SpO2 97%   Advance Care Plan: The advanced care place/surrogate decision maker was discussed at the time of visit and the patient did not wish to discuss or was not able to name a surrogate decision maker or provide an advance care plan.  Physical Exam Constitutional:      Appearance: Normal appearance.  HENT:     Head: Normocephalic and atraumatic.     Mouth/Throat:     Mouth: Mucous membranes are moist.  Cardiovascular:     Rate and Rhythm: Normal rate and regular rhythm.  Pulmonary:     Effort: Pulmonary effort is normal.     Breath sounds: Normal breath sounds.  Abdominal:     General: Bowel sounds are normal.     Palpations: Abdomen is soft.  Musculoskeletal:        General: Normal range of motion.     Cervical back: Normal range of motion.  Skin:    General: Skin is warm and dry.  Neurological:     Mental Status: He is alert and oriented to  person, place, and time.  Psychiatric:        Mood and Affect: Mood normal.  Behavior: Behavior normal.     Imaging: No results found.  Labs:  CBC: Recent Labs    06/28/23 0944 06/29/23 0504 08/27/23 1135 09/01/23 0809  WBC 9.3 7.4 5.7 5.4  HGB 16.3 14.3 16.8 16.4  HCT 46.4 40.9 48.4 46.7  PLT 160 143* 151 157    COAGS: Recent Labs    06/28/23 0944 09/01/23 0809  INR 1.2 1.0    BMP: Recent Labs    06/29/23 0504 06/30/23 0501 08/27/23 1135 09/01/23 0809  NA 137 131* 135 137  K 3.5 3.3* 3.8 3.6  CL 102 100 100 102  CO2 23 21* 24 24  GLUCOSE 127* 196* 161* 164*  BUN 15 16 19 17   CALCIUM  8.1* 8.2* 9.2 9.0  CREATININE 0.79 0.72 0.85 0.94  GFRNONAA >60 >60 >60 >60    LIVER FUNCTION TESTS: Recent Labs    01/17/23 0917 06/28/23 0944  BILITOT 0.9 2.8*  AST 24 17  ALT 27 19  ALKPHOS  --  36*  PROT 6.6 6.8  ALBUMIN  --  3.8    TUMOR MARKERS: No results for input(s): AFPTM, CEA, CA199, CHROMGRNA in the last 8760 hours.  Assessment and Plan:  Patient is with right renal stone scheduled for image guided right percutaneous nephrostomy tube placement with OR to follow 09/01/2023.  Risks and benefits of right PCN placement was discussed with the patient including, but not limited to, infection, bleeding, significant bleeding causing loss or decrease in renal function or damage to adjacent structures.   All of the patient's questions were answered, patient is agreeable to proceed.  Consent signed and in chart.  Thank you for allowing our service to participate in TITO AUSMUS 's care.  Electronically Signed: Lavanda JAYSON Jurist, PA-C   09/01/2023, 9:00 AM    I spent a total of  30 Minutes   in face to face in clinical consultation, greater than 50% of which was counseling/coordinating care for right percutaneous nephrostomy tube placement.

## 2023-08-26 NOTE — Patient Instructions (Addendum)
 SURGICAL WAITING ROOM VISITATION Patients having surgery or a procedure may have no more than 2 support people in the waiting area - these visitors may rotate in the visitor waiting room.   Due to an increase in RSV and influenza rates and associated hospitalizations, children ages 40 and under may not visit patients in [facility] hospitals. If the patient needs to stay at the hospital during part of their recovery, the visitor guidelines for inpatient rooms apply.  PRE-OP VISITATION  Pre-op nurse will coordinate an appropriate time for 1 support person to accompany the patient in pre-op.  This support person may not rotate.  This visitor will be contacted when the time is appropriate for the visitor to come back in the pre-op area.  Please refer to the [facility] website for the visitor guidelines for Inpatients (after your surgery is over and you are in a regular room).  You are not required to quarantine at this time prior to your surgery. However, you must do this: Hand Hygiene often Do NOT share personal items Notify your provider if you are in close contact with someone who has COVID or you develop fever 100.4 or greater, new onset of sneezing, cough, sore throat, shortness of breath or body aches.  If you test positive for Covid or have been in contact with anyone that has tested positive in the last 10 days please notify you surgeon.    Your procedure is scheduled on:  09/01/23  Report to [facility] Main Entrance: [place] entrance where the [employer] is available.   Report to admitting at: 8:45 AM  Call this number if you have any questions or problems the morning of surgery [phone]  FOLLOW ANY ADDITIONAL PRE OP INSTRUCTIONS YOU RECEIVED FROM YOUR SURGEON'S OFFICE!!!  Do not eat food or drink fluids after Midnight the night prior to your surgery/procedure.   Oral Hygiene is also important to reduce your risk of infection.        Remember - BRUSH YOUR TEETH  THE MORNING OF SURGERY WITH YOUR REGULAR TOOTHPASTE  Do NOT smoke after Midnight the night before surgery.  STOP TAKING all Vitamins, Herbs and supplements 1 week before your surgery.   Take ONLY these medicines the morning of surgery with A SIP OF WATER: gabapentin ,escitalopram ,loratadine ,metoprolol ,amlodipine .  If You have been diagnosed with Sleep Apnea - Bring CPAP mask and tubing day of surgery. We will provide you with a CPAP machine on the day of your surgery.                   You may not have any metal on your body including hair pins, jewelry, and body piercing  Do not wear lotions, powders, perfumes / cologne, or deodorant  Men may shave face and neck.  Contacts, Hearing Aids, dentures or bridgework may not be worn into surgery. DENTURES WILL BE REMOVED PRIOR TO SURGERY PLEASE DO NOT APPLY Poly grip OR ADHESIVES!!!  You may bring a small overnight bag with you on the day of surgery, only pack items that are not valuable. [facility] IS NOT RESPONSIBLE   FOR VALUABLES THAT ARE LOST OR STOLEN.   Patients discharged on the day of surgery will not be allowed to drive home.  Someone NEEDS to stay with you for the first 24 hours after anesthesia.  Do not bring your home medications to the hospital. The Pharmacy will dispense medications listed on your medication list to you during your admission in the  Hospital.  Special Instructions: Bring a copy of your healthcare power of attorney and living will documents the day of surgery, if you wish to have them scanned into your [facility] Records- EPIC  Please read over the following fact sheets you were given: IF YOU HAVE QUESTIONS ABOUT YOUR PRE-OP INSTRUCTIONS, PLEASE CALL [phone]   [facility] - Preparing for Surgery Before surgery, you can play an important role.  Because skin is not sterile, your skin needs to be as free of germs as possible.  You can reduce the number of germs on your skin by washing with CHG  (chlorahexidine gluconate) soap before surgery.  CHG is an antiseptic cleaner which kills germs and bonds with the skin to continue killing germs even after washing. Please DO NOT use if you have an allergy to CHG or antibacterial soaps.  If your skin becomes reddened/irritated stop using the CHG and inform your nurse when you arrive at Short Stay. Do not shave (including legs and underarms) for at least 48 hours prior to the first CHG shower.  You may shave your face/neck.  Please follow these instructions carefully:  1.  Shower with CHG Soap the night before surgery and the  morning of surgery.  2.  If you choose to wash your hair, wash your hair first as usual with your normal  shampoo.  3.  After you shampoo, rinse your hair and body thoroughly to remove the shampoo.                             4.  Use CHG as you would any other liquid soap.  You can apply chg directly to the skin and wash.  Gently with a scrungie or clean washcloth.  5.  Apply the CHG Soap to your body ONLY FROM THE NECK DOWN.   Do not use on face/ open                           Wound or open sores. Avoid contact with eyes, ears mouth and genitals (private parts).                       Wash face,  Genitals (private parts) with your normal soap.             6.  Wash thoroughly, paying special attention to the area where your  surgery  will be performed.  7.  Thoroughly rinse your body with warm water from the neck down.  8.  DO NOT shower/wash with your normal soap after using and rinsing off the CHG Soap.            9.  Pat yourself dry with a clean towel.            10.  Wear clean pajamas.            11.  Place clean sheets on your bed the night of your first shower and do not  sleep with pets.  ON THE DAY OF SURGERY : Do not apply any lotions/deodorants the morning of surgery.  Please wear clean clothes to the hospital/surgery center.     FAILURE TO FOLLOW THESE INSTRUCTIONS MAY RESULT IN THE CANCELLATION OF YOUR  SURGERY  PATIENT SIGNATURE_________________________________  NURSE SIGNATURE__________________________________  ________________________________________________________________________

## 2023-08-27 ENCOUNTER — Encounter (HOSPITAL_COMMUNITY): Payer: Self-pay

## 2023-08-27 ENCOUNTER — Other Ambulatory Visit: Payer: Self-pay

## 2023-08-27 ENCOUNTER — Encounter (HOSPITAL_COMMUNITY)
Admission: RE | Admit: 2023-08-27 | Discharge: 2023-08-27 | Disposition: A | Discharge status: Home / Self Care | Source: Ambulatory Visit | Attending: Urology | Admitting: Urology

## 2023-08-27 VITALS — BP 125/70 | HR 61 | Temp 98.2°F | Ht 75.0 in | Wt 249.0 lb

## 2023-08-27 DIAGNOSIS — Z951 Presence of aortocoronary bypass graft: Secondary | ICD-10-CM | POA: Insufficient documentation

## 2023-08-27 DIAGNOSIS — Z89422 Acquired absence of other left toe(s): Secondary | ICD-10-CM | POA: Diagnosis not present

## 2023-08-27 DIAGNOSIS — I10 Essential (primary) hypertension: Secondary | ICD-10-CM | POA: Diagnosis not present

## 2023-08-27 DIAGNOSIS — Z7984 Long term (current) use of oral hypoglycemic drugs: Secondary | ICD-10-CM | POA: Insufficient documentation

## 2023-08-27 DIAGNOSIS — Z01818 Encounter for other preprocedural examination: Secondary | ICD-10-CM | POA: Diagnosis present

## 2023-08-27 DIAGNOSIS — N200 Calculus of kidney: Secondary | ICD-10-CM | POA: Insufficient documentation

## 2023-08-27 DIAGNOSIS — I2510 Atherosclerotic heart disease of native coronary artery without angina pectoris: Secondary | ICD-10-CM | POA: Insufficient documentation

## 2023-08-27 DIAGNOSIS — Z87442 Personal history of urinary calculi: Secondary | ICD-10-CM | POA: Diagnosis not present

## 2023-08-27 DIAGNOSIS — L97526 Non-pressure chronic ulcer of other part of left foot with bone involvement without evidence of necrosis: Secondary | ICD-10-CM | POA: Insufficient documentation

## 2023-08-27 DIAGNOSIS — I340 Nonrheumatic mitral (valve) insufficiency: Secondary | ICD-10-CM | POA: Insufficient documentation

## 2023-08-27 DIAGNOSIS — Z794 Long term (current) use of insulin: Secondary | ICD-10-CM | POA: Diagnosis not present

## 2023-08-27 DIAGNOSIS — Z01812 Encounter for preprocedural laboratory examination: Secondary | ICD-10-CM | POA: Insufficient documentation

## 2023-08-27 DIAGNOSIS — E11621 Type 2 diabetes mellitus with foot ulcer: Secondary | ICD-10-CM | POA: Insufficient documentation

## 2023-08-27 DIAGNOSIS — Z87891 Personal history of nicotine dependence: Secondary | ICD-10-CM | POA: Diagnosis not present

## 2023-08-27 DIAGNOSIS — I252 Old myocardial infarction: Secondary | ICD-10-CM | POA: Insufficient documentation

## 2023-08-27 HISTORY — DX: Personal history of urinary calculi: Z87.442

## 2023-08-27 HISTORY — DX: Dyspnea, unspecified: R06.00

## 2023-08-27 HISTORY — DX: Pneumonia, unspecified organism: J18.9

## 2023-08-27 HISTORY — DX: Malignant (primary) neoplasm, unspecified: C80.1

## 2023-08-27 LAB — BASIC METABOLIC PANEL WITH GFR
Anion gap: 11 (ref 5–15)
BUN: 19 mg/dL (ref 8–23)
CO2: 24 mmol/L (ref 22–32)
Calcium: 9.2 mg/dL (ref 8.9–10.3)
Chloride: 100 mmol/L (ref 98–111)
Creatinine, Ser: 0.85 mg/dL (ref 0.61–1.24)
GFR, Estimated: >60 mL/min (ref >60)
Glucose, Bld: 161 mg/dL — ABNORMAL HIGH (ref 70–99)
Potassium: 3.8 mmol/L (ref 3.5–5.1)
Sodium: 135 mmol/L (ref 135–145)

## 2023-08-27 LAB — CBC
HCT: 48.4 % (ref 39.0–52.0)
Hemoglobin: 16.8 g/dL (ref 13.0–17.0)
MCH: 34 pg (ref 26.0–34.0)
MCHC: 34.7 g/dL (ref 30.0–36.0)
MCV: 98 fL (ref 80.0–100.0)
Platelets: 151 K/uL (ref 150–400)
RBC: 4.94 MIL/uL (ref 4.22–5.81)
RDW: 14.5 % (ref 11.5–15.5)
WBC: 5.7 K/uL (ref 4.0–10.5)
nRBC: 0 % (ref 0.0–0.2)

## 2023-08-27 LAB — GLUCOSE, CAPILLARY: Glucose-Capillary: 182 mg/dL — ABNORMAL HIGH (ref 70–99)

## 2023-08-27 NOTE — Progress Notes (Signed)
/  For Anesthesia: PCP - [name], DO LOV: 08/04/23 Cardiologist - Annalee Custovic, DO LOV: 05/19/23  Bowel Prep reminder:  Chest x-ray - 06/28/23 EKG - 07/01/23 Stress T/est -  ECHO - 10/02/20: [facility] Cardiac Cath - 11/17/19 Pacemaker/ICD device last checked: Pacemaker orders received: Device Rep notified:  Spinal Cord Stimulator:N/A  Sleep Study - Yes CPAP - NO  Fasting Blood Sugar - 140's - 160's Checks Blood Sugar __2___ times a week. Date and result of last Hgb A1c-  Last dose of GLP1 agonist- Trulicity : Last dose: 08/25/23 GLP1 instructions:   Last dose of SGLT-2 inhibitors- N/A SGLT-2 instructions:   Blood Thinner Instructions: Aspirin  Instructions: Pt. Will check with surgeon about baby aspirin . Last Dose:  Activity level: Unable to go up a flight of stairs without shortness of breath    Anesthesia review: Hx: CAD,MI,HTN,DIA,CABG,SOB(with exertion),OSA(NO CPAP).  Patient denies shortness of breath, fever, cough and chest pain at PAT appointment   Patient verbalized understanding of instructions that were reviewed over the telephone.

## 2023-08-28 ENCOUNTER — Encounter (HOSPITAL_COMMUNITY): Payer: Self-pay

## 2023-08-28 NOTE — Progress Notes (Signed)
 Case: [id number] Date/Time: 09/01/23 1046   Procedure: NEPHROLITHOTOMY PERCUTANEOUS (Right)   Anesthesia type: General   Diagnosis: Calculus of kidney [N20.0]   Pre-op diagnosis: RIGHT RENAL STONE   Location: [facility] ROOM 01 / [facility]   Surgeons: Carolee Sherwood JONETTA Lamichael, MD       DISCUSSION: Kevin Perry is a 73 yo male who presents to PAT prior to surgery above. PMH of former smoking, HTN, hx of MI, CAD s/p CABG x 4 (in 2021), mild-mod mitral regurgitation, PAF, DM (A1c 6.5), kidney stones, arthritis, osteomyelitis s/p left 2nd toe amputation (03/2023)  Prior anesthesia complications include PONV  Patient follows with Cardiology at [facility] for above hx. Also has a history of postoperative atrial fibrillation with negative Holter monitor.  He is no longer on anticoagulation. Last seen on 05/22/23 in clinic. Noted to be doing well from cardiac standpoint. Last echo 09/2020 with normal biventricular systolic function, mild to moderate MR. Advised f/u in 6 months.  Patient admitted from 5/24-5/26 for acute respiratory failure 2/2 CAP. Treated with antibiotics   Seen by PCP on 08/04/23. All symptoms resolved. DM controlled. A1c 6.5 in May 2025. BP controlled.     VS: BP 125/70   Pulse 61   Temp 36.8 C (Oral)   Ht 6' 3 (1.905 m)   Wt 112.9 kg   SpO2 96%   BMI 31.12 kg/m   PROVIDERS: [name], DO   LABS: Labs reviewed: Acceptable for surgery. (all labs ordered are listed, but only abnormal results are displayed)  Labs Reviewed  BASIC METABOLIC PANEL WITH GFR - Abnormal; Notable for the following components:      Result Value   Glucose, Bld 161 (*)    All other components within normal limits  GLUCOSE, CAPILLARY - Abnormal; Notable for the following components:   Glucose-Capillary 182 (*)    All other components within normal limits  CBC  TYPE AND SCREEN     IMAGES: CTA Chest 06/28/23:  IMPRESSION: 1. No filling defect is identified in the pulmonary arterial tree  to suggest pulmonary embolus. 2. Right lower lobe pneumonia with a component of alveolitis. 3. Persistent 1.7 cm calculus in the right renal pelvis with surrounding inflammatory stranding in the renal pelvis adipose tissue. No overt hydronephrosis. 4. Stable nonobstructive 1.2 cm right kidney lower pole calculus. 5. Sigmoid colon diverticulosis. 6. Multilevel lumbar impingement. 7. Aortic Atherosclerosis (ICD10-I70.0) and Emphysema (ICD10-J43.9).  EKG 06/28/23:  Sinus rhythm Atrial premature complexes in couplets Nonspecific repol abnormality, diffuse leads  CV: Echo 10/02/2020 ([facility]):  INTERPRETATION NORMAL LEFT VENTRICULAR SYSTOLIC FUNCTION   WITH MILD LVH NORMAL RIGHT VENTRICULAR SYSTOLIC FUNCTION MILD VALVULAR REGURGITATION (See above) NO VALVULAR STENOSIS ESTIMATED LVEF >55% Aortic: NORMAL GRADIENTS Mitral: MILD - MODERATE MR Tricuspid: MILD TR (2.55m/s) Pulmonic: MILD PI MILDLY DILATED ASCENDING AORTA MEASURING 3.5cm MODERATE LAE MILD RAE Past Medical History:  Diagnosis Date   Anxiety    Arthritis    Cancer (HCC)    skin: head   Coronary artery disease    Diabetes mellitus without complication (HCC)    Dyspnea    History of kidney stones    Hypertension    Hypertensive retinopathy    Myocardial infarction [facility])    Pneumonia    Postoperative nausea 11/27/2019    Past Surgical History:  Procedure Laterality Date   BIOPSY  08/27/2022   Procedure: BIOPSY;  Surgeon: Maryruth Ole DASEN, MD;  Location: [facility] ENDOSCOPY;  Service: Endoscopy;;   CATARACT EXTRACTION  CHOLECYSTECTOMY     COLONOSCOPY WITH PROPOFOL  N/A 08/27/2022   Procedure: COLONOSCOPY WITH PROPOFOL ;  Surgeon: Maryruth Ole DASEN, MD;  Location: [facility] ENDOSCOPY;  Service: Endoscopy;  Laterality: N/A;   CORONARY ANGIOPLASTY WITH STENT PLACEMENT N/A 02/04/2006   ESOPHAGOGASTRODUODENOSCOPY (EGD) WITH PROPOFOL  N/A 08/27/2022   Procedure: ESOPHAGOGASTRODUODENOSCOPY (EGD) WITH PROPOFOL ;  Surgeon:  Maryruth Ole DASEN, MD;  Location: [facility] ENDOSCOPY;  Service: Endoscopy;  Laterality: N/A;   EYE SURGERY     FOOT SURGERY Left    DOS 8.1.14 HALLIX IPJ FUSION, SECOND MET OSTEOTOMY W/SCREW, HAM TOE REPAIR 2,,4 , PARTIAL AMP 3RD DIGIT    GALLBLADDER SURGERY     HEMOSTASIS CLIP PLACEMENT  08/27/2022   Procedure: HEMOSTASIS CLIP PLACEMENT;  Surgeon: Maryruth Ole DASEN, MD;  Location: [facility] ENDOSCOPY;  Service: Endoscopy;;   KNEE SURERY Right    LEFT HEART CATH AND CORONARY ANGIOGRAPHY Left 11/17/2019   Procedure: LEFT HEART CATH AND CORONARY ANGIOGRAPHY;  Surgeon: Bosie Vinie LABOR, MD;  Location: [facility] INVASIVE CV LAB;  Service: Cardiovascular;  Laterality: Left;   MYRINGOTOMY WITH TUBE PLACEMENT Right 10/25/2021   Procedure: MYRINGOTOMY WITH BUTTERFLY TUBE PLACEMENT;  Surgeon: Edda Mt, MD;  Location: [facility];  Service: ENT;  Laterality: Right;   POLYPECTOMY  08/27/2022   Procedure: POLYPECTOMY;  Surgeon: Maryruth Ole DASEN, MD;  Location: [facility] ENDOSCOPY;  Service: Endoscopy;;   SUBMUCOSAL LIFTING INJECTION  08/27/2022   Procedure: SUBMUCOSAL LIFTING INJECTION;  Surgeon: Maryruth Ole DASEN, MD;  Location: [facility] ENDOSCOPY;  Service: Endoscopy;;  ELEVIEW ASCENDING COLON POLYP   TOTAL KNEE ARTHROPLASTY Right 08/30/2014   Procedure: TOTAL KNEE ARTHROPLASTY;  Surgeon: Ozell Flake, MD;  Location: [facility] ORS;  Service: Orthopedics;  Laterality: Right;    MEDICATIONS:  amLODipine  (NORVASC ) 10 MG tablet   aspirin  EC 81 MG tablet   baclofen  (LIORESAL ) 10 MG tablet   cyanocobalamin  (VITAMIN B12) 1000 MCG tablet   empagliflozin (JARDIANCE) 25 MG TABS tablet   escitalopram  (LEXAPRO ) 20 MG tablet   folic acid  (FOLVITE ) 1 MG tablet   furosemide  (LASIX ) 40 MG tablet   gabapentin  (NEURONTIN ) 100 MG capsule   lisinopril  (ZESTRIL ) 20 MG tablet   loratadine  (CLARITIN ) 10 MG tablet   metFORMIN  (GLUCOPHAGE ) 500 MG tablet   methotrexate (RHEUMATREX) 2.5 MG tablet   metoprolol  tartrate (LOPRESSOR )  25 MG tablet   nitroGLYCERIN  (NITROSTAT ) 0.4 MG SL tablet   REPATHA  SURECLICK 140 MG/ML SOAJ   TRULICITY  3 MG/0.5ML SOPN   No current facility-administered medications for this encounter.

## 2023-08-29 ENCOUNTER — Other Ambulatory Visit: Payer: Self-pay | Admitting: Radiology

## 2023-08-29 DIAGNOSIS — N200 Calculus of kidney: Secondary | ICD-10-CM

## 2023-09-01 ENCOUNTER — Ambulatory Visit (HOSPITAL_COMMUNITY)

## 2023-09-01 ENCOUNTER — Encounter (HOSPITAL_COMMUNITY): Payer: Self-pay

## 2023-09-01 ENCOUNTER — Ambulatory Visit (HOSPITAL_COMMUNITY)
Admission: RE | Admit: 2023-09-01 | Discharge: 2023-09-01 | Disposition: A | Discharge status: Home / Self Care | Source: Ambulatory Visit | Attending: Urology | Admitting: Urology

## 2023-09-01 ENCOUNTER — Observation Stay (HOSPITAL_COMMUNITY)
Admission: RE | Admit: 2023-09-01 | Discharge: 2023-09-02 | Disposition: A | Discharge status: Home / Self Care | Attending: Urology | Admitting: Urology

## 2023-09-01 ENCOUNTER — Ambulatory Visit (HOSPITAL_COMMUNITY): Payer: Self-pay | Admitting: Medical

## 2023-09-01 ENCOUNTER — Other Ambulatory Visit: Payer: Self-pay

## 2023-09-01 ENCOUNTER — Ambulatory Visit (HOSPITAL_COMMUNITY): Payer: Self-pay | Admitting: Physician Assistant

## 2023-09-01 ENCOUNTER — Encounter (HOSPITAL_COMMUNITY)
Admission: RE | Disposition: A | Discharge status: Home / Self Care | Payer: Self-pay | Source: Home / Self Care | Attending: Urology

## 2023-09-01 ENCOUNTER — Encounter (HOSPITAL_COMMUNITY): Payer: Self-pay | Admitting: Urology

## 2023-09-01 DIAGNOSIS — M1990 Unspecified osteoarthritis, unspecified site: Secondary | ICD-10-CM | POA: Insufficient documentation

## 2023-09-01 DIAGNOSIS — N200 Calculus of kidney: Principal | ICD-10-CM | POA: Insufficient documentation

## 2023-09-01 DIAGNOSIS — I252 Old myocardial infarction: Secondary | ICD-10-CM | POA: Diagnosis not present

## 2023-09-01 DIAGNOSIS — I2510 Atherosclerotic heart disease of native coronary artery without angina pectoris: Secondary | ICD-10-CM

## 2023-09-01 DIAGNOSIS — I10 Essential (primary) hypertension: Secondary | ICD-10-CM | POA: Insufficient documentation

## 2023-09-01 DIAGNOSIS — E119 Type 2 diabetes mellitus without complications: Secondary | ICD-10-CM | POA: Diagnosis not present

## 2023-09-01 DIAGNOSIS — R3121 Asymptomatic microscopic hematuria: Secondary | ICD-10-CM | POA: Insufficient documentation

## 2023-09-01 DIAGNOSIS — Z7982 Long term (current) use of aspirin: Secondary | ICD-10-CM | POA: Insufficient documentation

## 2023-09-01 DIAGNOSIS — Z87891 Personal history of nicotine dependence: Secondary | ICD-10-CM | POA: Diagnosis not present

## 2023-09-01 HISTORY — PX: IR URETERAL STENT RIGHT NEW ACCESS W/O SEP NEPHROSTOMY CATH: IMG6076

## 2023-09-01 HISTORY — PX: NEPHROLITHOTOMY: SHX5134

## 2023-09-01 LAB — TYPE AND SCREEN
ABO/RH(D): A POS
Antibody Screen: NEGATIVE

## 2023-09-01 LAB — CBC WITH DIFFERENTIAL/PLATELET
Abs Immature Granulocytes: 0.02 K/uL (ref 0.00–0.07)
Basophils Absolute: 0.1 K/uL (ref 0.0–0.1)
Basophils Relative: 1 %
Eosinophils Absolute: 0.2 K/uL (ref 0.0–0.5)
Eosinophils Relative: 4 %
HCT: 46.7 % (ref 39.0–52.0)
Hemoglobin: 16.4 g/dL (ref 13.0–17.0)
Immature Granulocytes: 0 %
Lymphocytes Relative: 37 %
Lymphs Abs: 2 K/uL (ref 0.7–4.0)
MCH: 34.4 pg — ABNORMAL HIGH (ref 26.0–34.0)
MCHC: 35.1 g/dL (ref 30.0–36.0)
MCV: 97.9 fL (ref 80.0–100.0)
Monocytes Absolute: 0.5 K/uL (ref 0.1–1.0)
Monocytes Relative: 9 %
Neutro Abs: 2.6 K/uL (ref 1.7–7.7)
Neutrophils Relative %: 49 %
Platelets: 157 K/uL (ref 150–400)
RBC: 4.77 MIL/uL (ref 4.22–5.81)
RDW: 14.3 % (ref 11.5–15.5)
WBC: 5.4 K/uL (ref 4.0–10.5)
nRBC: 0 % (ref 0.0–0.2)

## 2023-09-01 LAB — GLUCOSE, CAPILLARY
Glucose-Capillary: 166 mg/dL — ABNORMAL HIGH (ref 70–99)
Glucose-Capillary: 164 mg/dL — ABNORMAL HIGH (ref 70–99)
Glucose-Capillary: 206 mg/dL — ABNORMAL HIGH (ref 70–99)
Glucose-Capillary: 187 mg/dL — ABNORMAL HIGH (ref 70–99)

## 2023-09-01 LAB — BASIC METABOLIC PANEL WITH GFR
Anion gap: 11 (ref 5–15)
BUN: 17 mg/dL (ref 8–23)
CO2: 24 mmol/L (ref 22–32)
Calcium: 9 mg/dL (ref 8.9–10.3)
Chloride: 102 mmol/L (ref 98–111)
Creatinine, Ser: 0.94 mg/dL (ref 0.61–1.24)
GFR, Estimated: >60 mL/min (ref >60)
Glucose, Bld: 164 mg/dL — ABNORMAL HIGH (ref 70–99)
Potassium: 3.6 mmol/L (ref 3.5–5.1)
Sodium: 137 mmol/L (ref 135–145)

## 2023-09-01 LAB — PROTIME-INR
INR: 1 (ref 0.8–1.2)
Prothrombin Time: 13.6 s (ref 11.4–15.2)

## 2023-09-01 LAB — HEMOGLOBIN AND HEMATOCRIT, BLOOD
HCT: 47 % (ref 39.0–52.0)
Hemoglobin: 16.1 g/dL (ref 13.0–17.0)

## 2023-09-01 SURGERY — NEPHROLITHOTOMY PERCUTANEOUS
Anesthesia: General | Laterality: Right

## 2023-09-01 MED ORDER — LISINOPRIL 20 MG PO TABS
20.0000 mg | ORAL_TABLET | Freq: Two times a day (BID) | ORAL | Status: DC
  Administered 2023-09-01 – 2023-09-02 (×2): 20 mg via ORAL
  Filled 2023-09-01 (×2): qty 1

## 2023-09-01 MED ORDER — EPHEDRINE SULFATE (PRESSORS) 50 MG/ML IJ SOLN
INTRAMUSCULAR | Status: DC | PRN
Start: 2023-09-01 — End: 2023-09-01
  Administered 2023-09-01: 5 mg via INTRAVENOUS

## 2023-09-01 MED ORDER — LIDOCAINE-EPINEPHRINE 1 %-1:100000 IJ SOLN
20.0000 mL | Freq: Once | INTRAMUSCULAR | Status: AC
  Administered 2023-09-01: 9 mL via INTRADERMAL

## 2023-09-01 MED ORDER — DIPHENHYDRAMINE HCL 12.5 MG/5ML PO ELIX: 12.5000 mg | ORAL_SOLUTION | Freq: Four times a day (QID) | ORAL | Status: DC | PRN

## 2023-09-01 MED ORDER — LACTATED RINGERS IV SOLN: INTRAVENOUS | Status: DC

## 2023-09-01 MED ORDER — ONDANSETRON HCL 4 MG/2ML IJ SOLN
INTRAMUSCULAR | Status: AC
  Filled 2023-09-01: qty 2

## 2023-09-01 MED ORDER — DOCUSATE SODIUM 100 MG PO CAPS
100.0000 mg | ORAL_CAPSULE | Freq: Two times a day (BID) | ORAL | Status: DC
  Filled 2023-09-01: qty 1

## 2023-09-01 MED ORDER — SUGAMMADEX SODIUM 200 MG/2ML IV SOLN
INTRAVENOUS | Status: DC | PRN
  Administered 2023-09-01: 50 mg via INTRAVENOUS
  Administered 2023-09-01: 200 mg via INTRAVENOUS

## 2023-09-01 MED ORDER — ZOLPIDEM TARTRATE 5 MG PO TABS
5.0000 mg | ORAL_TABLET | Freq: Every evening | ORAL | Status: DC | PRN
  Administered 2023-09-01: 5 mg via ORAL
  Filled 2023-09-01: qty 1

## 2023-09-01 MED ORDER — PROPOFOL 10 MG/ML IV BOLUS
INTRAVENOUS | Status: AC
Start: 2023-09-01 — End: 2023-09-01
  Filled 2023-09-01: qty 20

## 2023-09-01 MED ORDER — PHENYLEPHRINE HCL (PRESSORS) 10 MG/ML IV SOLN
INTRAVENOUS | Status: DC | PRN
  Administered 2023-09-01: 80 ug via INTRAVENOUS

## 2023-09-01 MED ORDER — ROCURONIUM BROMIDE 10 MG/ML (PF) SYRINGE
PREFILLED_SYRINGE | INTRAVENOUS | Status: AC
  Filled 2023-09-01: qty 10

## 2023-09-01 MED ORDER — CHLORHEXIDINE GLUCONATE 0.12 % MT SOLN
15.0000 mL | Freq: Once | OROMUCOSAL | Status: AC
  Administered 2023-09-01: 15 mL via OROMUCOSAL

## 2023-09-01 MED ORDER — OXYCODONE HCL 5 MG PO TABS: 5.0000 mg | ORAL_TABLET | Freq: Once | ORAL | Status: DC | PRN

## 2023-09-01 MED ORDER — SODIUM CHLORIDE 0.9 % IV SOLN: 2.0000 g | Freq: Once | INTRAVENOUS | Status: DC

## 2023-09-01 MED ORDER — EPHEDRINE 5 MG/ML INJ
INTRAVENOUS | Status: AC
  Filled 2023-09-01: qty 5

## 2023-09-01 MED ORDER — HEMOSTATIC AGENTS (NO CHARGE) OPTIME
TOPICAL | Status: DC | PRN
  Administered 2023-09-01: 1

## 2023-09-01 MED ORDER — OXYCODONE HCL 5 MG PO TABS: 5.0000 mg | ORAL_TABLET | ORAL | Status: DC | PRN

## 2023-09-01 MED ORDER — ONDANSETRON HCL 4 MG/2ML IJ SOLN
INTRAMUSCULAR | Status: DC | PRN
  Administered 2023-09-01: 4 mg via INTRAVENOUS

## 2023-09-01 MED ORDER — ROCURONIUM BROMIDE 100 MG/10ML IV SOLN
INTRAVENOUS | Status: DC | PRN
  Administered 2023-09-01: 10 mg via INTRAVENOUS
  Administered 2023-09-01: 60 mg via INTRAVENOUS
  Administered 2023-09-01: 20 mg via INTRAVENOUS

## 2023-09-01 MED ORDER — DIPHENHYDRAMINE HCL 50 MG/ML IJ SOLN: 12.5000 mg | Freq: Four times a day (QID) | INTRAMUSCULAR | Status: DC | PRN

## 2023-09-01 MED ORDER — ONDANSETRON HCL 4 MG/2ML IJ SOLN: 4.0000 mg | INTRAMUSCULAR | Status: DC | PRN

## 2023-09-01 MED ORDER — LIDOCAINE HCL (CARDIAC) PF 100 MG/5ML IV SOSY
PREFILLED_SYRINGE | INTRAVENOUS | Status: DC | PRN
  Administered 2023-09-01: 100 mg via INTRAVENOUS

## 2023-09-01 MED ORDER — LIDOCAINE-EPINEPHRINE 1 %-1:100000 IJ SOLN
INTRAMUSCULAR | Status: AC
  Filled 2023-09-01: qty 1

## 2023-09-01 MED ORDER — IOHEXOL 300 MG/ML  SOLN
50.0000 mL | Freq: Once | INTRAMUSCULAR | Status: AC | PRN
  Administered 2023-09-01: 10 mL

## 2023-09-01 MED ORDER — TRIPLE ANTIBIOTIC 3.5-400-5000 EX OINT
1.0000 | TOPICAL_OINTMENT | Freq: Three times a day (TID) | CUTANEOUS | Status: DC | PRN
  Administered 2023-09-01: 1 via TOPICAL

## 2023-09-01 MED ORDER — OXYBUTYNIN CHLORIDE 5 MG PO TABS
5.0000 mg | ORAL_TABLET | Freq: Three times a day (TID) | ORAL | Status: DC | PRN
  Administered 2023-09-01: 5 mg via ORAL
  Filled 2023-09-01: qty 1

## 2023-09-01 MED ORDER — FENTANYL CITRATE (PF) 100 MCG/2ML IJ SOLN
INTRAMUSCULAR | Status: AC
  Filled 2023-09-01: qty 2

## 2023-09-01 MED ORDER — ESCITALOPRAM OXALATE 20 MG PO TABS
20.0000 mg | ORAL_TABLET | Freq: Every day | ORAL | Status: DC
  Administered 2023-09-01 – 2023-09-02 (×2): 20 mg via ORAL
  Filled 2023-09-01: qty 2
  Filled 2023-09-01: qty 1

## 2023-09-01 MED ORDER — LACTATED RINGERS IV SOLN: INTRAVENOUS | Status: DC | PRN

## 2023-09-01 MED ORDER — MORPHINE SULFATE (PF) 2 MG/ML IV SOLN: 2.0000 mg | INTRAVENOUS | Status: DC | PRN

## 2023-09-01 MED ORDER — LORATADINE 10 MG PO TABS
10.0000 mg | ORAL_TABLET | Freq: Every day | ORAL | Status: DC
Start: 2023-09-02 — End: 2023-09-02
  Administered 2023-09-02: 10 mg via ORAL
  Filled 2023-09-01: qty 1

## 2023-09-01 MED ORDER — ACETAMINOPHEN 325 MG PO TABS
650.0000 mg | ORAL_TABLET | ORAL | Status: DC | PRN
  Administered 2023-09-01: 650 mg via ORAL
  Filled 2023-09-01: qty 2

## 2023-09-01 MED ORDER — ALBUTEROL SULFATE HFA 108 (90 BASE) MCG/ACT IN AERS
INHALATION_SPRAY | RESPIRATORY_TRACT | Status: AC
  Filled 2023-09-01: qty 6.7

## 2023-09-01 MED ORDER — CEFAZOLIN SODIUM-DEXTROSE 2-4 GM/100ML-% IV SOLN
2.0000 g | INTRAVENOUS | Status: AC
  Administered 2023-09-01: 2 g via INTRAVENOUS
  Filled 2023-09-01: qty 100

## 2023-09-01 MED ORDER — PROPOFOL 10 MG/ML IV BOLUS
INTRAVENOUS | Status: DC | PRN
Start: 2023-09-01 — End: 2023-09-01
  Administered 2023-09-01: 150 mg via INTRAVENOUS
  Administered 2023-09-01: 50 mg via INTRAVENOUS

## 2023-09-01 MED ORDER — ACETAMINOPHEN 500 MG PO TABS
1000.0000 mg | ORAL_TABLET | Freq: Once | ORAL | Status: AC
  Administered 2023-09-01: 1000 mg via ORAL
  Filled 2023-09-01: qty 2

## 2023-09-01 MED ORDER — FUROSEMIDE 40 MG PO TABS
40.0000 mg | ORAL_TABLET | Freq: Every day | ORAL | Status: DC
Start: 2023-09-01 — End: 2023-09-02
  Administered 2023-09-01 – 2023-09-02 (×2): 40 mg via ORAL
  Filled 2023-09-01: qty 1
  Filled 2023-09-01: qty 2

## 2023-09-01 MED ORDER — METOPROLOL TARTRATE 25 MG PO TABS
25.0000 mg | ORAL_TABLET | Freq: Two times a day (BID) | ORAL | Status: DC
  Administered 2023-09-01 – 2023-09-02 (×2): 25 mg via ORAL
  Filled 2023-09-01 (×2): qty 1

## 2023-09-01 MED ORDER — MIDAZOLAM HCL 2 MG/2ML IJ SOLN
INTRAMUSCULAR | Status: AC
  Filled 2023-09-01: qty 2

## 2023-09-01 MED ORDER — SODIUM CHLORIDE 0.9 % IV SOLN: INTRAVENOUS | Status: DC

## 2023-09-01 MED ORDER — LIDOCAINE HCL (PF) 2 % IJ SOLN
INTRAMUSCULAR | Status: AC
  Filled 2023-09-01: qty 5

## 2023-09-01 MED ORDER — SENNA 8.6 MG PO TABS
1.0000 | ORAL_TABLET | Freq: Two times a day (BID) | ORAL | Status: DC
  Filled 2023-09-01: qty 1

## 2023-09-01 MED ORDER — AMISULPRIDE (ANTIEMETIC) 5 MG/2ML IV SOLN: 10.0000 mg | Freq: Once | INTRAVENOUS | Status: DC | PRN

## 2023-09-01 MED ORDER — IOHEXOL 300 MG/ML  SOLN
INTRAMUSCULAR | Status: DC | PRN
  Administered 2023-09-01: 25 mL

## 2023-09-01 MED ORDER — FENTANYL CITRATE (PF) 100 MCG/2ML IJ SOLN
INTRAMUSCULAR | Status: DC | PRN
  Administered 2023-09-01 (×2): 50 ug via INTRAVENOUS
  Administered 2023-09-01: 25 ug via INTRAVENOUS

## 2023-09-01 MED ORDER — INSULIN ASPART 100 UNIT/ML IJ SOLN
0.0000 [IU] | Freq: Three times a day (TID) | INTRAMUSCULAR | Status: DC
  Administered 2023-09-01: 5 [IU] via SUBCUTANEOUS
  Administered 2023-09-02 (×2): 3 [IU] via SUBCUTANEOUS

## 2023-09-01 MED ORDER — FENTANYL CITRATE (PF) 100 MCG/2ML IJ SOLN
INTRAMUSCULAR | Status: AC | PRN
  Administered 2023-09-01: 50 ug via INTRAVENOUS

## 2023-09-01 MED ORDER — GABAPENTIN 100 MG PO CAPS
100.0000 mg | ORAL_CAPSULE | Freq: Two times a day (BID) | ORAL | Status: DC
  Administered 2023-09-01 – 2023-09-02 (×2): 100 mg via ORAL
  Filled 2023-09-01 (×2): qty 1

## 2023-09-01 MED ORDER — INSULIN ASPART 100 UNIT/ML IJ SOLN: 0.0000 [IU] | INTRAMUSCULAR | Status: DC | PRN

## 2023-09-01 MED ORDER — AMLODIPINE BESYLATE 10 MG PO TABS
10.0000 mg | ORAL_TABLET | Freq: Every day | ORAL | Status: DC
  Administered 2023-09-02: 10 mg via ORAL
  Filled 2023-09-01: qty 1

## 2023-09-01 MED ORDER — MIDAZOLAM HCL 2 MG/2ML IJ SOLN
INTRAMUSCULAR | Status: AC | PRN
Start: 2023-09-01 — End: 2023-09-01
  Administered 2023-09-01: 1 mg via INTRAVENOUS

## 2023-09-01 MED ORDER — OXYCODONE HCL 5 MG/5ML PO SOLN: 5.0000 mg | Freq: Once | ORAL | Status: DC | PRN

## 2023-09-01 MED ORDER — FENTANYL CITRATE (PF) 250 MCG/5ML IJ SOLN
INTRAMUSCULAR | Status: AC
  Filled 2023-09-01: qty 5

## 2023-09-01 MED ORDER — PHENYLEPHRINE HCL-NACL 20-0.9 MG/250ML-% IV SOLN
INTRAVENOUS | Status: DC | PRN
  Administered 2023-09-01: 30 ug/min via INTRAVENOUS
  Administered 2023-09-01: 80 ug via INTRAVENOUS

## 2023-09-01 MED ORDER — FENTANYL CITRATE PF 50 MCG/ML IJ SOSY: 25.0000 ug | PREFILLED_SYRINGE | INTRAMUSCULAR | Status: DC | PRN

## 2023-09-01 MED ORDER — ORAL CARE MOUTH RINSE: 15.0000 mL | Freq: Once | OROMUCOSAL | Status: AC

## 2023-09-01 MED ORDER — ALBUTEROL SULFATE HFA 108 (90 BASE) MCG/ACT IN AERS
INHALATION_SPRAY | RESPIRATORY_TRACT | Status: DC | PRN
  Administered 2023-09-01 (×2): 4 via RESPIRATORY_TRACT

## 2023-09-01 MED ORDER — DEXAMETHASONE SODIUM PHOSPHATE 10 MG/ML IJ SOLN
INTRAMUSCULAR | Status: AC
  Filled 2023-09-01: qty 1

## 2023-09-01 MED ORDER — HYDROCODONE-ACETAMINOPHEN 5-325 MG PO TABS
1.0000 | ORAL_TABLET | Freq: Four times a day (QID) | ORAL | 0 refills | Status: DC | PRN
Start: 2023-09-01 — End: 2023-12-29

## 2023-09-01 MED ORDER — SODIUM CHLORIDE 0.9 % IR SOLN
Status: DC | PRN
  Administered 2023-09-01: 3000 mL
  Administered 2023-09-01 (×2): 6000 mL

## 2023-09-01 MED ORDER — FOLIC ACID 1 MG PO TABS
1.0000 mg | ORAL_TABLET | ORAL | Status: DC
  Administered 2023-09-02: 1 mg via ORAL
  Filled 2023-09-01: qty 1

## 2023-09-01 MED ORDER — TRANEXAMIC ACID-NACL 1000-0.7 MG/100ML-% IV SOLN
1000.0000 mg | INTRAVENOUS | Status: AC
  Administered 2023-09-01: 1000 mg via INTRAVENOUS
  Filled 2023-09-01: qty 100

## 2023-09-01 MED ORDER — DEXAMETHASONE SODIUM PHOSPHATE 10 MG/ML IJ SOLN
INTRAMUSCULAR | Status: DC | PRN
  Administered 2023-09-01: 4 mg via INTRAVENOUS

## 2023-09-01 SURGICAL SUPPLY — 45 items
BAG COUNTER SPONGE SURGICOUNT (BAG) IMPLANT
BAG URINE DRAIN 2000ML AR STRL (UROLOGICAL SUPPLIES) IMPLANT
BASKET ZERO TIP NITINOL 2.4FR (BASKET) IMPLANT
BENZOIN TINCTURE PRP APPL 2/3 (GAUZE/BANDAGES/DRESSINGS) ×1 IMPLANT
BLADE SURG 15 STRL LF DISP TIS (BLADE) ×1 IMPLANT
CATH FOLEY 2W COUNCIL 20FR 5CC (CATHETERS) IMPLANT
CATH ROBINSON RED A/P 20FR (CATHETERS) IMPLANT
CATH URETERAL DUAL LUMEN 10F (MISCELLANEOUS) ×1 IMPLANT
CATH X-FORCE N30 NEPHROSTOMY (TUBING) ×1 IMPLANT
CHLORAPREP W/TINT 26 (MISCELLANEOUS) ×1 IMPLANT
COVER BACK TABLE 60X90IN (DRAPES) ×1 IMPLANT
COVER SURGICAL LIGHT HANDLE (MISCELLANEOUS) IMPLANT
DRAPE C-ARM 42X120 X-RAY (DRAPES) ×1 IMPLANT
DRAPE LINGEMAN PERC (DRAPES) ×1 IMPLANT
DRAPE SURG IRRIG POUCH 19X23 (DRAPES) ×1 IMPLANT
DRSG TEGADERM 8X12 (GAUZE/BANDAGES/DRESSINGS) IMPLANT
DRSG TELFA PLUS 4X6 ADH ISLAND (GAUZE/BANDAGES/DRESSINGS) IMPLANT
GAUZE PAD ABD 8X10 STRL (GAUZE/BANDAGES/DRESSINGS) IMPLANT
GAUZE SPONGE 4X4 12PLY STRL (GAUZE/BANDAGES/DRESSINGS) IMPLANT
GLOVE BIO SURGEON STRL SZ7.5 (GLOVE) ×1 IMPLANT
GOWN STRL REUS W/ TWL XL LVL3 (GOWN DISPOSABLE) ×1 IMPLANT
GUIDEWIRE AMPLAZ .035X145 (WIRE) ×1 IMPLANT
GUIDEWIRE STR DUAL SENSOR (WIRE) ×1 IMPLANT
KIT BASIN OR (CUSTOM PROCEDURE TRAY) ×1 IMPLANT
KIT PROBE TRILOGY 3.9X350 (MISCELLANEOUS) IMPLANT
KIT TURNOVER KIT A (KITS) ×1 IMPLANT
LUBRICANT JELLY K Y 4OZ (MISCELLANEOUS) ×1 IMPLANT
MANIFOLD NEPTUNE II (INSTRUMENTS) ×1 IMPLANT
NS IRRIG 1000ML POUR BTL (IV SOLUTION) ×1 IMPLANT
PACK CYSTO (CUSTOM PROCEDURE TRAY) IMPLANT
SPONGE T-LAP 4X18 ~~LOC~~+RFID (SPONGE) ×1 IMPLANT
STENT ENDOURETEROTOMY 7-14 26C (STENTS) IMPLANT
STENT URET 6FRX28 CONTOUR (STENTS) IMPLANT
SURGIFLO W/THROMBIN 8M KIT (HEMOSTASIS) IMPLANT
SUT CHROMIC 3 0 SH 27 (SUTURE) IMPLANT
SUT MNCRL AB 3-0 PS2 18 (SUTURE) IMPLANT
SUT SILK 2 0 30 PSL (SUTURE) IMPLANT
SYR 20ML LL LF (SYRINGE) ×1 IMPLANT
TOWEL OR 17X26 10 PK STRL BLUE (TOWEL DISPOSABLE) ×1 IMPLANT
TRACTIP FLEXIVA PULS ID 200XHI (Laser) IMPLANT
TRAY FOLEY MTR SLVR 16FR STAT (SET/KITS/TRAYS/PACK) ×1 IMPLANT
TUBING CONNECTING 10 (TUBING) ×1 IMPLANT
TUBING STONE CATCHER TRILOGY (MISCELLANEOUS) IMPLANT
TUBING UROLOGY SET (TUBING) ×1 IMPLANT
WATER STERILE IRR 1000ML POUR (IV SOLUTION) ×1 IMPLANT

## 2023-09-01 NOTE — H&P (Signed)
 CC/HPI: 1. ED -worsening erections for many years. He has tried multiple p.o. medications and a VED and none were satisfactory. He is not able to achieve erections at all which at times causes frustration. Denies morning or nocturnal engorgement. Interested in implant.   2. Microhematuria -UA after last visit showed RBCs. CT hematuria shows around 3 cm of stone in the right kidney ( 2 cm stone at the UPJ with some associated mild hydronephrosis, 1 cm stone in the lower pole). On review of a CT scan for 2024 the stones were similar-appearing.   Past medical history notable for diabetes. A1c 7.9 December; more recently he was started on Jardiance and feels it was better. Does not take blood thinners aside from a baby aspirin . No pelvic operations. Status post toe amputation earlier this year for osteomyelitis   No acute complaints. Patient denies any right flank pain but has had some intermittent nausea over the last year.   07/02/2023  Patient was evaluated for microscopic hematuria and had a right 1.7 cm renal pelvic stone. Therefore, referred to me for consideration of PCNL. Does have a history of cardiovascular disease status post cardiovascular stent in 2021. Is on aspirin  81 mg but no other blood thinners. He recently saw Dr. Dewane his cardiologist. Based on notes sounds like he was stable from a cardiac standpoint. He is not having significant right-sided flank pain. He was recent admitted to the hospital for sepsis and pneumonia. Was treated with antibiotics and is now doing well.   09/01/2023 Patient presents today for right PCNL.   ALLERGIES: naproxen    MEDICATIONS: Lisinopril   metFORMIN  HCl  amLODIPine  Besylate  Baby Aspirin   Claritin   Furosemide   Gabapentin   Jardiance  Lexapro   Methotrexate  Metoprolol  Succinate ER  Repatha   Trulicity      GU PSH: No GU PSH    NON-GU PSH: Coronary Artery Bypass Grafting Knee replacement Visit Complexity (formerly GPC1X) - 06/23/2023      GU PMH: Male ED, unspecified - 06/23/2023, - 05/15/2023 Microscopic hematuria - 06/23/2023 Renal calculus - 06/23/2023    NON-GU PMH: Arthritis Diabetes Type 2 Heart disease, unspecified Hypercholesterolemia Hypertension Myocardial Infarction Other heart failure    FAMILY HISTORY: 2 sons - Son   SOCIAL HISTORY: Marital Status: Married Preferred Language: English; Ethnicity: Not Hispanic Or Latino; Race: White Current Smoking Status: Patient has never smoked.   Tobacco Use Assessment Completed: Used Tobacco in last 30 days? Does not use smokeless tobacco. Has never drank.  Drinks 4+ caffeinated drinks per day. Patient's occupation is/was Retired.    REVIEW OF SYSTEMS:    GU Review Male:   Patient denies frequent urination, hard to postpone urination, burning/ pain with urination, get up at night to urinate, leakage of urine, stream starts and stops, trouble starting your stream, have to strain to urinate , erection problems, and penile pain.  Gastrointestinal (Upper):   Patient denies nausea, vomiting, and indigestion/ heartburn.  Gastrointestinal (Lower):   Patient denies diarrhea and constipation.  Constitutional:   Patient denies fever, night sweats, weight loss, and fatigue.  Skin:   Patient denies skin rash/ lesion and itching.  Eyes:   Patient denies double vision and blurred vision.  Ears/ Nose/ Throat:   Patient denies sore throat and sinus problems.  Hematologic/Lymphatic:   Patient denies swollen glands and easy bruising.  Cardiovascular:   Patient denies leg swelling and chest pains.  Respiratory:   Patient denies cough and shortness of breath.  Endocrine:   Patient denies  excessive thirst.  Musculoskeletal:   Patient denies back pain and joint pain.  Neurological:   Patient denies headaches and dizziness.  Psychologic:   Patient denies depression and anxiety.   VITAL SIGNS: None   MULTI-SYSTEM PHYSICAL EXAMINATION:    Constitutional: Well-nourished. No  physical deformities. Normally developed. Good grooming.  Respiratory: No labored breathing, no use of accessory muscles.   Gastrointestinal: No mass, no tenderness, no rigidity, non obese abdomen.  Eyes: Normal conjunctivae. Normal eyelids.  Musculoskeletal: Normal gait and station of head and neck.     ASSESSMENT:      ICD-10 Details  1 GU:   Microscopic hematuria - R31.21 Chronic, Stable  2   Renal calculus - N20.0 Chronic, Stable   PLAN:    For PCNL I described the risks including heart attack, pulmonary embolus, death, positioning injury, pneumothorax, hydrothorax, need for chest tube, inability to clear stone burden, renal laceration, arterial venous fistula or malformation, need for embolization of kidney, loss of kidney or renal function, need for repeat procedure, need for prolonged nephrostomy tube, ureteral avulsion.

## 2023-09-01 NOTE — Procedures (Signed)
  Procedure:  R antegrade nephroureteral catheter placement 8f Preprocedure diagnosis: The encounter diagnosis was Right renal stone. Postprocedure diagnosis: same EBL:    minimal Complications:   none immediate  See full dictation in [employer].  CHARM Toribio Faes MD Main # [phone] Pager  [phone] Mobile [phone]

## 2023-09-01 NOTE — Discharge Instructions (Signed)
 Discharge instructions following PCNL  Call your doctor for: Fevers greater than 100.5 Severe nausea or vomiting Increasing pain not controlled by pain medication Increasing redness or drainage from incisions Decreased urine output or a catheter is no longer draining  The number for questions is [phone].  Activity: Gradually increase activity with short frequent walks, 3-4 times a day.  Avoid strenuous activities, like sports, lawn-mowing, or heavy lifting (more than 10-15 pounds).  Wear loose, comfortable clothing that pull or kink the tube or tubes.  Do not drive while taking pain medication, or until your doctor permitts it.  Bathing and dressing changes: You should not shower for 48 hours after surgery.  Do not soak your back in a bathtub.  Drainage bag care: You may be discharged with a drainage bag around the site of your surgery.  The drainage bag should be secured such that it never pulls or loosens to prevent it from leaking.  It is important to wash her hands before and after emptying the drainage bag to help prevent the spread of infection.  The drainage bag should be emptied as needed.  When the wound stops draining or it is manageable with a dry gauze dressing, you can remove the bag.  If your tube in the back was removed, you should expect to have some leakage of fluid from the back incision.  This should slowly decrease and stop over the next couple of days.  If you have severe pain or persistent leakage, please call the number above.  Otherwise, your dressing can be changed 1-2 times daily or more if needed.  Diet: It is extremely important to drink plenty of fluids after surgery, especially water.  You may resume your regular diet, unless otherwise instructed.  Medications: May take Tylenol (acetaminophen) or ibuprofen (Advil, Motrin) as directed over-the-counter. Take any prescriptions as directed.  Follow-up appointments: Follow-up appointment will be scheduled  with Dr. Alvester Morin

## 2023-09-01 NOTE — Plan of Care (Signed)

## 2023-09-01 NOTE — OR Nursing (Signed)
 Patient's dentures were placed in a denture cup and sent to PACU to be with patient's bag of personal belongings.

## 2023-09-01 NOTE — Anesthesia Preprocedure Evaluation (Addendum)
 Anesthesia Evaluation  Patient identified by MRN, date of birth, ID band Patient awake    Reviewed: Allergy & Precautions, NPO status , Patient's Chart, lab work & pertinent test results, reviewed documented beta blocker date and time   History of Anesthesia Complications (+) PONV and history of anesthetic complications  Airway Mallampati: II  TM Distance: >3 FB Neck ROM: Full    Dental  (+) Edentulous Upper, Edentulous Lower, Upper Dentures   Pulmonary former smoker   Pulmonary exam normal breath sounds clear to auscultation       Cardiovascular hypertension, Pt. on medications and Pt. on home beta blockers + CAD, + Past MI and + CABG  Normal cardiovascular exam+ dysrhythmias Atrial Fibrillation  Rhythm:Regular Rate:Normal  Cath 2021  Prox RCA lesion is 100% stenosed.  RPDA lesion is 10% stenosed.  Mid LM lesion is 75% stenosed.  Dist RCA lesion is 100% stenosed.   LM-70-76% stenosis LAD-insignificant disease LCx-Insignificant disease RCA-occluded proximally with collaterals to distal rca from left.   After careful evaluation, will consider cabg due to left main disease. Will get further opinion from tertiary care center.  TTE 2022 Normal EF, mild MR    Neuro/Psych  PSYCHIATRIC DISORDERS Anxiety Depression    negative neurological ROS     GI/Hepatic negative GI ROS, Neg liver ROS,,,  Endo/Other  diabetes, Type 2, Oral Hypoglycemic Agents    Renal/GU Renal disease  negative genitourinary   Musculoskeletal  (+) Arthritis ,    Abdominal   Peds  Hematology negative hematology ROS (+)   Anesthesia Other Findings   Reproductive/Obstetrics                              Anesthesia Physical Anesthesia Plan  ASA: 3  Anesthesia Plan: General   Post-op Pain Management: Tylenol  PO (pre-op)*   Induction: Intravenous  PONV Risk Score and Plan: 3 and Dexamethasone , Ondansetron  and  Treatment may vary due to age or medical condition  Airway Management Planned: Oral ETT  Additional Equipment:   Intra-op Plan:   Post-operative Plan: Extubation in OR  Informed Consent: I have reviewed the patients History and Physical, chart, labs and discussed the procedure including the risks, benefits and alternatives for the proposed anesthesia with the patient or authorized representative who has indicated his/her understanding and acceptance.     Dental advisory given  Plan Discussed with: CRNA  Anesthesia Plan Comments:          Anesthesia Quick Evaluation

## 2023-09-01 NOTE — Transfer of Care (Signed)
 Immediate Anesthesia Transfer of Care Note  Patient: Kevin Perry [facility]  Procedure(s) Performed: NEPHROLITHOTOMY PERCUTANEOUS (Right)  Patient Location: PACU  Anesthesia Type:General  Level of Consciousness: awake, alert , oriented, and patient cooperative  Airway & Oxygen  Therapy: Patient Spontanous Breathing and Patient connected to face mask oxygen   Post-op Assessment: Report given to RN and Post -op Vital signs reviewed and stable  Post vital signs: Reviewed and stable  Last Vitals:  Vitals Value Taken Time  BP 158/81 09/01/23 14:03  Temp 36.5 C 09/01/23 14:03  Pulse 64 09/01/23 14:07  Resp 22 09/01/23 14:07  SpO2 99 % 09/01/23 14:07  Vitals shown include unfiled device data.  Last Pain:  Vitals:   09/01/23 1403  PainSc: 0-No pain         Complications: No notable events documented.

## 2023-09-01 NOTE — Anesthesia Procedure Notes (Signed)
 Procedure Name: Intubation Date/Time: 09/01/2023 12:20 PM  Performed by: Kathern Rollene LABOR, CRNAPre-anesthesia Checklist: Patient identified, Emergency Drugs available, Suction available and Patient being monitored Patient Re-evaluated:Patient Re-evaluated prior to induction Oxygen  Delivery Method: Circle system utilized Preoxygenation: Pre-oxygenation with 100% oxygen  Induction Type: IV induction Ventilation: Mask ventilation without difficulty Laryngoscope Size: Mac and 4 Grade View: Grade II Tube type: Oral Tube size: 8.0 mm Number of attempts: 1 Airway Equipment and Method: Stylet and Oral airway Placement Confirmation: ETT inserted through vocal cords under direct vision, positive ETCO2 and breath sounds checked- equal and bilateral Secured at: 22 cm Tube secured with: Tape Dental Injury: Teeth and Oropharynx as per pre-operative assessment

## 2023-09-01 NOTE — Op Note (Signed)
 Operative Note  Preoperative diagnosis:  1.  Right renal calculus  Postoperative diagnosis: 1.  Right renal calculus   Procedure(s): 1.  Right percutaneous nephrolithotomy less than 2 cm  Surgeon: Sherwood Edison, MD  Assistants: None  Anesthesia: General  Complications: None immediate  EBL: 100 cc  Specimens: 1.  Renal calculus  Drains/Catheters: 1.  6 x 28 double-J ureteral stent 2.  Foley catheter  Intraoperative findings: 1 cm lower pole calculus and 1.7 cm renal pelvic calculus.  All stone removed  Indication: 73 year old male with a large right renal calculus presents for the previously mentioned operation.  Description of procedure:  The patient was identified and consent was obtained.  The patient was taken to the operating room and placed in the supine position.  The patient was placed under general anesthesia.  Perioperative antibiotics were administered.  The patient was placed in prone position and all pressure points were padded.  Patient was prepped and draped in a standard sterile fashion and a timeout was performed.  A Super Stiff wire was advanced through the nephroureteral stent down to the bladder under fluoroscopic guidance and the nephroureteral stent was removed.  A dual-lumen ureteral catheter was advanced over the Super Stiff wire into the renal pelvis and an antegrade nephrostogram was performed.  This showed a well opacified kidney and a filling defect corresponding to the stone of interest.  I advanced the dual-lumen ureteral catheter into the proximal ureter under fluoroscopic guidance followed by placement of a second Super Stiff wire down to the bladder under fluoroscopic guidance.  The dual-lumen catheter was removed.  An incision was made alongside the wires.  The balloon dilator was then advanced over one of the wires and into the renal pelvis fluoroscopic guidance and the tract was dilated to a pressure of 18.  The sheath was advanced over the balloon  and into the renal pelvis.  The balloon was withdrawn keeping the sheath in place.  The nephroscope was advanced into the kidney and the stone of interest was encountered.  The stone was then removed with a combination of pneumatic and ultrasound with suction and removal with graspers.  All stone was removed and there was no evidence of any other stones within the kidney.  I inspected the entire kidney with the flexible cystoscope and no other stones were seen as well.    A 6 x 28 double-J ureteral stent was advanced over 1 of the wires under fluoroscopic guidance and the wire was withdrawn.  Fluoroscopy confirmed a good coil within the bladder as well as a good coil in the renal pelvis proximally.  The nephroscope and sheath were withdrawn.  Floseal was instilled along the nephrostomy tube tract.  The incision was closed with running 3-0 Monocryl.  This concluded the operation.  The patient tolerated procedure well and was stable postoperatively.  Plan: Patient will remain under observation overnight. Stent to be removed in 1 week.

## 2023-09-02 ENCOUNTER — Encounter (HOSPITAL_COMMUNITY): Payer: Self-pay | Admitting: Urology

## 2023-09-02 DIAGNOSIS — N200 Calculus of kidney: Secondary | ICD-10-CM | POA: Diagnosis not present

## 2023-09-02 LAB — GLUCOSE, CAPILLARY: Glucose-Capillary: 193 mg/dL — ABNORMAL HIGH (ref 70–99)

## 2023-09-02 LAB — HEMOGLOBIN AND HEMATOCRIT, BLOOD
HCT: 45.1 % (ref 39.0–52.0)
Hemoglobin: 15.4 g/dL (ref 13.0–17.0)

## 2023-09-02 NOTE — Discharge Summary (Signed)
 Physician Discharge Summary  Patient ID: Kevin Perry MRN: [id number] DOB/AGE: [birth year] 73 y.o.  Admit date: 09/01/2023 Discharge date: 09/02/2023  Admission Diagnoses:  Discharge Diagnoses:  Principal Problem:   Renal calculi   Discharged Condition: good  Hospital Course: Patient underwent a right PCNL.  Remained overnight for observation.  Hemoglobin stable and he was doing well the following day.  Voided after catheter removal and was discharged in stable condition.  Consults: None  Significant Diagnostic Studies: None  Treatments: surgery: Right PCNL  Discharge Exam: Blood pressure (!) 142/72, pulse (!) 56, temperature 98.4 F (36.9 C), temperature source Oral, resp. rate 18, height 6' 3 (1.905 m), weight 111.1 kg, SpO2 95%. General appearance: alert, no acute distress Adequate perfusion of extremities Nonlabored respiration Abdomen soft, nontender, nondistended Right flank incision clean dry and intact  Disposition: Discharge disposition: 01-Home or Self Care       Discharge Instructions     No wound care   Complete by: As directed       Allergies as of 09/02/2023       Reactions   Naproxen Swelling, Anaphylaxis   Rosuvastatin  Other (See Comments)        Medication List     TAKE these medications    amLODipine  10 MG tablet Commonly known as: NORVASC  Take 1 tablet (10 mg total) by mouth daily.   aspirin  EC 81 MG tablet Take 81 mg by mouth daily. Swallow whole.   baclofen  10 MG tablet Commonly known as: LIORESAL  Take 0.5-1 tablets (5-10 mg total) by mouth 3 (three) times daily as needed for muscle spasms.   cyanocobalamin  1000 MCG tablet Commonly known as: VITAMIN B12 Take 1 tablet (1,000 mcg total) by mouth daily.   empagliflozin 25 MG Tabs tablet Commonly known as: JARDIANCE Take 25 mg by mouth daily.   escitalopram  20 MG tablet Commonly known as: LEXAPRO  Take 1 tablet (20 mg total) by mouth daily.   folic acid  1 MG  tablet Commonly known as: FOLVITE  Take 1 mg by mouth See admin instructions. Take 1 mg daily except skip dose on Wednesdays (methotrexate day)   furosemide  40 MG tablet Commonly known as: LASIX  TAKE 1 TABLET BY MOUTH ONCE DAILY   gabapentin  100 MG capsule Commonly known as: NEURONTIN  Take 1 capsule (100 mg total) by mouth 2 (two) times daily.   HYDROcodone -acetaminophen  5-325 MG tablet Commonly known as: NORCO/VICODIN Take 1 tablet by mouth every 6 (six) hours as needed.   lisinopril  20 MG tablet Commonly known as: ZESTRIL  Take 1 tablet by mouth 2 (two) times daily.   loratadine  10 MG tablet Commonly known as: CLARITIN  Take 10 mg by mouth every morning.   metFORMIN  500 MG tablet Commonly known as: GLUCOPHAGE  TAKE 2 TABLETS BY MOUTH ONCE EVERY MORNING WITH BREAKFAST AND 1 TABLET ONCEEVERY EVENING WITH DINNER   methotrexate 2.5 MG tablet Commonly known as: RHEUMATREX Take 10 mg by mouth once a week. On Wednesdays   metoprolol  tartrate 25 MG tablet Commonly known as: LOPRESSOR  Take 1 tablet (25 mg total) by mouth 2 (two) times daily.   nitroGLYCERIN  0.4 MG SL tablet Commonly known as: NITROSTAT  Place 0.4 mg under the tongue every 5 (five) minutes as needed for chest pain.   Repatha  SureClick 140 MG/ML Soaj Generic drug: Evolocumab  Inject 140 mg as directed every 14 (fourteen) days.   Trulicity  3 MG/0.5ML Soaj Generic drug: Dulaglutide  INJECT 3 MG (0.5 ML) UNDER THE SKIN ONCE A WEEK  Signed: Sherwood JONETTA Edison, III 09/02/2023, 4:54 PM

## 2023-09-02 NOTE — Progress Notes (Signed)
 AVS and discharge instructions reviewed w/ patient and spouse. All parties verbalized understanding.

## 2023-09-02 NOTE — TOC Initial Note (Signed)
 Transition of Care [facility]) - Initial/Assessment Note    Patient Details  Name: Kevin Perry MRN: [id number] Date of Birth: March 19, 1950  Transition of Care [facility]) CM/SW Contact:    Bascom Service, RN Phone Number: 09/02/2023, 12:26 PM  Clinical Narrative:  d/c plan home.                 Expected Discharge Plan: Home/Self Care Barriers to Discharge: Continued Medical Work up   Patient Goals and CMS Choice Patient states their goals for this hospitalization and ongoing recovery are:: Home CMS Medicare.gov Compare Post Acute Care list provided to:: Patient Choice offered to / list presented to : Patient [place] ownership interest in [facility].provided to:: Patient    Expected Discharge Plan and Services   Discharge Planning Services: CM Consult Post Acute Care Choice: NA Living arrangements for the past 2 months: Single Family Home                                      Prior Living Arrangements/Services Living arrangements for the past 2 months: Single Family Home Lives with:: Spouse                   Activities of Daily Living   ADL Screening (condition at time of admission) Independently performs ADLs?: Yes (appropriate for developmental age) Is the patient deaf or have difficulty hearing?: Yes Does the patient have difficulty seeing, even when wearing glasses/contacts?: No Does the patient have difficulty concentrating, remembering, or making decisions?: No  Permission Sought/Granted                  Emotional Assessment              Admission diagnosis:  Calculus of kidney [N20.0] Renal calculi [N20.0] Patient Active Problem List   Diagnosis Date Noted   Renal calculi 09/01/2023   Sepsis (HCC) 06/28/2023   CAP (community acquired pneumonia) 06/28/2023   Bilateral nephrolithiasis 06/28/2023   Acute hypoxic respiratory failure (HCC) 06/28/2023   Diabetic ulcer of toe of left foot associated with type 2 diabetes mellitus,  with bone involvement without evidence of necrosis (HCC) 03/14/2023   Foot ulceration, left, with fat layer exposed (HCC) 03/13/2023   Dry skin 03/13/2023   Drug-induced myopathy 01/20/2023   History of complete ray amputation of third toe of right foot (HCC) 12/04/2020   History of complete ray amputation of second toe of right foot (HCC) 12/04/2020   Paroxysmal atrial fibrillation (HCC) 02/10/2020   S/P CABG x 4 12/08/2019   Post-op pain 11/27/2019   Postoperative nausea 11/27/2019   Moderate non-proliferative diabetic retinopathy (HCC) 10/08/2019   History of MI (myocardial infarction) 08/10/2019   Bilateral osteoarthritis of finger 07/17/2018   Obesity (BMI 30.0-34.9) 12/31/2017   Coronary artery disease involving native coronary artery of native heart without angina pectoris 03/09/2015   Essential hypertension 09/29/2014   Type 2 diabetes mellitus with other specified complication (HCC) 09/29/2014   Major depressive disorder, recurrent, moderate (HCC) 09/29/2014   Hyperlipidemia associated with type 2 diabetes mellitus (HCC) 09/29/2014   Edema of both lower extremities due to peripheral venous insufficiency 09/29/2014   Anxiety 09/20/2014   Primary osteoarthritis of knee 08/30/2014   PCP:  [name], DO Pharmacy:   [facility] [place] [place] - [address] [place] [address] Phone: [phone] Fax: [phone]  Social Drivers of Health (SDOH) Social History: SDOH Screenings   Food Insecurity: No Food Insecurity (09/01/2023)  Housing: Low Risk  (09/01/2023)  Transportation Needs: No Transportation Needs (09/01/2023)  Utilities: Not At Risk (09/01/2023)  Alcohol Screen: Low Risk  (04/04/2023)  Depression (PHQ2-9): Low Risk  (08/04/2023)  Financial Resource Strain: Low Risk  (07/31/2023)  Physical Activity: Sufficiently Active (07/31/2023)  Social Connections: Socially Integrated (09/01/2023)  Stress: No Stress Concern Present  (07/31/2023)  Tobacco Use: Medium Risk (09/01/2023)  Health Literacy: Adequate Health Literacy (04/04/2023)   SDOH Interventions:     Readmission Risk Interventions     No data to display

## 2023-09-02 NOTE — Anesthesia Postprocedure Evaluation (Signed)
 Anesthesia Post Note  Patient: SIMPSON PAULOS  Procedure(s) Performed: NEPHROLITHOTOMY PERCUTANEOUS (Right)     Patient location during evaluation: PACU Anesthesia Type: General Level of consciousness: awake and alert Pain management: pain level controlled Vital Signs Assessment: post-procedure vital signs reviewed and stable Respiratory status: spontaneous breathing, nonlabored ventilation, respiratory function stable and patient connected to nasal cannula oxygen  Cardiovascular status: blood pressure returned to baseline and stable Postop Assessment: no apparent nausea or vomiting Anesthetic complications: no   No notable events documented.  Last Vitals:  Vitals:   09/02/23 0233 09/02/23 0621  BP: (!) 140/81 (!) 142/72  Pulse: (!) 54 (!) 56  Resp: 17 18  Temp: 37.1 C 36.9 C  SpO2: 96% 95%    Last Pain:  Vitals:   09/02/23 0811  TempSrc:   PainSc: 0-No pain                 Leira Regino L Quentina Fronek

## 2023-09-02 NOTE — Care Management Obs Status (Signed)
 MEDICARE OBSERVATION STATUS NOTIFICATION   Patient Details  Name: COLDEN SAMARAS MRN: [id number] Date of Birth: 10-Apr-1950   Medicare Observation Status Notification Given:  Yes    MahabirNathanel, RN 09/02/2023, 11:13 AM

## 2023-09-04 LAB — GLUCOSE, CAPILLARY: Glucose-Capillary: 172 mg/dL — ABNORMAL HIGH (ref 70–99)

## 2023-09-08 DIAGNOSIS — H26493 Other secondary cataract, bilateral: Secondary | ICD-10-CM | POA: Diagnosis not present

## 2023-09-08 DIAGNOSIS — E113291 Type 2 diabetes mellitus with mild nonproliferative diabetic retinopathy without macular edema, right eye: Secondary | ICD-10-CM | POA: Diagnosis not present

## 2023-09-08 DIAGNOSIS — N200 Calculus of kidney: Secondary | ICD-10-CM | POA: Diagnosis not present

## 2023-09-08 DIAGNOSIS — R3121 Asymptomatic microscopic hematuria: Secondary | ICD-10-CM | POA: Diagnosis not present

## 2023-09-08 DIAGNOSIS — E113312 Type 2 diabetes mellitus with moderate nonproliferative diabetic retinopathy with macular edema, left eye: Secondary | ICD-10-CM | POA: Diagnosis not present

## 2023-09-08 LAB — HM DIABETES EYE EXAM

## 2023-09-15 ENCOUNTER — Other Ambulatory Visit: Admitting: Pharmacist

## 2023-09-15 DIAGNOSIS — I10 Essential (primary) hypertension: Secondary | ICD-10-CM

## 2023-09-15 DIAGNOSIS — I2510 Atherosclerotic heart disease of native coronary artery without angina pectoris: Secondary | ICD-10-CM

## 2023-09-15 DIAGNOSIS — Z7985 Long-term (current) use of injectable non-insulin antidiabetic drugs: Secondary | ICD-10-CM

## 2023-09-15 DIAGNOSIS — E1169 Type 2 diabetes mellitus with other specified complication: Secondary | ICD-10-CM

## 2023-09-15 NOTE — Patient Instructions (Signed)
 Goals Addressed             This Visit's Progress    Pharmacy Goals       Our goal A1c is less than 7%. This corresponds with fasting sugars less than 130 and 2 hour after meal sugars less than 180. Please check your blood sugar and keep log of the results   Check your blood pressure twice weekly, and any time you have concerning symptoms like headache, chest pain, dizziness, shortness of breath, or vision changes.   Our goal is less than 130/80.  To appropriately check your blood pressure, make sure you do the following:  1) Avoid caffeine , exercise, or tobacco products for 30 minutes before checking. Empty your bladder. 2) Sit with your back supported in a flat-backed chair. Rest your arm on something flat (arm of the chair, table, etc). 3) Sit still with your feet flat on the floor, resting, for at least 5 minutes.  4) Check your blood pressure. Take 1-2 readings.  5) Write down these readings and bring with you to any provider appointments.  Bring your home blood pressure machine with you to a provider's office for accuracy comparison at least once a year.   Make sure you take your blood pressure medications before you come to any office visit, even if you were asked to fast for labs.   Feel free to call me with any questions or concerns. I look forward to our next call!  Sharyle Sia, PharmD, [facility] Clinical Pharmacist [facility] [phone]

## 2023-09-15 NOTE — Progress Notes (Signed)
 09/15/2023 Name: Kevin Perry MRN: [id number] DOB: [birth year]  Chief Complaint  Patient presents with   Medication Management   Medication Assistance    Kevin Perry is a 73 y.o. year old male who presented for a telephone visit.   They were referred to the pharmacist by their PCP for assistance in managing diabetes, hypertension, hyperlipidemia, and medication access.      Subjective:   Care Team: Primary Care Provider: [name], DO; Next Scheduled Visit: 10/13/2023 Cardiologist: Kevin Shiner, DO; Next Scheduled Visit: 11/21/2023 Gastroenterology: Kevin Ole Revel, MD  Medication Access/Adherence  Current Pharmacy:  [facility] [place], [place] - [address] [place] [address] Phone: [phone] Fax: [phone]   Patient reports affordability concerns with their medications: No Patient reports access/transportation concerns to their pharmacy: No  Patient reports adherence concerns with their medications:  No        Diabetes:   Current medications:  Metformin  IR 500mg  x 2 in AM / x 1 in PM Trulicity  3 mg weekly Jardiance 25 mg daily   Medications tried in the past: glipizide      Morning fasting glucose recently ranging 130-150; today: 147    Patient denies hypoglycemic s/sx including dizziness, shakiness, sweating.     Current medication access support:  - Enrolled for Trulicity  from [place] from [place] [employer] Patient Assistance Program through 02/04/2024    Hypertension:   Current medications:  Amlodipine  10mg  daily Furosemide  40 mg once daily Lisinopril  20 mg twice daily Metoprolol  tartrate 25 mg twice daily   Patient has an automated, upper arm home BP cuff   Recalls home blood pressure reading today: 123/74 (does not recall HR with reading)    Note latest inpatient BP readings elevated, but taken at the time of nephrolithotomy to address kidney stone    Hyperlipidemia/ASCVD Risk  Reduction   Current lipid lowering medications: Repatha  Sureclick 140 mg every 14 days                                                  Note Repatha  PA approved through 01/2024    Medications tried in the past: atorvastatin  (myopathy)   Antiplatelet regimen: aspirin  81 mg daily   ASCVD History: CAD with prior CABG and PCI    Current Medication Access Support: [employer] Hypercholesteremia- Medicare Access grant - patient approved through 01/24/2024   Objective:  Lab Results  Component Value Date   HGBA1C 6.5 (H) 06/28/2023    Lab Results  Component Value Date   CREATININE 0.94 09/01/2023   BUN 17 09/01/2023   NA 137 09/01/2023   K 3.6 09/01/2023   CL 102 09/01/2023   CO2 24 09/01/2023    Lab Results  Component Value Date   CHOL 172 01/17/2023   HDL 29 (L) 01/17/2023   LDLCALC  01/17/2023     Comment:     . LDL cholesterol not calculated. Triglyceride levels greater than 400 mg/dL invalidate calculated LDL results. . Reference range: <100 . Desirable range <100 mg/dL for primary prevention;   <70 mg/dL for patients with CHD or diabetic patients  with > or = 2 CHD risk factors. SABRA LDL-C is now calculated using the Martin-Hopkins  calculation, which is a validated novel method providing  better accuracy than the Friedewald equation in  the  estimation of LDL-C.  [name] et al. SANDREA. 7986;689(80): 2061-2068  (http://education.[employer].com/faq/FAQ164)    TRIG 580 (H) 01/17/2023   CHOLHDL 5.9 (H) 01/17/2023   BP Readings from Last 3 Encounters:  09/02/23 (!) 142/72  09/01/23 (!) 148/76  08/27/23 125/70   Pulse Readings from Last 3 Encounters:  09/02/23 (!) 56  09/01/23 (!) 50  08/27/23 61    Medications Reviewed Today     Reviewed by Kevin Sharyle LABOR, RPH-CPP (Pharmacist) on 09/15/23 at 1307  Med List Status: <None>   Medication Order Taking? Sig Documenting Provider Last Dose Status Informant  amLODipine  (NORVASC ) 10 MG tablet  [id number] Yes Take 1 tablet (10 mg total) by mouth daily. Kevin Marsa PARAS, DO  Active Self  aspirin  EC 81 MG tablet [id number] Yes Take 81 mg by mouth daily. Swallow whole. [provider]  Active Self  baclofen  (LIORESAL ) 10 MG tablet [id number]  Take 0.5-1 tablets (5-10 mg total) by mouth 3 (three) times daily as needed for muscle spasms.  Patient not taking: Reported on 06/28/2023   Kevin Marsa PARAS, DO  Active Self  cyanocobalamin  (VITAMIN B12) 1000 MCG tablet [id number]  Take 1 tablet (1,000 mcg total) by mouth daily.  Patient not taking: Reported on 06/28/2023   Kevin Marsa PARAS, DO  Active Self  empagliflozin (JARDIANCE) 25 MG TABS tablet [id number] Yes Take 25 mg by mouth daily. [provider]  Active Self  escitalopram  (LEXAPRO ) 20 MG tablet [id number]  Take 1 tablet (20 mg total) by mouth daily. Kevin Marsa PARAS, DO  Active Self  folic acid  (FOLVITE ) 1 MG tablet [id number]  Take 1 mg by mouth See admin instructions. Take 1 mg daily except skip dose on Wednesdays (methotrexate day) [provider]  Active Self  furosemide  (LASIX ) 40 MG tablet [id number] Yes TAKE 1 TABLET BY MOUTH ONCE DAILY Kevin, Marsa PARAS, DO  Active Self  gabapentin  (NEURONTIN ) 100 MG capsule [id number]  Take 1 capsule (100 mg total) by mouth 2 (two) times daily. Kevin Marsa PARAS, DO  Active Self  HYDROcodone -acetaminophen  (NORCO/VICODIN) 5-325 MG tablet [id number]  Take 1 tablet by mouth every 6 (six) hours as needed. Carolee Sherwood JONETTA Juniel, MD  Active   lisinopril  (ZESTRIL ) 20 MG tablet [id number] Yes Take 1 tablet by mouth 2 (two) times daily. [provider]  Active Self  loratadine  (CLARITIN ) 10 MG tablet [id number]  Take 10 mg by mouth every morning. [provider]  Active Self  metFORMIN  (GLUCOPHAGE ) 500 MG tablet [id number] Yes TAKE 2 TABLETS BY MOUTH ONCE EVERY MORNING WITH BREAKFAST AND 1 TABLET ONCEEVERY EVENING WITH DINNER  Karamalegos, Marsa PARAS, DO  Active Self  methotrexate (RHEUMATREX) 2.5 MG tablet [id number]  Take 10 mg by mouth once a week. On Wednesdays [provider]  Active Self  metoprolol  tartrate (LOPRESSOR ) 25 MG tablet [id number] Yes Take 1 tablet (25 mg total) by mouth 2 (two) times daily. Kevin Marsa PARAS, DO  Active Self  nitroGLYCERIN  (NITROSTAT ) 0.4 MG SL tablet [id number]  Place 0.4 mg under the tongue every 5 (five) minutes as needed for chest pain. [provider]  Active Self           Med Note CHRISTIE ALYSON Kitchens Aug 25, 2023 11:36 AM)    REPATHA  SURECLICK 140 MG/ML SOAJ [id number] Yes Inject 140 mg as directed every 14 (fourteen) days. Kevin Marsa PARAS, DO  Active Self           Med Note (  BARI ALYSON Kitchens Aug 25, 2023 11:35 AM) Monday  TRULICITY  3 MG/0.5ML SOPN [id number] Yes INJECT 3 MG (0.5 ML) UNDER THE SKIN ONCE A WEEK Karamalegos, Marsa PARAS, DO  Active Self           Med Note CHRISTIE ALYSON Kitchens Aug 25, 2023 11:35 AM) Monday              Assessment/Plan:   Diabetes: - Counseled on importance of having well-balanced meals, while controlling carbohydrate portion sizes - Recommend to continue to check glucose and keep a log of the results - Patient to contact [employer] patient assistance program as needed for refills of Trulicity  and [employer] as needed for refills of Jardiance     Hypertension: - Have reviewed appropriate blood pressure monitoring technique and reviewed goal blood pressure.  - Recommend patient to continue to monitor home BP, keep log of results including HR readings and bring this record to medical appointments      Hyperlipidemia/ASCVD Risk Reduction: - Patient to follow up with [facility] as needed for refills of Repatha  Note have provided grant billing information to patient's pharmacy Patient aware to provide to pharmacy and advise pharmacy to bill remaining balance of cost for Repatha   prescription to [employer] grant ONLY AFTER billed to Medicare      Follow Up Plan: Clinical Pharmacist will follow up with patient by telephone on 12/01/2023 at 1:00 PM     Sharyle Sia, PharmD, JAQUELINE, CPP Clinical Pharmacist [facility] [phone]

## 2023-09-16 ENCOUNTER — Ambulatory Visit (INDEPENDENT_AMBULATORY_CARE_PROVIDER_SITE_OTHER): Admitting: Ophthalmology

## 2023-09-16 ENCOUNTER — Encounter (INDEPENDENT_AMBULATORY_CARE_PROVIDER_SITE_OTHER): Payer: Self-pay | Admitting: Ophthalmology

## 2023-09-16 DIAGNOSIS — I10 Essential (primary) hypertension: Secondary | ICD-10-CM

## 2023-09-16 DIAGNOSIS — H35033 Hypertensive retinopathy, bilateral: Secondary | ICD-10-CM | POA: Diagnosis not present

## 2023-09-16 DIAGNOSIS — H34812 Central retinal vein occlusion, left eye, with macular edema: Secondary | ICD-10-CM | POA: Diagnosis not present

## 2023-09-16 DIAGNOSIS — E119 Type 2 diabetes mellitus without complications: Secondary | ICD-10-CM

## 2023-09-16 DIAGNOSIS — Z7985 Long-term (current) use of injectable non-insulin antidiabetic drugs: Secondary | ICD-10-CM

## 2023-09-16 DIAGNOSIS — Z961 Presence of intraocular lens: Secondary | ICD-10-CM

## 2023-09-16 DIAGNOSIS — H35372 Puckering of macula, left eye: Secondary | ICD-10-CM

## 2023-09-16 DIAGNOSIS — H35342 Macular cyst, hole, or pseudohole, left eye: Secondary | ICD-10-CM

## 2023-09-16 MED ORDER — BEVACIZUMAB CHEMO INJECTION 1.25MG/0.05ML SYRINGE FOR KALEIDOSCOPE
1.2500 mg | INTRAVITREAL | Status: AC | PRN
  Administered 2023-09-16 (×2): 1.25 mg via INTRAVITREAL

## 2023-09-16 NOTE — Progress Notes (Signed)
 [facility] - Clinic Note  09/16/2023     CHIEF COMPLAINT Patient presents for Retina Evaluation    HISTORY OF PRESENT ILLNESS: Kevin Perry is a 73 y.o. male who presents to the clinic today for:   HPI     Retina Evaluation   In both eyes.  Associated Symptoms Floaters and Blind Spot.  Negative for Flashes, Distortion, Pain, Redness, Photophobia, Glare, Trauma, Scalp Tenderness, Jaw Claudication, Shoulder/Hip pain, Fever, Weight Loss and Fatigue.  Context:  distance vision and reading.  Treatments tried include no treatments.  I, the attending physician,  performed the HPI with the patient and updated documentation appropriately.        Comments   Pt states in his left eye vision is more blurry toward the nasal of his eye but it has been that way a long time. Pt relies on his right eye. Pt has a moving floater that comes and goes. Pt does not use any ats. Pt denies any pain. A1c=6.5, 06/28/2023      Last edited by Valdemar Rogue, MD on 09/18/2023  1:46 AM.    Pt lost to retina follow up for 2y 7mos -- 01.04.23 to 08.12.25. Referred back by Dr. Mevelyn  Referring physician: Mevelyn JONETTA Bathe, OD [address] [place],  [place] [address]  HISTORICAL INFORMATION:  Selected notes from the MEDICAL RECORD NUMBER Referred by Dr. CHARM Bathe Mevelyn for decreased vision and macular edema OS   CURRENT MEDICATIONS: No current outpatient medications on file. (Ophthalmic Drugs)   No current facility-administered medications for this visit. (Ophthalmic Drugs)   Current Outpatient Medications (Other)  Medication Sig   amLODipine  (NORVASC ) 10 MG tablet Take 1 tablet (10 mg total) by mouth daily.   aspirin  EC 81 MG tablet Take 81 mg by mouth daily. Swallow whole.   baclofen  (LIORESAL ) 10 MG tablet Take 0.5-1 tablets (5-10 mg total) by mouth 3 (three) times daily as needed for muscle spasms. (Patient not taking: Reported on 06/28/2023)   cyanocobalamin  (VITAMIN B12) 1000  MCG tablet Take 1 tablet (1,000 mcg total) by mouth daily. (Patient not taking: Reported on 06/28/2023)   empagliflozin (JARDIANCE) 25 MG TABS tablet Take 25 mg by mouth daily.   escitalopram  (LEXAPRO ) 20 MG tablet Take 1 tablet (20 mg total) by mouth daily.   folic acid  (FOLVITE ) 1 MG tablet Take 1 mg by mouth See admin instructions. Take 1 mg daily except skip dose on Wednesdays (methotrexate day)   furosemide  (LASIX ) 40 MG tablet TAKE 1 TABLET BY MOUTH ONCE DAILY   gabapentin  (NEURONTIN ) 100 MG capsule Take 1 capsule (100 mg total) by mouth 2 (two) times daily.   HYDROcodone -acetaminophen  (NORCO/VICODIN) 5-325 MG tablet Take 1 tablet by mouth every 6 (six) hours as needed.   lisinopril  (ZESTRIL ) 20 MG tablet Take 1 tablet by mouth 2 (two) times daily.   loratadine  (CLARITIN ) 10 MG tablet Take 10 mg by mouth every morning.   metFORMIN  (GLUCOPHAGE ) 500 MG tablet TAKE 2 TABLETS BY MOUTH ONCE EVERY MORNING WITH BREAKFAST AND 1 TABLET ONCEEVERY EVENING WITH DINNER   methotrexate (RHEUMATREX) 2.5 MG tablet Take 10 mg by mouth once a week. On Wednesdays   metoprolol  tartrate (LOPRESSOR ) 25 MG tablet Take 1 tablet (25 mg total) by mouth 2 (two) times daily.   nitroGLYCERIN  (NITROSTAT ) 0.4 MG SL tablet Place 0.4 mg under the tongue every 5 (five) minutes as needed for chest pain.   REPATHA  SURECLICK 140 MG/ML SOAJ Inject  140 mg as directed every 14 (fourteen) days.   TRULICITY  3 MG/0.5ML SOPN INJECT 3 MG (0.5 ML) UNDER THE SKIN ONCE A WEEK   No current facility-administered medications for this visit. (Other)   REVIEW OF SYSTEMS: ROS   Positive for: Neurological, Musculoskeletal, Endocrine, Cardiovascular, Eyes Negative for: Constitutional, Gastrointestinal, Skin, Genitourinary, HENT, Respiratory, Psychiatric, Allergic/Imm, Heme/Lymph Last edited by Elnor Avelina RAMAN, COT on 09/16/2023  9:21 AM.      ALLERGIES Allergies  Allergen Reactions   Naproxen Swelling and Anaphylaxis   Rosuvastatin  Other  (See Comments)   PAST MEDICAL HISTORY Past Medical History:  Diagnosis Date   Anxiety    Arthritis    Cancer (HCC)    skin: head   Coronary artery disease    Diabetes mellitus without complication (HCC)    Dyspnea    History of kidney stones    Hypertension    Hypertensive retinopathy    Myocardial infarction [facility])    Pneumonia    Postoperative nausea 11/27/2019   Past Surgical History:  Procedure Laterality Date   BIOPSY  08/27/2022   Procedure: BIOPSY;  Surgeon: Maryruth Ole DASEN, MD;  Location: [facility];  Service: Endoscopy;;   CATARACT EXTRACTION     CHOLECYSTECTOMY     COLONOSCOPY WITH PROPOFOL  N/A 08/27/2022   Procedure: COLONOSCOPY WITH PROPOFOL ;  Surgeon: Maryruth Ole DASEN, MD;  Location: [facility] END[facility];  Service: Endoscopy;  Laterality: N/A;   CORONARY ANGIOPLASTY WITH STENT PLACEMENT N/A 02/04/2006   ESOPHAGOGASTRODUODENOSCOPY (EGD) WITH PROPOFOL  N/A 08/27/2022   Procedure: ESOPHAGOGASTRODUODENOSCOPY (EGD) WITH PROPOFOL ;  Surgeon: Maryruth Ole DASEN, MD;  Location: [facility];  Service: Endoscopy;  Laterality: N/A;   EYE SURGERY     FOOT SURGERY Left    DOS 8.1.14 HALLIX IPJ FUSION, SECOND MET OSTEOTOMY W/SCREW, HAM TOE REPAIR 2,,4 , PARTIAL AMP 3RD DIGIT    GALLBLADDER SURGERY     HEMOSTASIS CLIP PLACEMENT  08/27/2022   Procedure: HEMOSTASIS CLIP PLACEMENT;  Surgeon: Maryruth Ole DASEN, MD;  Location: [facility];  Service: Endoscopy;;   IR URETERAL STENT RIGHT NEW ACCESS W/O SEP NEPHROSTOMY CATH  09/01/2023   KNEE SURERY Right    LEFT HEART CATH AND CORONARY ANGIOGRAPHY Left 11/17/2019   Procedure: LEFT HEART CATH AND CORONARY ANGIOGRAPHY;  Surgeon: Bosie Vinie LABOR, MD;  Location: [facility];  Service: Cardiovascular;  Laterality: Left;   MYRINGOTOMY WITH TUBE PLACEMENT Right 10/25/2021   Procedure: MYRINGOTOMY WITH BUTTERFLY TUBE PLACEMENT;  Surgeon: Edda Mt, MD;  [facility];  Service: ENT;  Laterality: Right;    NEPHROLITHOTOMY Right 09/01/2023   Procedure: NEPHROLITHOTOMY PERCUTANEOUS;  Surgeon: Carolee Sherwood JONETTA Aveer, MD;  Location: [facility];  Service: Urology;  Laterality: Right;   POLYPECTOMY  08/27/2022   Procedure: POLYPECTOMY;  Surgeon: Maryruth Ole DASEN, MD;  Location: [facility];  Service: Endoscopy;;   SUBMUCOSAL LIFTING INJECTION  08/27/2022   Procedure: SUBMUCOSAL LIFTING INJECTION;  Surgeon: Maryruth Ole DASEN, MD;  Location: [facility];  Service: Endoscopy;;  ELEVIEW ASCENDING COLON POLYP   TOTAL KNEE ARTHROPLASTY Right 08/30/2014   Procedure: TOTAL KNEE ARTHROPLASTY;  Surgeon: Ozell Flake, MD;  Location: [facility];  Service: Orthopedics;  Laterality: Right;   FAMILY HISTORY Family History  Problem Relation Age of Onset   Heart attack Mother    Diabetes Father    Hypertension Father    Heart attack Sister     SOCIAL HISTORY Social History   Tobacco Use   Smoking status: Former  Current packs/day: 0.00    Types: Cigarettes    Quit date: 08/16/2004    Years since quitting: 19.1   Smokeless tobacco: Former    Quit date: 2006   Tobacco comments:    quit 20 plus years  Vaping Use   Vaping status: Never Used  Substance Use Topics   Alcohol use: Not Currently    Alcohol/week: 2.0 standard drinks of alcohol    Types: 2 Cans of beer per week    Comment: SOCIAL   Drug use: No       OPHTHALMIC EXAM:  Base Eye Exam     Visual Acuity (Snellen - Linear)       Right Left   Dist cc 20/20 -1 20/40 -2    Correction: Glasses         Tonometry (Tonopen, 9:34 AM)       Right Left   Pressure 18 18         Pupils       Pupils Dark Light Shape React APD   Right PERRL 3 2 Round Brisk None   Left PERRL 3 2 Round Brisk None         Visual Fields       Left Right    Full Full         Extraocular Movement       Right Left    Full, Ortho Full, Ortho         Neuro/Psych     Oriented x3: Yes   Mood/Affect: Normal         Dilation     Both  eyes: 1.0% Mydriacyl, 2.5% Phenylephrine  @ 9:34 AM           Slit Lamp and Fundus Exam     Slit Lamp Exam       Right Left   Lids/Lashes Dermato Dermato, mild MGD   Conjunctiva/Sclera White and quiet White and quiet, mild nasal and temporal pinguecula   Cornea Arcus, trace PEE, tear film debris, well healed cataract wound Arcus, 1+PEE, trace tear film debris, well healed cataract would   Anterior Chamber Deep and quiet Deep and quiet   Iris Round and dilated, no NVI, TID @ 0300 limbus--patent LPI Round and dilated, no NVI, TID @ 0900 limbus--patent LPI   Lens PCIOL in good position, 1+ non central PCO PCIOL in good position   Anterior Vitreous Synerisis Synerisis         Fundus Exam       Right Left   Disc Mild pallor, sharp rim Pink, sharp, +fine vascular loops, +hyperemia   C/D Ratio 0.4 0.6   Macula Flat, good foveal reflex, RPE mottling, no heme or edema Blunted foveal reflex, scattered IRH/DBH greatest temporal to fovea, +edema, mild ERM w/central lamellar hole   Vessels Attenuated, Tortuous, AV crossing changes Attenuated, tortuous, +AV crossing changs, dilated venules--CRVO   Periphery Attached, no heme, mild reticular degeneration Attached, mild 360 MA/DBH           Refraction     Wearing Rx       Sphere Cylinder Axis Add   Right -0.50 +2.00 018 +2.25   Left -1.50 +1.50 167 +2.25    Age: 16 years   Type: Progressive         Manifest Refraction (Subjective)       Sphere Cylinder Axis Dist VA   Right       Left -1.25 +1.25 175 NI  IMAGING AND PROCEDURES  Imaging and Procedures for 09/16/2023  OCT, Retina - OU - Both Eyes       Right Eye Quality was good. Central Foveal Thickness: 234. Progression has been stable. Findings include normal foveal contour, no IRF, no SRF (Partial PVD).   Left Eye Quality was good. Central Foveal Thickness: 288. Progression has worsened. Findings include no SRF, abnormal foveal contour, intraretinal  hyper-reflective material, epiretinal membrane, intraretinal fluid, lamellar hole, outer retinal atrophy (ERM w/lamellar hole, central atrophy, Interval increase in temporal cystic changes/edema).   Notes *Images captured and stored on drive  Diagnosis / Impression:  OD: NFP, No IRF/SRF, Partial PVD OS: ERM w/lamellar hole, central atrophy, Interval increase in temporal cystic changes/edema  Clinical management:  See below  Abbreviations: NFP - Normal foveal profile. CME - cystoid macular edema. PED - pigment epithelial detachment. IRF - intraretinal fluid. SRF - subretinal fluid. EZ - ellipsoid zone. ERM - epiretinal membrane. ORA - outer retinal atrophy. ORT - outer retinal tubulation. SRHM - subretinal hyper-reflective material. IRHM - intraretinal hyper-reflective material      Intravitreal Injection, Pharmacologic Agent - OS - Left Eye       Time Out 09/16/2023. 10:51 AM. Confirmed correct patient, procedure, site, and patient consented.   Anesthesia Topical anesthesia was used. Anesthetic medications included Lidocaine  2%, Proparacaine 0.5%.   Procedure Preparation included 5% betadine to ocular surface, eyelid speculum. A supplied needle was used.   Injection: 1.25 mg Bevacizumab  1.25mg /0.41ml   Route: Intravitreal, Site: Left Eye   NDC: 49757-939-98, Lot: [id number], Expiration date: 09/29/2023   Post-op Post injection exam found visual acuity of at least counting fingers. The patient tolerated the procedure well. There were no complications. The patient received written and verbal post procedure care education. Post injection medications were not given.             ASSESSMENT/PLAN:    ICD-10-CM   1. Central retinal vein occlusion with macular edema of left eye  H34.8120 OCT, Retina - OU - Both Eyes    Intravitreal Injection, Pharmacologic Agent - OS - Left Eye    Bevacizumab  (AVASTIN ) SOLN 1.25 mg    2. Essential hypertension  I10     3. Hypertensive retinopathy of  both eyes  H35.033     4. Epiretinal membrane (ERM) of left eye  H35.372     5. Lamellar macular hole of left eye  H35.342     6. Diabetes mellitus type 2 without retinopathy (HCC)  E11.9     7. Pseudophakia of both eyes  Z96.1     8. Diabetes mellitus treated with injections of non-insulin  medication (HCC)  E11.9    Z79.85      **pt lost to retina f/u from 01.04.23 to 08.12.25 -- referred back by Dr. Mevelyn**  1. Remote CRVO w/ CME, OS  - initially, pt reported progressive decline in vision OS, first noted ~6-7 mos prior to presentation  - waited for regularly scheduled f/u with Dr. Mevelyn for evaluation  - pt w/ significant cardiovascular history -- MI in 2008, s/p CABG Oct 2021; pt reports spikes in BP ~3 mos ago (180s/110s), pt has had 5 bypasses in total.              - FA 10.12.22 shows remote RVO w/ CME.  Cluster of perivascular, perifoveal leakage temporal macula--?Partial petaloid pattern  - s/p IVA OS #1 (10.12.22), #2 (11.09.22), #3 (12.06.22), IVA OS #4 (01.04.23)  - Pt lost to f/u  from Jan 2023 to Aug 2025 - attributed to other health issues.   - BCVA OS 20/40 from 20/50              - OCT OS shows ERM w/lamellar hole, central atrophy, Interval increase in temporal cystic changes/edema  - recommend IVA OS #5 today, 08.12.25 w/ f/u in 4 wks - RBA of procedure discussed, questions answered - IVA informed consent obtained and signed 08.12.25 - see procedure note  - f/u 4 wks -- DFE/OCT, possible injection  2,3. Hypertensive retinopathy OU - discussed importance of tight BP control and relation to RVO - monitor   4,5. Epiretinal membrane w/ lamellar hole OS - mild ERM w/ lamellar hole - BCVA 20/50+ -- limited mostly by remote CRVO w/ central atrophy - no indication for surgery at this time - monitor for now  6. Diabetes mellitus, type 2 without retinopathy  - A1c 6.5 on 5.24.25, 6.2 on 7.27.22  - OS with scattered IRH/DBH -- mostly from CRVO  - OD w/o heme  -  no DME OU - monitor OU  7. Pseudophakia OU  - s/p CE/IOL OU  - IOL in good position, doing well  - monitor   Ophthalmic Meds Ordered this visit:  Meds ordered this encounter  Medications   Bevacizumab  (AVASTIN ) SOLN 1.25 mg     Return in about 4 weeks (around 10/14/2023) for CRVO OS, DFE, OCT, Possible Injxn.  There are no Patient Instructions on file for this visit.  Explained the diagnoses, plan, and follow up with the patient and they expressed understanding.  Patient expressed understanding of the importance of proper follow up care.   This document serves as a record of services personally performed by Redell JUDITHANN Hans, MD, PhD. It was created on their behalf by Almetta Pesa, an ophthalmic technician. The creation of this record is the provider's dictation and/or activities during the visit.    Electronically signed by: Almetta Pesa, OA, 09/18/23  1:56 AM  Redell JUDITHANN Hans, M.D., Ph.D. Diseases & Surgery of the Retina and Vitreous [facility]  I have reviewed the above documentation for accuracy and completeness, and I agree with the above. Redell JUDITHANN Hans, M.D., Ph.D. 09/18/23 2:02 AM    Abbreviations: M myopia (nearsighted); A astigmatism; H hyperopia (farsighted); P presbyopia; Mrx spectacle prescription;  CTL contact lenses; OD right eye; OS left eye; OU both eyes  XT exotropia; ET esotropia; PEK punctate epithelial keratitis; PEE punctate epithelial erosions; DES dry eye syndrome; MGD meibomian gland dysfunction; ATs artificial tears; PFAT's preservative free artificial tears; NSC nuclear sclerotic cataract; PSC posterior subcapsular cataract; ERM epi-retinal membrane; PVD posterior vitreous detachment; RD retinal detachment; DM diabetes mellitus; DR diabetic retinopathy; NPDR non-proliferative diabetic retinopathy; PDR proliferative diabetic retinopathy; CSME clinically significant macular edema; DME diabetic macular edema; dbh dot blot  hemorrhages; CWS cotton wool spot; POAG primary open angle glaucoma; C/D cup-to-disc ratio; HVF humphrey visual field; GVF goldmann visual field; OCT optical coherence tomography; IOP intraocular pressure; BRVO Branch retinal vein occlusion; CRVO central retinal vein occlusion; CRAO central retinal artery occlusion; BRAO branch retinal artery occlusion; RT retinal tear; SB scleral buckle; PPV pars plana vitrectomy; VH Vitreous hemorrhage; PRP panretinal laser photocoagulation; IVK intravitreal kenalog; VMT vitreomacular traction; MH Macular hole;  NVD neovascularization of the disc; NVE neovascularization elsewhere; AREDS age related eye disease study; ARMD age related macular degeneration; POAG primary open angle glaucoma; EBMD epithelial/anterior basement membrane dystrophy; ACIOL anterior chamber intraocular lens; IOL  intraocular lens; PCIOL posterior chamber intraocular lens; Phaco/IOL phacoemulsification with intraocular lens placement; PRK photorefractive keratectomy; LASIK laser assisted in situ keratomileusis; HTN hypertension; DM diabetes mellitus; COPD chronic obstructive pulmonary disease

## 2023-09-17 DIAGNOSIS — M86472 Chronic osteomyelitis with draining sinus, left ankle and foot: Secondary | ICD-10-CM | POA: Diagnosis not present

## 2023-09-17 DIAGNOSIS — L97522 Non-pressure chronic ulcer of other part of left foot with fat layer exposed: Secondary | ICD-10-CM | POA: Diagnosis not present

## 2023-09-17 DIAGNOSIS — L2989 Other pruritus: Secondary | ICD-10-CM | POA: Diagnosis not present

## 2023-09-17 DIAGNOSIS — S90222A Contusion of left lesser toe(s) with damage to nail, initial encounter: Secondary | ICD-10-CM | POA: Diagnosis not present

## 2023-09-17 DIAGNOSIS — T63481A Toxic effect of venom of other arthropod, accidental (unintentional), initial encounter: Secondary | ICD-10-CM | POA: Diagnosis not present

## 2023-09-17 DIAGNOSIS — L0889 Other specified local infections of the skin and subcutaneous tissue: Secondary | ICD-10-CM | POA: Diagnosis not present

## 2023-09-24 DIAGNOSIS — N200 Calculus of kidney: Secondary | ICD-10-CM | POA: Diagnosis not present

## 2023-10-02 NOTE — Progress Notes (Signed)
 [facility] - Clinic Note  10/14/2023     CHIEF COMPLAINT Patient presents for Retina Follow Up    HISTORY OF PRESENT ILLNESS: Kevin Perry is a 73 y.o. male who presents to the clinic today for:   HPI     Retina Follow Up   Patient presents with  CRVO/BRVO.  In left eye.  This started 4 weeks ago.        Comments   Patient here for 4 weeks retina follow up for CRVO OS. Patient states vision night be a little better. No eye pain.       Last edited by [name] RAMAN, COA on 10/14/2023  2:19 PM.     Patient feels the vision is doing well.   Referring physician: [name], DO [address] [place],  [place] [address]  HISTORICAL INFORMATION:  Selected notes from the MEDICAL RECORD NUMBER Referred by Dr. CHARM [name] for decreased vision and macular edema OS   CURRENT MEDICATIONS: No current outpatient medications on file. (Ophthalmic Drugs)   No current facility-administered medications for this visit. (Ophthalmic Drugs)   Current Outpatient Medications (Other)  Medication Sig   amLODipine  (NORVASC ) 10 MG tablet Take 1 tablet (10 mg total) by mouth daily.   empagliflozin (JARDIANCE) 25 MG TABS tablet Take 25 mg by mouth daily.   escitalopram  (LEXAPRO ) 20 MG tablet Take 1 tablet (20 mg total) by mouth daily.   folic acid  (FOLVITE ) 1 MG tablet Take 1 mg by mouth See admin instructions. Take 1 mg daily except skip dose on Wednesdays (methotrexate day)   furosemide  (LASIX ) 40 MG tablet TAKE 1 TABLET BY MOUTH ONCE DAILY   gabapentin  (NEURONTIN ) 100 MG capsule Take 1 capsule (100 mg total) by mouth 2 (two) times daily.   HYDROcodone -acetaminophen  (NORCO/VICODIN) 5-325 MG tablet Take 1 tablet by mouth every 6 (six) hours as needed.   lisinopril  (ZESTRIL ) 20 MG tablet Take 1 tablet by mouth 2 (two) times daily.   loratadine  (CLARITIN ) 10 MG tablet Take 10 mg by mouth every morning.   metFORMIN  (GLUCOPHAGE ) 500 MG tablet TAKE 2 TABLETS BY  MOUTH ONCE EVERY MORNING WITH BREAKFAST AND 1 TABLET ONCEEVERY EVENING WITH DINNER   methotrexate (RHEUMATREX) 2.5 MG tablet Take 10 mg by mouth once a week. On Wednesdays   metoprolol  tartrate (LOPRESSOR ) 25 MG tablet Take 1 tablet (25 mg total) by mouth 2 (two) times daily.   nitroGLYCERIN  (NITROSTAT ) 0.4 MG SL tablet Place 0.4 mg under the tongue every 5 (five) minutes as needed for chest pain.   REPATHA  SURECLICK 140 MG/ML SOAJ Inject 140 mg as directed every 14 (fourteen) days.   TRULICITY  3 MG/0.5ML SOPN INJECT 3 MG (0.5 ML) UNDER THE SKIN ONCE A WEEK   aspirin  EC 81 MG tablet Take 81 mg by mouth daily. Swallow whole.   No current facility-administered medications for this visit. (Other)   REVIEW OF SYSTEMS: ROS   Positive for: Neurological, Musculoskeletal, Endocrine, Cardiovascular, Eyes Negative for: Constitutional, Gastrointestinal, Skin, Genitourinary, HENT, Respiratory, Psychiatric, Allergic/Imm, Heme/Lymph Last edited by [name] RAMAN, COA on 10/14/2023  2:19 PM.       ALLERGIES Allergies  Allergen Reactions   Naproxen Swelling and Anaphylaxis   Rosuvastatin  Other (See Comments)   PAST MEDICAL HISTORY Past Medical History:  Diagnosis Date   Anxiety    Arthritis    Cancer (HCC)    skin: head   Coronary artery disease    Diabetes  mellitus without complication (HCC)    Dyspnea    History of kidney stones    Hypertension    Hypertensive retinopathy    Myocardial infarction [facility])    Pneumonia    Postoperative nausea 11/27/2019   Past Surgical History:  Procedure Laterality Date   BIOPSY  08/27/2022   Procedure: BIOPSY;  Surgeon: Maryruth Ole DASEN, MD;  Location: [facility];  Service: Endoscopy;;   CATARACT EXTRACTION     CHOLECYSTECTOMY     COLONOSCOPY WITH PROPOFOL  N/A 08/27/2022   Procedure: COLONOSCOPY WITH PROPOFOL ;  Surgeon: Maryruth Ole DASEN, MD;  Location: [facility];  Service: Endoscopy;  Laterality: N/A;   CORONARY ANGIOPLASTY WITH STENT  PLACEMENT N/A 02/04/2006   ESOPHAGOGASTRODUODENOSCOPY (EGD) WITH PROPOFOL  N/A 08/27/2022   Procedure: ESOPHAGOGASTRODUODENOSCOPY (EGD) WITH PROPOFOL ;  Surgeon: Maryruth Ole DASEN, MD;  Location: [facility];  Service: Endoscopy;  Laterality: N/A;   EYE SURGERY     FOOT SURGERY Left    DOS 8.1.14 HALLIX IPJ FUSION, SECOND MET OSTEOTOMY W/SCREW, HAM TOE REPAIR 2,,4 , PARTIAL AMP 3RD DIGIT    GALLBLADDER SURGERY     HEMOSTASIS CLIP PLACEMENT  08/27/2022   Procedure: HEMOSTASIS CLIP PLACEMENT;  Surgeon: Maryruth Ole DASEN, MD;  Location: [facility];  Service: Endoscopy;;   IR URETERAL STENT RIGHT NEW ACCESS W/O SEP NEPHROSTOMY CATH  09/01/2023   KNEE SURERY Right    LEFT HEART CATH AND CORONARY ANGIOGRAPHY Left 11/17/2019   Procedure: LEFT HEART CATH AND CORONARY ANGIOGRAPHY;  Surgeon: Bosie Vinie LABOR, MD;  Location: [facility];  Service: Cardiovascular;  Laterality: Left;   MYRINGOTOMY WITH TUBE PLACEMENT Right 10/25/2021   Procedure: MYRINGOTOMY WITH BUTTERFLY TUBE PLACEMENT;  Surgeon: Edda Mt, MD;  Location: [facility];  Service: ENT;  Laterality: Right;   NEPHROLITHOTOMY Right 09/01/2023   Procedure: NEPHROLITHOTOMY PERCUTANEOUS;  Surgeon: Carolee Sherwood JONETTA Hason, MD;  Location: [facility];  Service: Urology;  Laterality: Right;   POLYPECTOMY  08/27/2022   Procedure: POLYPECTOMY;  Surgeon: Maryruth Ole DASEN, MD;  Location: [facility];  Service: Endoscopy;;   SUBMUCOSAL LIFTING INJECTION  08/27/2022   Procedure: SUBMUCOSAL LIFTING INJECTION;  Surgeon: Maryruth Ole DASEN, MD;  Location: [facility];  Service: Endoscopy;;  ELEVIEW ASCENDING COLON POLYP   TOTAL KNEE ARTHROPLASTY Right 08/30/2014   Procedure: TOTAL KNEE ARTHROPLASTY;  Surgeon: Ozell Flake, MD;  Location: [facility];  Service: Orthopedics;  Laterality: Right;   FAMILY HISTORY Family History  Problem Relation Age of Onset   Heart attack Mother    Diabetes Father    Hypertension Father    Heart attack  Sister     SOCIAL HISTORY Social History   Tobacco Use   Smoking status: Former    Current packs/day: 0.00    Types: Cigarettes    Quit date: 08/16/2004    Years since quitting: 19.1   Smokeless tobacco: Former    Quit date: 2006   Tobacco comments:    quit 20 plus years  Vaping Use   Vaping status: Never Used  Substance Use Topics   Alcohol use: Not Currently    Alcohol/week: 2.0 standard drinks of alcohol    Types: 2 Cans of beer per week    Comment: SOCIAL   Drug use: No       OPHTHALMIC EXAM:  Base Eye Exam     Visual Acuity (Snellen - Linear)       Right Left   Dist cc 20/20 -1 20/70 -2   Dist ph  cc  20/50 -2    Correction: Glasses         Tonometry (Tonopen, 2:16 PM)       Right Left   Pressure 13 12         Pupils       Dark Light Shape React APD   Right 3 2 Round Brisk None   Left 3 2 Round Brisk None         Visual Fields (Counting fingers)       Left Right    Full Full         Extraocular Movement       Right Left    Full, Ortho Full, Ortho         Neuro/Psych     Oriented x3: Yes   Mood/Affect: Normal         Dilation     Both eyes: 1.0% Mydriacyl, 2.5% Phenylephrine  @ 2:16 PM           Slit Lamp and Fundus Exam     Slit Lamp Exam       Right Left   Lids/Lashes Dermato Dermato, mild MGD   Conjunctiva/Sclera White and quiet White and quiet, mild nasal and temporal pinguecula   Cornea Arcus, trace PEE, tear film debris, well healed cataract wound Arcus, 1+PEE, trace tear film debris, well healed cataract would   Anterior Chamber Deep and quiet Deep and quiet   Iris Round and dilated, no NVI, TID @ 0300 limbus--patent LPI Round and dilated, no NVI, TID @ 0900 limbus--patent LPI   Lens PCIOL in good position, 1+ non central PCO PCIOL in good position   Anterior Vitreous Synerisis Synerisis         Fundus Exam       Right Left   Disc Mild pallor, sharp rim Pink, sharp, +fine vascular loops, +hyperemia    C/D Ratio 0.4 0.6   Macula Flat, good foveal reflex, RPE mottling, no heme or edema Blunted foveal reflex, scattered IRH/DBH greatest temporal to fovea, +edema, mild ERM w/central lamellar hole   Vessels Attenuated, Tortuous, AV crossing changes Attenuated, tortuous, +AV crossing changs, dilated venules--CRVO   Periphery Attached, no heme, mild reticular degeneration Attached, mild 360 MA/DBH           Refraction     Wearing Rx       Sphere Cylinder Axis Add   Right -0.50 +2.00 018 +2.25   Left -1.50 +1.50 167 +2.25    Type: Progressive           IMAGING AND PROCEDURES  Imaging and Procedures for 10/14/2023           ASSESSMENT/PLAN:    ICD-10-CM   1. Central retinal vein occlusion with macular edema of left eye  H34.8120 OCT, Retina - OU - Both Eyes    2. Essential hypertension  I10     3. Hypertensive retinopathy of both eyes  H35.033     4. Epiretinal membrane (ERM) of left eye  H35.372     5. Lamellar macular hole of left eye  H35.342     6. Diabetes mellitus type 2 without retinopathy (HCC)  E11.9     7. Pseudophakia of both eyes  Z96.1       **pt lost to retina f/u from 01.04.23 to 08.12.25 -- referred back by Dr. Mevelyn**  1. Remote CRVO w/ CME, OS - initially, pt reported progressive decline in vision OS, first noted ~6-7 mos prior to  presentation  - waited for regularly scheduled f/u with Dr. Mevelyn for evaluation - pt w/ significant cardiovascular history -- MI in 2008, s/p CABG Oct 2021; pt reports spikes in BP ~3 mos ago (180s/110s), pt has had 5 bypasses in total.   - FA 10.12.22 shows remote RVO w/ CME. Cluster of perivascular, perifoveal leakage temporal macula--?Partial petaloid pattern - s/p IVA OS #1 (10.12.22), #2 (11.09.22), #3 (12.06.22), IVA OS #4 (01.04.23), #5 (08.12.25) - Pt lost to f/u from Jan 2023 to Aug 2025 - attributed to other health issues.   - BCVA OS 20/50   - OCT OS shows ERM w/lamellar hole, central atrophy,  Interval improvement in temporal cystic changes/edema  - recommend IVA OS #6 today, 09.09.25 w/ f/u in 4 wks - RBA of procedure discussed, questions answered - IVA informed consent obtained and signed 08.12.25 - see procedure note  - f/u 4 wks -- DFE/OCT, possible injection  2,3. Hypertensive retinopathy OU - discussed importance of tight BP control and relation to RVO - monitor   4,5. Epiretinal membrane w/ lamellar hole OS - mild ERM w/ lamellar hole - BCVA 20/50+ -- limited mostly by remote CRVO w/ central atrophy - no indication for surgery at this time - monitor for now  6. Diabetes mellitus, type 2 without retinopathy  - A1c 6.5 (5.24.25), 6.2 (7.27.22)  - OS with scattered IRH/DBH -- mostly from CRVO  - OD w/o heme  - no DME OU - monitor OU  7. Pseudophakia OU  - s/p CE/IOL OU  - IOL in good position, doing well  - monitor   Ophthalmic Meds Ordered this visit:  No orders of the defined types were placed in this encounter.    No follow-ups on file.  There are no Patient Instructions on file for this visit.  Explained the diagnoses, plan, and follow up with the patient and they expressed understanding.  Patient expressed understanding of the importance of proper follow up care.   This document serves as a record of services personally performed by Redell JUDITHANN Hans, MD, PhD. It was created on their behalf by Wanda GEANNIE Keens, COT an ophthalmic technician. The creation of this record is the provider's dictation and/or activities during the visit.    Electronically signed by:  Wanda GEANNIE Keens, COT  10/14/23 2:46 PM  Redell JUDITHANN Hans, M.D., Ph.D. Diseases & Surgery of the Retina and Vitreous [facility]    Abbreviations: M myopia (nearsighted); A astigmatism; H hyperopia (farsighted); P presbyopia; Mrx spectacle prescription;  CTL contact lenses; OD right eye; OS left eye; OU both eyes  XT exotropia; ET esotropia; PEK punctate epithelial  keratitis; PEE punctate epithelial erosions; DES dry eye syndrome; MGD meibomian gland dysfunction; ATs artificial tears; PFAT's preservative free artificial tears; NSC nuclear sclerotic cataract; PSC posterior subcapsular cataract; ERM epi-retinal membrane; PVD posterior vitreous detachment; RD retinal detachment; DM diabetes mellitus; DR diabetic retinopathy; NPDR non-proliferative diabetic retinopathy; PDR proliferative diabetic retinopathy; CSME clinically significant macular edema; DME diabetic macular edema; dbh dot blot hemorrhages; CWS cotton wool spot; POAG primary open angle glaucoma; C/D cup-to-disc ratio; HVF humphrey visual field; GVF goldmann visual field; OCT optical coherence tomography; IOP intraocular pressure; BRVO Branch retinal vein occlusion; CRVO central retinal vein occlusion; CRAO central retinal artery occlusion; BRAO branch retinal artery occlusion; RT retinal tear; SB scleral buckle; PPV pars plana vitrectomy; VH Vitreous hemorrhage; PRP panretinal laser photocoagulation; IVK intravitreal kenalog; VMT vitreomacular traction; MH Macular hole;  NVD  neovascularization of the disc; NVE neovascularization elsewhere; AREDS age related eye disease study; ARMD age related macular degeneration; POAG primary open angle glaucoma; EBMD epithelial/anterior basement membrane dystrophy; ACIOL anterior chamber intraocular lens; IOL intraocular lens; PCIOL posterior chamber intraocular lens; Phaco/IOL phacoemulsification with intraocular lens placement; PRK photorefractive keratectomy; LASIK laser assisted in situ keratomileusis; HTN hypertension; DM diabetes mellitus; COPD chronic obstructive pulmonary disease

## 2023-10-07 ENCOUNTER — Telehealth: Payer: Self-pay

## 2023-10-07 DIAGNOSIS — Z79899 Other long term (current) drug therapy: Secondary | ICD-10-CM | POA: Diagnosis not present

## 2023-10-07 DIAGNOSIS — L2089 Other atopic dermatitis: Secondary | ICD-10-CM | POA: Diagnosis not present

## 2023-10-07 DIAGNOSIS — L570 Actinic keratosis: Secondary | ICD-10-CM | POA: Diagnosis not present

## 2023-10-07 DIAGNOSIS — L578 Other skin changes due to chronic exposure to nonionizing radiation: Secondary | ICD-10-CM | POA: Diagnosis not present

## 2023-10-07 NOTE — Telephone Encounter (Signed)
 LM voicemail for Kevin Perry with the lab results requested.

## 2023-10-07 NOTE — Telephone Encounter (Signed)
 Copied from CRM [id number]. Topic: General - Other >> Oct 07, 2023  3:01 PM Sophia H wrote: Reason for CRM: Spoke with Jody with [facility]. States she is needing to know when last lipid panel was and what results were - please reach out # [phone] ext. [phone].  Vmail is secured so you are able to leave results if we have it.

## 2023-10-07 NOTE — Telephone Encounter (Signed)
 Okay to share results with them. Last lipid panel I see was from 01/2023  Results show  Total Cholesterol 172 Triglyceride 580 (high) HDL 29 (low) LDL - unable to calculate  However prior result from 07/2022 showed LDL at 77  He should be on Repatha  injection to control LDL.  Let me know if questions  Marsa Officer, DO [facility] 10/07/2023, 3:52 PM

## 2023-10-10 ENCOUNTER — Encounter: Payer: Self-pay | Admitting: Family Medicine

## 2023-10-13 ENCOUNTER — Ambulatory Visit (INDEPENDENT_AMBULATORY_CARE_PROVIDER_SITE_OTHER): Admitting: Family Medicine

## 2023-10-13 ENCOUNTER — Other Ambulatory Visit (HOSPITAL_COMMUNITY): Payer: Self-pay

## 2023-10-13 ENCOUNTER — Telehealth: Payer: Self-pay

## 2023-10-13 ENCOUNTER — Telehealth: Payer: Self-pay | Admitting: Family Medicine

## 2023-10-13 ENCOUNTER — Encounter: Payer: Self-pay | Admitting: Family Medicine

## 2023-10-13 VITALS — BP 124/70 | HR 55 | Ht 75.0 in | Wt 258.2 lb

## 2023-10-13 DIAGNOSIS — I10 Essential (primary) hypertension: Secondary | ICD-10-CM | POA: Diagnosis not present

## 2023-10-13 DIAGNOSIS — I2510 Atherosclerotic heart disease of native coronary artery without angina pectoris: Secondary | ICD-10-CM | POA: Diagnosis not present

## 2023-10-13 DIAGNOSIS — E1169 Type 2 diabetes mellitus with other specified complication: Secondary | ICD-10-CM | POA: Diagnosis not present

## 2023-10-13 DIAGNOSIS — Z7985 Long-term (current) use of injectable non-insulin antidiabetic drugs: Secondary | ICD-10-CM

## 2023-10-13 DIAGNOSIS — Z23 Encounter for immunization: Secondary | ICD-10-CM

## 2023-10-13 LAB — POCT GLYCOSYLATED HEMOGLOBIN (HGB A1C): Hemoglobin A1C: 6.7 % — AB (ref 4.0–5.6)

## 2023-10-13 NOTE — Telephone Encounter (Signed)
PA has been submitted, currently pending

## 2023-10-13 NOTE — Telephone Encounter (Signed)
 Pharmacy Patient Advocate Encounter   Received notification from Pt Calls Messages that prior authorization for Repatha  SureClick 140MG /ML auto-injectors is required/requested.   Insurance verification completed.   The patient is insured through [facility] [employer] .   Per test claim: PA required; PA submitted to above mentioned insurance via Latent Key/confirmation #/EOC [id number] Status is pending

## 2023-10-13 NOTE — Progress Notes (Unsigned)
 Subjective:    Patient ID: Kevin Perry, male    DOB: April 29, 1950, 73 y.o.   MRN: [id number]  Kevin Perry is a 73 y.o. male presenting on 10/13/2023 for Medical Management of Chronic Issues   HPI  CHRONIC DM, Type 2: - A1c improved to 6.5% on Jun 28, 2023, from 7.9% in December 2024 On [employer] C[employer] PAP program for Trulicity  3mg  weekly Meds: Trulicity  3mg  weekly, Metformin  2AM and 1 PM, Jardiance 25mg  daily Off Glipizide  Reports good compliance. Tolerating well w/o side-effects Currently on ACEi Lifestyle: - Diet (trying to improve diet reduce carb intake) - Exercise (walking limited now) Denies hypoglycemia, polyuria, visual changes, numbness or tingling.   CHRONIC HTN: Reports continues on meds doing well Current Meds - AMlodipine  10mg  daily, Lisinopril  20mg  TWICE A DAY, Metoprolol  25mg  TWICE A DAY    Reports good compliance, took meds today. Tolerating well, w/o complaints. Denies CP, dyspnea, HA, edema, dizziness / lightheadedness    Health Maintenance: ***     10/13/2023    9:38 AM 08/04/2023    9:42 AM 07/03/2023   11:29 AM  Depression screen PHQ 2/9  Decreased Interest 0 0 0  Down, Depressed, Hopeless 0 0 0  PHQ - 2 Score 0 0 0  Altered sleeping 0 0 0  Tired, decreased energy 0 0 0  Change in appetite 0 0 0  Feeling bad or failure about yourself  0 0 0  Trouble concentrating 0 0 0  Moving slowly or fidgety/restless 0 0 0  Suicidal thoughts 0 0 0  PHQ-9 Score 0 0 0  Difficult doing work/chores Not difficult at all         10/13/2023    9:38 AM 08/04/2023    9:42 AM 07/03/2023   11:29 AM 06/04/2023   10:03 AM  GAD 7 : Generalized Anxiety Score  Nervous, Anxious, on Edge 0 0 0 0  Control/stop worrying 0 0 0 0  Worry too much - different things 0 0 0 0  Trouble relaxing 0 0 0 0  Restless 0 0 0 0  Easily annoyed or irritable 0 0 0 0  Afraid - awful might happen 0 0 0 0  Total GAD 7 Score 0 0 0 0  Anxiety Difficulty Not difficult at all       Social  History   Tobacco Use   Smoking status: Former    Current packs/day: 0.00    Types: Cigarettes    Quit date: 08/16/2004    Years since quitting: 19.1   Smokeless tobacco: Former    Quit date: 2006   Tobacco comments:    quit 20 plus years  Vaping Use   Vaping status: Never Used  Substance Use Topics   Alcohol use: Not Currently    Alcohol/week: 2.0 standard drinks of alcohol    Types: 2 Cans of beer per week    Comment: SOCIAL   Drug use: No    Review of Systems Per HPI unless specifically indicated above     Objective:    BP 124/70 (BP Location: Left Arm, Patient Position: Sitting, Cuff Size: Normal)   Pulse (!) 55   Ht 6' 3 (1.905 m)   Wt 258 lb 3 oz (117.1 kg)   SpO2 94%   BMI 32.27 kg/m   Wt Readings from Last 3 Encounters:  10/13/23 258 lb 3 oz (117.1 kg)  09/01/23 245 lb (111.1 kg)  08/27/23 249 lb (112.9 kg)  Physical Exam  Results for orders placed or performed in visit on 10/13/23  POCT HgB A1C   Collection Time: 10/13/23  9:39 AM  Result Value Ref Range   Hemoglobin A1C 6.7 (A) 4.0 - 5.6 %   HbA1c POC (<> result, manual entry)     HbA1c, POC (prediabetic range)     HbA1c, POC (controlled diabetic range)        Assessment & Plan:   Problem List Items Addressed This Visit     Coronary artery disease involving native coronary artery of native heart without angina pectoris   Essential hypertension   Type 2 diabetes mellitus with other specified complication (HCC) - Primary   Relevant Orders   POCT HgB A1C (Completed)   Other Visit Diagnoses       Flu vaccine need       Relevant Orders   Flu vaccine HIGH DOSE PF(Fluzone Trivalent) (Completed)     Long-term current use of injectable noninsulin antidiabetic medication            ***  Orders Placed This Encounter  Procedures   Flu vaccine HIGH DOSE PF(Fluzone Trivalent)   POCT HgB A1C    No orders of the defined types were placed in this encounter.   Follow up plan: Return in  about 3 months (around 01/12/2024) for 3 month fasting lab > 1 week later Annual Physical.  Future labs ordered for ***   Marsa Officer, DO [facility] 10/13/2023, 9:51 AM

## 2023-10-13 NOTE — Telephone Encounter (Signed)
 Requesting assistance with PA for Repatha . Previously ordered in 07/2023 with refills, he has been receiving it until now last attempted pick up earlier Sept 2025 pharmacy says requires PA.  Patient missed med x 2 weeks now.  Let us  know if you need any other information. Thank you!  Marsa Officer, DO [facility] 10/13/2023, 9:55 AM

## 2023-10-13 NOTE — Patient Instructions (Addendum)
 Thank you for coming to the office today.  We are working on the [place] authorization for Repatha   Recent Labs    01/17/23 0917 06/28/23 1600 10/13/23 0939  HGBA1C 7.9* 6.5* 6.7*    DUE for FASTING BLOOD WORK (no food or drink after midnight before the lab appointment, only water or coffee without cream/sugar on the morning of)  SCHEDULE Lab Only visit in the morning at the clinic for lab draw in 3 MONTHS   - Make sure Lab Only appointment is at about 1 week before your next appointment, so that results will be available  For Lab Results, once available within 2-3 days of blood draw, you can can log in to MyChart online to view your results and a brief explanation. Also, we can discuss results at next follow-up visit.   Please schedule a Follow-up Appointment to: Return in about 3 months (around 01/12/2024) for 3 month fasting lab > 1 week later Annual Physical.  If you have any other questions or concerns, please feel free to call the office or send a message through MyChart. You may also schedule an earlier appointment if necessary.  Additionally, you may be receiving a survey about your experience at our office within a few days to 1 week by e-mail or mail. We value your feedback.  Marsa Officer, DO [facility], [place]

## 2023-10-13 NOTE — Telephone Encounter (Signed)
 I checked there was a previous PA from 03/20/23 through 09/17/23. It looks like this has lapsed for him. He would need new PA.  Thank you!!  Marsa Officer, DO [facility] 10/13/2023, 9:59 AM

## 2023-10-14 ENCOUNTER — Encounter (INDEPENDENT_AMBULATORY_CARE_PROVIDER_SITE_OTHER): Payer: Self-pay | Admitting: Ophthalmology

## 2023-10-14 ENCOUNTER — Ambulatory Visit (INDEPENDENT_AMBULATORY_CARE_PROVIDER_SITE_OTHER): Admitting: Ophthalmology

## 2023-10-14 ENCOUNTER — Other Ambulatory Visit: Payer: Self-pay | Admitting: Family Medicine

## 2023-10-14 DIAGNOSIS — H34812 Central retinal vein occlusion, left eye, with macular edema: Secondary | ICD-10-CM | POA: Diagnosis not present

## 2023-10-14 DIAGNOSIS — E119 Type 2 diabetes mellitus without complications: Secondary | ICD-10-CM

## 2023-10-14 DIAGNOSIS — H35372 Puckering of macula, left eye: Secondary | ICD-10-CM

## 2023-10-14 DIAGNOSIS — I10 Essential (primary) hypertension: Secondary | ICD-10-CM

## 2023-10-14 DIAGNOSIS — E559 Vitamin D deficiency, unspecified: Secondary | ICD-10-CM

## 2023-10-14 DIAGNOSIS — H35033 Hypertensive retinopathy, bilateral: Secondary | ICD-10-CM | POA: Diagnosis not present

## 2023-10-14 DIAGNOSIS — I2510 Atherosclerotic heart disease of native coronary artery without angina pectoris: Secondary | ICD-10-CM

## 2023-10-14 DIAGNOSIS — N138 Other obstructive and reflux uropathy: Secondary | ICD-10-CM

## 2023-10-14 DIAGNOSIS — Z961 Presence of intraocular lens: Secondary | ICD-10-CM

## 2023-10-14 DIAGNOSIS — E1169 Type 2 diabetes mellitus with other specified complication: Secondary | ICD-10-CM

## 2023-10-14 DIAGNOSIS — H35342 Macular cyst, hole, or pseudohole, left eye: Secondary | ICD-10-CM

## 2023-10-14 DIAGNOSIS — E538 Deficiency of other specified B group vitamins: Secondary | ICD-10-CM

## 2023-10-14 DIAGNOSIS — Z0000 Encounter for general adult medical examination without abnormal findings: Secondary | ICD-10-CM

## 2023-10-15 ENCOUNTER — Other Ambulatory Visit (HOSPITAL_COMMUNITY): Payer: Self-pay

## 2023-10-16 ENCOUNTER — Encounter (INDEPENDENT_AMBULATORY_CARE_PROVIDER_SITE_OTHER): Payer: Self-pay | Admitting: Ophthalmology

## 2023-10-16 MED ORDER — BEVACIZUMAB CHEMO INJECTION 1.25MG/0.05ML SYRINGE FOR KALEIDOSCOPE
1.2500 mg | INTRAVITREAL | Status: AC | PRN
  Administered 2023-10-16: 1.25 mg via INTRAVITREAL

## 2023-10-22 NOTE — Progress Notes (Signed)
 Devere,  Would you be able to please assist with appealing this PA denial for Repatha  from [employer]? From review of denial dated 10/14/2023, continuation of therapy PA was denied as patient did not have evidence of reduction in LDL levels from baseline. Lipid panel has not been rechecked since started Repatha  in December 2024.   Unfortunately, at this point, if we recheck LDL, would not expect level to reflect impact of Repatha  therapy as he has been out of Repatha  for several weeks now (last dose taken on 8/18). Could we appeal with request to restart therapy?   Patient with CAD s/p CABG 2021. Unable to tolerated statin therapy due to myopathy.   Thank you!  Sharyle Sia, PharmD, JAQUELINE, CPP Clinical Pharmacist [facility] [phone]

## 2023-10-23 ENCOUNTER — Telehealth: Payer: Self-pay | Admitting: Pharmacist

## 2023-10-23 ENCOUNTER — Other Ambulatory Visit (HOSPITAL_COMMUNITY): Payer: Self-pay

## 2023-10-23 NOTE — Telephone Encounter (Signed)
 Expedited Appeal has been submitted. Will advise when response is received.  Completed form and supporting clinical documentation have been faxed to [phone] on 10/23/2023 @10 :46 am.  Thank you, Devere Pandy, PharmD Clinical Pharmacist  [place]  Direct Dial: [phone]

## 2023-10-24 ENCOUNTER — Other Ambulatory Visit (INDEPENDENT_AMBULATORY_CARE_PROVIDER_SITE_OTHER): Admitting: Pharmacist

## 2023-10-24 DIAGNOSIS — I2510 Atherosclerotic heart disease of native coronary artery without angina pectoris: Secondary | ICD-10-CM

## 2023-10-24 NOTE — Progress Notes (Signed)
   10/24/2023  Patient ID: Kevin Perry, male   DOB: June 02, 1950, 73 y.o.   MRN: [id number]  Received a voicemail from patient on 10/22/2023 requesting assistance with medication access for his Repatha . Patient shared that he was waiting on a new prior authorization for this medication. From review of chart, noted that latest PA had been denied on 10/14/2023 as patient did not have evidence of reduction in LDL levels from baseline.  - Collaborated with Pharmacist Devere Pandy who submitted an expedited appeal for patient's Repatha  requesting to restart therapy  Receive message from Pharmacist Devere Pandy today advising that appeal for PA for Repatha  was approved through 10/22/2024 - Follow up with RPh Sam at [facility]. Confirms claim for Repatha  now going through insurance for patient for no charge. - Follow up with patient to provide update. Patient confirms that he will follow up with [facility] to pick up and restart Repatha   Note patient scheduled to return for follow up lipid panel on 01/13/2024 - Will provide update to PCP.  Sharyle Sia, PharmD, JAQUELINE, CPP Clinical Pharmacist [facility] [phone]

## 2023-10-24 NOTE — Telephone Encounter (Signed)
 Insurance has approved the appeal for Repatha  through 10/22/2024, full letter can be found under the media tab.    Thank you, Devere Pandy, PharmD Clinical Pharmacist  [place]  Direct Dial: [phone]

## 2023-10-24 NOTE — Patient Instructions (Signed)
 Please follow up with [facility] to pick up and restart your Repatha  as new prior authorization has been approved.  [address] [facility] [address] [place], [place] [address] [phone]  Please let me know if you have any further medication questions or concerns!  Sharyle Sia, PharmD, JAQUELINE, CPP Clinical Pharmacist [facility] [phone]

## 2023-10-28 NOTE — Progress Notes (Signed)
 [facility] - Clinic Note  11/11/2023     CHIEF COMPLAINT Patient presents for Retina Follow Up    HISTORY OF PRESENT ILLNESS: Kevin Perry is a 73 y.o. male who presents to the clinic today for:   HPI     Retina Follow Up   Patient presents with  CRVO/BRVO.  In left eye.  This started 3.5 years ago.  Severity is moderate.  Duration of 4 weeks.  I, the attending physician,  performed the HPI with the patient and updated documentation appropriately.        Comments   Pt states no improvements in vision. Pt denies FOL/floaters/pain. Pt does not use ATS.      Last edited by Valdemar Rogue, MD on 11/11/2023  8:30 PM.      Patient states he's doing well, no  changes he's noticed vision wise.   Referring physician: [name], DO [address] [place],  [place] [address]  HISTORICAL INFORMATION:  Selected notes from the MEDICAL RECORD NUMBER Referred by Dr. CHARM [name] for decreased vision and macular edema OS   CURRENT MEDICATIONS: No current outpatient medications on file. (Ophthalmic Drugs)   No current facility-administered medications for this visit. (Ophthalmic Drugs)   Current Outpatient Medications (Other)  Medication Sig   amLODipine  (NORVASC ) 10 MG tablet Take 1 tablet (10 mg total) by mouth daily.   aspirin  EC 81 MG tablet Take 81 mg by mouth daily. Swallow whole.   empagliflozin (JARDIANCE) 25 MG TABS tablet Take 25 mg by mouth daily.   escitalopram  (LEXAPRO ) 20 MG tablet Take 1 tablet (20 mg total) by mouth daily.   folic acid  (FOLVITE ) 1 MG tablet Take 1 mg by mouth See admin instructions. Take 1 mg daily except skip dose on Wednesdays (methotrexate day)   furosemide  (LASIX ) 40 MG tablet TAKE 1 TABLET BY MOUTH ONCE DAILY   gabapentin  (NEURONTIN ) 100 MG capsule Take 1 capsule (100 mg total) by mouth 2 (two) times daily.   HYDROcodone -acetaminophen  (NORCO/VICODIN) 5-325 MG tablet Take 1 tablet by mouth every 6 (six) hours as  needed.   lisinopril  (ZESTRIL ) 20 MG tablet Take 1 tablet by mouth 2 (two) times daily.   loratadine  (CLARITIN ) 10 MG tablet Take 10 mg by mouth every morning.   metFORMIN  (GLUCOPHAGE ) 500 MG tablet TAKE 2 TABLETS BY MOUTH ONCE EVERY MORNING WITH BREAKFAST AND 1 TABLET ONCEEVERY EVENING WITH DINNER   methotrexate (RHEUMATREX) 2.5 MG tablet Take 10 mg by mouth once a week. On Wednesdays   metoprolol  tartrate (LOPRESSOR ) 25 MG tablet Take 1 tablet (25 mg total) by mouth 2 (two) times daily.   nitroGLYCERIN  (NITROSTAT ) 0.4 MG SL tablet Place 0.4 mg under the tongue every 5 (five) minutes as needed for chest pain.   REPATHA  SURECLICK 140 MG/ML SOAJ Inject 140 mg as directed every 14 (fourteen) days.   TRULICITY  3 MG/0.5ML SOPN INJECT 3 MG (0.5 ML) UNDER THE SKIN ONCE A WEEK   No current facility-administered medications for this visit. (Other)   REVIEW OF SYSTEMS: ROS   Positive for: Neurological, Musculoskeletal, Endocrine, Cardiovascular, Eyes Negative for: Constitutional, Gastrointestinal, Skin, Genitourinary, HENT, Respiratory, Psychiatric, Allergic/Imm, Heme/Lymph Last edited by Elnor Avelina RAMAN, COT on 11/11/2023  2:30 PM.      ALLERGIES Allergies  Allergen Reactions   Naproxen Swelling and Anaphylaxis   Rosuvastatin  Other (See Comments)   PAST MEDICAL HISTORY Past Medical History:  Diagnosis Date   Anxiety  Arthritis    Cancer (HCC)    skin: head   Coronary artery disease    Diabetes mellitus without complication (HCC)    Dyspnea    History of kidney stones    Hypertension    Hypertensive retinopathy    Myocardial infarction [facility])    Pneumonia    Postoperative nausea 11/27/2019   Past Surgical History:  Procedure Laterality Date   BIOPSY  08/27/2022   Procedure: BIOPSY;  Surgeon: Maryruth Ole DASEN, MD;  Location: [facility];  Service: Endoscopy;;   CATARACT EXTRACTION     CHOLECYSTECTOMY     COLONOSCOPY WITH PROPOFOL  N/A 08/27/2022   Procedure: COLONOSCOPY  WITH PROPOFOL ;  Surgeon: Maryruth Ole DASEN, MD;  Location: [facility];  Service: Endoscopy;  Laterality: N/A;   CORONARY ANGIOPLASTY WITH STENT PLACEMENT N/A 02/04/2006   ESOPHAGOGASTRODUODENOSCOPY (EGD) WITH PROPOFOL  N/A 08/27/2022   Procedure: ESOPHAGOGASTRODUODENOSCOPY (EGD) WITH PROPOFOL ;  Surgeon: Maryruth Ole DASEN, MD;  Location: [facility];  Service: Endoscopy;  Laterality: N/A;   EYE SURGERY     FOOT SURGERY Left    DOS 8.1.14 HALLIX IPJ FUSION, SECOND MET OSTEOTOMY W/SCREW, HAM TOE REPAIR 2,,4 , PARTIAL AMP 3RD DIGIT    GALLBLADDER SURGERY     HEMOSTASIS CLIP PLACEMENT  08/27/2022   Procedure: HEMOSTASIS CLIP PLACEMENT;  Surgeon: Maryruth Ole DASEN, MD;  Location: [facility];  Service: Endoscopy;;   IR URETERAL STENT RIGHT NEW ACCESS W/O SEP NEPHROSTOMY CATH  09/01/2023   KNEE SURERY Right    LEFT HEART CATH AND CORONARY ANGIOGRAPHY Left 11/17/2019   Procedure: LEFT HEART CATH AND CORONARY ANGIOGRAPHY;  Surgeon: Bosie Vinie LABOR, MD;  Location: [facility];  Service: Cardiovascular;  Laterality: Left;   MYRINGOTOMY WITH TUBE PLACEMENT Right 10/25/2021   Procedure: MYRINGOTOMY WITH BUTTERFLY TUBE PLACEMENT;  Surgeon: Edda Mt, MD;  Location: [facility];  Service: ENT;  Laterality: Right;   NEPHROLITHOTOMY Right 09/01/2023   Procedure: NEPHROLITHOTOMY PERCUTANEOUS;  Surgeon: Carolee Sherwood JONETTA Oris, MD;  Location: [facility];  Service: Urology;  Laterality: Right;   POLYPECTOMY  08/27/2022   Procedure: POLYPECTOMY;  Surgeon: Maryruth Ole DASEN, MD;  Location: [facility];  Service: Endoscopy;;   SUBMUCOSAL LIFTING INJECTION  08/27/2022   Procedure: SUBMUCOSAL LIFTING INJECTION;  Surgeon: Maryruth Ole DASEN, MD;  Location: [facility];  Service: Endoscopy;;  ELEVIEW ASCENDING COLON POLYP   TOTAL KNEE ARTHROPLASTY Right 08/30/2014   Procedure: TOTAL KNEE ARTHROPLASTY;  Surgeon: Ozell Flake, MD;  Location: [facility];  Service: Orthopedics;  Laterality: Right;    FAMILY HISTORY Family History  Problem Relation Age of Onset   Heart attack Mother    Diabetes Father    Hypertension Father    Heart attack Sister     SOCIAL HISTORY Social History   Tobacco Use   Smoking status: Former    Current packs/day: 0.00    Types: Cigarettes    Quit date: 08/16/2004    Years since quitting: 19.2   Smokeless tobacco: Former    Quit date: 2006   Tobacco comments:    quit 20 plus years  Vaping Use   Vaping status: Never Used  Substance Use Topics   Alcohol use: Not Currently    Alcohol/week: 2.0 standard drinks of alcohol    Types: 2 Cans of beer per week    Comment: SOCIAL   Drug use: No       OPHTHALMIC EXAM:  Base Eye Exam     Visual Acuity (Snellen - Linear)  Right Left   Dist cc 20/20 20/70 -3   Dist ph cc  20/60 -3    Correction: Glasses         Tonometry (Tonopen, 2:36 PM)       Right Left   Pressure 15 15         Pupils       Pupils Dark Light Shape React APD   Right PERRL 3 2 Round Brisk None   Left PERRL 3 2 Round Brisk None         Visual Fields       Left Right    Full Full         Extraocular Movement       Right Left    Full, Ortho Full, Ortho         Neuro/Psych     Oriented x3: Yes   Mood/Affect: Normal         Dilation     Both eyes: 1.0% Mydriacyl, 2.5% Phenylephrine  @ 2:37 PM           Slit Lamp and Fundus Exam     Slit Lamp Exam       Right Left   Lids/Lashes Dermato Dermato, mild MGD   Conjunctiva/Sclera White and quiet White and quiet, mild nasal and temporal pinguecula   Cornea Arcus, trace PEE, tear film debris, well healed cataract wound Arcus, 1+PEE, trace tear film debris, well healed cataract would   Anterior Chamber Deep and quiet Deep and quiet   Iris Round and dilated, no NVI, TID @ 0300 limbus--patent LPI Round and dilated, no NVI, TID @ 0900 limbus--patent LPI   Lens PCIOL in good position, 1+ non central PCO PCIOL in good position   Anterior  Vitreous Synerisis Synerisis         Fundus Exam       Right Left   Disc Mild pallor, sharp rim Pink, sharp, +fine vascular loops, +hyperemia   C/D Ratio 0.4 0.6   Macula Flat, good foveal reflex, RPE mottling, no heme or edema Blunted foveal reflex, scattered IRH/DBH greatest temporal to fovea - improved, +edema- improved, mild ERM w/central lamellar hole   Vessels Attenuated, Tortuous, AV crossing changes Attenuated, tortuous, +AV crossing changs, dilated venules--CRVO   Periphery Attached, no heme, mild reticular degeneration Attached, mild 360 MA/DBH- improved           Refraction     Wearing Rx       Sphere Cylinder Axis Add   Right -0.50 +2.00 018 +2.25   Left -1.50 +1.50 167 +2.25    Type: Progressive           IMAGING AND PROCEDURES  Imaging and Procedures for 11/11/2023  OCT, Retina - OU - Both Eyes       Right Eye Quality was good. Central Foveal Thickness: 237. Progression has been stable. Findings include normal foveal contour, no IRF, no SRF (Partial PVD).   Left Eye Quality was good. Central Foveal Thickness: 254. Progression has improved. Findings include no SRF, abnormal foveal contour, intraretinal hyper-reflective material, epiretinal membrane, intraretinal fluid, lamellar hole, outer retinal atrophy (ERM w/lamellar hole, central atrophy, mild improvement in temporal cystic changes/edema).   Notes *Images captured and stored on drive  Diagnosis / Impression:  OD: NFP, No IRF/SRF, Partial PVD OS: ERM w/lamellar hole, central atrophy, mild improvement in temporal cystic changes/edema  Clinical management:  See below  Abbreviations: NFP - Normal foveal profile. CME - cystoid macular edema. PED -  pigment epithelial detachment. IRF - intraretinal fluid. SRF - subretinal fluid. EZ - ellipsoid zone. ERM - epiretinal membrane. ORA - outer retinal atrophy. ORT - outer retinal tubulation. SRHM - subretinal hyper-reflective material. IRHM - intraretinal  hyper-reflective material      Intravitreal Injection, Pharmacologic Agent - OS - Left Eye       Time Out 11/11/2023. 3:47 PM. Confirmed correct patient, procedure, site, and patient consented.   Anesthesia Topical anesthesia was used. Anesthetic medications included Lidocaine  2%, Proparacaine 0.5%.   Procedure Preparation included 5% betadine to ocular surface, eyelid speculum. A supplied needle was used.   Injection: 1.25 mg Bevacizumab  1.25mg /0.38ml   Route: Intravitreal, Site: Left Eye   NDC: [id number], Lot: [id number], Expiration date: 11/23/2023   Post-op Post injection exam found visual acuity of at least counting fingers. The patient tolerated the procedure well. There were no complications. The patient received written and verbal post procedure care education. Post injection medications were not given.            ASSESSMENT/PLAN:    ICD-10-CM   1. Central retinal vein occlusion with macular edema of left eye (HCC)  H34.8120 OCT, Retina - OU - Both Eyes    Intravitreal Injection, Pharmacologic Agent - OS - Left Eye    Bevacizumab  (AVASTIN ) SOLN 1.25 mg    2. Essential hypertension  I10     3. Hypertensive retinopathy of both eyes  H35.033     4. Epiretinal membrane (ERM) of left eye  H35.372     5. Lamellar macular hole of left eye  H35.342     6. Diabetes mellitus type 2 without retinopathy (HCC)  E11.9     7. Pseudophakia of both eyes  Z96.1      1. Remote CRVO w/ CME, OS - initially, pt reported progressive decline in vision OS, first noted ~6-7 mos prior to presentation  - waited for regularly scheduled f/u with Dr. Mevelyn for evaluation - pt w/ significant cardiovascular history -- MI in 2008, s/p CABG Oct 2021; pt reports spikes in BP ~3 mos ago (180s/110s), pt has had 5 bypasses in total.  - FA 10.12.22 shows remote RVO w/ CME. Cluster of perivascular, perifoveal leakage temporal macula--?Partial petaloid pattern - s/p IVA OS #1 (10.12.22), #2  (11.09.22), #3 (12.06.22), IVA OS #4 (01.04.23) <<==>> #5 (08.12.25), #6 (09.09.25) - Pt lost to f/u from Jan 2023 to Aug 2025 - attributed to other health issues.  - BCVA OS 20/60 from 20/50 - OCT OS shows ERM w/lamellar hole, central atrophy, mild improvement in temporal cystic changes/edema at 4 weeks  - recommend IVA OS #7 today, 10.07.25 w/ f/u in 4 wks - RBA of procedure discussed, questions answered - IVA informed consent obtained and signed 08.12.25 - see procedure note  - f/u 4 wks -- DFE/OCT, possible injection  2,3. Hypertensive retinopathy OU - discussed importance of tight BP control and relation to RVO - monitor   4,5. Epiretinal membrane w/ lamellar hole OS - mild ERM w/ lamellar hole - BCVA 20/50+ -- limited mostly by remote CRVO w/ central atrophy - no indication for surgery at this time - monitor for now  6. Diabetes mellitus, type 2 without retinopathy  - A1c 6.5 (5.24.25), 6.2 (7.27.22)  - OS with scattered IRH/DBH -- mostly from CRVO  - OD w/o heme  - no DME OU - monitor OU  7. Pseudophakia OU  - s/p CE/IOL OU  - IOL in good position, doing well  -  monitor   Ophthalmic Meds Ordered this visit:  Meds ordered this encounter  Medications   Bevacizumab  (AVASTIN ) SOLN 1.25 mg     Return in about 4 weeks (around 12/09/2023) for CRVO OS, DFE, OCT, Possible Injxn.  There are no Patient Instructions on file for this visit.  Explained the diagnoses, plan, and follow up with the patient and they expressed understanding.  Patient expressed understanding of the importance of proper follow up care.   This document serves as a record of services personally performed by Redell JUDITHANN Hans, MD, PhD. It was created on their behalf by Wanda GEANNIE Keens, COT an ophthalmic technician. The creation of this record is the provider's dictation and/or activities during the visit.    Electronically signed by:  Wanda GEANNIE Keens, COT  11/11/23 8:30 PM  This document serves as  a record of services personally performed by Redell JUDITHANN Hans, MD, PhD. It was created on their behalf by Almetta Pesa, an ophthalmic technician. The creation of this record is the provider's dictation and/or activities during the visit.    Electronically signed by: Almetta Pesa, OA, 11/11/23  8:30 PM  Redell JUDITHANN Hans, M.D., Ph.D. Diseases & Surgery of the Retina and Vitreous [facility]  I have reviewed the above documentation for accuracy and completeness, and I agree with the above. Redell JUDITHANN Hans, M.D., Ph.D. 11/11/23 8:32 PM  Abbreviations: M myopia (nearsighted); A astigmatism; H hyperopia (farsighted); P presbyopia; Mrx spectacle prescription;  CTL contact lenses; OD right eye; OS left eye; OU both eyes  XT exotropia; ET esotropia; PEK punctate epithelial keratitis; PEE punctate epithelial erosions; DES dry eye syndrome; MGD meibomian gland dysfunction; ATs artificial tears; PFAT's preservative free artificial tears; NSC nuclear sclerotic cataract; PSC posterior subcapsular cataract; ERM epi-retinal membrane; PVD posterior vitreous detachment; RD retinal detachment; DM diabetes mellitus; DR diabetic retinopathy; NPDR non-proliferative diabetic retinopathy; PDR proliferative diabetic retinopathy; CSME clinically significant macular edema; DME diabetic macular edema; dbh dot blot hemorrhages; CWS cotton wool spot; POAG primary open angle glaucoma; C/D cup-to-disc ratio; HVF humphrey visual field; GVF goldmann visual field; OCT optical coherence tomography; IOP intraocular pressure; BRVO Branch retinal vein occlusion; CRVO central retinal vein occlusion; CRAO central retinal artery occlusion; BRAO branch retinal artery occlusion; RT retinal tear; SB scleral buckle; PPV pars plana vitrectomy; VH Vitreous hemorrhage; PRP panretinal laser photocoagulation; IVK intravitreal kenalog; VMT vitreomacular traction; MH Macular hole;  NVD neovascularization of the disc; NVE  neovascularization elsewhere; AREDS age related eye disease study; ARMD age related macular degeneration; POAG primary open angle glaucoma; EBMD epithelial/anterior basement membrane dystrophy; ACIOL anterior chamber intraocular lens; IOL intraocular lens; PCIOL posterior chamber intraocular lens; Phaco/IOL phacoemulsification with intraocular lens placement; PRK photorefractive keratectomy; LASIK laser assisted in situ keratomileusis; HTN hypertension; DM diabetes mellitus; COPD chronic obstructive pulmonary disease

## 2023-11-11 ENCOUNTER — Ambulatory Visit (INDEPENDENT_AMBULATORY_CARE_PROVIDER_SITE_OTHER): Admitting: Ophthalmology

## 2023-11-11 ENCOUNTER — Encounter (INDEPENDENT_AMBULATORY_CARE_PROVIDER_SITE_OTHER): Payer: Self-pay | Admitting: Ophthalmology

## 2023-11-11 DIAGNOSIS — H35372 Puckering of macula, left eye: Secondary | ICD-10-CM

## 2023-11-11 DIAGNOSIS — H35342 Macular cyst, hole, or pseudohole, left eye: Secondary | ICD-10-CM

## 2023-11-11 DIAGNOSIS — Z961 Presence of intraocular lens: Secondary | ICD-10-CM | POA: Diagnosis not present

## 2023-11-11 DIAGNOSIS — H35033 Hypertensive retinopathy, bilateral: Secondary | ICD-10-CM

## 2023-11-11 DIAGNOSIS — I10 Essential (primary) hypertension: Secondary | ICD-10-CM

## 2023-11-11 DIAGNOSIS — E119 Type 2 diabetes mellitus without complications: Secondary | ICD-10-CM | POA: Diagnosis not present

## 2023-11-11 DIAGNOSIS — H34812 Central retinal vein occlusion, left eye, with macular edema: Secondary | ICD-10-CM | POA: Diagnosis not present

## 2023-11-11 MED ORDER — BEVACIZUMAB CHEMO INJECTION 1.25MG/0.05ML SYRINGE FOR KALEIDOSCOPE
1.2500 mg | INTRAVITREAL | Status: AC | PRN
  Administered 2023-11-11: 1.25 mg via INTRAVITREAL

## 2023-11-17 DIAGNOSIS — N5201 Erectile dysfunction due to arterial insufficiency: Secondary | ICD-10-CM | POA: Diagnosis not present

## 2023-11-17 DIAGNOSIS — N200 Calculus of kidney: Secondary | ICD-10-CM | POA: Diagnosis not present

## 2023-11-17 DIAGNOSIS — R399 Unspecified symptoms and signs involving the genitourinary system: Secondary | ICD-10-CM | POA: Diagnosis not present

## 2023-11-24 ENCOUNTER — Other Ambulatory Visit: Payer: Self-pay | Admitting: Urology

## 2023-11-25 ENCOUNTER — Telehealth: Payer: Self-pay

## 2023-11-25 NOTE — Telephone Encounter (Signed)
 PAP: Patient assistance application for Jardiance through [employer]) has been mailed to pt's home address on file. Provider portion of application will be faxed to provider's office.

## 2023-11-25 NOTE — Progress Notes (Signed)
 [facility] - Clinic Note  12/09/2023     CHIEF COMPLAINT Patient presents for Retina Follow Up    HISTORY OF PRESENT ILLNESS: Kevin Perry is a 73 y.o. male who presents to the clinic today for:   HPI     Retina Follow Up   Patient presents with  CRVO/BRVO.  In left eye.  This started 4 weeks ago.  Duration of 4 weeks.  Since onset it is stable.  I, the attending physician,  performed the HPI with the patient and updated documentation appropriately.        Comments   4 week retina follow up CRVO OS pt is reporting vision changes noticed he has some floaters denies flashes last reading 130-140 range A1C 6.8      Last edited by Valdemar Rogue, MD on 12/09/2023  8:31 PM.     Patient states that there is a hole in his vision.   Referring physician: [name], DO [address] [place],  [place] [address]  HISTORICAL INFORMATION:  Selected notes from the MEDICAL RECORD NUMBER Referred by Dr. CHARM [name] for decreased vision and macular edema OS   CURRENT MEDICATIONS: No current outpatient medications on file. (Ophthalmic Drugs)   No current facility-administered medications for this visit. (Ophthalmic Drugs)   Current Outpatient Medications (Other)  Medication Sig   amLODipine  (NORVASC ) 10 MG tablet Take 1 tablet (10 mg total) by mouth daily.   aspirin  EC 81 MG tablet Take 81 mg by mouth daily. Swallow whole.   empagliflozin (JARDIANCE) 25 MG TABS tablet Take 25 mg by mouth daily.   escitalopram  (LEXAPRO ) 20 MG tablet Take 1 tablet (20 mg total) by mouth daily.   folic acid  (FOLVITE ) 1 MG tablet Take 1 mg by mouth See admin instructions. Take 1 mg daily except skip dose on Wednesdays (methotrexate day)   furosemide  (LASIX ) 40 MG tablet TAKE 1 TABLET BY MOUTH ONCE DAILY   gabapentin  (NEURONTIN ) 100 MG capsule Take 1 capsule (100 mg total) by mouth 2 (two) times daily.   lisinopril  (ZESTRIL ) 20 MG tablet Take 1 tablet by mouth 2 (two)  times daily.   loratadine  (CLARITIN ) 10 MG tablet Take 10 mg by mouth every morning.   metFORMIN  (GLUCOPHAGE ) 500 MG tablet TAKE 2 TABLETS BY MOUTH ONCE EVERY MORNING WITH BREAKFAST AND 1 TABLET ONCEEVERY EVENING WITH DINNER   methotrexate (RHEUMATREX) 2.5 MG tablet Take 10 mg by mouth once a week. On Wednesdays   metoprolol  tartrate (LOPRESSOR ) 25 MG tablet Take 1 tablet (25 mg total) by mouth 2 (two) times daily.   nitroGLYCERIN  (NITROSTAT ) 0.4 MG SL tablet Place 0.4 mg under the tongue every 5 (five) minutes as needed for chest pain.   REPATHA  SURECLICK 140 MG/ML SOAJ Inject 140 mg as directed every 14 (fourteen) days.   TRULICITY  3 MG/0.5ML SOPN INJECT 3 MG (0.5 ML) UNDER THE SKIN ONCE A WEEK   HYDROcodone -acetaminophen  (NORCO/VICODIN) 5-325 MG tablet Take 1 tablet by mouth every 6 (six) hours as needed.   No current facility-administered medications for this visit. (Other)   REVIEW OF SYSTEMS: ROS   Positive for: Neurological, Musculoskeletal, Endocrine, Cardiovascular, Eyes Negative for: Constitutional, Gastrointestinal, Skin, Genitourinary, HENT, Respiratory, Psychiatric, Allergic/Imm, Heme/Lymph Last edited by Resa Delon ORN, COT on 12/09/2023  1:55 PM.     ALLERGIES Allergies  Allergen Reactions   Naproxen Swelling and Anaphylaxis   Rosuvastatin  Other (See Comments)   PAST MEDICAL HISTORY Past Medical History:  Diagnosis Date   Anxiety    Arthritis    Cancer (HCC)    skin: head   Coronary artery disease    Diabetes mellitus without complication (HCC)    Dyspnea    History of kidney stones    Hypertension    Hypertensive retinopathy    Myocardial infarction [facility])    Pneumonia    Postoperative nausea 11/27/2019   Past Surgical History:  Procedure Laterality Date   BIOPSY  08/27/2022   Procedure: BIOPSY;  Surgeon: Maryruth Ole DASEN, MD;  Location: [facility];  Service: Endoscopy;;   CATARACT EXTRACTION     CHOLECYSTECTOMY     COLONOSCOPY WITH PROPOFOL   N/A 08/27/2022   Procedure: COLONOSCOPY WITH PROPOFOL ;  Surgeon: Maryruth Ole DASEN, MD;  Location: [facility];  Service: Endoscopy;  Laterality: N/A;   CORONARY ANGIOPLASTY WITH STENT PLACEMENT N/A 02/04/2006   ESOPHAGOGASTRODUODENOSCOPY (EGD) WITH PROPOFOL  N/A 08/27/2022   Procedure: ESOPHAGOGASTRODUODENOSCOPY (EGD) WITH PROPOFOL ;  Surgeon: Maryruth Ole DASEN, MD;  Location: [facility];  Service: Endoscopy;  Laterality: N/A;   EYE SURGERY     FOOT SURGERY Left    DOS 8.1.14 HALLIX IPJ FUSION, SECOND MET OSTEOTOMY W/SCREW, HAM TOE REPAIR 2,,4 , PARTIAL AMP 3RD DIGIT    GALLBLADDER SURGERY     HEMOSTASIS CLIP PLACEMENT  08/27/2022   Procedure: HEMOSTASIS CLIP PLACEMENT;  Surgeon: Maryruth Ole DASEN, MD;  Location: [facility];  Service: Endoscopy;;   IR URETERAL STENT RIGHT NEW ACCESS W/O SEP NEPHROSTOMY CATH  09/01/2023   KNEE SURERY Right    LEFT HEART CATH AND CORONARY ANGIOGRAPHY Left 11/17/2019   Procedure: LEFT HEART CATH AND CORONARY ANGIOGRAPHY;  Surgeon: Bosie Vinie LABOR, MD;  Location: [facility];  Service: Cardiovascular;  Laterality: Left;   MYRINGOTOMY WITH TUBE PLACEMENT Right 10/25/2021   Procedure: MYRINGOTOMY WITH BUTTERFLY TUBE PLACEMENT;  Surgeon: Edda Mt, MD;  Location: [facility];  Service: ENT;  Laterality: Right;   NEPHROLITHOTOMY Right 09/01/2023   Procedure: NEPHROLITHOTOMY PERCUTANEOUS;  Surgeon: Carolee Sherwood JONETTA Duane, MD;  Location: [facility];  Service: Urology;  Laterality: Right;   POLYPECTOMY  08/27/2022   Procedure: POLYPECTOMY;  Surgeon: Maryruth Ole DASEN, MD;  Location: [facility];  Service: Endoscopy;;   SUBMUCOSAL LIFTING INJECTION  08/27/2022   Procedure: SUBMUCOSAL LIFTING INJECTION;  Surgeon: Maryruth Ole DASEN, MD;  Location: [facility];  Service: Endoscopy;;  ELEVIEW ASCENDING COLON POLYP   TOTAL KNEE ARTHROPLASTY Right 08/30/2014   Procedure: TOTAL KNEE ARTHROPLASTY;  Surgeon: Ozell Flake, MD;  Location: [facility];   Service: Orthopedics;  Laterality: Right;   FAMILY HISTORY Family History  Problem Relation Age of Onset   Heart attack Mother    Diabetes Father    Hypertension Father    Heart attack Sister     SOCIAL HISTORY Social History   Tobacco Use   Smoking status: Former    Current packs/day: 0.00    Types: Cigarettes    Quit date: 08/16/2004    Years since quitting: 19.3   Smokeless tobacco: Former    Quit date: 2006   Tobacco comments:    quit 20 plus years  Vaping Use   Vaping status: Never Used  Substance Use Topics   Alcohol use: Not Currently    Alcohol/week: 2.0 standard drinks of alcohol    Types: 2 Cans of beer per week    Comment: SOCIAL   Drug use: No       OPHTHALMIC EXAM:  Base Eye Exam  Visual Acuity (Snellen - Linear)       Right Left   Dist cc 20/20 -2 20/70 -3   Dist ph cc  NI    Correction: Glasses         Tonometry (Tonopen, 1:59 PM)       Right Left   Pressure 17 18         Pupils       Pupils Dark Light Shape React APD   Right PERRL 3 2 Round Brisk None   Left PERRL 3 2 Round Brisk None         Visual Fields       Left Right    Full Full         Extraocular Movement       Right Left    Full, Ortho Full, Ortho         Neuro/Psych     Oriented x3: Yes   Mood/Affect: Normal         Dilation     Both eyes: 2.5% Phenylephrine  @ 1:59 PM           Slit Lamp and Fundus Exam     Slit Lamp Exam       Right Left   Lids/Lashes Dermato Dermato, mild MGD   Conjunctiva/Sclera White and quiet White and quiet, mild nasal and temporal pinguecula   Cornea Arcus, trace PEE, tear film debris, well healed cataract wound Arcus, 1+PEE, trace tear film debris, well healed cataract would   Anterior Chamber Deep and quiet Deep and quiet   Iris Round and dilated, no NVI, TID @ 0300 limbus--patent LPI Round and dilated, no NVI, TID @ 0900 limbus--patent LPI   Lens PCIOL in good position, 1+ non central PCO PCIOL in good  position   Anterior Vitreous Synerisis Synerisis         Fundus Exam       Right Left   Disc Mild pallor, sharp rim Pink, sharp, +fine vascular loops, +hyperemia   C/D Ratio 0.4 0.6   Macula Flat, good foveal reflex, RPE mottling, no heme or edema Blunted foveal reflex, scattered IRH/DBH greatest temporal to fovea - improved, +edema- improved, mild ERM w/central FTMH   Vessels Attenuated, Tortuous, AV crossing changes Attenuated, tortuous, +AV crossing changs, dilated venules--CRVO   Periphery Attached, mild reticular degeneration, focal DBH nasal to disc Attached, mild 360 MA/DBH- improved           Refraction     Wearing Rx       Sphere Cylinder Axis Add   Right -0.50 +2.00 018 +2.25   Left -1.50 +1.50 167 +2.25    Type: Progressive           IMAGING AND PROCEDURES  Imaging and Procedures for 12/09/2023  OCT, Retina - OU - Both Eyes       Right Eye Quality was good. Central Foveal Thickness: 239. Progression has been stable. Findings include normal foveal contour, no IRF, no SRF (Partial PVD).   Left Eye Quality was good. Central Foveal Thickness: 255. Progression has worsened. Findings include no SRF, abnormal foveal contour, intraretinal hyper-reflective material, epiretinal membrane, intraretinal fluid, lamellar hole, outer retinal atrophy (ERM w/progression of lamellar hole to full thickness, central atrophy, persistent cystic changes/edema temporal mac).   Notes *Images captured and stored on drive  Diagnosis / Impression:  OD: NFP, No IRF/SRF, Partial PVD OS: ERM w/ progression of lamellar hole to full thickness macular hole, central atrophy, persistent cystic  changes/edema temporal macula  Clinical management:  See below  Abbreviations: NFP - Normal foveal profile. CME - cystoid macular edema. PED - pigment epithelial detachment. IRF - intraretinal fluid. SRF - subretinal fluid. EZ - ellipsoid zone. ERM - epiretinal membrane. ORA - outer retinal  atrophy. ORT - outer retinal tubulation. SRHM - subretinal hyper-reflective material. IRHM - intraretinal hyper-reflective material      Intravitreal Injection, Pharmacologic Agent - OS - Left Eye       Time Out 12/09/2023. 3:10 PM. Confirmed correct patient, procedure, site, and patient consented.   Anesthesia Topical anesthesia was used. Anesthetic medications included Lidocaine  2%, Proparacaine 0.5%.   Procedure Preparation included 5% betadine to ocular surface, eyelid speculum. A supplied needle was used.   Injection: 1.25 mg Bevacizumab  1.25mg /0.54ml   Route: Intravitreal, Site: Left Eye   NDC: [id number], Lot: [id number], Expiration date: 12/28/2023   Post-op Post injection exam found visual acuity of at least counting fingers. The patient tolerated the procedure well. There were no complications. The patient received written and verbal post procedure care education. Post injection medications were not given.             ASSESSMENT/PLAN:    ICD-10-CM   1. Central retinal vein occlusion with macular edema of left eye (HCC)  H34.8120 OCT, Retina - OU - Both Eyes    Intravitreal Injection, Pharmacologic Agent - OS - Left Eye    Bevacizumab  (AVASTIN ) SOLN 1.25 mg    2. Essential hypertension  I10     3. Hypertensive retinopathy of both eyes  H35.033     4. Epiretinal membrane (ERM) of left eye  H35.372     5. Lamellar macular hole of left eye  H35.342     6. Diabetes mellitus type 2 without retinopathy (HCC)  E11.9     7. Pseudophakia of both eyes  Z96.1      1. Remote CRVO w/ CME, OS - initially, pt reported progressive decline in vision OS, first noted ~6-7 mos prior to presentation - waited for regularly scheduled f/u with Dr. Mevelyn for evaluation - pt w/ significant cardiovascular history -- MI in 2008, s/p CABG Oct 2021; pt reports spikes in BP ~3 mos ago (180s/110s), pt has had 5 bypasses in total.  - FA 10.12.22 shows remote RVO w/ CME. Cluster of  perivascular, perifoveal leakage temporal macula--?Partial petaloid pattern - s/p IVA OS #1 (10.12.22), #2 (11.09.22), #3 (12.06.22), #4 (01.04.23) <<==>> #5 (08.12.25), #6 (09.09.25), #7 (10.07.25) - Pt lost to f/u from Jan 2023 to Aug 2025 - attributed to other health issues.  - BCVA OS 20/70 from 20/60  - OCT OS shows ERM w/progression of lamellar hole to full thickness mac hole, central atrophy, persistent cystic changes/edema temporal mac at 4 weeks  - recommend IVA OS #8 today, 11.04.25 w/ f/u in 5 wks - RBA of procedure discussed, questions answered - IVA informed consent obtained and signed 08.12.25 - see procedure note  - f/u 5 wks -- DFE/OCT, possible injection  2,3. Hypertensive retinopathy OU - discussed importance of tight BP control and relation to RVO - monitor   4,5. Epiretinal membrane w/ full thickness macular hole (progressed from lamellar hole) OS - mild ERM w/ FTMH as of 11.04.25 visit - BCVA OS 20/70 from 20/60 - The natural history, anatomy, potential for loss of vision, and treatment options including vitrectomy techniques and the complications of endophthalmitis, retinal detachment, vitreous hemorrhage, cataract progression and permanent vision loss discussed with the patient. -  pt wishes to defer surgical intervention at this time due to other medical issues - monitor for now  6. Diabetes mellitus, type 2 without retinopathy  - A1c 6.5 (5.24.25), 6.2 (7.27.22)  - OS with scattered IRH/DBH -- mostly from CRVO  - OD w/o heme  - no DME OU - monitor OU  7. Pseudophakia OU  - s/p CE/IOL OU  - IOL in good position, doing well  - monitor   Ophthalmic Meds Ordered this visit:  Meds ordered this encounter  Medications   Bevacizumab  (AVASTIN ) SOLN 1.25 mg     Return in about 5 weeks (around 01/13/2024) for f/u, CRVO, DFE, OCT, Possible, IVA, OS.  There are no Patient Instructions on file for this visit.  Explained the diagnoses, plan, and follow up with the  patient and they expressed understanding.  Patient expressed understanding of the importance of proper follow up care.   This document serves as a record of services personally performed by Redell JUDITHANN Hans, MD, PhD. It was created on their behalf by Wanda GEANNIE Keens, COT an ophthalmic technician. The creation of this record is the provider's dictation and/or activities during the visit.    Electronically signed by:  Wanda GEANNIE Keens, COT  12/09/23 8:33 PM  This document serves as a record of services personally performed by Redell JUDITHANN Hans, MD, PhD. It was created on their behalf by Almetta Pesa, an ophthalmic technician. The creation of this record is the provider's dictation and/or activities during the visit.    Electronically signed by: Almetta Pesa, OA, 12/09/23  8:33 PM  Redell JUDITHANN Hans, M.D., Ph.D. Diseases & Surgery of the Retina and Vitreous [facility]  I have reviewed the above documentation for accuracy and completeness, and I agree with the above. Redell JUDITHANN Hans, M.D., Ph.D. 12/09/23 8:37 PM   Abbreviations: M myopia (nearsighted); A astigmatism; H hyperopia (farsighted); P presbyopia; Mrx spectacle prescription;  CTL contact lenses; OD right eye; OS left eye; OU both eyes  XT exotropia; ET esotropia; PEK punctate epithelial keratitis; PEE punctate epithelial erosions; DES dry eye syndrome; MGD meibomian gland dysfunction; ATs artificial tears; PFAT's preservative free artificial tears; NSC nuclear sclerotic cataract; PSC posterior subcapsular cataract; ERM epi-retinal membrane; PVD posterior vitreous detachment; RD retinal detachment; DM diabetes mellitus; DR diabetic retinopathy; NPDR non-proliferative diabetic retinopathy; PDR proliferative diabetic retinopathy; CSME clinically significant macular edema; DME diabetic macular edema; dbh dot blot hemorrhages; CWS cotton wool spot; POAG primary open angle glaucoma; C/D cup-to-disc ratio; HVF  humphrey visual field; GVF goldmann visual field; OCT optical coherence tomography; IOP intraocular pressure; BRVO Branch retinal vein occlusion; CRVO central retinal vein occlusion; CRAO central retinal artery occlusion; BRAO branch retinal artery occlusion; RT retinal tear; SB scleral buckle; PPV pars plana vitrectomy; VH Vitreous hemorrhage; PRP panretinal laser photocoagulation; IVK intravitreal kenalog; VMT vitreomacular traction; MH Macular hole;  NVD neovascularization of the disc; NVE neovascularization elsewhere; AREDS age related eye disease study; ARMD age related macular degeneration; POAG primary open angle glaucoma; EBMD epithelial/anterior basement membrane dystrophy; ACIOL anterior chamber intraocular lens; IOL intraocular lens; PCIOL posterior chamber intraocular lens; Phaco/IOL phacoemulsification with intraocular lens placement; PRK photorefractive keratectomy; LASIK laser assisted in situ keratomileusis; HTN hypertension; DM diabetes mellitus; COPD chronic obstructive pulmonary disease

## 2023-12-01 ENCOUNTER — Telehealth: Payer: Self-pay | Admitting: Pharmacist

## 2023-12-01 ENCOUNTER — Other Ambulatory Visit

## 2023-12-01 NOTE — Progress Notes (Signed)
   Outreach Note  12/01/2023 Name: Kevin Perry MRN: [id number] DOB: 02-Sep-1950  Referred by: [name], DO  Was unable to reach patient via telephone today and have left HIPAA compliant voicemail asking patient to return my call.   Follow Up Plan: Will collaborate with Care Guide to outreach to schedule follow up with me  Sharyle Sia, PharmD, JAQUELINE, CPP Clinical Pharmacist [facility] [phone]

## 2023-12-02 DIAGNOSIS — I5033 Acute on chronic diastolic (congestive) heart failure: Secondary | ICD-10-CM | POA: Diagnosis not present

## 2023-12-02 DIAGNOSIS — I2510 Atherosclerotic heart disease of native coronary artery without angina pectoris: Secondary | ICD-10-CM | POA: Diagnosis not present

## 2023-12-02 DIAGNOSIS — I10 Essential (primary) hypertension: Secondary | ICD-10-CM | POA: Diagnosis not present

## 2023-12-02 DIAGNOSIS — Z951 Presence of aortocoronary bypass graft: Secondary | ICD-10-CM | POA: Diagnosis not present

## 2023-12-02 DIAGNOSIS — E782 Mixed hyperlipidemia: Secondary | ICD-10-CM | POA: Diagnosis not present

## 2023-12-02 DIAGNOSIS — E118 Type 2 diabetes mellitus with unspecified complications: Secondary | ICD-10-CM | POA: Diagnosis not present

## 2023-12-02 DIAGNOSIS — I340 Nonrheumatic mitral (valve) insufficiency: Secondary | ICD-10-CM | POA: Diagnosis not present

## 2023-12-08 ENCOUNTER — Other Ambulatory Visit: Admitting: Pharmacist

## 2023-12-08 ENCOUNTER — Other Ambulatory Visit (HOSPITAL_COMMUNITY): Payer: Self-pay

## 2023-12-08 ENCOUNTER — Telehealth: Payer: Self-pay

## 2023-12-08 DIAGNOSIS — E1169 Type 2 diabetes mellitus with other specified complication: Secondary | ICD-10-CM

## 2023-12-08 DIAGNOSIS — Z7985 Long-term (current) use of injectable non-insulin antidiabetic drugs: Secondary | ICD-10-CM

## 2023-12-08 DIAGNOSIS — I10 Essential (primary) hypertension: Secondary | ICD-10-CM

## 2023-12-08 DIAGNOSIS — I2510 Atherosclerotic heart disease of native coronary artery without angina pectoris: Secondary | ICD-10-CM

## 2023-12-08 NOTE — Telephone Encounter (Signed)
 PAP: Application for Bernadine has been submitted to [employer]), via fax

## 2023-12-08 NOTE — Progress Notes (Signed)
 12/08/2023 Name: Kevin Perry MRN: [id number] DOB: [birth year]  Chief Complaint  Patient presents with   Medication Management   Medication Assistance    OCTAVIANO Perry is a 73 y.o. year old male who presented for a telephone visit.   They were referred to the pharmacist by their PCP for assistance in managing diabetes, hypertension, hyperlipidemia, and medication access.      Subjective:   Care Team: Primary Care Provider: [name], DO; Next Scheduled Visit: 01/21/2024 Cardiologist: Dewane Shiner, DO; Next Scheduled Visit: 06/02/2024 Note echocardiogram scheduled for 12/15/2023 Gastroenterology: Maryruth Ole Revel, MD  Medication Access/Adherence  Current Pharmacy:  [facility] [place], [place] - [address] [place] [address] Phone: [phone] Fax: [phone]   Patient reports affordability concerns with their medications: No Patient reports access/transportation concerns to their pharmacy: No  Patient reports adherence concerns with their medications:  No         Diabetes:   Current medications:  Metformin  IR 500mg  x 2 in AM / x 1 in PM Trulicity  3 mg weekly Jardiance 25 mg daily   Medications tried in the past: glipizide      Morning fasting glucose recently ranging 130-160 *Attributes recent higher fasting blood sugar readings to eating more fast food recently, but plans to get back to positive eating habits starting today    Patient denies hypoglycemic s/sx including dizziness, shakiness, sweating.     Current medication access support:  - Enrolled for Trulicity  from Lily and Jardiance from [place] Cares Patient Assistance Program through 02/04/2024 Note patient collaborating with CPhT Suzen Mall for re-enrollment in [facility] Cares patient assistance for 2026 calendar year. Today reports that he received this application in mail, completed and mailed back to CPhT on Saturday   Hypertension:   Current  medications:  Amlodipine  10mg  daily Furosemide  40 mg once daily Lisinopril  20 mg twice daily Metoprolol  tartrate 25 mg twice daily   Patient has an automated, upper arm home BP cuff   Recalls home blood pressure reading yesterday: 122/80 (does not recall HR with reading)        Hyperlipidemia/ASCVD Risk Reduction   Current lipid lowering medications: Repatha  Sureclick 140 mg every 14 days                                                  Note Repatha  PA approved through 01/2024    Medications tried in the past: atorvastatin  (myopathy)   Antiplatelet regimen: aspirin  81 mg daily   ASCVD History: CAD with prior CABG and PCI    Current Medication Access Support: [employer] Hypercholesteremia- Medicare Access grant - patient approved through 01/24/2024   Objective:  Lab Results  Component Value Date   HGBA1C 6.7 (A) 10/13/2023    Lab Results  Component Value Date   CREATININE 0.94 09/01/2023   BUN 17 09/01/2023   NA 137 09/01/2023   K 3.6 09/01/2023   CL 102 09/01/2023   CO2 24 09/01/2023    Lab Results  Component Value Date   CHOL 172 01/17/2023   HDL 29 (L) 01/17/2023   LDLCALC  01/17/2023     Comment:     . LDL cholesterol not calculated. Triglyceride levels greater than 400 mg/dL invalidate calculated LDL results. . Reference range: <100 . Desirable range <100 mg/dL for primary  prevention;   <70 mg/dL for patients with CHD or diabetic patients  with > or = 2 CHD risk factors. SABRA LDL-C is now calculated using the Martin-Hopkins  calculation, which is a validated novel method providing  better accuracy than the Friedewald equation in the  estimation of LDL-C.  [name] et al. SANDREA. 7986;689(80): 2061-2068  (http://education.Quest[employer].com/faq/FAQ164)    TRIG 580 (H) 01/17/2023   CHOLHDL 5.9 (H) 01/17/2023   BP Readings from Last 3 Encounters:  10/13/23 124/70  09/02/23 (!) 142/72  09/01/23 (!) 148/76   Pulse Readings from Last 3  Encounters:  10/13/23 (!) 55  09/02/23 (!) 56  09/01/23 (!) 50     Medications Reviewed Today     Reviewed by Alana Sharyle LABOR, RPH-CPP (Pharmacist) on 12/08/23 at 1006  Med List Status: <None>   Medication Order Taking? Sig Documenting Provider Last Dose Status Informant  amLODipine  (NORVASC ) 10 MG tablet [id number] Yes Take 1 tablet (10 mg total) by mouth daily. [name], DO  Active Self  aspirin  EC 81 MG tablet [id number] Yes Take 81 mg by mouth daily. Swallow whole. [provider]  Active Self  empagliflozin (JARDIANCE) 25 MG TABS tablet [id number] Yes Take 25 mg by mouth daily. [provider]  Active Self  escitalopram  (LEXAPRO ) 20 MG tablet [id number]  Take 1 tablet (20 mg total) by mouth daily. [name], DO  Active Self  folic acid  (FOLVITE ) 1 MG tablet [id number]  Take 1 mg by mouth See admin instructions. Take 1 mg daily except skip dose on Wednesdays (methotrexate day) [provider]  Active Self  furosemide  (LASIX ) 40 MG tablet [id number] Yes TAKE 1 TABLET BY MOUTH ONCE DAILY Edman, Marsa PARAS, DO  Active Self  gabapentin  (NEURONTIN ) 100 MG capsule [id number]  Take 1 capsule (100 mg total) by mouth 2 (two) times daily. [name], DO  Active Self  HYDROcodone -acetaminophen  (NORCO/VICODIN) 5-325 MG tablet [id number]  Take 1 tablet by mouth every 6 (six) hours as needed. Carolee Sherwood JONETTA Pike, MD  Active   lisinopril  (ZESTRIL ) 20 MG tablet [id number] Yes Take 1 tablet by mouth 2 (two) times daily. [provider]  Active Self  loratadine  (CLARITIN ) 10 MG tablet [id number]  Take 10 mg by mouth every morning. [provider]  Active Self  metFORMIN  (GLUCOPHAGE ) 500 MG tablet [id number] Yes TAKE 2 TABLETS BY MOUTH ONCE EVERY MORNING WITH BREAKFAST AND 1 TABLET ONCEEVERY EVENING WITH DINNER Karamalegos, Marsa PARAS, DO  Active Self  methotrexate (RHEUMATREX) 2.5 MG tablet [id number]  Take 10 mg by  mouth once a week. On Wednesdays [provider]  Active Self  metoprolol  tartrate (LOPRESSOR ) 25 MG tablet [id number] Yes Take 1 tablet (25 mg total) by mouth 2 (two) times daily. [name], DO  Active Self  nitroGLYCERIN  (NITROSTAT ) 0.4 MG SL tablet [id number]  Place 0.4 mg under the tongue every 5 (five) minutes as needed for chest pain. [provider]  Active Self           Med Note CHRISTIE ALYSON Kitchens Aug 25, 2023 11:36 AM)    REPATHA  SURECLICK 140 MG/ML SOAJ [id number] Yes Inject 140 mg as directed every 14 (fourteen) days. [name], DO  Active Self           Med Note CHRISTIE ALYSON Kitchens Aug 25, 2023 11:35 AM) Monday  TRULICITY  3 MG/0.5ML SOPN [id number] Yes INJECT 3 MG (0.5 ML) UNDER THE SKIN ONCE  A WEEK Karamalegos, Marsa PARAS, DO  Active Self           Med Note CHRISTIE ALYSON Kitchens Aug 25, 2023 11:35 AM) Monday              Assessment/Plan:   Encourage patient to reduce fast food consumption, rather return to positive well-balanced meal eating habits  Diabetes: - Counseled on importance of having well-balanced meals, while controlling carbohydrate portion sizes - Recommend to continue to check glucose and keep a log of the results - Patient to contact [employer] patient assistance program as needed for refills of Trulicity  and [place] Cares as needed for refills of Jardiance - Collaborating with CPhT and PCP to assist patient with re-enrollment in Trulicity  from [place] [employer] Jardiance from [place] Cares Patient Assistance Program  Send message to CPhT Suzen Mall   Hypertension: - Have reviewed appropriate blood pressure monitoring technique and reviewed goal blood pressure.  - Recommend patient to continue to monitor home BP, keep log of results including HR readings and bring this record to medical appointments      Hyperlipidemia/ASCVD Risk Reduction: - Patient to follow up with [facility] as needed for refills of  Repatha  Note have provided grant billing information to patient's pharmacy Patient aware to provide to pharmacy and advise pharmacy to bill remaining balance of cost for Repatha  prescription to [employer] grant ONLY AFTER billed to Medicare      Follow Up Plan: Clinical Pharmacist will follow up with patient by telephone on 01/05/2024 at 11:30 AM to complete [employer] grant re-enrollment   Sharyle Sia, PharmD, JAQUELINE, CPP Clinical Pharmacist [facility] [phone]

## 2023-12-08 NOTE — Telephone Encounter (Signed)
 Received Patient portion PAP application ([place]) jardiance.

## 2023-12-08 NOTE — Telephone Encounter (Signed)
 PAP: Patient assistance application for Trulicity through [employer] has been mailed to pt's home address on file. Provider portion of application will be faxed to provider's office.

## 2023-12-08 NOTE — Patient Instructions (Signed)
 Goals Addressed             This Visit's Progress    Pharmacy Goals       Our goal A1c is less than 7%. This corresponds with fasting sugars less than 130 and 2 hour after meal sugars less than 180. Please check your blood sugar and keep log of the results   Check your blood pressure twice weekly, and any time you have concerning symptoms like headache, chest pain, dizziness, shortness of breath, or vision changes.   Our goal is less than 130/80.  To appropriately check your blood pressure, make sure you do the following:  1) Avoid caffeine , exercise, or tobacco products for 30 minutes before checking. Empty your bladder. 2) Sit with your back supported in a flat-backed chair. Rest your arm on something flat (arm of the chair, table, etc). 3) Sit still with your feet flat on the floor, resting, for at least 5 minutes.  4) Check your blood pressure. Take 1-2 readings.  5) Write down these readings and bring with you to any provider appointments.  Bring your home blood pressure machine with you to a provider's office for accuracy comparison at least once a year.   Make sure you take your blood pressure medications before you come to any office visit, even if you were asked to fast for labs.   Feel free to call me with any questions or concerns. I look forward to our next call!  Sharyle Sia, PharmD, [facility] Clinical Pharmacist [facility] [phone]

## 2023-12-09 ENCOUNTER — Ambulatory Visit (INDEPENDENT_AMBULATORY_CARE_PROVIDER_SITE_OTHER): Admitting: Ophthalmology

## 2023-12-09 ENCOUNTER — Encounter (INDEPENDENT_AMBULATORY_CARE_PROVIDER_SITE_OTHER): Payer: Self-pay | Admitting: Ophthalmology

## 2023-12-09 DIAGNOSIS — E119 Type 2 diabetes mellitus without complications: Secondary | ICD-10-CM | POA: Diagnosis not present

## 2023-12-09 DIAGNOSIS — H35033 Hypertensive retinopathy, bilateral: Secondary | ICD-10-CM

## 2023-12-09 DIAGNOSIS — Z961 Presence of intraocular lens: Secondary | ICD-10-CM

## 2023-12-09 DIAGNOSIS — I10 Essential (primary) hypertension: Secondary | ICD-10-CM | POA: Diagnosis not present

## 2023-12-09 DIAGNOSIS — H34812 Central retinal vein occlusion, left eye, with macular edema: Secondary | ICD-10-CM | POA: Diagnosis not present

## 2023-12-09 DIAGNOSIS — H35372 Puckering of macula, left eye: Secondary | ICD-10-CM | POA: Diagnosis not present

## 2023-12-09 DIAGNOSIS — H35342 Macular cyst, hole, or pseudohole, left eye: Secondary | ICD-10-CM

## 2023-12-09 MED ORDER — BEVACIZUMAB CHEMO INJECTION 1.25MG/0.05ML SYRINGE FOR KALEIDOSCOPE
1.2500 mg | INTRAVITREAL | Status: AC | PRN
  Administered 2023-12-09: 1.25 mg via INTRAVITREAL

## 2023-12-10 NOTE — Telephone Encounter (Signed)
 Received provider portion Pap application ([place]) Trulicity 

## 2023-12-15 DIAGNOSIS — I2510 Atherosclerotic heart disease of native coronary artery without angina pectoris: Secondary | ICD-10-CM | POA: Diagnosis not present

## 2023-12-15 DIAGNOSIS — I340 Nonrheumatic mitral (valve) insufficiency: Secondary | ICD-10-CM | POA: Diagnosis not present

## 2023-12-15 DIAGNOSIS — I5033 Acute on chronic diastolic (congestive) heart failure: Secondary | ICD-10-CM | POA: Diagnosis not present

## 2023-12-15 DIAGNOSIS — Z951 Presence of aortocoronary bypass graft: Secondary | ICD-10-CM | POA: Diagnosis not present

## 2023-12-17 DIAGNOSIS — B351 Tinea unguium: Secondary | ICD-10-CM | POA: Diagnosis not present

## 2023-12-17 DIAGNOSIS — L84 Corns and callosities: Secondary | ICD-10-CM | POA: Diagnosis not present

## 2023-12-17 DIAGNOSIS — S90222A Contusion of left lesser toe(s) with damage to nail, initial encounter: Secondary | ICD-10-CM | POA: Diagnosis not present

## 2023-12-17 DIAGNOSIS — I739 Peripheral vascular disease, unspecified: Secondary | ICD-10-CM | POA: Diagnosis not present

## 2023-12-17 DIAGNOSIS — E1151 Type 2 diabetes mellitus with diabetic peripheral angiopathy without gangrene: Secondary | ICD-10-CM | POA: Diagnosis not present

## 2023-12-17 DIAGNOSIS — Z89429 Acquired absence of other toe(s), unspecified side: Secondary | ICD-10-CM | POA: Diagnosis not present

## 2023-12-17 DIAGNOSIS — M86472 Chronic osteomyelitis with draining sinus, left ankle and foot: Secondary | ICD-10-CM | POA: Diagnosis not present

## 2023-12-17 DIAGNOSIS — L97522 Non-pressure chronic ulcer of other part of left foot with fat layer exposed: Secondary | ICD-10-CM | POA: Diagnosis not present

## 2023-12-17 DIAGNOSIS — M792 Neuralgia and neuritis, unspecified: Secondary | ICD-10-CM | POA: Diagnosis not present

## 2023-12-22 NOTE — Telephone Encounter (Signed)
 PAP: Application for Trulicity has been submitted to [employer], via fax

## 2023-12-25 NOTE — Progress Notes (Signed)
 Kevin Perry                                          MRN: [id number]   12/25/2023   The VBCI Quality Team Specialist reviewed this patient medical record for the purposes of chart review for care gap closure. The following were reviewed: chart review for care gap closure-kidney health evaluation for diabetes:eGFR  and uACR.    VBCI Quality Team

## 2023-12-26 NOTE — Telephone Encounter (Signed)
 PAP: Patient assistance application for Trulicity  has been approved by PAP Companies: [employer] from 02/05/2024 to 02/03/2025. Medication should be delivered to PAP Delivery: Home. For further shipping updates, please contact [employer] Cares at [phone]. Patient ID is: [id number]

## 2023-12-30 ENCOUNTER — Other Ambulatory Visit: Payer: Self-pay | Admitting: Family Medicine

## 2023-12-30 DIAGNOSIS — I10 Essential (primary) hypertension: Secondary | ICD-10-CM

## 2023-12-31 NOTE — Telephone Encounter (Signed)
 Rx 08/04/23 #90 1RF- too soon Requested Prescriptions  Pending Prescriptions Disp Refills   amLODipine  (NORVASC ) 10 MG tablet [Pharmacy Med Name: AMLODIPINE  BESYLATE 10 MG TAB] 90 tablet 1    Sig: TAKE 1 TABLET BY MOUTH ONCE DAILY     Cardiovascular: Calcium  Channel Blockers 2 Passed - 12/31/2023 12:39 PM      Passed - Last BP in normal range    BP Readings from Last 1 Encounters:  10/13/23 124/70         Passed - Last Heart Rate in normal range    Pulse Readings from Last 1 Encounters:  10/13/23 (!) 55         Passed - Valid encounter within last 6 months    Recent Outpatient Visits           2 months ago Type 2 diabetes mellitus with other specified complication, without long-term current use of insulin  [facility])   [place] [facility] [place], Marsa PARAS, DO   4 months ago Type 2 diabetes mellitus with other specified complication, without long-term current use of insulin  [facility])   [place] [facility] [place], Marsa PARAS, DO   6 months ago Community acquired pneumonia of right lower lobe of lung   [place] [facility] [place], Marsa PARAS, DO   7 months ago Acute non-recurrent frontal sinusitis   [place] [facility] [place], Marsa PARAS, [place]

## 2024-01-02 NOTE — Patient Instructions (Signed)
 SURGICAL WAITING ROOM VISITATION  Patients having surgery or a procedure may have no more than 2 support people in the waiting area - these visitors may rotate.    Children under the age of 19 must have an adult with them who is not the patient.  Visitors with respiratory illnesses are discouraged from visiting and should remain at home.  If the patient needs to stay at the hospital during part of their recovery, the visitor guidelines for inpatient rooms apply. Pre-op nurse will coordinate an appropriate time for 1 support person to accompany patient in pre-op.  This support person may not rotate.    Please refer to the [facility] website for the visitor guidelines for Inpatients (after your surgery is over and you are in a regular room).    Your procedure is scheduled on: 01/13/24   Report to [facility] Main Entrance    Report to admitting at 9:30 AM   Call this number if you have problems the morning of surgery [phone]   Do not eat food or drink liquids :After Midnight.          If you have questions, please contact your surgeon's office.   FOLLOW BOWEL PREP AND ANY ADDITIONAL PRE OP INSTRUCTIONS YOU RECEIVED FROM YOUR SURGEON'S OFFICE!!!     Oral Hygiene is also important to reduce your risk of infection.                                    Remember - BRUSH YOUR TEETH THE MORNING OF SURGERY WITH YOUR REGULAR TOOTHPASTE  DENTURES WILL BE REMOVED PRIOR TO SURGERY PLEASE DO NOT APPLY Poly grip OR ADHESIVES!!!   Stop all vitamins and herbal supplements 7 days before surgery.   Take these medicines the morning of surgery with A SIP OF WATER: Amlodipine , Lexapro , Gabapentin , Claritin , Metoprolol   DO NOT TAKE ANY ORAL DIABETIC MEDICATIONS DAY OF YOUR SURGERY  How to Manage Your Diabetes Before and After Surgery  Why is it important to control my blood sugar before and after surgery? Improving blood sugar levels before and after surgery helps healing and can  limit problems. A way of improving blood sugar control is eating a healthy diet by:  Eating less sugar and carbohydrates  Increasing activity/exercise  Talking with your doctor about reaching your blood sugar goals High blood sugars (greater than 180 mg/dL) can raise your risk of infections and slow your recovery, so you will need to focus on controlling your diabetes during the weeks before surgery. Make sure that the doctor who takes care of your diabetes knows about your planned surgery including the date and location.  How do I manage my blood sugar before surgery? Check your blood sugar at least 4 times a day, starting 2 days before surgery, to make sure that the level is not too high or low. Check your blood sugar the morning of your surgery when you wake up and every 2 hours until you get to the Short Stay unit. If your blood sugar is less than 70 mg/dL, you will need to treat for low blood sugar: Do not take insulin . Treat a low blood sugar (less than 70 mg/dL) with  cup of clear juice (cranberry or apple), 4 glucose tablets, OR glucose gel. Recheck blood sugar in 15 minutes after treatment (to make sure it is greater than 70 mg/dL). If your blood sugar is not greater  than 70 mg/dL on recheck, call [phone] for further instructions. Report your blood sugar to the short stay nurse when you get to Short Stay.  If you are admitted to the hospital after surgery: Your blood sugar will be checked by the staff and you will probably be given insulin  after surgery (instead of oral diabetes medicines) to make sure you have good blood sugar levels. The goal for blood sugar control after surgery is 80-180 mg/dL.   WHAT DO I DO ABOUT MY DIABETES MEDICATION?  Do not take oral diabetes medicines (pills) the morning of surgery.  DO not take Truliicity after 01/05/24.  Hold Jardiance 3 days. Do not take after 01/09/24  THE DAY BEFORE SURGERY, take Metformin  as prescribed.  THE MORNING OF  SURGERY, do not take Metformin .  DO NOT TAKE THE FOLLOWING 7 DAYS PRIOR TO SURGERY: Ozempic, Wegovy, Rybelsus (Semaglutide), Byetta (exenatide), Bydureon (exenatide ER), Victoza , Saxenda  (liraglutide ), or Trulicity  (dulaglutide ) Mounjaro (Tirzepatide) Adlyxin (Lixisenatide), Polyethylene Glycol Loxenatide.   Reviewed and Endorsed by [facility] Patient Education Committee, August 2015             You may not have any metal on your body including hair pins, jewelry, and body piercing             Do not wear lotions, powders, perfumes/cologne, or deodorant              Men may shave face and neck.   Do not bring valuables to the hospital. [facility] IS NOT             RESPONSIBLE   FOR VALUABLES.   Contacts, glasses, dentures or bridgework may not be worn into surgery.   Bring small overnight bag day of surgery.   DO NOT BRING YOUR HOME MEDICATIONS TO THE HOSPITAL. PHARMACY WILL DISPENSE MEDICATIONS LISTED ON YOUR MEDICATION LIST TO YOU DURING YOUR ADMISSION IN THE HOSPITAL!    Patients discharged on the day of surgery will not be allowed to drive home.  Someone NEEDS to stay with you for the first 24 hours after anesthesia.              Please read over the following fact sheets you were given: IF YOU HAVE QUESTIONS ABOUT YOUR PRE-OP INSTRUCTIONS PLEASE CALL [phone] [place].   If you received a COVID test during your pre-op visit  it is requested that you wear a mask when out in public, stay away from anyone that may not be feeling well and notify your surgeon if you develop symptoms. If you test positive for Covid or have been in contact with anyone that has tested positive in the last 10 days please notify you surgeon.    [facility] - Preparing for Surgery Before surgery, you can play an important role.  Because skin is not sterile, your skin needs to be as free of germs as possible.  You can reduce the number of germs on your skin by washing with CHG (chlorahexidine gluconate)  soap before surgery.  CHG is an antiseptic cleaner which kills germs and bonds with the skin to continue killing germs even after washing. Please DO NOT use if you have an allergy to CHG or antibacterial soaps.  If your skin becomes reddened/irritated stop using the CHG and inform your nurse when you arrive at Short Stay. Do not shave (including legs and underarms) for at least 48 hours prior to the first CHG shower.  You may shave your face/neck.  Please  follow these instructions carefully:  1.  Shower with CHG Soap the night before surgery ONLY (DO NOT USE THE SOAP THE MORNING OF SURGERY).  2.  If you choose to wash your hair, wash your hair first as usual with your normal  shampoo.  3.  After you shampoo, rinse your hair and body thoroughly to remove the shampoo.                             4.  Use CHG as you would any other liquid soap.  You can apply chg directly to the skin and wash.  Gently with a scrungie or clean washcloth.  5.  Apply the CHG Soap to your body ONLY FROM THE NECK DOWN.   Do   not use on face/ open                           Wound or open sores. Avoid contact with eyes, ears mouth and   genitals (private parts).                       Wash face,  Genitals (private parts) with your normal soap.             6.  Wash thoroughly, paying special attention to the area where your    surgery  will be performed.  7.  Thoroughly rinse your body with warm water from the neck down.  8.  DO NOT shower/wash with your normal soap after using and rinsing off the CHG Soap.                9.  Pat yourself dry with a clean towel.            10.  Wear clean pajamas.            11.  Place clean sheets on your bed the night of your first shower and do not  sleep with pets. Day of Surgery : Do not apply any CHG, lotions/deodorants the morning of surgery.  Please wear clean clothes to the hospital/surgery center.  FAILURE TO FOLLOW THESE INSTRUCTIONS MAY RESULT IN THE CANCELLATION OF YOUR  SURGERY  PATIENT SIGNATURE_________________________________  NURSE SIGNATURE__________________________________  ________________________________________________________________________

## 2024-01-02 NOTE — Progress Notes (Signed)
/  Date of COVID positive in last 90 days:  PCP - Marsa Agar, DO Cardiologist - Annalee Casa, DO  Chest x-ray - 06/28/23 Epic EKG - 07/01/23 Epic Stress Test - 04/12/16 [facility] ECHO - 12/15/23 [facility] Cardiac Cath - 11/17/2019 Epic Pacemaker/ICD device last checked:N/A Spinal Cord Stimulator:N/A  Bowel Prep - N/A  Sleep Study - N/A CPAP -   Fasting Blood Sugar - N/A Checks Blood Sugar _____ times a day  Last dose of GLP1 agonist-  Trulicity  GLP1 instructions:  Do not take after  01/05/24   Last dose of SGLT-2 inhibitors-  Jardiance SGLT-2 instructions:  Do not take after  01/09/24   Blood Thinner Instructions: N/A Last dose:   Time: Aspirin  Instructions: ASA 81 Last Dose:  Activity level:  Can go up a flight of stairs and perform activities of daily living without stopping and without symptoms of chest pain or shortness of breath.  Able to exercise without symptoms  Unable to go up a flight of stairs without symptoms of     Anesthesia review: HTN, CAD, a fib, DM2, CABG x4, non rheumatic mitral valve regurgitation   Patient denies shortness of breath, fever, cough and chest pain at PAT appointment  Patient verbalized understanding of instructions that were given to them at the PAT appointment. Patient was also instructed that they will need to review over the PAT instructions again at home before surgery.

## 2024-01-05 ENCOUNTER — Other Ambulatory Visit: Payer: Self-pay

## 2024-01-05 ENCOUNTER — Encounter: Payer: Self-pay | Admitting: Pharmacist

## 2024-01-05 ENCOUNTER — Other Ambulatory Visit: Admitting: Pharmacist

## 2024-01-05 ENCOUNTER — Encounter (HOSPITAL_COMMUNITY): Payer: Self-pay

## 2024-01-05 ENCOUNTER — Encounter (HOSPITAL_COMMUNITY)
Admission: RE | Admit: 2024-01-05 | Discharge: 2024-01-05 | Disposition: A | Source: Ambulatory Visit | Attending: Urology

## 2024-01-05 VITALS — BP 133/74 | HR 62 | Temp 98.5°F | Resp 12 | Ht 74.0 in | Wt 247.0 lb

## 2024-01-05 DIAGNOSIS — Z7984 Long term (current) use of oral hypoglycemic drugs: Secondary | ICD-10-CM | POA: Insufficient documentation

## 2024-01-05 DIAGNOSIS — Z87891 Personal history of nicotine dependence: Secondary | ICD-10-CM | POA: Diagnosis not present

## 2024-01-05 DIAGNOSIS — M1990 Unspecified osteoarthritis, unspecified site: Secondary | ICD-10-CM | POA: Diagnosis not present

## 2024-01-05 DIAGNOSIS — I10 Essential (primary) hypertension: Secondary | ICD-10-CM | POA: Diagnosis not present

## 2024-01-05 DIAGNOSIS — I480 Paroxysmal atrial fibrillation: Secondary | ICD-10-CM | POA: Diagnosis not present

## 2024-01-05 DIAGNOSIS — N5201 Erectile dysfunction due to arterial insufficiency: Secondary | ICD-10-CM | POA: Diagnosis not present

## 2024-01-05 DIAGNOSIS — I2510 Atherosclerotic heart disease of native coronary artery without angina pectoris: Secondary | ICD-10-CM | POA: Diagnosis not present

## 2024-01-05 DIAGNOSIS — Z951 Presence of aortocoronary bypass graft: Secondary | ICD-10-CM | POA: Insufficient documentation

## 2024-01-05 DIAGNOSIS — I252 Old myocardial infarction: Secondary | ICD-10-CM | POA: Diagnosis not present

## 2024-01-05 DIAGNOSIS — Z01818 Encounter for other preprocedural examination: Secondary | ICD-10-CM

## 2024-01-05 DIAGNOSIS — Z01812 Encounter for preprocedural laboratory examination: Secondary | ICD-10-CM | POA: Insufficient documentation

## 2024-01-05 DIAGNOSIS — Z89422 Acquired absence of other left toe(s): Secondary | ICD-10-CM | POA: Insufficient documentation

## 2024-01-05 DIAGNOSIS — Z87442 Personal history of urinary calculi: Secondary | ICD-10-CM | POA: Diagnosis not present

## 2024-01-05 DIAGNOSIS — E1169 Type 2 diabetes mellitus with other specified complication: Secondary | ICD-10-CM

## 2024-01-05 LAB — BASIC METABOLIC PANEL WITH GFR
Anion gap: 10 (ref 5–15)
BUN: 18 mg/dL (ref 8–23)
CO2: 31 mmol/L (ref 22–32)
Calcium: 10.5 mg/dL — ABNORMAL HIGH (ref 8.9–10.3)
Chloride: 99 mmol/L (ref 98–111)
Creatinine, Ser: 1.13 mg/dL (ref 0.61–1.24)
GFR, Estimated: >60 mL/min (ref >60)
Glucose, Bld: 138 mg/dL — ABNORMAL HIGH (ref 70–99)
Potassium: 3.9 mmol/L (ref 3.5–5.1)
Sodium: 140 mmol/L (ref 135–145)

## 2024-01-05 LAB — CBC
HCT: 49.9 % (ref 39.0–52.0)
Hemoglobin: 17.2 g/dL — ABNORMAL HIGH (ref 13.0–17.0)
MCH: 33.5 pg (ref 26.0–34.0)
MCHC: 34.5 g/dL (ref 30.0–36.0)
MCV: 97.3 fL (ref 80.0–100.0)
Platelets: 177 K/uL (ref 150–400)
RBC: 5.13 MIL/uL (ref 4.22–5.81)
RDW: 13.8 % (ref 11.5–15.5)
WBC: 6.6 K/uL (ref 4.0–10.5)
nRBC: 0 % (ref 0.0–0.2)

## 2024-01-05 LAB — GLUCOSE, CAPILLARY: Glucose-Capillary: 174 mg/dL — ABNORMAL HIGH (ref 70–99)

## 2024-01-05 NOTE — Progress Notes (Signed)
   01/05/2024 Name: Kevin Perry MRN: [id number] DOB: 01/26/1951  Chief Complaint  Patient presents with   Medication Assistance    Kevin Perry is a 74 y.o. year old male who presented for a telephone visit.   They were referred to the pharmacist by their PCP for assistance in managing diabetes, hypertension, hyperlipidemia, and medication access.    Outreach to patient today as scheduled to complete [employer] grant re-enrollment    Assist patient with re-enrolling in [employer] Hypercholesteremia- Medicare Access grant - patient conditionally approved through 01/23/2025. However, Diagnosis Verification is Required Will collaborate with PCP and CPhT for assistance with completing Diagnosis Verification Form Patient aware that this new grant is not active until 12/20. Patient to use previous grant for December Repatha  refill Will send re-enrollment billing info to patient via MyChart message to use for future refills Patient aware to provide to pharmacy and advise pharmacy to bill remaining balance of cost for Repatha  prescription to [employer] grant ONLY AFTER billed to Medicare   Today review Repatha  administration technique. Remind patient to rotate injection sites  Patient to present to office next week to complete lipid panel lab as previously scheduled  Follow Up Plan: Clinical Pharmacist will follow up with patient by telephone on 02/02/2024 at 11:30 AM   Sharyle Sia, PharmD, JAQUELINE, CPP Clinical Pharmacist [facility] [phone]

## 2024-01-05 NOTE — Patient Instructions (Signed)
 You were pre-approved for the [employer] Hypercholesterolemia - Medicare Access grant re-enrollment   You can contact [employer] at:   [email or web address] [phone]   [place] Information:   Enrollment Period: 01/25/2024 through 01/23/2025 [place] Amount: $2,500.00 [employer] Identification Number: [id number]   Pharmacy Billing Info:   ID: [id number] Rx BIN: [id number] Rx PCN: [id number] Group: [id number] Pharmacy Help Desk: [phone]   Please reach out to [facility] for further questions about this grant. You can give me or your provider a call for further questions about your medications.   Thank you!   Sharyle Sia, PharmD, [facility] Clinical Pharmacist [facility] [phone]

## 2024-01-06 LAB — HEMOGLOBIN A1C
Hgb A1c MFr Bld: 6.6 % — ABNORMAL HIGH (ref 4.8–5.6)
Mean Plasma Glucose: 143 mg/dL

## 2024-01-06 NOTE — Anesthesia Preprocedure Evaluation (Addendum)
 Anesthesia Evaluation  Patient identified by MRN, date of birth, ID band Patient awake    Reviewed: Allergy & Precautions, NPO status , Patient's Chart, lab work & pertinent test results  History of Anesthesia Complications (+) PONV and history of anesthetic complications  Airway Mallampati: II  TM Distance: >3 FB Neck ROM: Full    Dental  (+) Dental Advisory Given, Edentulous Upper, Edentulous Lower   Pulmonary former smoker   breath sounds clear to auscultation       Cardiovascular hypertension, Pt. on medications + CAD, + Past MI, + Cardiac Stents and + CABG  + dysrhythmias (Not on AC (post-operative, negative Holter monitor)) Atrial Fibrillation  Rhythm:Regular Rate:Normal  TTE (12/2023 - Care Everywhere): NORMAL LEFT VENTRICULAR SYSTOLIC FUNCTION WITH MILD LVH  ESTIMATED EF: >55%  DIASTOLIC FUNCTION CAN'T BE DETERMINED  NORMAL RIGHT VENTRICULAR SYSTOLIC FUNCTION  VALVULAR REGURGITATION: No AR, TRIVIAL MR, MILD PR, MODERATE TR       Neuro/Psych    GI/Hepatic   Endo/Other  diabetes, Well Controlled, Type 2    Renal/GU Renal disease     Musculoskeletal   Abdominal   Peds  Hematology   Anesthesia Other Findings   Reproductive/Obstetrics                              Anesthesia Physical Anesthesia Plan  ASA: 3  Anesthesia Plan: General   Post-op Pain Management:    Induction: Intravenous  PONV Risk Score and Plan: 1  Airway Management Planned: Oral ETT and LMA  Additional Equipment: None  Intra-op Plan:   Post-operative Plan: Extubation in OR  Informed Consent:      Dental advisory given  Plan Discussed with: CRNA  Anesthesia Plan Comments: (See PAT note from 12/1 )         Anesthesia Quick Evaluation

## 2024-01-06 NOTE — Progress Notes (Signed)
 Case: [id number] Date/Time: 01/13/24 1130   Procedure: INSERTION, PENILE PROSTHESIS   Anesthesia type: General   Diagnosis: Erectile dysfunction due to arterial insufficiency [N52.01]   Pre-op diagnosis: ERECTILE DYSFUNCTION   [facility] 03 / [facility]   Surgeons: Lovie Arlyss CROME, MD       DISCUSSION: Kevin Perry is a 73 yo male with PMH of former smoking, HTN, hx of MI, CAD s/p CABG x 4 (in 2021), PAF not anticoagulated, DM (A1c 6.6), kidney stones, arthritis, osteomyelitis s/p left 2nd toe amputation (03/2023)   Prior anesthesia complications include PONV   Patient follows with Cardiology at [facility] for above hx. Also has a history of postoperative atrial fibrillation with negative Holter monitor.  He is no longer on anticoagulation. Last seen in clinic on 12/02/23. Noted to be doing well from cardiac standpoint. Echo was updated on 12/15/23 and showed normal LVEF, mild PR, moderate TR, mildly enlarged aorta (42mm). Advised f/u in 6 months.   Seen by PCP on 10/13/23. BP and DM controlled.   LD Trulicity : 12/1 LD Jardiance: 12/5  VS: BP 133/74   Pulse 62   Temp 36.9 C (Oral)   Resp 12   Ht 6' 2 (1.88 m)   Wt 112 kg   SpO2 94%   BMI 31.71 kg/m   PROVIDERS: [name], DO Cardiologist - Annalee Casa, DO  LABS: Labs reviewed: Acceptable for surgery. (all labs ordered are listed, but only abnormal results are displayed)  Labs Reviewed  HEMOGLOBIN A1C - Abnormal; Notable for the following components:      Result Value   Hgb A1c MFr Bld 6.6 (*)    All other components within normal limits  BASIC METABOLIC PANEL WITH GFR - Abnormal; Notable for the following components:   Glucose, Bld 138 (*)    Calcium  10.5 (*)    All other components within normal limits  CBC - Abnormal; Notable for the following components:   Hemoglobin 17.2 (*)    All other components within normal limits  GLUCOSE, CAPILLARY - Abnormal; Notable for the following components:    Glucose-Capillary 174 (*)    All other components within normal limits  URINE CULTURE     CTA Chest 06/28/23:   IMPRESSION: 1. No filling defect is identified in the pulmonary arterial tree to suggest pulmonary embolus. 2. Right lower lobe pneumonia with a component of alveolitis. 3. Persistent 1.7 cm calculus in the right renal pelvis with surrounding inflammatory stranding in the renal pelvis adipose tissue. No overt hydronephrosis. 4. Stable nonobstructive 1.2 cm right kidney lower pole calculus. 5. Sigmoid colon diverticulosis. 6. Multilevel lumbar impingement. 7. Aortic Atherosclerosis (ICD10-I70.0) and Emphysema (ICD10-J43.9).   EKG 06/28/23:   Sinus rhythm Atrial premature complexes in couplets Nonspecific repol abnormality, diffuse leads   Echo 12/15/23 ([facility]):  CONCLUSION ------------------------------------------------------------------------------- NORMAL LEFT VENTRICULAR SYSTOLIC FUNCTION WITH MILD LVH ESTIMATED EF: >55% DIASTOLIC FUNCTION CAN'T BE DETERMINED NORMAL RIGHT VENTRICULAR SYSTOLIC FUNCTION VALVULAR REGURGITATION: No AR, TRIVIAL MR, MILD PR, MODERATE TR                            ESTIMATED RVSP: 37 mmHg (Normal) NO VALVULAR STENOSIS  AORTA SIZE IS 4.2 cm (ABNORMAL) Past Medical History:  Diagnosis Date   Anxiety    Arthritis    Cancer (HCC)    skin: head   Coronary artery disease    Diabetes mellitus without complication (HCC)    Dyspnea    History of kidney stones    Hypertension    Hypertensive retinopathy    Myocardial infarction [facility])    Pneumonia    Postoperative nausea 11/27/2019    Past Surgical History:  Procedure Laterality Date   BIOPSY  08/27/2022   Procedure: BIOPSY;  Surgeon: Maryruth Ole DASEN, MD;  Location: [facility];  Service: Endoscopy;;   CATARACT EXTRACTION     CHOLECYSTECTOMY     COLONOSCOPY WITH PROPOFOL  N/A 08/27/2022   Procedure:  COLONOSCOPY WITH PROPOFOL ;  Surgeon: Maryruth Ole DASEN, MD;  Location: [facility];  Service: Endoscopy;  Laterality: N/A;   CORONARY ANGIOPLASTY WITH STENT PLACEMENT N/A 02/04/2006   ESOPHAGOGASTRODUODENOSCOPY (EGD) WITH PROPOFOL  N/A 08/27/2022   Procedure: ESOPHAGOGASTRODUODENOSCOPY (EGD) WITH PROPOFOL ;  Surgeon: Maryruth Ole DASEN, MD;  Location: [facility];  Service: Endoscopy;  Laterality: N/A;   EYE SURGERY     FOOT SURGERY Left    DOS 8.1.14 HALLIX IPJ FUSION, SECOND MET OSTEOTOMY W/SCREW, HAM TOE REPAIR 2,,4 , PARTIAL AMP 3RD DIGIT    GALLBLADDER SURGERY     HEMOSTASIS CLIP PLACEMENT  08/27/2022   Procedure: HEMOSTASIS CLIP PLACEMENT;  Surgeon: Maryruth Ole DASEN, MD;  Location: [facility];  Service: Endoscopy;;   IR URETERAL STENT RIGHT NEW ACCESS W/O SEP NEPHROSTOMY CATH  09/01/2023   KNEE SURERY Right    LEFT HEART CATH AND CORONARY ANGIOGRAPHY Left 11/17/2019   Procedure: LEFT HEART CATH AND CORONARY ANGIOGRAPHY;  Surgeon: Bosie Vinie LABOR, MD;  Location: [facility];  Service: Cardiovascular;  Laterality: Left;   MYRINGOTOMY WITH TUBE PLACEMENT Right 10/25/2021   Procedure: MYRINGOTOMY WITH BUTTERFLY TUBE PLACEMENT;  Surgeon: Edda Mt, MD;  Location: [facility];  Service: ENT;  Laterality: Right;   NEPHROLITHOTOMY Right 09/01/2023   Procedure: NEPHROLITHOTOMY PERCUTANEOUS;  Surgeon: Carolee Sherwood JONETTA Vi, MD;  Location[facility];  Service: Urology;  Laterality: Right;   POLYPECTOMY  08/27/2022   Procedure: POLYPECTOMY;  Surgeon: Maryruth Ole DASEN, MD;  Location: [facility];  Service: Endoscopy;;   SUBMUCOSAL LIFTING INJECTION  08/27/2022   Procedure: SUBMUCOSAL LIFTING INJECTION;  Surgeon: Maryruth Ole DASEN, MD;  Location: [facility];  Service: Endoscopy;;  ELEVIEW ASCENDING COLON POLYP   TOTAL KNEE ARTHROPLASTY Right 08/30/2014   Procedure: TOTAL KNEE ARTHROPLASTY;  Surgeon: Ozell Flake, MD;  Location: [facility];  Service: Orthopedics;   Laterality: Right;    MEDICATIONS:  amLODipine  (NORVASC ) 10 MG tablet   aspirin  EC 81 MG tablet   empagliflozin (JARDIANCE) 25 MG TABS tablet   escitalopram  (LEXAPRO ) 20 MG tablet   folic acid  (FOLVITE ) 1 MG tablet   furosemide  (LASIX ) 40 MG tablet   gabapentin  (NEURONTIN ) 100 MG capsule   lisinopril  (ZESTRIL ) 20 MG tablet   loratadine  (CLARITIN ) 10 MG tablet   metFORMIN  (GLUCOPHAGE ) 500 MG tablet   methotrexate (RHEUMATREX) 2.5 MG tablet   metoprolol  tartrate (LOPRESSOR ) 25 MG tablet   nitroGLYCERIN  (NITROSTAT ) 0.4 MG SL tablet   REPATHA  SURECLICK 140 MG/ML SOAJ   TRULICITY  3 MG/0.5ML SOPN   No current facility-administered medications for this encounter.   Burnard CHRISTELLA Odis DEVONNA [facility] Short Stay/Anesthesiology [facility] Phone [phone] 01/06/2024 3:00 PM

## 2024-01-06 NOTE — Progress Notes (Signed)
 Urine unable to be collected at PAT. Need DOS

## 2024-01-08 ENCOUNTER — Encounter (INDEPENDENT_AMBULATORY_CARE_PROVIDER_SITE_OTHER): Admitting: Ophthalmology

## 2024-01-12 ENCOUNTER — Other Ambulatory Visit: Payer: Self-pay

## 2024-01-12 ENCOUNTER — Encounter (HOSPITAL_COMMUNITY): Payer: Self-pay | Admitting: Urology

## 2024-01-12 ENCOUNTER — Other Ambulatory Visit

## 2024-01-12 DIAGNOSIS — I2510 Atherosclerotic heart disease of native coronary artery without angina pectoris: Secondary | ICD-10-CM | POA: Diagnosis not present

## 2024-01-12 DIAGNOSIS — Z0000 Encounter for general adult medical examination without abnormal findings: Secondary | ICD-10-CM | POA: Diagnosis not present

## 2024-01-12 DIAGNOSIS — N138 Other obstructive and reflux uropathy: Secondary | ICD-10-CM | POA: Diagnosis not present

## 2024-01-12 DIAGNOSIS — I10 Essential (primary) hypertension: Secondary | ICD-10-CM

## 2024-01-12 DIAGNOSIS — E559 Vitamin D deficiency, unspecified: Secondary | ICD-10-CM

## 2024-01-12 DIAGNOSIS — E538 Deficiency of other specified B group vitamins: Secondary | ICD-10-CM | POA: Diagnosis not present

## 2024-01-12 DIAGNOSIS — N401 Enlarged prostate with lower urinary tract symptoms: Secondary | ICD-10-CM | POA: Diagnosis not present

## 2024-01-12 DIAGNOSIS — E785 Hyperlipidemia, unspecified: Secondary | ICD-10-CM | POA: Diagnosis not present

## 2024-01-12 DIAGNOSIS — E1169 Type 2 diabetes mellitus with other specified complication: Secondary | ICD-10-CM

## 2024-01-13 ENCOUNTER — Encounter (HOSPITAL_COMMUNITY): Payer: Self-pay | Admitting: Urology

## 2024-01-13 ENCOUNTER — Ambulatory Visit (HOSPITAL_COMMUNITY): Payer: Self-pay | Admitting: Medical

## 2024-01-13 ENCOUNTER — Encounter (HOSPITAL_COMMUNITY): Admission: RE | Disposition: A | Payer: Self-pay | Source: Ambulatory Visit | Attending: Urology

## 2024-01-13 ENCOUNTER — Ambulatory Visit (HOSPITAL_COMMUNITY)
Admission: RE | Admit: 2024-01-13 | Discharge: 2024-01-13 | Disposition: A | Source: Ambulatory Visit | Attending: Urology | Admitting: Urology

## 2024-01-13 ENCOUNTER — Other Ambulatory Visit: Payer: Self-pay

## 2024-01-13 ENCOUNTER — Ambulatory Visit (HOSPITAL_COMMUNITY): Admitting: Anesthesiology

## 2024-01-13 ENCOUNTER — Other Ambulatory Visit

## 2024-01-13 DIAGNOSIS — I2510 Atherosclerotic heart disease of native coronary artery without angina pectoris: Secondary | ICD-10-CM | POA: Diagnosis not present

## 2024-01-13 DIAGNOSIS — I10 Essential (primary) hypertension: Secondary | ICD-10-CM | POA: Diagnosis not present

## 2024-01-13 DIAGNOSIS — E1169 Type 2 diabetes mellitus with other specified complication: Secondary | ICD-10-CM

## 2024-01-13 DIAGNOSIS — N529 Male erectile dysfunction, unspecified: Secondary | ICD-10-CM | POA: Diagnosis not present

## 2024-01-13 DIAGNOSIS — Z960 Presence of urogenital implants: Secondary | ICD-10-CM

## 2024-01-13 DIAGNOSIS — Z87891 Personal history of nicotine dependence: Secondary | ICD-10-CM | POA: Diagnosis not present

## 2024-01-13 HISTORY — PX: PENILE PROSTHESIS IMPLANT: SHX240

## 2024-01-13 LAB — CBC WITH DIFFERENTIAL/PLATELET
Absolute Lymphocytes: 1739 {cells}/uL (ref 850–3900)
Absolute Monocytes: 347 {cells}/uL (ref 200–950)
Basophils Absolute: 41 {cells}/uL (ref 0–200)
Basophils Relative: 0.8 %
Eosinophils Absolute: 179 {cells}/uL (ref 15–500)
Eosinophils Relative: 3.5 %
HCT: 47 % (ref 39.4–51.1)
Hemoglobin: 16.4 g/dL (ref 13.2–17.1)
MCH: 34.4 pg — ABNORMAL HIGH (ref 27.0–33.0)
MCHC: 34.9 g/dL (ref 31.6–35.4)
MCV: 98.5 fL (ref 81.4–101.7)
MPV: 10.2 fL (ref 7.5–12.5)
Monocytes Relative: 6.8 %
Neutro Abs: 2795 {cells}/uL (ref 1500–7800)
Neutrophils Relative %: 54.8 %
Platelets: 151 Thousand/uL (ref 140–400)
RBC: 4.77 Million/uL (ref 4.20–5.80)
RDW: 13.6 % (ref 11.0–15.0)
Total Lymphocyte: 34.1 %
WBC: 5.1 Thousand/uL (ref 3.8–10.8)

## 2024-01-13 LAB — PSA: PSA: 0.65 ng/mL (ref <=4.00)

## 2024-01-13 LAB — COMPREHENSIVE METABOLIC PANEL WITH GFR
AG Ratio: 2.2 (calc) (ref 1.0–2.5)
ALT: 23 U/L (ref 9–46)
AST: 21 U/L (ref 10–35)
Albumin: 4.4 g/dL (ref 3.6–5.1)
Alkaline phosphatase (APISO): 45 U/L (ref 35–144)
BUN: 15 mg/dL (ref 7–25)
CO2: 31 mmol/L (ref 20–32)
Calcium: 10 mg/dL (ref 8.6–10.3)
Chloride: 98 mmol/L (ref 98–110)
Creat: 1.05 mg/dL (ref 0.70–1.28)
Globulin: 2 g/dL (ref 1.9–3.7)
Glucose, Bld: 178 mg/dL — ABNORMAL HIGH (ref 65–99)
Potassium: 4.7 mmol/L (ref 3.5–5.3)
Sodium: 137 mmol/L (ref 135–146)
Total Bilirubin: 0.6 mg/dL (ref 0.2–1.2)
Total Protein: 6.4 g/dL (ref 6.1–8.1)
eGFR: 75 mL/min/1.73m2 (ref >=60)

## 2024-01-13 LAB — MICROALBUMIN / CREATININE URINE RATIO
Creatinine, Urine: 65 mg/dL (ref 20–320)
Microalb Creat Ratio: 18 mg/g{creat} (ref <30)
Microalb, Ur: 1.2 mg/dL

## 2024-01-13 LAB — LIPID PANEL
Cholesterol: 94 mg/dL (ref <200)
HDL: 35 mg/dL — ABNORMAL LOW (ref >=40)
Non-HDL Cholesterol (Calc): 59 mg/dL (ref <130)
Total CHOL/HDL Ratio: 2.7 (calc) (ref <5.0)
Triglycerides: 525 mg/dL — ABNORMAL HIGH (ref <150)

## 2024-01-13 LAB — HEMOGLOBIN A1C
Hgb A1c MFr Bld: 6.5 % — ABNORMAL HIGH (ref <5.7)
Mean Plasma Glucose: 140 mg/dL
eAG (mmol/L): 7.7 mmol/L

## 2024-01-13 LAB — VITAMIN B12: Vitamin B-12: 276 pg/mL (ref 200–1100)

## 2024-01-13 LAB — VITAMIN D 25 HYDROXY (VIT D DEFICIENCY, FRACTURES): Vit D, 25-Hydroxy: 29 ng/mL — ABNORMAL LOW (ref 30–100)

## 2024-01-13 LAB — TSH: TSH: 1.79 m[IU]/L (ref 0.40–4.50)

## 2024-01-13 LAB — GLUCOSE, CAPILLARY
Glucose-Capillary: 140 mg/dL — ABNORMAL HIGH (ref 70–99)
Glucose-Capillary: 184 mg/dL — ABNORMAL HIGH (ref 70–99)

## 2024-01-13 SURGERY — INSERTION, PENILE PROSTHESIS
Anesthesia: General | Site: Penis

## 2024-01-13 MED ORDER — FLUCONAZOLE IN SODIUM CHLORIDE 200-0.9 MG/100ML-% IV SOLN
200.0000 mg | INTRAVENOUS | Status: DC
  Administered 2024-01-13: 200 mg via INTRAVENOUS
  Filled 2024-01-13: qty 100

## 2024-01-13 MED ORDER — GENTAMICIN SULFATE 40 MG/ML IJ SOLN
5.0000 mg/kg | INTRAVENOUS | Status: AC
  Administered 2024-01-13: 470 mg via INTRAVENOUS
  Filled 2024-01-13: qty 11.75

## 2024-01-13 MED ORDER — LIDOCAINE HCL 1 % IJ SOLN
INTRAMUSCULAR | Status: AC
  Filled 2024-01-13: qty 20

## 2024-01-13 MED ORDER — ORAL CARE MOUTH RINSE: 15.0000 mL | Freq: Once | OROMUCOSAL | Status: AC

## 2024-01-13 MED ORDER — BUPIVACAINE HCL 0.5 % IJ SOLN
INTRAMUSCULAR | Status: DC | PRN
  Administered 2024-01-13: 10 mL

## 2024-01-13 MED ORDER — DEXAMETHASONE SOD PHOSPHATE PF 10 MG/ML IJ SOLN
INTRAMUSCULAR | Status: DC | PRN
  Administered 2024-01-13: 6 mg via INTRAVENOUS

## 2024-01-13 MED ORDER — ROCURONIUM BROMIDE 10 MG/ML (PF) SYRINGE
PREFILLED_SYRINGE | INTRAVENOUS | Status: DC | PRN
  Administered 2024-01-13: 80 mg via INTRAVENOUS
  Administered 2024-01-13: 20 mg via INTRAVENOUS

## 2024-01-13 MED ORDER — LIDOCAINE HCL 1 % IJ SOLN
INTRAMUSCULAR | Status: DC | PRN
  Administered 2024-01-13: 10 mL

## 2024-01-13 MED ORDER — IRRISEPT - 450ML BOTTLE WITH 0.05% CHG IN STERILE WATER, USP 99.95% OPTIME
TOPICAL | Status: DC | PRN
  Administered 2024-01-13: 900 mL

## 2024-01-13 MED ORDER — PROPOFOL 10 MG/ML IV BOLUS
INTRAVENOUS | Status: DC | PRN
  Administered 2024-01-13: 30 mg via INTRAVENOUS
  Administered 2024-01-13: 100 mg via INTRAVENOUS
  Administered 2024-01-13: 20 mg via INTRAVENOUS

## 2024-01-13 MED ORDER — SUGAMMADEX SODIUM 200 MG/2ML IV SOLN
INTRAVENOUS | Status: DC | PRN
  Administered 2024-01-13: 240 mg via INTRAVENOUS

## 2024-01-13 MED ORDER — OXYCODONE HCL 5 MG/5ML PO SOLN: 5.0000 mg | Freq: Once | ORAL | Status: DC | PRN

## 2024-01-13 MED ORDER — VANCOMYCIN HCL 1500 MG/300ML IV SOLN
1500.0000 mg | INTRAVENOUS | Status: DC
  Filled 2024-01-13: qty 300

## 2024-01-13 MED ORDER — FENTANYL CITRATE (PF) 100 MCG/2ML IJ SOLN
INTRAMUSCULAR | Status: AC
  Filled 2024-01-13: qty 2

## 2024-01-13 MED ORDER — CHLORHEXIDINE GLUCONATE 4 % EX SOLN: Freq: Once | CUTANEOUS | Status: DC

## 2024-01-13 MED ORDER — PROPOFOL 10 MG/ML IV BOLUS
INTRAVENOUS | Status: AC
  Filled 2024-01-13: qty 20

## 2024-01-13 MED ORDER — LACTATED RINGERS IV SOLN: INTRAVENOUS | Status: DC

## 2024-01-13 MED ORDER — AMISULPRIDE (ANTIEMETIC) 5 MG/2ML IV SOLN: 10.0000 mg | Freq: Once | INTRAVENOUS | Status: DC | PRN

## 2024-01-13 MED ORDER — FENTANYL CITRATE (PF) 100 MCG/2ML IJ SOLN
INTRAMUSCULAR | Status: DC | PRN
  Administered 2024-01-13: 50 ug via INTRAVENOUS
  Administered 2024-01-13: 25 ug via INTRAVENOUS
  Administered 2024-01-13: 75 ug via INTRAVENOUS
  Administered 2024-01-13 (×2): 25 ug via INTRAVENOUS

## 2024-01-13 MED ORDER — PHENYLEPHRINE 80 MCG/ML (10ML) SYRINGE FOR IV PUSH (FOR BLOOD PRESSURE SUPPORT)
PREFILLED_SYRINGE | INTRAVENOUS | Status: DC | PRN
  Administered 2024-01-13 (×2): 80 ug via INTRAVENOUS
  Administered 2024-01-13: 40 ug via INTRAVENOUS
  Administered 2024-01-13 (×2): 80 ug via INTRAVENOUS

## 2024-01-13 MED ORDER — SODIUM CHLORIDE 0.9 % IR SOLN
Status: DC | PRN
  Administered 2024-01-13: 1000 mL

## 2024-01-13 MED ORDER — ONDANSETRON HCL 4 MG/2ML IJ SOLN
INTRAMUSCULAR | Status: DC | PRN
  Administered 2024-01-13: 4 mg via INTRAVENOUS

## 2024-01-13 MED ORDER — EPHEDRINE SULFATE-NACL 50-0.9 MG/10ML-% IV SOSY
PREFILLED_SYRINGE | INTRAVENOUS | Status: DC | PRN
  Administered 2024-01-13 (×2): 5 mg via INTRAVENOUS
  Administered 2024-01-13: 10 mg via INTRAVENOUS
  Administered 2024-01-13: 5 mg via INTRAVENOUS

## 2024-01-13 MED ORDER — FENTANYL CITRATE (PF) 50 MCG/ML IJ SOSY: 25.0000 ug | PREFILLED_SYRINGE | INTRAMUSCULAR | Status: DC | PRN

## 2024-01-13 MED ORDER — SULFAMETHOXAZOLE-TRIMETHOPRIM 800-160 MG PO TABS: 1.0000 | ORAL_TABLET | Freq: Two times a day (BID) | ORAL | 0 refills | Status: AC

## 2024-01-13 MED ORDER — MIDAZOLAM HCL 2 MG/2ML IJ SOLN
INTRAMUSCULAR | Status: AC
  Filled 2024-01-13: qty 2

## 2024-01-13 MED ORDER — CHLORHEXIDINE GLUCONATE 0.12 % MT SOLN
15.0000 mL | Freq: Once | OROMUCOSAL | Status: AC
  Administered 2024-01-13: 15 mL via OROMUCOSAL

## 2024-01-13 MED ORDER — ONDANSETRON HCL 4 MG/2ML IJ SOLN
INTRAMUSCULAR | Status: AC
  Filled 2024-01-13: qty 4

## 2024-01-13 MED ORDER — LIDOCAINE HCL (PF) 2 % IJ SOLN
INTRAMUSCULAR | Status: AC
  Filled 2024-01-13: qty 10

## 2024-01-13 MED ORDER — ACETAMINOPHEN 10 MG/ML IV SOLN: 1000.0000 mg | Freq: Once | INTRAVENOUS | Status: DC | PRN

## 2024-01-13 MED ORDER — SUGAMMADEX SODIUM 200 MG/2ML IV SOLN
INTRAVENOUS | Status: AC
  Filled 2024-01-13: qty 2

## 2024-01-13 MED ORDER — LIDOCAINE 2% (20 MG/ML) 5 ML SYRINGE
INTRAMUSCULAR | Status: DC | PRN
  Administered 2024-01-13: 100 mg via INTRAVENOUS

## 2024-01-13 MED ORDER — EPHEDRINE 5 MG/ML INJ
INTRAVENOUS | Status: AC
  Filled 2024-01-13: qty 5

## 2024-01-13 MED ORDER — ONDANSETRON HCL 4 MG/2ML IJ SOLN: 4.0000 mg | Freq: Once | INTRAMUSCULAR | Status: DC | PRN

## 2024-01-13 MED ORDER — STERILE WATER FOR IRRIGATION IR SOLN
Status: DC | PRN
  Administered 2024-01-13: 1000 mL

## 2024-01-13 MED ORDER — BUPIVACAINE HCL (PF) 0.5 % IJ SOLN
INTRAMUSCULAR | Status: AC
  Filled 2024-01-13: qty 30

## 2024-01-13 MED ORDER — MUPIROCIN 2 % EX OINT: 1.0000 | TOPICAL_OINTMENT | Freq: Once | CUTANEOUS | Status: DC

## 2024-01-13 MED ORDER — OXYCODONE HCL 5 MG PO TABS: 5.0000 mg | ORAL_TABLET | Freq: Four times a day (QID) | ORAL | 0 refills | Status: AC | PRN

## 2024-01-13 MED ORDER — ACETAMINOPHEN 500 MG PO TABS: 1000.0000 mg | ORAL_TABLET | Freq: Four times a day (QID) | ORAL | 0 refills | Status: AC

## 2024-01-13 MED ORDER — MIDAZOLAM HCL 5 MG/5ML IJ SOLN
INTRAMUSCULAR | Status: DC | PRN
  Administered 2024-01-13: 2 mg via INTRAVENOUS

## 2024-01-13 MED ORDER — VANCOMYCIN HCL 1000 MG IV SOLR
INTRAVENOUS | Status: DC | PRN
  Administered 2024-01-13: 1500 mg via INTRAVENOUS

## 2024-01-13 MED ORDER — INSULIN ASPART 100 UNIT/ML IJ SOLN: 0.0000 [IU] | INTRAMUSCULAR | Status: DC | PRN

## 2024-01-13 MED ORDER — OXYCODONE HCL 5 MG PO TABS: 5.0000 mg | ORAL_TABLET | Freq: Once | ORAL | Status: DC | PRN

## 2024-01-13 SURGICAL SUPPLY — 48 items
BAG URINE DRAIN 2000ML AR STRL (UROLOGICAL SUPPLIES) IMPLANT
BLADE SURG 15 STRL LF DISP TIS (BLADE) ×1 IMPLANT
BNDG GAUZE DERMACEA FLUFF 4 (GAUZE/BANDAGES/DRESSINGS) ×1 IMPLANT
BRIEF MESH DISP LRG (UNDERPADS AND DIAPERS) ×1 IMPLANT
CATH COUDE 5CC RIBBED (CATHETERS) ×1 IMPLANT
CHLORAPREP W/TINT 26 (MISCELLANEOUS) ×2 IMPLANT
COVER MAYO STAND STRL (DRAPES) ×1 IMPLANT
COVER SURGICAL LIGHT HANDLE (MISCELLANEOUS) ×1 IMPLANT
DERMABOND ADVANCED .7 DNX12 (GAUZE/BANDAGES/DRESSINGS) ×1 IMPLANT
DRAIN CHANNEL 10F 3/8 F FF (DRAIN) ×1 IMPLANT
DRAPE INCISE IOBAN 66X45 STRL (DRAPES) ×1 IMPLANT
DRAPE LAPAROTOMY T 98X78 PEDS (DRAPES) ×1 IMPLANT
DRSG TEGADERM 4X4.75 (GAUZE/BANDAGES/DRESSINGS) ×1 IMPLANT
ELECT REM PT RETURN 15FT ADLT (MISCELLANEOUS) ×1 IMPLANT
EVACUATOR SILICONE 100CC (DRAIN) ×1 IMPLANT
GAUZE SPONGE 2X2 8PLY STRL LF (GAUZE/BANDAGES/DRESSINGS) IMPLANT
GAUZE SPONGE 2X2 STRL 8-PLY (GAUZE/BANDAGES/DRESSINGS) ×1 IMPLANT
GLOVE BIO SURGEON STRL SZ7 (GLOVE) ×1 IMPLANT
GLOVE BIOGEL PI IND STRL 7.0 (GLOVE) ×1 IMPLANT
GOWN STRL REUS W/ TWL XL LVL3 (GOWN DISPOSABLE) ×1 IMPLANT
HOLDER FOLEY CATH W/STRAP (MISCELLANEOUS) IMPLANT
IMPL SNAPCONE RTE CX 2.0 (Urological Implant) IMPLANT
KIT ACCESSORY AMS 700 PUMP (Urological Implant) IMPLANT
KIT BASIN OR (CUSTOM PROCEDURE TRAY) ×1 IMPLANT
KIT TURNOVER KIT A (KITS) ×1 IMPLANT
LAVAGE JET IRRISEPT WOUND (IRRIGATION / IRRIGATOR) IMPLANT
NDL HYPO 22X1.5 SAFETY MO (MISCELLANEOUS) ×1 IMPLANT
NS IRRIG 1000ML POUR BTL (IV SOLUTION) ×1 IMPLANT
PACK GENERAL/GYN (CUSTOM PROCEDURE TRAY) ×1 IMPLANT
PLUG CATH AND CAP STRL 200 (CATHETERS) ×1 IMPLANT
PUMP PENILE AMS 12X18 (Pump) IMPLANT
RESERVOIR FLAT IZ 100ML (Miscellaneous) IMPLANT
RETRACTOR DEEP SCROTAL PENILE (MISCELLANEOUS) IMPLANT
RETRACTOR WILSON SYSTEM (INSTRUMENTS) IMPLANT
SET COLLECT BLD 21X.75 12 PB G (NEEDLE) ×1 IMPLANT
SOLUTION PRONTOSAN WOUND 350ML (IRRIGATION / IRRIGATOR) IMPLANT
SURGILUBE 2OZ TUBE FLIPTOP (MISCELLANEOUS) IMPLANT
SUT ETHILON 3 0 PS 1 (SUTURE) ×1 IMPLANT
SUT MNCRL AB 4-0 PS2 18 (SUTURE) ×1 IMPLANT
SUT VIC AB 2-0 UR6 27 (SUTURE) ×4 IMPLANT
SUT VIC AB 3-0 SH 27X BRD (SUTURE) ×2 IMPLANT
SYR 10ML LL (SYRINGE) ×2 IMPLANT
SYR 20ML LL LF (SYRINGE) ×1 IMPLANT
SYR 50ML LL SCALE MARK (SYRINGE) ×3 IMPLANT
SYR CONTROL 10ML LL (SYRINGE) ×1 IMPLANT
TOWEL GREEN STERILE FF (TOWEL DISPOSABLE) ×1 IMPLANT
TOWEL OR DSP ST BLU DLX 10/PK (DISPOSABLE) ×1 IMPLANT
WATER STERILE IRR 500ML POUR (IV SOLUTION) ×1 IMPLANT

## 2024-01-13 NOTE — Anesthesia Postprocedure Evaluation (Signed)
 Anesthesia Post Note  Patient: Kevin Perry [facility]  Procedure(s) Performed: INSERTION, PENILE PROSTHESIS (Penis)     Patient location during evaluation: PACU Anesthesia Type: General Level of consciousness: awake and alert Pain management: pain level controlled Vital Signs Assessment: post-procedure vital signs reviewed and stable Respiratory status: spontaneous breathing, nonlabored ventilation, respiratory function stable and patient connected to nasal cannula oxygen  Cardiovascular status: blood pressure returned to baseline and stable Postop Assessment: no apparent nausea or vomiting Anesthetic complications: no   No notable events documented.  Last Vitals:  Vitals:   01/13/24 1515 01/13/24 1528  BP: 132/69 138/78  Pulse: 72 (!) 56  Resp: 17 15  Temp:  36.4 C  SpO2: 93% 96%    Last Pain:  Vitals:   01/13/24 1528  TempSrc:   PainSc: 0-No pain                 [name]

## 2024-01-13 NOTE — Transfer of Care (Signed)
 Immediate Anesthesia Transfer of Care Note  Patient: Kevin Perry  Procedure(s) Performed: Procedure(s): INSERTION, PENILE PROSTHESIS (N/A)  Patient Location: PACU  Anesthesia Type:General  Level of Consciousness:  sedated, patient cooperative and responds to stimulation  Airway & Oxygen  Therapy:Patient Spontanous Breathing and Patient connected to face mask oxgen  Post-op Assessment:  Report given to PACU RN and Post -op Vital signs reviewed and stable  Post vital signs:  Reviewed and stable  Last Vitals:  Vitals:   01/13/24 0908 01/13/24 1403  BP: (!) 139/93 (!) 142/94  Pulse: 64 70  Resp: 18 19  Temp: 36.8 C   SpO2: 94% 95%    Complications: No apparent anesthesia complications

## 2024-01-13 NOTE — Anesthesia Procedure Notes (Signed)
 Procedure Name: Intubation Date/Time: 01/13/2024 12:17 PM  Performed by: Vincenzo Show, CRNAPre-anesthesia Checklist: Patient identified, Emergency Drugs available, Suction available, Patient being monitored and Timeout performed Patient Re-evaluated:Patient Re-evaluated prior to induction Oxygen  Delivery Method: Circle system utilized Preoxygenation: Pre-oxygenation with 100% oxygen  Induction Type: IV induction Ventilation: Mask ventilation without difficulty and Oral airway inserted - appropriate to patient size Laryngoscope Size: Mac and 4 Grade View: Grade I Tube type: Oral Tube size: 7.5 mm Number of attempts: 1 Airway Equipment and Method: Stylet Placement Confirmation: ETT inserted through vocal cords under direct vision, positive ETCO2, CO2 detector and breath sounds checked- equal and bilateral Secured at: 22 cm Tube secured with: Tape Dental Injury: Teeth and Oropharynx as per pre-operative assessment  Comments: ATOI.

## 2024-01-13 NOTE — Op Note (Signed)
 PATIENT:  Kevin Perry  PRE-OPERATIVE DIAGNOSIS:  Organic erectile dysfunction  POST-OPERATIVE DIAGNOSIS:  Same  PROCEDURE:   3 piece inflatable penile prosthesis (BS/AMS) Injection of pharmacoagent into penis  SURGEON:  Herlene Foot MD  ASST: Ole Bourdon NP  INDICATION: He has had long-standing organic erectile dysfunction and refractory to other modes of treatment. He has elected to proceed with prosthesis implantation.  ANESTHESIA:  General  EBL:  50 cc  Device: 3 piece AMS CX 700: 98 cc reservoir, 18 cm cylinders and 2 cm rear-tip extenders on right and left sides  LOCAL MEDICATIONS USED:   penile block done with 10 cc lidocaine /marcaine  mixture 10 cc injected into corpora directly via butterfly needle  SPECIMEN: None  Description of procedure: The patient was taken to the major operating room, placed on the table and administered general anesthesia in the supine position. His genitalia was then prepped with chlorhexidine  x 2. He was draped in the usual sterile fashion, and I used Ioban on the field. An official timeout was then performed.  A dorsal penile block was performed. A butterfly needle was then used to inject normal saline into the penis to give an artificial erection. There was no clinically significant curvature or deformity. I then injected 10 cc of lidocaine /marcaine  into the penis.   A 14 French coude catheter was then placed in the bladder and the bladder was drained and the catheter was plugged. A midline penoscrotal incision was then made and the dissection was carried down to the corpora and urethra. The lonestar retractor was positioned so as to have excellent exposure. 2-0 Vicryl sutures were then placed proximally in each corpus cavernosum to serve as stay sutures. An incision was then made in the corpus cavernosum first on the left-hand side with the bovie. Tamra were used to gently dilate the opening. I then dilated the corpus cavernosum with the  a 12 Fr brooks dilator distally and proximally. Field goal post tests were performed and there was no evidence of perforations or crossover. I then irrigated the corpus cavernosum with antibiotic solution and measured the distance proximally and distally from the stay suture and was found to be 11 and 9 cm, respectively.I then turned my attention to the contralateral corpus cavernosum and placed my stay sutures, made my corporotomy and dilated the corpus cavernosum in an identical fashion. This was measured and also was found to be 11.5 cm proximally and 8.5 distally. It was irrigated with anastomotic solution as was the scrotum. I then chose an 18 cm cylinder set with 2 cm rear-tip extenders and these were prepped while I prepared the site for reservoir placement.  I then digitally probed into the Left external inguinal ring. My finger was used to poke through the conjoint tendon in the posterior wall of the ring. I used my finger and a sponge stick to ensure I was in the appropriate space, and to clear room for the reservoir. I irrigated the space with anastomotic solution and then placed the reservoir in this location. I then filled the reservoir with 98 cc of sterile saline, and checked to confirm proper position. There was minimal backpressure with the reservoir max-filled.  Attention was redirected to the corporotomies where the cylinders were then placed by first fixing the suture to the distal aspect of the right cylinder to a straight needle. This was then loaded on the [facility] inserter and passed through the corporotomy and distally. I then advanced the straight needle with the [facility] inserter  out through the glans and this was grasped with a hemostat and pulled through the glans and the suture was secured with a hemostat. I then performed an identical maneuver on the contralateral side. After this was performed I irrigated both corpus cavernosum; there was no evidence of urethral perforation. I  inserted the distal portion of the cylinder through the corporotomies and pulled this to the end of the corpora with the suture. The proximal aspect with the rear-tip extender was then passed through the corporotomy and into the seated position on each side. I then connected reservoir tubing to a syringe filled with sterile saline and inflated the device. I noted a good straight erection with both cylinders equidistant under the glans and no buckling of the cylinders. I therefore deflated the device and closed the corporotomies with used my previously placed stay sutures.   I then grasped the scrotal skin in the midline with a babcock, and used a hemostat to dissect down to the dependent-most portion of the scrotum. The nasal speculum was inserted into this space, and facilitated placement of the pump. The cylinder was then connected to the pump after excising the excess tubing with appropriate shodded hemostats in place and then I used the supplied connectors to make the connection. I then again cycled the device with the pump and it cycled properly. I deflated the device and pumped it up about three quarters of the way to aid with hemostasis. I irrigated the wound one last time with antibiotic irrigation and then closed the deep scrotal tissue over the tubing and pump with running 3-0 monocryl suture. I placed a 10 Fr blake drain over the corporotomies. A second layer was then closed over this first layer with running 3- 0 monocryl, and running skin suture w/ 4-0 monocryl performed. Incision dressed with dermabond.  A mummy wrap was applied. The catheter was removed, and drain connected to suction bulb and the patient was awakened and taken recovery room in stable and satisfactory condition. He tolerated the procedure well and there were no intraoperative complications. Needle sponge and instrument counts were correct at the end of the operation.

## 2024-01-13 NOTE — H&P (Signed)
 H&P  History of Present Illness: Kevin Perry is a 73 y.o. year old M who presents today for insertion of an inflatable penile prosthesis  No acute complaints  Past Medical History:  Diagnosis Date   Anxiety    Arthritis    Cancer (HCC)    skin: head   Coronary artery disease    Diabetes mellitus without complication (HCC)    Dyspnea    History of kidney stones    Hypertension    Hypertensive retinopathy    Myocardial infarction [facility])    Pneumonia    Postoperative nausea 11/27/2019    Past Surgical History:  Procedure Laterality Date   BIOPSY  08/27/2022   Procedure: BIOPSY;  Surgeon: Maryruth Ole DASEN, MD;  Location: [facility];  Service: Endoscopy;;   CATARACT EXTRACTION     CHOLECYSTECTOMY     COLONOSCOPY WITH PROPOFOL  N/A 08/27/2022   Procedure: COLONOSCOPY WITH PROPOFOL ;  Surgeon: Maryruth Ole DASEN, MD;  Location: [facility];  Service: Endoscopy;  Laterality: N/A;   CORONARY ANGIOPLASTY WITH STENT PLACEMENT N/A 02/04/2006   ESOPHAGOGASTRODUODENOSCOPY (EGD) WITH PROPOFOL  N/A 08/27/2022   Procedure: ESOPHAGOGASTRODUODENOSCOPY (EGD) WITH PROPOFOL ;  Surgeon: Maryruth Ole DASEN, MD;  Location: [facility];  Service: Endoscopy;  Laterality: N/A;   EYE SURGERY     FOOT SURGERY Left    DOS 8.1.14 HALLIX IPJ FUSION, SECOND MET OSTEOTOMY W/SCREW, HAM TOE REPAIR 2,,4 , PARTIAL AMP 3RD DIGIT    GALLBLADDER SURGERY     HEMOSTASIS CLIP PLACEMENT  08/27/2022   Procedure: HEMOSTASIS CLIP PLACEMENT;  Surgeon: Maryruth Ole DASEN, MD;  Location: [facility];  Service: Endoscopy;;   IR URETERAL STENT RIGHT NEW ACCESS W/O SEP NEPHROSTOMY CATH  09/01/2023   KNEE SURERY Right    LEFT HEART CATH AND CORONARY ANGIOGRAPHY Left 11/17/2019   Procedure: LEFT HEART CATH AND CORONARY ANGIOGRAPHY;  Surgeon: Bosie Vinie LABOR, MD;  Location: [facility];  Service: Cardiovascular;  Laterality: Left;   MYRINGOTOMY WITH TUBE PLACEMENT Right 10/25/2021   Procedure: MYRINGOTOMY  WITH BUTTERFLY TUBE PLACEMENT;  Surgeon: Edda Mt, MD;  Location: [facility];  Service: ENT;  Laterality: Right;   NEPHROLITHOTOMY Right 09/01/2023   Procedure: NEPHROLITHOTOMY PERCUTANEOUS;  Surgeon: Carolee Sherwood JONETTA Cheyne, MD;  [facility];  Service: Urology;  Laterality: Right;   POLYPECTOMY  08/27/2022   Procedure: POLYPECTOMY;  Surgeon: Maryruth Ole DASEN, MD;  Location: [facility];  Service: Endoscopy;;   SUBMUCOSAL LIFTING INJECTION  08/27/2022   Procedure: SUBMUCOSAL LIFTING INJECTION;  Surgeon: Maryruth Ole DASEN, MD;  Location: [facility];  Service: Endoscopy;;  ELEVIEW ASCENDING COLON POLYP   TOTAL KNEE ARTHROPLASTY Right 08/30/2014   Procedure: TOTAL KNEE ARTHROPLASTY;  Surgeon: Ozell Flake, MD;  Location: [facility];  Service: Orthopedics;  Laterality: Right;    Home Medications:  Current Meds  Medication Sig   amLODipine  (NORVASC ) 10 MG tablet Take 1 tablet (10 mg total) by mouth daily.   aspirin  EC 81 MG tablet Take 81 mg by mouth daily. Swallow whole.   empagliflozin (JARDIANCE) 25 MG TABS tablet Take 25 mg by mouth daily.   escitalopram  (LEXAPRO ) 20 MG tablet Take 1 tablet (20 mg total) by mouth daily.   folic acid  (FOLVITE ) 1 MG tablet Take 1 mg by mouth See admin instructions. Take 1 mg daily except skip dose on Wednesdays (methotrexate day)   furosemide  (LASIX ) 40 MG tablet TAKE 1 TABLET BY MOUTH ONCE DAILY   gabapentin  (NEURONTIN ) 100 MG capsule Take 1  capsule (100 mg total) by mouth 2 (two) times daily.   lisinopril  (ZESTRIL ) 20 MG tablet Take 1 tablet by mouth 2 (two) times daily.   loratadine  (CLARITIN ) 10 MG tablet Take 10 mg by mouth every morning.   metFORMIN  (GLUCOPHAGE ) 500 MG tablet TAKE 2 TABLETS BY MOUTH ONCE EVERY MORNING WITH BREAKFAST AND 1 TABLET ONCEEVERY EVENING WITH DINNER   methotrexate (RHEUMATREX) 2.5 MG tablet Take 10 mg by mouth once a week. On Wednesdays   metoprolol  tartrate (LOPRESSOR ) 25 MG tablet Take 1 tablet (25 mg  total) by mouth 2 (two) times daily.   nitroGLYCERIN  (NITROSTAT ) 0.4 MG SL tablet Place 0.4 mg under the tongue every 5 (five) minutes as needed for chest pain.   REPATHA  SURECLICK 140 MG/ML SOAJ Inject 140 mg as directed every 14 (fourteen) days.   TRULICITY  3 MG/0.5ML SOPN INJECT 3 MG (0.5 ML) UNDER THE SKIN ONCE A WEEK    Allergies:  Allergies  Allergen Reactions   Naproxen Swelling and Anaphylaxis   Rosuvastatin  Swelling    Family History  Problem Relation Age of Onset   Heart attack Mother    Diabetes Father    Hypertension Father    Heart attack Sister     Social History:  reports that he quit smoking about 19 years ago. His smoking use included cigarettes. He quit smokeless tobacco use about 19 years ago. He reports that he does not currently use alcohol. He reports that he does not use drugs.  ROS: A complete review of systems was performed.  All systems are negative except for pertinent findings as noted.  Physical Exam:  Vital signs in last 24 hours: Temp:  [98.2 F (36.8 C)] 98.2 F (36.8 C) (12/09 0908) Pulse Rate:  [64] 64 (12/09 0908) Resp:  [18] 18 (12/09 0908) BP: (139)/(93) 139/93 (12/09 0908) SpO2:  [94 %] 94 % (12/09 0908) Weight:  [115.7 kg] 115.7 kg (12/09 0908) Constitutional:  Alert and oriented, No acute distress Cardiovascular: Regular rate and rhythm Respiratory: Normal respiratory effort, Lungs clear bilaterally GI: Abdomen is soft, nontender, nondistended, no abdominal masses Lymphatic: No lymphadenopathy Neurologic: Grossly intact, no focal deficits Psychiatric: Normal mood and affect   Laboratory Data:  Recent Labs    01/12/24 0941  WBC 5.1  HGB 16.4  HCT 47.0  PLT 151    Recent Labs    01/12/24 0941  NA 137  K 4.7  CL 98  GLUCOSE 178*  BUN 15  CALCIUM  10.0  CREATININE 1.05     Results for orders placed or performed during the hospital encounter of 01/13/24 (from the past 24 hours)  Glucose, capillary     Status:  Abnormal   Collection Time: 01/13/24  9:02 AM  Result Value Ref Range   Glucose-Capillary 140 (H) 70 - 99 mg/dL   Comment 1 Notify RN    No results found for this or any previous visit (from the past 240 hours).  Renal Function: Recent Labs    01/12/24 0941  CREATININE 1.05   Estimated Creatinine Clearance: 84.7 mL/min (by C-G formula based on SCr of 1.05 mg/dL).  Radiologic Imaging: No results found.  Assessment:  Kevin Perry is a 73 y.o. year old M with ED refractory to other medical treatments  Plan:  To OR as planned for IPP. Procedure and risks reviewed, including but not limited to bleeding, infection, implant infection, implant malfunction, implant malplacement, erosion, damage to adjacent structures, pain, urinary retention. All questions answered  Herlene Foot, MD 01/13/2024, 11:36 AM  [facility] Pager: [phone]

## 2024-01-13 NOTE — Discharge Instructions (Signed)

## 2024-01-14 ENCOUNTER — Encounter (HOSPITAL_COMMUNITY): Payer: Self-pay | Admitting: Urology

## 2024-01-14 ENCOUNTER — Encounter (HOSPITAL_COMMUNITY): Payer: Self-pay

## 2024-01-14 DIAGNOSIS — I2510 Atherosclerotic heart disease of native coronary artery without angina pectoris: Secondary | ICD-10-CM | POA: Diagnosis not present

## 2024-01-14 DIAGNOSIS — I340 Nonrheumatic mitral (valve) insufficiency: Secondary | ICD-10-CM | POA: Diagnosis not present

## 2024-01-14 DIAGNOSIS — E118 Type 2 diabetes mellitus with unspecified complications: Secondary | ICD-10-CM | POA: Diagnosis not present

## 2024-01-14 DIAGNOSIS — E782 Mixed hyperlipidemia: Secondary | ICD-10-CM | POA: Diagnosis not present

## 2024-01-14 DIAGNOSIS — I5033 Acute on chronic diastolic (congestive) heart failure: Secondary | ICD-10-CM | POA: Diagnosis not present

## 2024-01-14 DIAGNOSIS — Z951 Presence of aortocoronary bypass graft: Secondary | ICD-10-CM | POA: Diagnosis not present

## 2024-01-16 ENCOUNTER — Encounter (HOSPITAL_COMMUNITY): Payer: Self-pay

## 2024-01-21 ENCOUNTER — Encounter: Payer: Self-pay | Admitting: Family Medicine

## 2024-01-21 ENCOUNTER — Ambulatory Visit: Admitting: Family Medicine

## 2024-01-21 VITALS — BP 130/70 | HR 66 | Ht 74.0 in | Wt 249.1 lb

## 2024-01-21 DIAGNOSIS — Z951 Presence of aortocoronary bypass graft: Secondary | ICD-10-CM | POA: Diagnosis not present

## 2024-01-21 DIAGNOSIS — I10 Essential (primary) hypertension: Secondary | ICD-10-CM | POA: Diagnosis not present

## 2024-01-21 DIAGNOSIS — E1169 Type 2 diabetes mellitus with other specified complication: Secondary | ICD-10-CM

## 2024-01-21 DIAGNOSIS — G720 Drug-induced myopathy: Secondary | ICD-10-CM

## 2024-01-21 DIAGNOSIS — I2510 Atherosclerotic heart disease of native coronary artery without angina pectoris: Secondary | ICD-10-CM | POA: Diagnosis not present

## 2024-01-21 DIAGNOSIS — Z7985 Long-term (current) use of injectable non-insulin antidiabetic drugs: Secondary | ICD-10-CM

## 2024-01-21 DIAGNOSIS — E113393 Type 2 diabetes mellitus with moderate nonproliferative diabetic retinopathy without macular edema, bilateral: Secondary | ICD-10-CM | POA: Diagnosis not present

## 2024-01-21 DIAGNOSIS — Z89421 Acquired absence of other right toe(s): Secondary | ICD-10-CM

## 2024-01-21 DIAGNOSIS — I739 Peripheral vascular disease, unspecified: Secondary | ICD-10-CM | POA: Insufficient documentation

## 2024-01-21 DIAGNOSIS — E785 Hyperlipidemia, unspecified: Secondary | ICD-10-CM

## 2024-01-21 DIAGNOSIS — Z0000 Encounter for general adult medical examination without abnormal findings: Secondary | ICD-10-CM

## 2024-01-21 DIAGNOSIS — E113293 Type 2 diabetes mellitus with mild nonproliferative diabetic retinopathy without macular edema, bilateral: Secondary | ICD-10-CM | POA: Diagnosis not present

## 2024-01-21 DIAGNOSIS — I480 Paroxysmal atrial fibrillation: Secondary | ICD-10-CM | POA: Diagnosis not present

## 2024-01-21 DIAGNOSIS — E538 Deficiency of other specified B group vitamins: Secondary | ICD-10-CM

## 2024-01-21 MED ORDER — CYANOCOBALAMIN 1000 MCG/ML IJ SOLN: 1000.0000 ug | INTRAMUSCULAR | 0 refills | Status: AC

## 2024-01-21 NOTE — Patient Instructions (Addendum)
 Thank you for coming to the office today.  Vitamin B12 mild low, start B12 injections weekly x 4 weeks, start on Jan 5th, Monday, weekly  After the 4 weeks, we will send a new order for monthly injection, pick up the vial bring here for the shot.  If doing well and want to continue, call or message us  and we can change your orders then to monthly for 3-4 months.  LDL cholesterol was not resulted on the labs, due to Triglyceride, we will check it a different way tomorrow 12/18  Please schedule a Follow-up Appointment to: Return in about 4 months (around 05/21/2024) for 4 month non fasting lab  then 1 week later Follow-up DM, B12, updates.  If you have any other questions or concerns, please feel free to call the office or send a message through MyChart. You may also schedule an earlier appointment if necessary.  Additionally, you may be receiving a survey about your experience at our office within a few days to 1 week by e-mail or mail. We value your feedback.  Marsa Officer, DO [facility], [place]

## 2024-01-21 NOTE — Progress Notes (Addendum)
 Subjective:    Patient ID: Kevin Perry, male    DOB: 03-11-1950, 73 y.o.   MRN: [id number]  Kevin Perry is a 73 y.o. male presenting on 01/21/2024 for Annual Exam   HPI  Discussed the use of AI scribe software for clinical note transcription with the patient, who gave verbal consent to proceed.  History of Present Illness   Kevin Perry is a 73 year old male with hyperlipidemia who presents for an annual physical exam and review of blood work results.  Vitamin b12 deficiency - Vitamin B12 levels at low end of normal - Low energy attributed to B12 deficiency - No prior B12 injections  Vitamin d  deficiency - Vitamin D  level improved from 17 to 29 over the past year - Continues vitamin D  supplementation  CAD s/p CABGx4 Atrial fibrillation status post-cabg Hyperlipidemia - Developed atrial fibrillation following coronary artery bypass grafting - Currently on Eliquis with good tolerance - On Repatha  - Longstanding hypertriglyceridemia with recent lipid panel unable to provide LDL cholesterol value due to elevated triglycerides - Interest in effectiveness of Repatha  for cholesterol management   CHRONIC DM, Type 2: A1c to 6.5, controlled - Improvement attributed to consistent use of current medication regimen, including Trulicity  3 mg - No major changes in lifestyle, diet, or other medications On [employer] PAP program for Trulicity  3mg  weekly Meds: Trulicity  3mg  weekly, Metformin  2AM and 1 PM, Jardiance 25mg  daily Off Glipizide  Reports good compliance. Tolerating well w/o side-effects Currently on ACEi Lifestyle: - Diet (trying to improve diet reduce carb intake) - Exercise (walking limited now) Denies hypoglycemia, polyuria, visual changes, numbness or tingling.   CHRONIC HTN: Reports continues on meds doing well Current Meds - Amlodipine  10mg  daily, Lisinopril  20mg  TWICE A DAY, Metoprolol  25mg  TWICE A DAY    Reports good compliance, took meds today.  Tolerating well, w/o complaints. Denies CP, dyspnea, HA, edema, dizziness / lightheadedness   Vitamin D  Improved from last year. Mild low at 29, continue supplement  Health Maintenance: PSA 0.65 negative     01/21/2024    6:57 PM 10/13/2023    9:38 AM 08/04/2023    9:42 AM  Depression screen PHQ 2/9  Decreased Interest 0 0 0  Down, Depressed, Hopeless 0 0 0  PHQ - 2 Score 0 0 0  Altered sleeping 0 0 0  Tired, decreased energy 0 0 0  Change in appetite 0 0 0  Feeling bad or failure about yourself  0 0 0  Trouble concentrating 0 0 0  Moving slowly or fidgety/restless 0 0 0  Suicidal thoughts 0 0 0  PHQ-9 Score 0 0  0   Difficult doing work/chores Not difficult at all Not difficult at all      Data saved with a previous flowsheet row definition       10/13/2023    9:38 AM 08/04/2023    9:42 AM 07/03/2023   11:29 AM 06/04/2023   10:03 AM  GAD 7 : Generalized Anxiety Score  Nervous, Anxious, on Edge 0 0 0 0  Control/stop worrying 0 0 0 0  Worry too much - different things 0 0 0 0  Trouble relaxing 0 0 0 0  Restless 0 0 0 0  Easily annoyed or irritable 0 0 0 0  Afraid - awful might happen 0 0 0 0  Total GAD 7 Score 0 0 0 0  Anxiety Difficulty Not difficult at all  Past Medical History:  Diagnosis Date   Anxiety    Arthritis    Cancer (HCC)    skin: head   Coronary artery disease    Diabetes mellitus without complication (HCC)    Dyspnea    History of kidney stones    Hypertension    Hypertensive retinopathy    Myocardial infarction [facility])    Pneumonia    Postoperative nausea 11/27/2019   Past Surgical History:  Procedure Laterality Date   BIOPSY  08/27/2022   Procedure: BIOPSY;  Surgeon: Maryruth Ole DASEN, MD;  Location: [facility] ENDOSCOPY;  Service: Endoscopy;;   CATARACT EXTRACTION     CHOLECYSTECTOMY     COLONOSCOPY WITH PROPOFOL  N/A 08/27/2022   Procedure: COLONOSCOPY WITH PROPOFOL ;  Surgeon: Maryruth Ole DASEN, MD;  Location: [facility] ENDOSCOPY;   Service: Endoscopy;  Laterality: N/A;   CORONARY ANGIOPLASTY WITH STENT PLACEMENT N/A 02/04/2006   ESOPHAGOGASTRODUODENOSCOPY (EGD) WITH PROPOFOL  N/A 08/27/2022   Procedure: ESOPHAGOGASTRODUODENOSCOPY (EGD) WITH PROPOFOL ;  Surgeon: Maryruth Ole DASEN, MD;  Location: [facility] ENDOSCOPY;  Service: Endoscopy;  Laterality: N/A;   EYE SURGERY     FOOT SURGERY Left    DOS 8.1.14 HALLIX IPJ FUSION, SECOND MET OSTEOTOMY W/SCREW, HAM TOE REPAIR 2,,4 , PARTIAL AMP 3RD DIGIT    GALLBLADDER SURGERY     HEMOSTASIS CLIP PLACEMENT  08/27/2022   Procedure: HEMOSTASIS CLIP PLACEMENT;  Surgeon: Maryruth Ole DASEN, MD;  Location: [facility] ENDOS[facility];  Service: Endoscopy;;   IR URETERAL STENT RIGHT NEW ACCESS W/O SEP NEPHROSTOMY CATH  09/01/2023   KNEE SURERY Right    LEFT HEART CATH AND CORONARY ANGIOGRAPHY Left 11/17/2019   Procedure: LEFT HEART CATH AND CORONARY ANGIOGRAPHY;  Surgeon: Bosie Vinie LABOR, MD;  Location: [facility];  Service: Cardiovascular;  Laterality: Left;   MYRINGOTOMY WITH TUBE PLACEMENT Right 10/25/2021   Procedure: MYRINGOTOMY WITH BUTTERFLY TUBE PLACEMENT;  Surgeon: Edda Mt, MD;  Location[facility];  Service: ENT;  Laterality: Right;   NEPHROLITHOTOMY Right 09/01/2023   Procedure: NEPHROLITHOTOMY PERCUTANEOUS;  Surgeon: Carolee Sherwood JONETTA Kennieth, MD;  Location: [facility];  Service: Urology;  Laterality: Right;   PENILE PROSTHESIS IMPLANT N/A 01/13/2024   Procedure: INSERTION, PENILE PROSTHESIS;  Surgeon: Lovie Arlyss CROME, MD;  Location: [facility];  Service: Urology;  Laterality: N/A;   POLYPECTOMY  08/27/2022   Procedure: POLYPECTOMY;  Surgeon: Maryruth Ole DASEN, MD;  Location: [facility];  Service: Endoscopy;;   SUBMUCOSAL LIFTING INJECTION  08/27/2022   Procedure: SUBMUCOSAL LIFTING INJECTION;  Surgeon: Maryruth Ole DASEN, MD;  Location: [facility];  Service: Endoscopy;;  ELEVIEW ASCENDING COLON POLYP   TOTAL KNEE ARTHROPLASTY Right 08/30/2014   Procedure: TOTAL KNEE  ARTHROPLASTY;  Surgeon: Ozell Flake, MD;  Location: [facility];  Service: Orthopedics;  Laterality: Right;   Social History   Socioeconomic History   Marital status: Married    Spouse name: Not on file   Number of children: Not on file   Years of education: Not on file   Highest education level: Bachelor's degree (e.g., BA, AB, BS)  Occupational History   Not on file  Tobacco Use   Smoking status: Former    Current packs/day: 0.00    Types: Cigarettes    Quit date: 08/16/2004    Years since quitting: 19.4   Smokeless tobacco: Former    Quit date: 2006   Tobacco comments:    quit 20 plus years  Vaping Use   Vaping status: Never Used  Substance and Sexual Activity  Alcohol use: Not Currently   Drug use: No   Sexual activity: Not Currently  Other Topics Concern   Not on file  Social History Narrative   Not on file   Social Drivers of Health   Tobacco Use: Medium Risk (01/21/2024)   Patient History    Smoking Tobacco Use: Former    Smokeless Tobacco Use: Former    Passive Exposure: Not on Actuary Strain: Low Risk (01/17/2024)   Overall Financial Resource Strain (CARDIA)    Difficulty of Paying Living Expenses: Not very hard  Food Insecurity: No Food Insecurity (01/17/2024)   Epic    Worried About Programme Researcher, Broadcasting/film/video in the Last Year: Never true    Ran Out of Food in the Last Year: Never true  Transportation Needs: No Transportation Needs (01/17/2024)   Epic    Lack of Transportation (Medical): No    Lack of Transportation (Non-Medical): No  Physical Activity: Sufficiently Active (01/17/2024)   Exercise Vital Sign    Days of Exercise per Week: 3 days    Minutes of Exercise per Session: 80 min  Stress: No Stress Concern Present (01/17/2024)   [employer] of Occupational Health - Occupational Stress Questionnaire    Feeling of Stress: Not at all  Social Connections: Socially Integrated (01/17/2024)   Social Connection and Isolation Panel     Frequency of Communication with Friends and Family: Three times a week    Frequency of Social Gatherings with Friends and Family: Once a week    Attends Religious Services: More than 4 times per year    Active Member of Clubs or Organizations: Yes    Attends Banker Meetings: More than 4 times per year    Marital Status: Married  Catering Manager Violence: Not At Risk (09/01/2023)   Epic    Fear of Current or Ex-Partner: No    Emotionally Abused: No    Physically Abused: No    Sexually Abused: No  Depression (PHQ2-9): Low Risk (01/21/2024)   Depression (PHQ2-9)    PHQ-2 Score: 0  Alcohol Screen: Low Risk (04/04/2023)   Alcohol Screen    Last Alcohol Screening Score (AUDIT): 0  Housing: Unknown (01/17/2024)   Epic    Unable to Pay for Housing in the Last Year: No    Number of Times Moved in the Last Year: Not on file    Homeless in the Last Year: No  Utilities: Not At Risk (09/01/2023)   Epic    Threatened with loss of utilities: No  Health Literacy: Adequate Health Literacy (04/04/2023)   B1300 Health Literacy    Frequency of need for help with medical instructions: Never   Family History  Problem Relation Age of Onset   Heart attack Mother    Diabetes Father    Hypertension Father    Heart attack Sister    Current Outpatient Medications on File Prior to Visit  Medication Sig   acetaminophen  (TYLENOL ) 500 MG tablet Take 2 tablets (1,000 mg total) by mouth every 6 (six) hours.   amLODipine  (NORVASC ) 10 MG tablet Take 1 tablet (10 mg total) by mouth daily.   apixaban (ELIQUIS) 5 MG TABS tablet Take 5 mg by mouth 2 (two) times daily.   aspirin  EC 81 MG tablet Take 81 mg by mouth daily. Swallow whole.   empagliflozin (JARDIANCE) 25 MG TABS tablet Take 25 mg by mouth daily.   escitalopram  (LEXAPRO ) 20 MG tablet Take 1 tablet (20 mg total)  by mouth daily.   folic acid  (FOLVITE ) 1 MG tablet Take 1 mg by mouth See admin instructions. Take 1 mg daily except skip dose on  Wednesdays (methotrexate day)   furosemide  (LASIX ) 40 MG tablet TAKE 1 TABLET BY MOUTH ONCE DAILY   gabapentin  (NEURONTIN ) 100 MG capsule Take 1 capsule (100 mg total) by mouth 2 (two) times daily.   lisinopril  (ZESTRIL ) 20 MG tablet Take 1 tablet by mouth 2 (two) times daily.   loratadine  (CLARITIN ) 10 MG tablet Take 10 mg by mouth every morning.   metFORMIN  (GLUCOPHAGE ) 500 MG tablet TAKE 2 TABLETS BY MOUTH ONCE EVERY MORNING WITH BREAKFAST AND 1 TABLET ONCEEVERY EVENING WITH DINNER   methotrexate (RHEUMATREX) 2.5 MG tablet Take 10 mg by mouth once a week. On Wednesdays   metoprolol  tartrate (LOPRESSOR ) 25 MG tablet Take 1 tablet (25 mg total) by mouth 2 (two) times daily.   nitroGLYCERIN  (NITROSTAT ) 0.4 MG SL tablet Place 0.4 mg under the tongue every 5 (five) minutes as needed for chest pain.   oxyCODONE  (ROXICODONE ) 5 MG immediate release tablet Take 1 tablet (5 mg total) by mouth every 6 (six) hours as needed.   REPATHA  SURECLICK 140 MG/ML SOAJ Inject 140 mg as directed every 14 (fourteen) days.   TRULICITY  3 MG/0.5ML SOPN INJECT 3 MG (0.5 ML) UNDER THE SKIN ONCE A WEEK   No current facility-administered medications on file prior to visit.    Review of Systems  Constitutional:  Negative for activity change, appetite change, chills, diaphoresis, fatigue and fever.  HENT:  Negative for congestion and hearing loss.   Eyes:  Negative for visual disturbance.  Respiratory:  Negative for cough, chest tightness, shortness of breath and wheezing.   Cardiovascular:  Negative for chest pain, palpitations and leg swelling.  Gastrointestinal:  Negative for abdominal pain, constipation, diarrhea, nausea and vomiting.  Genitourinary:  Negative for dysuria, frequency and hematuria.  Musculoskeletal:  Negative for arthralgias and neck pain.  Skin:  Negative for rash.  Neurological:  Negative for dizziness, weakness, light-headedness, numbness and headaches.  Hematological:  Negative for adenopathy.   Psychiatric/Behavioral:  Negative for behavioral problems, dysphoric mood and sleep disturbance.    Per HPI unless specifically indicated above     Objective:    BP 130/70 (BP Location: Right Arm, Patient Position: Sitting, Cuff Size: Large)   Pulse 66   Ht 6' 2 (1.88 m)   Wt 249 lb 2 oz (113 kg)   SpO2 98%   BMI 31.99 kg/m   Wt Readings from Last 3 Encounters:  01/21/24 249 lb 2 oz (113 kg)  01/13/24 255 lb (115.7 kg)  01/05/24 247 lb (112 kg)    Physical Exam Vitals and nursing note reviewed.  Constitutional:      General: He is not in acute distress.    Appearance: He is well-developed. He is not diaphoretic.     Comments: Well-appearing, comfortable, cooperative  HENT:     Head: Normocephalic and atraumatic.  Eyes:     General:        Right eye: No discharge.        Left eye: No discharge.     Conjunctiva/sclera: Conjunctivae normal.     Pupils: Pupils are equal, round, and reactive to light.  Neck:     Thyroid : No thyromegaly.  Cardiovascular:     Rate and Rhythm: Normal rate and regular rhythm.     Pulses: Normal pulses.     Heart sounds: Normal heart sounds.  No murmur heard. Pulmonary:     Effort: Pulmonary effort is normal. No respiratory distress.     Breath sounds: Normal breath sounds. No wheezing or rales.  Abdominal:     General: Bowel sounds are normal. There is no distension.     Palpations: Abdomen is soft. There is no mass.     Tenderness: There is no abdominal tenderness.  Musculoskeletal:        General: No tenderness. Normal range of motion.     Cervical back: Normal range of motion and neck supple.     Comments: Upper / Lower Extremities: - Normal muscle tone, strength bilateral upper extremities 5/5, lower extremities 5/5  Lymphadenopathy:     Cervical: No cervical adenopathy.  Skin:    General: Skin is warm and dry.     Findings: No erythema or rash.  Neurological:     Mental Status: He is alert and oriented to person, place, and  time.     Comments: Distal sensation intact to light touch all extremities  Psychiatric:        Mood and Affect: Mood normal.        Behavior: Behavior normal.        Thought Content: Thought content normal.     Comments: Well groomed, good eye contact, normal speech and thoughts     Results for orders placed or performed during the hospital encounter of 01/13/24  Glucose, capillary   Collection Time: 01/13/24  9:02 AM  Result Value Ref Range   Glucose-Capillary 140 (H) 70 - 99 mg/dL   Comment 1 Notify RN   Glucose, capillary   Collection Time: 01/13/24  2:07 PM  Result Value Ref Range   Glucose-Capillary 184 (H) 70 - 99 mg/dL      Assessment & Plan:   Problem List Items Addressed This Visit     Coronary artery disease involving native coronary artery of native heart without angina pectoris   Relevant Medications   apixaban (ELIQUIS) 5 MG TABS tablet   Essential hypertension   Relevant Medications   apixaban (ELIQUIS) 5 MG TABS tablet   History of complete ray amputation of third toe of right foot   Hyperlipidemia associated with type 2 diabetes mellitus (HCC)   Relevant Medications   apixaban (ELIQUIS) 5 MG TABS tablet   Other Relevant Orders   Direct LDL   Moderate non-proliferative diabetic retinopathy (HCC)   Paroxysmal atrial fibrillation (HCC)   Relevant Medications   apixaban (ELIQUIS) 5 MG TABS tablet   S/P CABG x 4   Type 2 diabetes mellitus with other specified complication (HCC)   Other Visit Diagnoses       Annual physical exam    -  Primary     Vitamin B12 deficiency       Relevant Medications   cyanocobalamin  (VITAMIN B12) 1000 MCG/ML injection     Statin myopathy [G72.0, T46.6X5A]         Long-term current use of injectable noninsulin antidiabetic medication            Updated Health Maintenance information Reviewed recent lab results with patient Encouraged improvement to lifestyle with diet and exercise Goal of weight loss   Vitamin B12  deficiency Vitamin B12 levels low 276 but within normal range. Symptoms include low energy. - Ordered B12 injections weekly for four weeks starting January 5th. - Schedule follow-up to assess response to B12 injections. Likely add monthly x 3-4 months after that - Consider monthly B12 injections  if improvement noted.  Type 2 diabetes mellitus with complication retinopathy (NPDR) Well-controlled with A1c of 6.5%. Followed by Ophthalmology - Continue Trulicity , metformin , and Jardiance.  Hyperlipidemia / Hypertriglyceridemia Drug induced myopathy on Statin. Triglycerides elevated at 500 mg/dL. Repatha  ineffective for triglycerides. - Ordered direct LDL test for December 18th, 2025. For further eval of Repatha  - Continue Repatha  for LDL and CAD management  Paroxysmal atrial fibrillation Managed with Eliquis anticoagulation and rate control beta blocker metoprolol  - Continue Eliquis.  Coronary artery disease, s/p CABG x 4 Followed by Cardiology Managed with current medications. - Continue current medications ASA 81mg , Eliquis, Jardiance, ACEi, Metoprolol , Repatha   Essential hypertension Blood pressure well-controlled. Continue current therapy   General Health Maintenance Routine health maintenance up to date. PSA normal. Vitamin D  improved. - Continue vitamin D  supplementation. - Continue routine health screenings.        Orders Placed This Encounter  Procedures   Direct LDL    Meds ordered this encounter  Medications   cyanocobalamin  (VITAMIN B12) 1000 MCG/ML injection    Sig: Inject 1 mL (1,000 mcg total) into the muscle once a week. For 4 weeks    Dispense:  4 mL    Refill:  0     Follow up plan: Return in about 4 months (around 05/21/2024) for 4 month non fasting lab  then 1 week later Follow-up DM, B12, updates.  Marsa Officer, DO [facility] 01/21/2024, 2:14 PM

## 2024-01-22 ENCOUNTER — Other Ambulatory Visit

## 2024-01-23 LAB — LDL CHOLESTEROL, DIRECT: Direct LDL: 20 mg/dL

## 2024-01-27 ENCOUNTER — Ambulatory Visit: Payer: Self-pay | Admitting: Family Medicine

## 2024-02-02 ENCOUNTER — Other Ambulatory Visit: Admitting: Pharmacist

## 2024-02-02 DIAGNOSIS — Z7984 Long term (current) use of oral hypoglycemic drugs: Secondary | ICD-10-CM

## 2024-02-02 DIAGNOSIS — E113293 Type 2 diabetes mellitus with mild nonproliferative diabetic retinopathy without macular edema, bilateral: Secondary | ICD-10-CM

## 2024-02-02 DIAGNOSIS — I10 Essential (primary) hypertension: Secondary | ICD-10-CM

## 2024-02-02 DIAGNOSIS — Z794 Long term (current) use of insulin: Secondary | ICD-10-CM

## 2024-02-02 DIAGNOSIS — I2510 Atherosclerotic heart disease of native coronary artery without angina pectoris: Secondary | ICD-10-CM

## 2024-02-02 DIAGNOSIS — I480 Paroxysmal atrial fibrillation: Secondary | ICD-10-CM

## 2024-02-02 NOTE — Progress Notes (Unsigned)
 "  02/02/2024 Name: Kevin Perry MRN: [id number] DOB: 29-Dec-1950  Chief Complaint  Patient presents with   Medication Management   Medication Assistance    Kevin Perry is a 73 y.o. year old male who presented for a telephone visit.   They were referred to the pharmacist by their PCP for assistance in managing diabetes, hypertension, hyperlipidemia, and medication access.      Subjective:   Care Team: Primary Care Provider: Edman Kevin PARAS, DO; Next Scheduled Visit: 05/20/2024 Cardiologist: Kevin Perry, Sabina, DO; Next Scheduled Visit: 06/02/2024 Appointment with Kevin Lamar Kent, MD: 02/24/2024 Gastroenterology: Kevin Ole Revel, MD  Medication Access/Adherence  Current Pharmacy:  [facility], [place] - [address] [place] [address] Phone: [phone] Fax: [phone]   Patient reports affordability concerns with their medications: No Patient reports access/transportation concerns to their pharmacy: No  Patient reports adherence concerns with their medications:  No       Diabetes:   Current medications:  Metformin  IR 500mg  x 2 in AM / x 1 in PM Trulicity  3 mg weekly Jardiance 25 mg daily   Medications tried in the past: glipizide      Morning fasting glucose today: 127    Patient denies hypoglycemic s/sx including dizziness, shakiness, sweating.     Current physical activity: restarting walking. Plans to walk ~1 hour x 5 days/week  Current medication access support:  - Enrolled for Trulicity  from [facility] Patient Assistance Program through 02/03/2025 - Collaborating with CPhT and PCP to assist patient with re-enrollment in Jardiance from [facility] Patient Assistance Program for 2026  Note application submitted to [facility] via fax on 12/08/2023   Atrial Fibrillation/Hypertension:  Patient [facility] by [facility]. Note at latest Office Visit with Dr. Dewane on 12/10, patient found to have  new onset Afib (note previous developed post-CABG, then resolved). Provider advised patient: - Discontinued aspirin . - Initiated Eliquis for anticoagulation   Current medications: - Rate Control: Metoprolol  tartrate 25 mg twice daily - Anticoagulation Regimen: Eliquis 5 mg twice daily - Amlodipine  10mg  daily - Furosemide  40 mg once daily - Lisinopril  20 mg twice daily  Patient has an automated, upper arm home BP cuff   Recalls home blood pressure reading today: 118/80, HR 72  Current physical activity: restarting walking. Plans to walk ~1 hour x 5 days/week    Hyperlipidemia/ASCVD Risk Reduction   Current lipid lowering medications: Repatha  Sureclick 140 mg every 14 days                                                  Note Repatha  PA approved through 01/2024    Medications tried in the past: atorvastatin  (myopathy)   ASCVD History: CAD with prior CABG and PCI    Current physical activity: restarting walking. Plans to walk ~1 hour x 5 days/week  Current Medication Access Support: [employer] Hypercholesteremia- Medicare Access grant - patient approved through 01/23/2025     Objective:  Lab Results  Component Value Date   HGBA1C 6.5 (H) 01/12/2024    Lab Results  Component Value Date   CREATININE 1.05 01/12/2024   BUN 15 01/12/2024   NA 137 01/12/2024   K 4.7 01/12/2024   CL 98 01/12/2024   CO2 31 01/12/2024    Lab Results  Component Value Date  CHOL 94 01/12/2024   HDL 35 (L) 01/12/2024   LDLCALC  01/12/2024     Comment:     . LDL cholesterol not calculated. Triglyceride levels greater than 400 mg/dL invalidate calculated LDL results. . Reference range: <100 . Desirable range <100 mg/dL for primary prevention;   <70 mg/dL for patients with CHD or diabetic patients  with > or = 2 CHD risk factors. SABRA LDL-C is now calculated using the Martin-Hopkins  calculation, which is a validated novel method providing  better accuracy than the  Friedewald equation in the  estimation of LDL-C.  [name] et al. SANDREA. 7986;689(80): 2061-2068  (http://education.QuestDiagnostics.com/faq/FAQ164)    LDLDIRECT 20 01/22/2024   TRIG 525 (H) 01/12/2024   CHOLHDL 2.7 01/12/2024   BP Readings from Last 3 Encounters:  01/21/24 130/70  01/13/24 138/78  01/05/24 133/74   Pulse Readings from Last 3 Encounters:  01/21/24 66  01/13/24 (!) 56  01/05/24 62     Medications Reviewed Today     Reviewed by Kevin Perry, Kevin (Pharmacist) on 02/02/24 at 1151  Med List Status: <None>   Medication Order Taking? Sig Documenting Provider Last Dose Status Informant  acetaminophen  (TYLENOL ) 500 MG tablet [id number]  Take 2 tablets (1,000 mg total) by mouth every 6 (six) hours. Kevin Arlyss CROME, MD  Active   amLODipine  (NORVASC ) 10 MG tablet [id number] Yes Take 1 tablet (10 mg total) by mouth daily. Kevin Kevin PARAS, DO  Active Self, Pharmacy Records, Multiple Informants  apixaban (ELIQUIS) 5 MG TABS tablet [id number] Yes Take 5 mg by mouth 2 (two) times daily. [provider]  Active   aspirin  EC 81 MG tablet [id number]  Take 81 mg by mouth daily. Swallow whole. [provider]  Active Self, Pharmacy Records, Multiple Informants  cyanocobalamin  (VITAMIN B12) 1000 MCG/ML injection [id number]  Inject 1 mL (1,000 mcg total) into the muscle once a week. For 4 weeks Kevin Kevin PARAS, DO  Active   empagliflozin (JARDIANCE) 25 MG TABS tablet [id number] Yes Take 25 mg by mouth daily. [provider]  Active Self, Pharmacy Records, Multiple Informants  escitalopram  (LEXAPRO ) 20 MG tablet [id number]  Take 1 tablet (20 mg total) by mouth daily. Kevin Kevin PARAS, DO  Active Self, Pharmacy Records, Multiple Informants  folic acid  (FOLVITE ) 1 MG tablet [id number]  Take 1 mg by mouth See admin instructions. Take 1 mg daily except skip dose on Wednesdays (methotrexate day) [provider]  Active Self,  Pharmacy Records, Multiple Informants  furosemide  (LASIX ) 40 MG tablet [id number]  TAKE 1 TABLET BY MOUTH ONCE DAILY Kevin Kevin PARAS, DO  Active Self, Pharmacy Records, Multiple Informants  gabapentin  (NEURONTIN ) 100 MG capsule [id number]  Take 1 capsule (100 mg total) by mouth 2 (two) times daily. Kevin Kevin PARAS, DO  Active Self, Pharmacy Records, Multiple Informants  lisinopril  (ZESTRIL ) 20 MG tablet [id number] Yes Take 1 tablet by mouth 2 (two) times daily. [provider]  Active Self, Pharmacy Records, Multiple Informants  loratadine  (CLARITIN ) 10 MG tablet [id number]  Take 10 mg by mouth every morning. [provider]  Active Self, Pharmacy Records, Multiple Informants  metFORMIN  (GLUCOPHAGE ) 500 MG tablet [id number] Yes TAKE 2 TABLETS BY MOUTH ONCE EVERY MORNING WITH BREAKFAST AND 1 TABLET ONCEEVERY EVENING WITH DINNER Kevin Perry, Kevin PARAS, DO  Active Self, Pharmacy Records, Multiple Informants  methotrexate (RHEUMATREX) 2.5 MG tablet [id number]  Take 10 mg by mouth once a week. On Wednesdays [provider]  Active Self, Pharmacy  Records, Multiple Informants  metoprolol  tartrate (LOPRESSOR ) 25 MG tablet [id number]  Take 1 tablet (25 mg total) by mouth 2 (two) times daily. Kevin Kevin PARAS, DO  Active Self, Pharmacy Records, Multiple Informants  nitroGLYCERIN  (NITROSTAT ) 0.4 MG SL tablet [id number]  Place 0.4 mg under the tongue every 5 (five) minutes as needed for chest pain. [provider]  Active Self, Pharmacy Records, Multiple Informants           Med Note CHRISTIE ALYSON Kitchens Aug 25, 2023 11:36 AM)    oxyCODONE  (ROXICODONE ) 5 MG immediate release tablet [id number]  Take 1 tablet (5 mg total) by mouth every 6 (six) hours as needed. Kevin Arlyss CROME, MD  Active   REPATHA  SURECLICK 140 MG/ML EMMANUEL [id number] Yes Inject 140 mg as directed every 14 (fourteen) days. Kevin Kevin PARAS, DO  Active Self, Pharmacy Records, Multiple  Informants           Med Note CHRISTIE ALYSON Kitchens Aug 25, 2023 11:35 AM) Monday  TRULICITY  3 MG/0.5ML SOPN [id number] Yes INJECT 3 MG (0.5 ML) UNDER THE SKIN ONCE A WEEK Kevin Perry, Kevin PARAS, DO  Active Self, Pharmacy Records, Multiple Informants           Med Note CHRISTIE ALYSON Kitchens Aug 25, 2023 11:35 AM) Monday              Assessment/Plan:   Diabetes: - Counseled on importance of having well-balanced meals, while controlling carbohydrate portion sizes - Recommend to continue to check glucose and keep a log of the results - Patient to contact [facility] patient assistance program as needed for refills of Trulicity  and [employer] Cares as needed for refills of Jardiance - Collaborating with CPhT and PCP to assist patient with re-enrollment in [place] from [facility] Patient Assistance Program             Send message to CPhT [place]   Hypertension: - Have reviewed appropriate blood pressure monitoring technique and reviewed goal blood pressure.  - Recommend patient to continue to monitor home BP, keep log of results including HR readings and bring this record to medical appointments      Hyperlipidemia/ASCVD Risk Reduction: - Patient to follow up with [facility] as needed for refills of Repatha  Note have provided grant billing information to patient's pharmacy Patient aware to provide to pharmacy and advise pharmacy to bill remaining balance of cost for Repatha  prescription to [employer] grant ONLY AFTER billed to Medicare  - Have encouraged patient to reduce fast food consumption - Will collaborate with PCP to recommend start of Vascepa for patient for Cardiovascular risk reduction with hypertriglyceridemia  Will start prior authorization for Vascepa via CoverMyMeds on behalf of patient   Follow Up Plan: Clinical Pharmacist will follow up with patient by telephone on 04/26/2024 at 11:30 AM    Sharyle Sia, PharmD, JAQUELINE, CPP Clinical Pharmacist [facility] [phone]   "

## 2024-02-03 ENCOUNTER — Other Ambulatory Visit: Payer: Self-pay | Admitting: Family Medicine

## 2024-02-03 DIAGNOSIS — E1169 Type 2 diabetes mellitus with other specified complication: Secondary | ICD-10-CM

## 2024-02-03 DIAGNOSIS — I2510 Atherosclerotic heart disease of native coronary artery without angina pectoris: Secondary | ICD-10-CM

## 2024-02-03 MED ORDER — VASCEPA 1 G PO CAPS: 2.0000 g | ORAL_CAPSULE | Freq: Two times a day (BID) | ORAL | 5 refills | Status: DC

## 2024-02-03 NOTE — Telephone Encounter (Signed)
 PAP: Patient assistance application for Jardiance  has been approved by PAP Companies: [employer] from 02/05/2024 to 02/03/2025. Medication should be delivered to PAP Delivery: Home. For further shipping updates, please contact [employer] (BI Cares) at [phone]. Patient ID is: not provided

## 2024-02-04 ENCOUNTER — Telehealth: Payer: Self-pay

## 2024-02-04 ENCOUNTER — Other Ambulatory Visit: Payer: Self-pay | Admitting: Family Medicine

## 2024-02-04 ENCOUNTER — Other Ambulatory Visit: Payer: Self-pay | Admitting: Pharmacist

## 2024-02-04 ENCOUNTER — Other Ambulatory Visit (HOSPITAL_COMMUNITY): Payer: Self-pay

## 2024-02-04 DIAGNOSIS — I10 Essential (primary) hypertension: Secondary | ICD-10-CM

## 2024-02-04 DIAGNOSIS — I2510 Atherosclerotic heart disease of native coronary artery without angina pectoris: Secondary | ICD-10-CM

## 2024-02-04 DIAGNOSIS — E1169 Type 2 diabetes mellitus with other specified complication: Secondary | ICD-10-CM

## 2024-02-04 DIAGNOSIS — F331 Major depressive disorder, recurrent, moderate: Secondary | ICD-10-CM

## 2024-02-04 MED ORDER — ICOSAPENT ETHYL 1 G PO CAPS: 2.0000 g | ORAL_CAPSULE | Freq: Two times a day (BID) | ORAL | 5 refills | Status: DC

## 2024-02-04 MED ORDER — ICOSAPENT ETHYL 1 G PO CAPS: 2.0000 g | ORAL_CAPSULE | Freq: Two times a day (BID) | ORAL | 0 refills | Status: AC

## 2024-02-04 NOTE — Addendum Note (Signed)
 Addended by: ZELIA GAUZE D on: 02/04/2024 03:49 PM   Modules accepted: Orders

## 2024-02-04 NOTE — Telephone Encounter (Signed)
 Pharmacy Patient Advocate Encounter   Received notification from Onbase that prior authorization for Vascepa 1 gm caps is required/requested.   Insurance verification completed.   The patient is insured through [facility] ADVANTAGE/RX ADVANCE.   Per test claim:

## 2024-02-04 NOTE — Progress Notes (Signed)
" ° °  02/04/2024  Patient ID: Kevin Perry, male   DOB: 10/12/1950, 73 y.o.   MRN: [id number]  Find that patient's Medicare plan covers the generic, icosapent Ethyl, rather than brand name Vascepa. New order for generic form sent to pharmacy for patient today.  Outreach to [employer] today and speak with Morris. Confirms received icosapent ethyl for patient and that this is going through patient's [employer] Medicare plan for no charge (appears that patient has met his maximum out of pocket prescription cost for 2025).  Provide Mandy with billing information for patient's [employer] Hypercholesteremia- Medicare Access grant for 2026 - Reminded pharmacy to bill remaining balance of cost for Repatha  and icosapent ethyl prescription to [employer] grant ONLY AFTER billed to Medicare   Follow up with patient to provide an update and counseling on icosapent ethyl prescription to take 2 grams twice daily with meals.  - Remind patient of importance of continuing to monitor for any signs of bleeding bruising while on Eliquis  Review counseling on importance of lifestyle changes, dietary and physical activity, to also aid with triglyceride control  Follow Up Plan: Clinical Pharmacist will follow up with patient by telephone on 04/26/2024 at 11:30 AM    Sharyle Sia, PharmD, JAQUELINE, CPP Clinical Pharmacist [facility] [phone] "

## 2024-02-04 NOTE — Patient Instructions (Signed)
 Goals Addressed             This Visit's Progress    Pharmacy Goals       Our goal A1c is less than 7%. This corresponds with fasting sugars less than 130 and 2 hour after meal sugars less than 180. Please check your blood sugar and keep log of the results   Check your blood pressure twice weekly, and any time you have concerning symptoms like headache, chest pain, dizziness, shortness of breath, or vision changes.   Our goal is less than 130/80.  To appropriately check your blood pressure, make sure you do the following:  1) Avoid caffeine , exercise, or tobacco products for 30 minutes before checking. Empty your bladder. 2) Sit with your back supported in a flat-backed chair. Rest your arm on something flat (arm of the chair, table, etc). 3) Sit still with your feet flat on the floor, resting, for at least 5 minutes.  4) Check your blood pressure. Take 1-2 readings.  5) Write down these readings and bring with you to any provider appointments.  Bring your home blood pressure machine with you to a provider's office for accuracy comparison at least once a year.   Make sure you take your blood pressure medications before you come to any office visit, even if you were asked to fast for labs.   Feel free to call me with any questions or concerns. I look forward to our next call!  Sharyle Sia, PharmD, [facility] Clinical Pharmacist [facility] [phone]

## 2024-02-04 NOTE — Telephone Encounter (Signed)
 Understood. Patient's Medicare plan covers the generic, icosapent Ethyl, rather than brand name Vascepa. New order for generic form sent to pharmacy for patient today  Sharyle Sia, PharmD, BCACP, CPP Clinical Pharmacist [facility] [phone]

## 2024-02-04 NOTE — Telephone Encounter (Signed)
 [facility] called back to report that beyond today his insurance will no longer cover this medication. The pharmacy wants to know if a 3 month supply of this medication can be called in instead of a 1 month. Please advise.

## 2024-02-04 NOTE — Telephone Encounter (Signed)
 Done

## 2024-02-05 NOTE — Telephone Encounter (Signed)
 Requested Prescriptions  Pending Prescriptions Disp Refills   REPATHA  SURECLICK 140 MG/ML SOAJ [Pharmacy Med Name: REPATHA  SURECLICK 140 MG/ML SUBQ SO] 2 mL 5    Sig: INJECT 140MG  SUBCUTANEOUSLY EVERY 14 DAYS     Cardiovascular: PCSK9 Inhibitors Passed - 02/05/2024  1:10 PM      Passed - Valid encounter within last 12 months    Recent Outpatient Visits           2 weeks ago Annual physical exam   [place] [facility] [place], Marsa PARAS, DO   3 months ago Type 2 diabetes mellitus with other specified complication, without long-term current use of insulin  [facility])   [facility] [place], Marsa PARAS, DO   6 months ago Type 2 diabetes mellitus with other specified complication, without long-term current use of insulin  [facility])   [place] [facility] [place], Marsa PARAS, DO   7 months ago Community acquired pneumonia of right lower lobe of lung   [place] [facility] [place], Marsa PARAS, DO   8 months ago Acute non-recurrent frontal sinusitis   [facility] [place], Marsa PARAS, [place]              Passed - Lipid Panel completed within the last 12 months    Cholesterol  Date Value Ref Range Status  01/12/2024 94 <200 mg/dL Final  93/76/7985 846 0 - 200 mg/dL Final   Ldl Cholesterol, Calc  Date Value Ref Range Status  07/27/2012 77 0 - 100 mg/dL Final   LDL Cholesterol (Calc)  Date Value Ref Range Status  01/12/2024  mg/dL (calc) Final    Comment:    . LDL cholesterol not calculated. Triglyceride levels greater than 400 mg/dL invalidate calculated LDL results. . Reference range: <100 . Desirable range <100 mg/dL for primary prevention;   <70 mg/dL for patients with CHD or diabetic patients  with > or = 2 CHD risk factors. SABRA LDL-C is now calculated using the Martin-Hopkins  calculation, which is a validated novel method providing  better accuracy  than the Friedewald equation in the  estimation of LDL-C.  [name] et al. SANDREA. 7986;689(80): 2061-2068  (http://education.QuestDiagnostics.com/faq/FAQ164)    Direct LDL  Date Value Ref Range Status  01/22/2024 20 <100 mg/dL Final    Comment:    Greatly elevated Triglycerides values (>1200 mg/dL) interfere with the dLDL assay. SABRA Desirable range <100 mg/dL for primary prevention;   <70 mg/dL for patients with CHD or diabetic patients  with > or = 2 CHD risk factors. SABRA    HDL Cholesterol  Date Value Ref Range Status  07/27/2012 34 (L) 40 - 60 mg/dL Final   HDL  Date Value Ref Range Status  01/12/2024 35 (L) > OR = 40 mg/dL Final   Triglycerides  Date Value Ref Range Status  01/12/2024 525 (H) <150 mg/dL Final    Comment:    . If a non-fasting specimen was collected, consider repeat triglyceride testing on a fasting specimen if clinically indicated.  Veatrice et al. J. of Clin. Lipidol. 2015;9:129-169. . . There is increased risk of pancreatitis when the  triglyceride concentration is very high  (> or = 500 mg/dL, especially if > or = 8999 mg/dL).  Veatrice et al. J. of Clin. Lipidol. 2015;9:129-169. .   07/27/2012 212 (H) 0 - 200 mg/dL Final

## 2024-02-06 ENCOUNTER — Other Ambulatory Visit: Payer: Self-pay | Admitting: Family Medicine

## 2024-02-06 DIAGNOSIS — I2510 Atherosclerotic heart disease of native coronary artery without angina pectoris: Secondary | ICD-10-CM

## 2024-02-06 NOTE — Telephone Encounter (Signed)
 Requested Prescriptions  Pending Prescriptions Disp Refills   escitalopram  (LEXAPRO ) 20 MG tablet [Pharmacy Med Name: ESCITALOPRAM  OXALATE 20 MG TAB] 90 tablet 0    Sig: TAKE 1 TABLET BY MOUTH ONCE DAILY     Psychiatry:  Antidepressants - SSRI Passed - 02/06/2024 11:10 AM      Passed - Completed PHQ-2 or PHQ-9 in the last 360 days      Passed - Valid encounter within last 6 months    Recent Outpatient Visits           2 weeks ago Annual physical exam   [facility] [place], Marsa PARAS, DO   3 months ago Type 2 diabetes mellitus with other specified complication, without long-term current use of insulin  [facility])   [facility] [place], Marsa PARAS, DO   6 months ago Type 2 diabetes mellitus with other specified complication, without long-term current use of insulin  [facility])   [place] [facility] [place], Marsa PARAS, DO   7 months ago Community acquired pneumonia of right lower lobe of lung   [place] [facility] [place], Marsa PARAS, DO   8 months ago Acute non-recurrent frontal sinusitis   [facility] [place], Marsa PARAS, DO               amLODipine  (NORVASC ) 10 MG tablet [Pharmacy Med Name: AMLODIPINE  BESYLATE 10 MG TAB] 90 tablet 0    Sig: TAKE 1 TABLET BY MOUTH ONCE DAILY     Cardiovascular: Calcium  Channel Blockers 2 Passed - 02/06/2024 11:10 AM      Passed - Last BP in normal range    BP Readings from Last 1 Encounters:  01/21/24 130/70         Passed - Last Heart Rate in normal range    Pulse Readings from Last 1 Encounters:  01/21/24 66         Passed - Valid encounter within last 6 months    Recent Outpatient Visits           2 weeks ago Annual physical exam   [facility] [place], Marsa PARAS, DO   3 months ago Type 2 diabetes mellitus with other specified complication, without long-term current  use of insulin  [facility])   [place] [facility] [place], Marsa PARAS, DO   6 months ago Type 2 diabetes mellitus with other specified complication, without long-term current use of insulin  [facility])   [place] [facility] [place], Marsa PARAS, DO   7 months ago Community acquired pneumonia of right lower lobe of lung   [facility] [place], Marsa PARAS, DO   8 months ago Acute non-recurrent frontal sinusitis   [place] [facility] [place], [place] PARAS, [place]

## 2024-02-06 NOTE — Telephone Encounter (Signed)
 Duplicate request.  Requested Prescriptions  Pending Prescriptions Disp Refills   REPATHA  SURECLICK 140 MG/ML SOAJ [Pharmacy Med Name: REPATHA  SURECLICK 140 MG/ML SUBQ SO] 2 mL 5    Sig: INJECT 140MG  SUBCUTANEOUSLY EVERY 14 DAYS     Cardiovascular: PCSK9 Inhibitors Passed - 02/06/2024  4:19 PM      Passed - Valid encounter within last 12 months    Recent Outpatient Visits           2 weeks ago Annual physical exam   [facility] [place], Marsa PARAS, DO   3 months ago Type 2 diabetes mellitus with other specified complication, without long-term current use of insulin  [facility])   [place] [facility] [place], Marsa PARAS, DO   6 months ago Type 2 diabetes mellitus with other specified complication, without long-term current use of insulin  [facility])   [place] [facility] [place], Marsa PARAS, DO   7 months ago Community acquired pneumonia of right lower lobe of lung   [facility] [place], Marsa PARAS, DO   8 months ago Acute non-recurrent frontal sinusitis   [place] [facility] [place], [place]AS, [place]              Passed - Lipid Panel completed within the last 12 months    Cholesterol  Date Value Ref Range Status  01/12/2024 94 <200 mg/dL Final  93/76/7985 846 0 - 200 mg/dL Final   Ldl Cholesterol, Calc  Date Value Ref Range Status  07/27/2012 77 0 - 100 mg/dL Final   LDL Cholesterol (Calc)  Date Value Ref Range Status  01/12/2024  mg/dL (calc) Final    Comment:    . LDL cholesterol not calculated. Triglyceride levels greater than 400 mg/dL invalidate calculated LDL results. . Reference range: <100 . Desirable range <100 mg/dL for primary prevention;   <70 mg/dL for patients with CHD or diabetic patients  with > or = 2 CHD risk factors. SABRA LDL-C is now calculated using the Martin-Hopkins  calculation, which is a validated novel method  providing  better accuracy than the Friedewald equation in the  estimation of LDL-C.  [name] et al. SANDREA. 7986;689(80): 2061-2068  (http://education.QuestDiagnostics.com/faq/FAQ164)    Direct LDL  Date Value Ref Range Status  01/22/2024 20 <100 mg/dL Final    Comment:    Greatly elevated Triglycerides values (>1200 mg/dL) interfere with the dLDL assay. SABRA Desirable range <100 mg/dL for primary prevention;   <70 mg/dL for patients with CHD or diabetic patients  with > or = 2 CHD risk factors. SABRA    HDL Cholesterol  Date Value Ref Range Status  07/27/2012 34 (L) 40 - 60 mg/dL Final   HDL  Date Value Ref Range Status  01/12/2024 35 (L) > OR = 40 mg/dL Final   Triglycerides  Date Value Ref Range Status  01/12/2024 525 (H) <150 mg/dL Final    Comment:    . If a non-fasting specimen was collected, consider repeat triglyceride testing on a fasting specimen if clinically indicated.  Veatrice et al. J. of Clin. Lipidol. 2015;9:129-169. . . There is increased risk of pancreatitis when the  triglyceride concentration is very high  (> or = 500 mg/dL, especially if > or = 8999 mg/dL).  Veatrice et al. J. of Clin. Lipidol. 2015;9:129-169. .   07/27/2012 212 (H) 0 - 200 mg/dL Final

## 2024-02-09 ENCOUNTER — Ambulatory Visit

## 2024-02-09 DIAGNOSIS — E538 Deficiency of other specified B group vitamins: Secondary | ICD-10-CM | POA: Diagnosis not present

## 2024-02-09 MED ORDER — CYANOCOBALAMIN 1000 MCG/ML IJ SOLN
1000.0000 ug | Freq: Once | INTRAMUSCULAR | Status: AC
  Administered 2024-02-09: 1000 ug via INTRAMUSCULAR

## 2024-02-12 NOTE — Progress Notes (Signed)
 Kevin Perry                                          MRN: [id number]   02/12/2024   The VBCI Quality Team Specialist reviewed this patient medical record for the purposes of chart review for care gap closure. The following were reviewed: abstraction for care gap closure-kidney health evaluation for diabetes:eGFR  and uACR.    VBCI Quality Team

## 2024-02-16 ENCOUNTER — Ambulatory Visit

## 2024-02-23 ENCOUNTER — Ambulatory Visit

## 2024-02-23 DIAGNOSIS — E538 Deficiency of other specified B group vitamins: Secondary | ICD-10-CM

## 2024-02-23 MED ORDER — CYANOCOBALAMIN 1000 MCG/ML IJ SOLN
1000.0000 ug | Freq: Once | INTRAMUSCULAR | Status: AC
  Administered 2024-02-23: 1000 ug via INTRAMUSCULAR

## 2024-03-01 ENCOUNTER — Ambulatory Visit

## 2024-03-03 ENCOUNTER — Ambulatory Visit (INDEPENDENT_AMBULATORY_CARE_PROVIDER_SITE_OTHER)

## 2024-03-03 DIAGNOSIS — E538 Deficiency of other specified B group vitamins: Secondary | ICD-10-CM | POA: Diagnosis not present

## 2024-03-03 MED ORDER — CYANOCOBALAMIN 1000 MCG/ML IJ SOLN
1000.0000 ug | Freq: Once | INTRAMUSCULAR | Status: AC
  Administered 2024-03-03: 1000 ug via INTRAMUSCULAR

## 2024-04-09 ENCOUNTER — Ambulatory Visit: Payer: PPO

## 2024-04-14 ENCOUNTER — Ambulatory Visit

## 2024-04-26 ENCOUNTER — Other Ambulatory Visit

## 2024-05-13 ENCOUNTER — Other Ambulatory Visit

## 2024-05-20 ENCOUNTER — Ambulatory Visit: Admitting: Family Medicine
